# Patient Record
Sex: Male | Born: 1940
Health system: Southern US, Community
[De-identification: ages and names within clinical notes are randomized; demographics above are authoritative.]

## PROBLEM LIST (undated history)

## (undated) DIAGNOSIS — N529 Male erectile dysfunction, unspecified: Secondary | ICD-10-CM

## (undated) DIAGNOSIS — D332 Benign neoplasm of brain, unspecified: Secondary | ICD-10-CM

## (undated) DIAGNOSIS — G610 Guillain-Barre syndrome: Secondary | ICD-10-CM

## (undated) DIAGNOSIS — E785 Hyperlipidemia, unspecified: Secondary | ICD-10-CM

## (undated) DIAGNOSIS — I639 Cerebral infarction, unspecified: Secondary | ICD-10-CM

## (undated) DIAGNOSIS — Z8639 Personal history of other endocrine, nutritional and metabolic disease: Secondary | ICD-10-CM

## (undated) DIAGNOSIS — I4891 Unspecified atrial fibrillation: Secondary | ICD-10-CM

## (undated) DIAGNOSIS — N183 Chronic kidney disease, stage 3 unspecified: Secondary | ICD-10-CM

## (undated) DIAGNOSIS — D649 Anemia, unspecified: Secondary | ICD-10-CM

## (undated) DIAGNOSIS — I499 Cardiac arrhythmia, unspecified: Secondary | ICD-10-CM

## (undated) DIAGNOSIS — R413 Other amnesia: Secondary | ICD-10-CM

## (undated) DIAGNOSIS — K219 Gastro-esophageal reflux disease without esophagitis: Secondary | ICD-10-CM

## (undated) DIAGNOSIS — I10 Essential (primary) hypertension: Secondary | ICD-10-CM

## (undated) DIAGNOSIS — F0390 Unspecified dementia without behavioral disturbance: Secondary | ICD-10-CM

## (undated) DIAGNOSIS — K5790 Diverticulosis of intestine, part unspecified, without perforation or abscess without bleeding: Secondary | ICD-10-CM

## (undated) DIAGNOSIS — I6529 Occlusion and stenosis of unspecified carotid artery: Secondary | ICD-10-CM

## (undated) DIAGNOSIS — I679 Cerebrovascular disease, unspecified: Secondary | ICD-10-CM

## (undated) HISTORY — DX: Essential (primary) hypertension: I10

## (undated) HISTORY — DX: Guillain-Barre syndrome: G61.0

## (undated) HISTORY — PX: INGUINAL HERNIA REPAIR: SUR1180

## (undated) HISTORY — DX: Cerebral infarction, unspecified: I63.9

## (undated) HISTORY — DX: Hyperlipidemia, unspecified: E78.5

## (undated) HISTORY — DX: Benign neoplasm of brain, unspecified: D33.2

## (undated) HISTORY — PX: TONSILLECTOMY: SUR1361

## (undated) HISTORY — DX: Other amnesia: R41.3

## (undated) HISTORY — DX: Diverticulosis of intestine, part unspecified, without perforation or abscess without bleeding: K57.90

## (undated) HISTORY — DX: Occlusion and stenosis of unspecified carotid artery: I65.29

## (undated) HISTORY — DX: Cerebrovascular disease, unspecified: I67.9

## (undated) HISTORY — DX: Male erectile dysfunction, unspecified: N52.9

## (undated) HISTORY — DX: Personal history of other endocrine, nutritional and metabolic disease: Z86.39

## (undated) HISTORY — DX: Chronic kidney disease, stage 3 (moderate): N18.3

## (undated) HISTORY — PX: EYE SURGERY: SHX253

## (undated) HISTORY — DX: Chronic kidney disease, stage 3 unspecified: N18.30

---

## 1969-01-15 HISTORY — PX: INGUINAL HERNIA REPAIR: SUR1180

## 1996-05-17 DIAGNOSIS — D332 Benign neoplasm of brain, unspecified: Secondary | ICD-10-CM

## 1996-05-17 HISTORY — DX: Benign neoplasm of brain, unspecified: D33.2

## 1996-05-17 HISTORY — PX: BRAIN SURGERY: SHX531

## 1997-05-17 DIAGNOSIS — G610 Guillain-Barre syndrome: Secondary | ICD-10-CM

## 1997-05-17 HISTORY — DX: Guillain-Barre syndrome: G61.0

## 1998-01-23 ENCOUNTER — Encounter: Payer: Self-pay | Admitting: Neurology

## 1998-01-23 ENCOUNTER — Inpatient Hospital Stay (HOSPITAL_COMMUNITY): Admission: AD | Admit: 1998-01-23 | Discharge: 1998-01-29 | Payer: Self-pay | Admitting: Neurology

## 2000-03-11 ENCOUNTER — Ambulatory Visit (HOSPITAL_COMMUNITY): Admission: RE | Admit: 2000-03-11 | Discharge: 2000-03-11 | Payer: Self-pay | Admitting: Gastroenterology

## 2000-03-11 ENCOUNTER — Encounter (INDEPENDENT_AMBULATORY_CARE_PROVIDER_SITE_OTHER): Payer: Self-pay | Admitting: *Deleted

## 2000-03-17 HISTORY — PX: CHOLECYSTECTOMY: SHX55

## 2000-03-30 ENCOUNTER — Encounter: Admission: RE | Admit: 2000-03-30 | Discharge: 2000-03-30 | Payer: Self-pay | Admitting: General Surgery

## 2000-03-30 ENCOUNTER — Encounter: Payer: Self-pay | Admitting: General Surgery

## 2000-04-11 ENCOUNTER — Encounter: Payer: Self-pay | Admitting: General Surgery

## 2000-04-14 ENCOUNTER — Encounter (INDEPENDENT_AMBULATORY_CARE_PROVIDER_SITE_OTHER): Payer: Self-pay | Admitting: Specialist

## 2000-04-14 ENCOUNTER — Inpatient Hospital Stay (HOSPITAL_COMMUNITY): Admission: RE | Admit: 2000-04-14 | Discharge: 2000-04-18 | Payer: Self-pay | Admitting: General Surgery

## 2001-05-17 DIAGNOSIS — I639 Cerebral infarction, unspecified: Secondary | ICD-10-CM

## 2001-05-17 HISTORY — DX: Cerebral infarction, unspecified: I63.9

## 2001-06-17 HISTORY — PX: COLON SURGERY: SHX602

## 2001-07-14 ENCOUNTER — Encounter (INDEPENDENT_AMBULATORY_CARE_PROVIDER_SITE_OTHER): Payer: Self-pay | Admitting: Specialist

## 2001-07-14 ENCOUNTER — Ambulatory Visit (HOSPITAL_COMMUNITY): Admission: RE | Admit: 2001-07-14 | Discharge: 2001-07-14 | Payer: Self-pay | Admitting: Gastroenterology

## 2001-09-09 ENCOUNTER — Inpatient Hospital Stay (HOSPITAL_COMMUNITY): Admission: EM | Admit: 2001-09-09 | Discharge: 2001-09-14 | Payer: Self-pay | Admitting: *Deleted

## 2001-09-09 ENCOUNTER — Encounter (INDEPENDENT_AMBULATORY_CARE_PROVIDER_SITE_OTHER): Payer: Self-pay | Admitting: Specialist

## 2001-09-09 ENCOUNTER — Encounter: Payer: Self-pay | Admitting: Emergency Medicine

## 2001-09-09 DIAGNOSIS — I679 Cerebrovascular disease, unspecified: Secondary | ICD-10-CM

## 2001-09-09 HISTORY — DX: Cerebrovascular disease, unspecified: I67.9

## 2001-09-13 HISTORY — PX: CAROTID ENDARTERECTOMY: SUR193

## 2001-10-04 ENCOUNTER — Encounter: Admission: RE | Admit: 2001-10-04 | Discharge: 2002-01-02 | Payer: Self-pay | Admitting: Internal Medicine

## 2007-06-09 ENCOUNTER — Ambulatory Visit: Payer: Self-pay | Admitting: Vascular Surgery

## 2008-06-14 ENCOUNTER — Ambulatory Visit: Payer: Self-pay | Admitting: Vascular Surgery

## 2009-05-23 ENCOUNTER — Ambulatory Visit: Payer: Self-pay | Admitting: Vascular Surgery

## 2009-07-04 ENCOUNTER — Encounter: Admission: RE | Admit: 2009-07-04 | Discharge: 2009-07-04 | Payer: Self-pay | Admitting: Vascular Surgery

## 2009-07-04 ENCOUNTER — Ambulatory Visit: Payer: Self-pay | Admitting: Vascular Surgery

## 2010-07-08 ENCOUNTER — Other Ambulatory Visit (INDEPENDENT_AMBULATORY_CARE_PROVIDER_SITE_OTHER): Payer: Medicare Other

## 2010-07-08 ENCOUNTER — Ambulatory Visit: Payer: Self-pay | Admitting: Vascular Surgery

## 2010-07-08 ENCOUNTER — Other Ambulatory Visit: Payer: Self-pay

## 2010-07-08 ENCOUNTER — Ambulatory Visit (INDEPENDENT_AMBULATORY_CARE_PROVIDER_SITE_OTHER): Payer: Medicare Other | Admitting: Vascular Surgery

## 2010-07-08 DIAGNOSIS — Z48812 Encounter for surgical aftercare following surgery on the circulatory system: Secondary | ICD-10-CM

## 2010-07-08 DIAGNOSIS — I6529 Occlusion and stenosis of unspecified carotid artery: Secondary | ICD-10-CM

## 2010-07-09 NOTE — Assessment & Plan Note (Signed)
OFFICE VISIT  TORINO, GORDEN DOB:  Nov 04, 1940                                       07/08/2010 [id number]  The patient presents today for follow-up of extracranial cerebrovascular occlusive disease.  He is status post left carotid endarterectomy in April 2003.  He had been seen 1 year ago with a duplex suggesting some evidence of significant stenosis in his endarterectomy site in the left carotid.  He does have known right internal carotid artery occlusion. He underwent a CT angiogram last year showing that he had moderate 50% stenosis in his common carotid artery junction with the plaque.  He is here today for follow-up of this.  He has had no evidence of any symptoms, specifically no amaurosis fugax, transient ischemic attack or stroke.  He tends to be stable with no cardiac difficulty.  PHYSICAL EXAMINATION:  Well-developed, well-nourished white male in no acute distress.  I do not appreciate carotid bruits.  He has a well- healed left neck incision.  He has palpable pulses bilaterally.  His radial pulses are 2+ bilaterally.  Blood pressure 130/71, pulse 72, respirations 18.  He did undergo repeat carotid duplex and this shows no change from the study 1 year ago.  This does show elevated velocities.  I explained that he does have elevated velocities but these are the same as last year with presumed no change.  I again reviewed symptoms of carotid disease with him and he will notify me should this occur.  Otherwise we will see him again in 1 year with repeat duplex.    Rosetta Posner, M.D. Electronically Signed  TFE/MEDQ  D:  07/08/2010  T:  07/09/2010  Job:  [id number]  cc:   Delanna Ahmadi, M.D.

## 2010-07-14 NOTE — Procedures (Unsigned)
CAROTID DUPLEX EXAM  INDICATION:  Left carotid endarterectomy followup, right ICA known occlusion.  HISTORY: Diabetes:  No Cardiac:  No Hypertension:  Yes Smoking:  No Previous Surgery:  Left carotid endarterectomy 09/13/2001 CV History:  Currently asymptomatic Amaurosis Fugax No, Paresthesias No, Hemiparesis No                                      RIGHT             LEFT Brachial systolic pressure: Brachial Doppler waveforms: Vertebral direction of flow:                          Antegrade DUPLEX VELOCITIES (cm/sec) CCA peak systolic                                     M = 108, B = 123456 ECA peak systolic                                     [id number] ICA peak systolic                                     80 ICA end diastolic                                     24 PLAQUE MORPHOLOGY:                                    Mixed PLAQUE AMOUNT:                                        Moderate/severe PLAQUE LOCATION:                                      ECA, CCA  IMPRESSION: 1. Known occlusion of the right internal carotid artery. 2. Patent left carotid endarterectomy site with elevated velocity of     371 cm/sec noted at the proximal patch level of the left common     carotid artery. 3. No left internal carotid artery stenosis noted.      ___________________________________________ Rosetta Posner, M.D.  EM/MEDQ  D:  07/09/2010  T:  07/09/2010  Job:  [id number]

## 2010-09-29 NOTE — Procedures (Signed)
CAROTID DUPLEX EXAM   INDICATION:  Follow up carotid artery disease.   HISTORY:  Diabetes:  No.  Cardiac:  No.  Hypertension:  Yes.  Smoking:  Previous.  Previous Surgery:  Left carotid endarterectomy on 09/13/01.  CV History:  Currently asymptomatic.  Amaurosis Fugax No, Paresthesias No, Hemiparesis No.                                       RIGHT             LEFT  Brachial systolic pressure:         116               122  Brachial Doppler waveforms:         Normal            Normal  Vertebral direction of flow:        Antegrade         Antegrade  DUPLEX VELOCITIES (cm/sec)  CCA peak systolic                   108               0000000  ECA peak systolic                   201               0000000  ICA peak systolic                   Occluded          [id number]  ICA end diastolic                                     18  PLAQUE MORPHOLOGY:                  Mixed             Mixed  PLAQUE AMOUNT:                      Occlusive         Moderate  PLAQUE LOCATION:                    ICA/ECA/CCA       ECA/CCA   IMPRESSION:  1. Known occlusion of the right internal carotid artery.  2. Doppler velocities suggest a 40-59% stenosis of the left internal      carotid artery; however, no focal plaque formation or flow      narrowing is noted.  This increase in velocity may be due to      compensatory flow.  3. Significantly increased velocity of the left distal common carotid      artery noted.  4. No significant change noted when compared to the previous      examination on 06/09/07.   ___________________________________________  Rosetta Posner, M.D.   CH/MEDQ  D:  06/14/2008  T:  06/14/2008  Job:  [id number]   cc:   Delanna Ahmadi, M.D.

## 2010-09-29 NOTE — Assessment & Plan Note (Signed)
OFFICE VISIT   [name], [name]  DOB:  18-Mar-1941                                       05/23/2009  CHART#:[id number]   The patient returns today for followup of a left carotid endarterectomy  which was performed in 2003.  He is followed on a yearly basis.  At this  time he is complaining of no symptoms.   PAST MEDICAL HISTORY:  Hypertension, hypercholesterolemia, which are  stable.   SOCIAL HISTORY:  He is married and retired.  He has a remote history of  tobacco use.   REVIEW OF SYSTEMS:  Significant for a stroke 7 years prior.   PHYSICAL FINDINGS:  General:  Revealed a well-nourished man in no acute  distress.  Vital signs:  Heart rate was 66, blood pressure 121/67, O2  sat 98%, temperature 97.4.  HEENT:  PERRLA.  EOMI.  Lungs:  Were clear  to auscultation.  Neck:  I did appreciate a left carotid bruit.  Cardiac:  Revealed a regular rate and rhythm.  Musculoskeletal:  System  demonstrated no major deformities.  Neuro:  He was neurologically  nonfocal.   He did undergo Doppler evaluation of his carotids which demonstrated a  known occlusion of the right internal carotid artery.  The left carotid  artery demonstrated an elevated velocity at 354 cm/s noted at the  proximal patch level of the left common carotid artery.  No internal  carotid artery stenosis was appreciated.  This demonstrated a  significant increase in the velocity of the left common carotid artery  since his previous study on 06/14/2008.   MEDICAL DECISION MAKING:  This was discussed with Dr. Donnetta Hutching.  We felt  that he should undergo CTA of his neck to further evaluate his left  common carotid artery.  He will have this done and follow up with Dr.  Donnetta Hutching in the next 3-4 weeks.   Chad Cordial, PA   Rosetta Posner, M.D.  Electronically Signed   KEL/MEDQ  D:  05/23/2009  T:  05/24/2009  Job:  [id number]

## 2010-09-29 NOTE — Assessment & Plan Note (Signed)
OFFICE VISIT   [name], [name]  DOB:  Mar 30, 1941                                       07/04/2009  CHART#:[id number]   Patient presents today for continued follow-up of his asymptomatic  carotid disease.  He is status post left carotid endarterectomy for  symptomatic carotid disease in 2003.  He had undergone continued duplex  follow-up in our office and had recently shown progression to a higher  degree of narrowing in his left common carotid at the proximal portion  of the patch.  He has a known right internal carotid artery occlusion.  He has remained stable from a cardiac standpoint and is undergoing  treatment for hypertension and elevated cholesterol.  He continues to be  a nonsmoker, having quit in 1998.  Review of systems otherwise  unremarkable.   PHYSICAL EXAMINATION:  Blood pressure is 121/71, pulse 68, respirations  24.  In general, no distress and is well nourished.  HEENT is normal.  Chest is clear bilaterally.  His carotid arteries are without bruits  bilaterally.  Musculoskeletal:  There are no major deformities or  cyanosis.  Neurologic:  He is grossly intact.  Skin is without ulcers or  rashes.   He did undergo a CT angiogram today, and I have reviewed this with him.  This does show moderate stenosis at the proximal portion of his patch on  his common carotid artery and the junction with a proximal patch.  This  is not critical with reduction of narrowing at approximately 50%.   I have discussed this at length with patient and his wife.  I have  recommend that we continue to see him on a yearly basis now that we have  a comparison between the duplex and his current level of stenosis.  He  will notify us should he develop any neurologic deficits.     Rosetta Posner, M.D.  Electronically Signed   TFE/MEDQ  D:  07/04/2009  T:  07/07/2009  Job:  [id number]   cc:   Delanna Ahmadi, M.D.

## 2010-09-29 NOTE — Procedures (Signed)
CAROTID DUPLEX EXAM   INDICATION:  Carotid disease.   HISTORY:  Diabetes:  No.  Cardiac:  No.  Hypertension:  Yes.  Smoking:  Previous.  Previous Surgery:  Left carotid endarterectomy on 09/13/2001.  CV History:  Asymptomatic.  Amaurosis Fugax No, Paresthesias No, Hemiparesis No                                       RIGHT             LEFT  Brachial systolic pressure:         122               118  Brachial Doppler waveforms:         Normal            Normal  Vertebral direction of flow:        Antegrade         Antegrade  DUPLEX VELOCITIES (cm/sec)  CCA peak systolic                   73                [id number]  ECA peak systolic                   220               [id number]  ICA peak systolic                   Occluded          66  ICA end diastolic                                     15  PLAQUE MORPHOLOGY:                  Mixed             Mixed  PLAQUE AMOUNT:                      Occlusive         Moderate / severe  PLAQUE LOCATION:                    ICA / ECA / CCA   ECA / CCA   IMPRESSION:  1. Known occlusion of the right internal carotid artery.  2. Patent left carotid endarterectomy site with elevated velocity of      354 cm/s noted at the proximal patch level of the left common      carotid artery.  No left internal carotid artery stenosis is noted.  3. Significant increase in velocities of the left distal common      carotid artery when compared to previous exam on 06/14/2008 with      the right side remaining stable.   ___________________________________________  Rosetta Posner, M.D.   CH/MEDQ  D:  05/23/2009  T:  05/24/2009  Job:  [id number]

## 2010-09-29 NOTE — Procedures (Signed)
CAROTID DUPLEX EXAM   INDICATION:  Followup, carotid artery disease.   HISTORY:  Diabetes:  No.  Cardiac:  No.  Hypertension:  Yes.  Smoking:  Quit in 1998.  Previous Surgery:  Left carotid endarterectomy with saphenous vein patch  on 09/13/01 by Dr. Donnetta Hutching.  CV History:  Amaurosis Fugax No, Paresthesias No, Hemiparesis No                                       RIGHT             LEFT  Brachial systolic pressure:         110               120  Brachial Doppler waveforms:         Biphasic          Biphasic  Vertebral direction of flow:        Antegrade         Antegrade  DUPLEX VELOCITIES (cm/sec)  CCA peak systolic                   124               123456  ECA peak systolic                   182               [id number]  ICA peak systolic                   Occluded          [id number]  ICA end diastolic                                     14  PLAQUE MORPHOLOGY:                  Calcified         Mixed  PLAQUE AMOUNT:                      Large             Large  PLAQUE LOCATION:                    ICA, ECA          Proximal patch,  ECA   IMPRESSION:  1. Bilateral external carotid artery stenoses.  2. Known right internal carotid artery occlusion.  3. Elevated velocities in the proximal left patch were 323 cm/s peak      systolic and 44 cm/s end diastolic, which is stable from previous      examination.   ___________________________________________  [name], M.D.   DP/MEDQ  D:  06/09/2007  T:  06/09/2007  Job:  [id number]

## 2010-10-02 NOTE — Consult Note (Signed)
[place]. [facility]  Patient:    [name], [name] Visit Number: [id number] MRN: [id number]          Service Type: MED Location: [phone] Attending Physician:  Irven Shelling Dictated by:   Chauncey Cruel Jeannine Kitten, M.D. Admit Date:  09/09/2001 Discharge Date: 09/14/2001                            Consultation Report  BIRTHDATE:  [birth year]  PROCEDURE:  Interpretation of right common carotid arteriogram (intracranial, two views), left subclavian arteriogram (intracranial, two views), and left common carotid arteriogram (intracranial, two views).  Procedure was performed by Dr. Donnetta Hutching and Dr. Allean Found.  CLINICAL HISTORY:  Episode of right-sided weakness.  The patient has had prior surgery involving the left calvarium for what is reported as a benign brain lesion in 1998.  The present examination is limited with respect to interpretation at this level because of artifact.  Further delineation with CT or MR may be considered.  RIGHT COMMON CAROTID ARTERIOGRAM (INTRACRANIAL, TWO VIEWS):  As the right internal artery is occluded, only the right external carotid artery is filled initially.  Via collaterals from the ophthalmic region, there is attempted filling of the right internal carotid artery; however, this is minimal in degree.  LEFT SUBCLAVIAN ARTERIOGRAM (INTRACRANIAL, TWO VIEWS):  During the injection of the left subclavian artery, the basilar artery fills intracranially.  There is filling of the right middle cerebral artery via right posterior communicating artery.  LEFT COMMON CAROTID ARTERY (INTRACRANIAL, TWO VIEWS):  During injection of the left common carotid artery, there is initially filling of the left extracranial artery branches.  Eventually, there is slight filling of the left internal carotid artery, consistent with the high-grade proximal stenosis.  IMPRESSION: 1. Right internal carotid artery is occluded.  Minimal filling via  collaterals from the external carotid artery. With injection of the vertebral system, there is filling of right middle cerebral artery branches from the basilar artery via posterior communicating artery. 2. Left internal carotid artery with significant high-grade stenosis with delayed poor filling of the left internal carotid artery during left common carotid artery injection.  For further delineation of any suspected intracranial lesion in this patient who has had prior craniotomy, CT or MR recommended. Dictated by:   S. Jeannine Kitten, M.D. Attending Physician:  Irven Shelling DD:  09/12/01 TD:  09/13/01 Job: [id number]

## 2010-10-02 NOTE — Consult Note (Signed)
[place]. [facility]  Patient:    [name], [name] Visit Number: [id number] MRN: [id number]          Service Type: MED Location: [id number] Attending Physician:  Irven Shelling Dictated by:   Nelda Severe Kellie Simmering, M.D. Proc. Date: 09/11/01 Admit Date:  09/09/2001                            Consultation Report  CHIEF COMPLAINT:  Left brain transient ischemic attack 48 hours ago.  HISTORY OF PRESENT ILLNESS:  I appreciate seeing this 70 year old, Caucasian, male patient in consultation regarding his left brain TIA which occurred two days ago.  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  He denies any previous episodes of similar symptoms on either side and also denies episodes of amaurosis fugax or previous stroke.  He also had no chest pain, dyspnea, nausea, vomiting, or diaphoresis during these symptoms.  PAST MEDICAL HISTORY:  Significant for: 1. Previous benign brain tumor removed from the left hemisphere in 1998    (question meningioma). 2. Postoperative wound infection following benign brain tumor removal. 3. Guillain-Barre syndrome in 1999. 4. Colonic polyps removed endoscopically in the past. 5. Hypertension. 6. Increased lipids.  He denies a history of emphysema, asthma, coronary artery disease, previous myocardial infarction, or previous CVA.  MEDICATIONS: 1. Zocor 20 mg daily. 2. Norvasc 10 mg daily. 3. Aspirin 81 mg daily. 4. ______/hydrochlorothiazide 10/12.5 mg daily.  ALLERGIES:  Questionable HEPARIN, rash.  SOCIAL HISTORY:  The patient is disabled.  He has not smoked in five years. Prior to that he smoked a pack a day at least for the past 30 years.  Denies alcohol use.  FAMILY HISTORY:  His father died of a CVA at age 83.  His mother died of metastatic colon cancer.  PHYSICAL  EXAMINATION:  Blood pressure 140/68, heart rate 82, respirations 20.  GENERAL APPEARANCE:  He is is a healthy-appearing male patient who is in no apparent distress.  He is alert and oriented x 3.  NECK:  Supple with 3+ carotid pulses.  No palpable adenopathy in the neck.  NEUROLOGIC:  Normal.  EXTREMITIES:  Upper extremity pulses are 3+ bilaterally.  CHEST:  Clear to auscultation.  CARDIOVASCULAR:  Regular rhythm with no murmur.  ABDOMEN:  Soft and obese with no masses or tenderness.  EXTREMITIES:  3+ femoral pulses bilaterally.  The right popliteal and right distal pulses are not palpable.  The left leg has 2+ popliteal and a 1-2+ posterior tibial pulses with no evidence of ischemia.  LABORATORY DATA:  A CT scan at the time of admission revealed no acute changes with no evidence of any hemorrhage.  Carotid duplex exam done today reveals left internal carotid occlusion and severe right internal carotid stenosis versus distal occlusion.  IMPRESSION: 1. Left hemispheric transient ischemic attack secondary to left internal    carotid occlusion and severe right internal carotid stenosis. 2. Rule out heparin allergy. 3. History of left brain tumor, benign. 4. History of Guillain-Barre syndrome.  RECOMMENDATIONS: 1. Plan cerebral angiography tomorrow. 2. Needs a hematology consult today to determine the extent of heparin allergy    and what can be used surgically in place of heparin or if the heparin    allergy is confined only to beef or pork varieties.  I appreciate  seeing this consultation. Dictated by:   Nelda Severe Kellie Simmering, M.D. Attending Physician:  Irven Shelling DD:  09/11/01 TD:  09/11/01 Job: [id number]

## 2010-10-02 NOTE — Consult Note (Signed)
[place]. [facility]  Patient:    [name], [name] Visit Number: [id number] MRN: [id number]          Service Type: MED Location: [id number] Attending Physician:  [name] Dictated by:   [name], N.P. Proc. Date: 09/11/01 Admit Date:  09/09/2001   CC:         [name], M.D.  [name] [name], M.D.   Consultation Report  REFERRING PHYSICIAN:  Dr. Laurann Johnson  DATE OF BIRTH:  Oct 23, 1940  REASON FOR CONSULTATION:  Questionable heparin allergy.  HISTORY OF PRESENT ILLNESS:  The patient is a 70 year old gentleman who was admitted September 09, 2001, with a TIA and was found to have left ICA occlusion and probable Johnson right ICA stenosis in need of right CEA. He is scheduled for cerebral angiogram September 12, 2001. The patient reports a possible heparin allergy. He states that he experienced immediate facial flushing following a heparin flush to a PICC line in 1998. He reports the heparin was discontinued, and further flushes were performed with saline. He denies having any heparin since that possible reaction in 1998.  PAST MEDICAL HISTORY: 1. Benign brain tumor, status post resection March 1998. 2. Guillain Barre syndrome, 1999. 3. History of multiple colon polyps, status post right colectomy, November    2001 (colonoscopy February 2003 with four benign polyps removed). 4. Status post laparoscopic cholecystectomy, November 2001. 5. Hypertension. 6. Hyperlipidemia. 7. Diverticulosis. 8. Status post inguinal hernia repair in 1970s.  CURRENT MEDICATIONS: 1. Plavix 75 mg daily. 2. Zocor 20 mg daily. 3. Lisinopril 10 mg daily. 4. Hydrochlorothiazide 12.5 mg daily. 5. Norvasc 10 mg daily.  ALLERGIES:  Question to HEPARIN.  FAMILY HISTORY:  Mother deceased age 60 with colon cancer, father deceased with a stroke. He has no siblings.  SOCIAL HISTORY:  The patient lives in [place] with his wife. They have  one adopted son. The patient reports he is disabled. 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 He has a positive tobacco history reporting that he quit smoking in 1998 at two packs per day for approximately 35 years. He denies any ETOH use.  REVIEW OF SYSTEMS:  The patient denies any weight loss or anorexia. He denies any fatigue. He has had no fevers. He denies any pain. He has had no unusual headaches or vision changes. He denies any shortness of breath or cough. He has had no chest pain. He denies any peripheral edema. He has had no recent change in his bowel habits. He reports that he occasionally notices a small amount of bright red blood on toilet paper after a bowel movement. He reports that this has been occurring since his bowel surgery in November of 2001. He denies any melena. He has had no hematuria or dysuria.  PHYSICAL EXAMINATION:  VITAL SIGNS:  Temperature 97.1, heart rate 76, respirations 20, blood pressure 138/68. Oxygen saturation 97% on room air.  GENERAL:  Well-nourished Caucasian male in no acute distress.  HEENT:  Normocephalic, atraumatic. Pupils equal and reactive to light. Extraocular muscles are intact, sclerae anicteric. Oropharynx is clear.  NECK:  No bruit noted.  LYMPH:  No palpable cervical, supraclavicular, or axillary lymph nodes.  CHEST:  Lungs are clear bilaterally.  CARDIOVASCULAR:  Regular rate and rhythm, 2/6 systolic murmur, left sternal border.  ABDOMEN:  Soft and nontender.  EXTREMITIES:  No cyanosis, clubbing, or edema. DT pulse 2+ on the  left, not palpable on the right.  NEUROLOGIC:  Alert and oriented x3. Motor strength is 5/5. Coordination and rapid alternating movements normal.  LABORATORY DATA:  Hemoglobin 13.8, white count 10.4, platelets 202,000, sodium 139, potassium 4.1, BUN 19, creatinine 1.3, glucose 97, total bilirubin 0.8, alkaline phosphatase 90, SGOT 22, SGPT 27, total  protein 6.4, albumin 30.6, calcium 8.9. PT 13.7, PTT 33.  RADIOLOGY:  Head CT:  Evidence for previous left temporoparietal craniectomy with large area of encephalomalacia seen within the left frontal temporal region. No acute abnormalities.  IMPRESSION:  This case revolves around whether or not the patient has a true heparin allergy. He relays a history of flushing following a bolus heparin flush approximately five years ago. Vasospastic reactions have been described with heparin, usually arterial catheters and delayed by 7-10 days. Current heparin product is derived from pigs. Bovine product is no longer available. The event five years ago was not recorded on his Discharge Summary. There is no note of thrombocytopenia which is the more common heparin reaction. The episode occurred shortly after a PICC line was placed and may have been vasovagal. We have a low clinical suspicion for a true heparin allergy.  RECOMMENDATIONS: 1. Subcutaneous test dose of heparin 100 units. Monitoring vital signs every    20 minutes for one hour. If this is tolerated, then a small IV dose of    heparin (100 units). If this is tolerated, full dose heparin. 2. Would not use a direct thrombin inhibitor (i.e. lepirudin or argatroban)    to cover a cerebrovascular procedure since there is no way to reverse    drug for bleeding. 3. A possible alternative to heparin is low molecular weight dextran 40    125-250 mL IV over one hour every 12 hours.  Dr. Beryle Johnson discussed the above with the patient and his wife. He does not object to a test dose of heparin.  We will continue to follow with you. Dictated by:   [name], N.P. Attending Physician:  [name] DD:  09/13/01 TD:  09/13/01 Job: [id number]

## 2010-10-02 NOTE — Discharge Summary (Signed)
[place]. [facility]  Patient:    KENDRYCK, GALANTE Visit Number: [id number] MRN: [id number]          Service Type: MED Location: [phone] Attending Physician:  Irven Shelling Dictated by:   Delanna Ahmadi, M.D. Admit Date:  09/09/2001 Discharge Date: 09/14/2001   CC:         Rosetta Posner, M.D.   Discharge Summary  HISTORY OF PRESENT ILLNESS: The patient is a 70 year old white male who was admitted with an episode of right sided numbness and weakness of about 20 minutes in duration, presumably secondary to a left brain TIA.  PHYSICAL EXAM:  VITAL SIGNS: Blood pressure 148/68. Heart rate 82. Respirations 20. Temperature 97.1.  The remainder of physical examination including his neurologic examination was normal.  LABORATORY DATA: CBC revealed WBC 12.3, hemoglobin 14.6. Platelet count 209. PT 13.7, PTT 33. Chemistry revealed sodium of 139, potassium 4.1, chloride 104, bicarb 28, glucose 98, BUN 19, creatinine 1.3, calcium 8.9, total protein 6.4, albumin 3.6. AST 22, ALT 27, alkaphos 90, total bilirubin 0.8  DIAGNOSTIC STUDIES: CT scan of the brain showed a previous left temporal parietal craniectomy with a large area of encephalomalacia but no acute abnormalities.  EKG was normal sinus rhythm.  HOSPITAL COURSE:  #1 - LEFT BRAIN TIA: The patient was admitted with a left brain TIA. He was placed on aspirin and Plavix.  A carotid ultrasound was done on September 11, 2001 and showed possible complete occlusion of the left internal carotid artery and near total occlusion of the right internal carotid artery. An echocardiogram was done on the same day and showed normal LV function, mild MR, and mild increase in left ventricular wall thickness.  Vascular surgery was consulted. There was concern of a possible reaction to Heparin in the past, so Dr. Beryle Beams of Hematology was consulted also. Cerebral angiogram was done and showed complete  occlusion of the right internal carotid artery and a greater than 95% stenosis in the left internal carotid artery. A left carotid endarterectomy was recommended by CVTS. Dr. Beryle Beams recommended a Heparin challenge with small doses of subcuticular and then IV Heparin which were given to the patient without adverse reaction. So the patient was able to be treated with Heparin at the time of surgery. On September 13, 2001, the patient underwent a left carotid endarterectomy with vein patch angioplasty, SVG from left lower leg by Dr. Donnetta Hutching. He had some postop hypotension which was treated with IV fluids and albumin and resolved. He had no evidence of any neurologic deficit postoperatively and has done well. He was discharged in good condition.  DISCHARGE DIAGNOSES: 1. Left brain transischemic attack. 2. Cerebrovascular disease. 3. Hypertension. 4. History of meningioma. 5. History of Guillain-Barre syndrome. 6. Hyperlipidemia.  PROCEDURES: 1. CT scan of the brain. 2. Carotid ultrasound. 3. Transthoracic echocardiogram. 4. Cerebral angiogram. 5. Left carotid endarterectomy with vein patch angioplasty.  DISCHARGE MEDICATIONS: 1. Zocor 20 mg q. pm. 2. Lisinopril/HCTZ 10/12.5 mg q.d. 3. Norvasc 10 mg q.d. 4. Aspirin 81 mg per day. 5. Plavix 75 mg per day.  DISCHARGE DIET: Low sodium.  ACTIVITY: As tolerated.  FOLLOW-UP: In two to three weeks with CVTS and two weeks with Dr. Laurann Montana. Dictated by:   Delanna Ahmadi, M.D. Attending Physician:  Irven Shelling DD:  09/14/01 TD:  09/16/01 Job: [id number]

## 2010-10-02 NOTE — Op Note (Signed)
[place] [facility]  Patient:    RICKARD, [name]                   MRN: [id number] Proc. Date: 04/14/00 Adm. Date:  [id number] Attending:  Barbera Setters CC:         Delanna Ahmadi, M.D.  Alyson Locket. Sammuel Cooper, M.D.   Operative Report  PREOPERATIVE DIAGNOSES: 1. Villous adenoma of the cecum. 2. Gallstones.  POSTOPERATIVE DIAGNOSES: 1. Villous adenoma of the cecum. 2. Gallstones.  OPERATIONS PERFORMED: 1. Laparoscopic-assisted right colectomy with ileocolic anastomosis. 2. Laparoscopic cholecystectomy.  SURGEON:  Edsel Petrin. Dalbert Batman, M.D.  FIRST ASSISTANT:  Odis Hollingshead, M.D.  OPERATIVE INDICATIONS:  This is a 70 year old white man who has multiple medical problems, including hypertension, Guillain-Barre syndrome, and a history of craniotomy for meningioma.  He was found to have colon polyps recently.  He had a full colonoscopy by Alyson Locket. Sammuel Cooper, M.D., which showed numerous polyps in the sigmoid colon, descending colon, transverse colon, and right colon.  All of the polyps in the transverse, descending, and sigmoid colon were snared and removed and were all adenomatous polyps.  The only polyp that could not be removed was a 3 cm sessile polyp in the cecum, which was too large and too sessile for a polypectomy.  A couple of other polyps in the ascending colon were apparently left behind.  The patient was referred for colectomy.  A preoperative CT scan showed multiple gallstones, no mass in the abdomen, and a 5 mm cystic lesion in the right lobe of the liver, which was felt to be benign.  The patient was brought to the operating room for attempt at laparoscopic colectomy and laparoscopic cholecystectomy.  OPERATIVE FINDINGS:  The patient was found to have four polyps in the cecum and proximal ascending colon.  Three of these polyps were spherical and about 5 mm in diameter.  Another polyp was about 2 cm long x about 6 mm wide and  was immediately adjacent to one of the other polyps, making it look like a larger polyp.  No other polyps were identified.  The liver and gallbladder appeared normal, except for some adhesions to the gallbladder.  There were multiple stones within the gallbladder.  The stomach and duodenum, large intestine, and small intestine were normal.  The omentum was quite large.  The mesentery of the terminal ileum was somewhat thickened, but benign in appearance.  The peritoneal surfaces were smooth and normal and there was no ascites.  OPERATIVE TECHNIQUE:  Following the induction of general endotracheal anesthesia, a Foley catheter was inserted and an orogastric tube was inserted. Marcaine 0.5% with epinephrine was used as a local infiltration anesthetic.  A transverse incision was made just above the upper rim of the umbilicus.  The fascia was incised in the midline and the abdominal cavity entered under direct vision.  A 10 mm Hasson trocar was inserted and secured with a pursestring suture of 0 Vicryl.  A pneumoperitoneum was created.  A video camera was inserted with visualization and findings as described above.  A 10 mm trocar was placed in the subxiphoid region just to the left of the falciform ligament.  Two 5 mm trocars were placed in the right mid abdomen and a 5 mm trocar placed in the left suprapubic area.  After surveying the abdomen, we elected to perform the cholecystectomy first.  The patient was positioned.  The gallbladder was placed on traction.  We dissected the peritoneum  off of the gallbladder.  We dissected out the cystic duct and the cystic artery.  We very carefully dissected the entire length of the cystic duct, creating a nice window behind the gallbladder.  We clearly identified the junction of the cystic duct with the gallbladder and clearly identified the region of the common bile duct.  The cystic duct was then secured with metal clips and divided.  The cystic  artery was secured with metal clips and divided.  The gallbladder was dissected from its bed with electrocautery and removed.  The operative field was irrigated.  Hemostasis was excellent in the bed of the gallbladder.  At the completion of this dissection and at the completion of the case, there was no bleeding and no bile whatsoever from the gallbladder bed.  The patient was then repositioned in Trendelenburg position.  We identified the terminal ileum, the appendix, the cecum, and the right colon.  Using the harmonic scalpel, we divided the lateral peritoneal attachments of the right colon, cecum, appendix, and terminal ileum.  With blunt dissection and using the ultrasonic dissector, we mobilized these structures off of the retroperitoneum to the midline.  Dissection was carried up and around the hepatic flexure.  We had good visualization of the dissection plane between the hepatic flexure of the colon and Gerotas fascia.  The right ureter and duodenum were not injured.  We never truly saw the ureter, but the duodenum looked fine.  After mobilizing the terminal ileum, right colon, and hepatic flexure, we felt that we had enough mobilization to bring the colon up through a wound.  The instruments were removed.  The pneumoperitoneum was released. We removed the trocar at the umbilicus.  A transverse incision was made about 6 cm in length starting just above the umbilicus and extending to the right lateral side.  The abdominal wall was traversed with electrocautery, dividing the medial rectus muscles with electrocautery.  The abdomen was entered. Retractors were placed.  We were able to easily pull the terminal ileum and right colon up into the wound quite nicely.  We palpated the cecum and could feel a palpable polyp.  We transected the terminal ileum about 4 inches proximal to the ileocecal valve using the GIA stapling device.  We transected the ascending colon just proximal to the  hepatic flexure.  Mesenteric vessels  were isolated, clamped, divided, and ligated with 2-0 silk ties.  Larger vessels were suture ligated with 2-0 silk suture ligatures.  We kept the mesenteric dissection very close to the colon wall to avoid injury to any retroperitoneal structures and because the disease process was felt to be benign.  We took the specimen to the side table and opened it up.  We found the four polyps in it that were described above.  We felt that this was consistent with the colonoscopic findings.  We felt that we had resected the disease process.  An anastomosis was created between the terminal ileum and the hepatic flexure of the colon using a GIA stapling device.  The defect left in the bowel wall was closed with a TA60 stapling device.  This provided a very secure closure. The bowel was pink and healthy on both sides, but there was no bleeding.  We then changed our instruments and gloves.  We closed the mesentery with a few interrupted sutures of 3-0 silk.  We irrigated the intestine and it looked fine.  We dropped the intestine back into the abdominal cavity.  The transverse lumen  was closed in layers.  The posterior rectus sheath was closed with a running suture of 1-0 PDS and the anterior rectus sheath was closed with a separate running suture of 1-0 PDS.  The wound was irrigated with saline and the skin was closed with skin staples.  Pneumoperitoneum was reestablished.  We positioned the patient in several positions and irrigated the subphrenic space, subhepatic space, right pericolic gutter, and pelvis with about 2 L of saline.  There was no bleeding whatsoever.  The anastomosis looked good and healthy.  We removed all of our trocars under direct vision.  There was no bleeding from the trocar sites.  The pneumoperitoneum was released.  The fascia had already been closed.  The skin incisions were closed with skin staples and Steri-Strips.  Clean bandages  were placed and the patient was taken to the recovery room in stable condition.  The estimated blood loss was about 100 cc or less.  No complications.  The sponge, needle, and instrument counts were correct. DD:  04/14/00 TD:  04/14/00 Job: [id number]

## 2010-10-02 NOTE — Op Note (Signed)
[place]. [facility]  Patient:    [name], [name] Visit Number: [id number] MRN: [id number]          Service Type: MED Location: [place] 09 Attending Physician:  Irven Shelling Dictated by:   Lilia Argue Servando Snare, M.D. Proc. Date: 09/13/01 Admit Date:  09/09/2001                             Operative Report  PREOPERATIVE DIAGNOSIS:  Symptomatic left internal carotid artery stenosis.  POSTOPERATIVE DIAGNOSIS:  Symptomatic left internal carotid artery stenosis.  OPERATION PERFORMED:  Carotid endarterectomy and saphenous vein patch angioplasty.  SURGEON:  Rosetta Posner, M.D.  ASSISTANT: 1. Nelda Severe. Kellie Simmering, M.D. 2. Rosealee Albee. Theda Sers, P.A.  ANESTHESIA:  General endotracheal.  COMPLICATIONS:  None.  DISPOSITION:  To recovery room neurologically intact.  DESCRIPTION OF PROCEDURE:  The patient was taken to the operating room and placed in supine position where the area of the left neck and right distal calf and ankle were prepped and draped in the usual sterile fashion.  Incision was made in the anterior sternocleidomastoid and carried down through the platysma with electrocautery.  The sternocleidomastoid reflected posteriorly and the facial vein was ligated with 2-0 silk ties and divided.  The common carotid artery was encircled with umbilical tape and Rumel tourniquet and dissection extended up onto the carotid bifurcation.  The superior thyroid artery was encircled with a 2-0 Potts silk tie.  The external carotid artery was encircled with a blue vessel loop.  The internal carotid artery was encircled with an umbilical tape and Rumel tourniquet.  A spinal accessory, hypoglossal and vagus nerves were all identified and preserved.  The right saphenous vein was then exposed at the ankle and harvested over approximately 6 cm.  This wound was closed with 3-0 and 4-0 subcuticular Vicryl sutures. The patient was then given 7000 units of intravenous  heparin.  After adequate circulation time, the internal, external and common carotid arteries were occluded.  The common carotid artery was opened with an 11 blade and extended longitudinally with Potts scissors through the plaque onto the internal carotid.  Arteriogram showed subtotal occlusion of the internal carotid with the small internal carotid artery above this.  This was a small caliber but normal-appearing artery and was gently dilated.  An 8 shunt was passed up the internal carotid artery, allowed to back-bleed, then down the common carotid where it was secured with a Rumel tourniquet.  Endarterectomy was begun on the common carotid and the plaque was divided proximally with Potts scissors. The endarterectomy was extended on to the bifurcation, the external carotid artery was endarterectomized with eversion technique and the internal carotid artery was endarterectomized in an open fashion.  The remaining atheromatous debris was removed from the endarterectomy plane.  The saphenous vein was used as a vein patch angioplasty with a running 6-0 Prolene suture.  Prior to completion of the anastomosis, the shunt was removed and the usual ____________ deairing maneuvers were undertaken.  The anastomosis was then completed and the external, followed by the common, followed by the internal carotid artery occlusion clamps were removed.  Excellent flow characteristics were noted with hand-held Doppler in the internal and external carotid arteries.  The patient was given 50 mg of Protamine to reverse the heparin.  The wounds were irrigated and hemostasis electrocautery.  Wounds closed with 3-0 Vicryl in the subcutaneous, subcuticular tissue.  Benzoin and Steri-Strips  were applied. The patient was awakened in the operating room neurologically intact and was transferred to the recovery room in stable condition. Dictated by:   Lilia Argue Servando Snare, M.D. Attending Physician:  Irven Shelling DD:  09/13/01 TD:  09/13/01 Job: [id number]

## 2010-10-02 NOTE — Discharge Summary (Signed)
[facility]  Patient:    [name], [name]                      MRN: [id number] Adm. Date:  04/14/00 Disc. Date: 04/18/00 Attending:  Edsel Petrin. Dalbert Batman, M.D. CC:         Delanna Ahmadi, M.D.  Alyson Locket. Sammuel Cooper, M.D.   Discharge Summary  FINAL DIAGNOSES: 1. Multiple adenomatous polyps of the cecum with benign villous features. 2. Chronic cholecystitis with cholelithiasis. 3. Hypertension. 4. History of Guillain Barre syndrome. 5. History of left craniotomy from an angioma.  OPERATION PERFORMED:  Laparoscopically assisted right colectomy and laparoscopic cholecystectomy on April 14, 2000.  HISTORY OF PRESENT ILLNESS:  This is a 70 year old white male with multiple medical problems as listed above. Recent screening colonoscopy showed numerous polyps in the sigmoid colon, descending colon, transverse colon, and right colon. All of these polyps were removed except for some polyps in the cecum which were large and sessile and could not be excised. The patient was sent to me by Dr. Cletis Athens and Dr. Lavone Orn for segmental colon resection because of the villous polyps in his cecum. He was felt to be a good candidate for laparoscopic colectomy. CT scan was done preoperatively and showed numerous gallstones and otherwise the CT scan looked fine. He underwent bowel prep at home and was brought to the hospital electively for his colectomy.  For details of his past history, family history, social history and review of systems, please see the detailed admission note.  PHYSICAL EXAMINATION:  GENERAL:  Over weight, middle aged gentleman in no distress.  HEENT:  Sclera clear, extraocular movements intact.  NECK:  Supple, nontender, no mass. Possibility of a faint left carotid bruit.  LUNGS:  Clear to auscultation.  HEART:  Regular rate and rhythm, no murmur.  ABDOMEN:  Somewhat obese, soft, nontender, no mass, no hernia.  EXTREMITIES:  No  edema. Femoral pulses easily felt. Pedal pulses not easily felt.  HOSPITAL COURSE:  On the day of admission, the patient was taken to the operating room. He underwent a laparoscopic right colectomy and a laparoscopic cholecystectomy. We found 4 polyps in the cecum, 3 small ones and 1 large one consistent with the colonoscopic findings. The gallbladder was full of gallstones and chronically inflamed.  Final pathology report showed 4 adenomatous polyps with focal villous features but no dysplasia or carcinoma noted. The appendix showed no tumor. Mesenteric lymph nodes were benign. The gallbladder showed chronic cholecystitis and cholelithiasis.  Postoperatively, the patient did extremely well. He progressed in his diet and activities rapidly. He was able to be discharged on the fourth postoperative day.  On April 18, 2000, he was discharged tolerating a regular diet and was having bowel movements, denied pain, wanted to go home. His abdomen was soft and his wound was healing uneventfully.  DISCHARGE MEDICATIONS:  Basically included his usual medications which are Norvasc 10 mg a day, Prinzide 10/12.5 1 tablet a day, and Tylenol for pain.  He was advised to return to see me in the office in 2 weeks. He was advised that he needs follow-up colonoscopy with Dr. Sammuel Cooper in 1 year. DD:  05/12/00 TD:  05/13/00 Job: [id number]

## 2010-10-02 NOTE — Procedures (Signed)
[place]. [facility]  Patient:    [name], [name] Visit Number: [id number] MRN: [id number]          Service Type: MED Location: [id number] Attending Physician:  Irven Shelling Dictated by:   Rosetta Posner, M.D. Proc. Date: 09/12/01 Admit Date:  09/09/2001   CC:         Nelda Severe. Kellie Simmering, M.D.   Procedure Report  PREOPERATIVE DIAGNOSIS:  Severe extracranial cerebrovascular occlusive disease.  POSTOPERATIVE DIAGNOSIS:  Severe extracranial cerebrovascular occlusive disease.  PROCEDURE:  Arch and cerebral arteriogram.  SURGEON:  Rosetta Posner, M.D.  ASSISTANT:  Mechele Collin, M.D.  ANESTHESIA:  MAC.  COMPLICATIONS:  None.  DISPOSITION:  To holding area stable.  DESCRIPTION OF PROCEDURE:  The patient was taken to the peripheral vascular catheterization lab and placed in the supine position, where the area of both the right and left groins was prepped and draped in the usual sterile fashion. Using local anesthesia and a single wall puncture, the right common femoral artery was entered and a guidewire was passed to the level of the aortic bifurcation.  A 5 French sheath was passed over this and using a pigtail catheter positioned over a Wholey wire, and this was placed in the ascending arch.  AP projections were undertaken.  This revealed a widely patent innominate, left carotid, and left subclavian takeoff.  The patient had a large dominant left vertebral and a moderate-sized right vertebral artery. Next using a Headhunter catheter, the innominate and then the right common carotid artery was selectively catheterized.  Carotid and intracranial and cervical injections were undertaken through the right carotid system.  This revealed a completely occluded internal carotid artery on the right.  There were poor extracranial and intracranial collaterals.  Initial interpretation of the intracranial views will be dictated in a separate note by  [facility].  There was a large external carotid with no evidence of stenosis. Next the Headhunter catheter was removed from the right common carotid over a Wholey wire.  The left subclavian artery was then selectively catheterized, and the left vertebral takeoff was viewed.  This was widely patent, and there was a dominant left vertebral artery.  Intracranial views through this left subclavian injection showed good cross-filling of the posterior circulation through the vertebral flow.  Next the left carotid, common carotid artery was selectively catheterized with the Headhunter catheter as well.  There was extensive plaque at the bifurcation and a subtotal occlusion at the bifurcation.  There was a small internal carotid artery that was patent above this level and did continue into the intracranial portion of the circulation. There were good extracranial to intracranial collaterals on the left injection, and these actually filled on the cine run before the antegrade flow through the internal carotid artery on the left.  The patient tolerated the procedure without immediate complication and was transferred to the holding area in stable condition. Dictated by:   Rosetta Posner, M.D. Attending Physician:  Irven Shelling DD:  09/12/01 TD:  09/13/01 Job: [id number]

## 2011-07-09 ENCOUNTER — Other Ambulatory Visit (INDEPENDENT_AMBULATORY_CARE_PROVIDER_SITE_OTHER): Payer: Medicare Other | Admitting: *Deleted

## 2011-07-09 DIAGNOSIS — Z48812 Encounter for surgical aftercare following surgery on the circulatory system: Secondary | ICD-10-CM | POA: Diagnosis not present

## 2011-07-09 DIAGNOSIS — I6529 Occlusion and stenosis of unspecified carotid artery: Secondary | ICD-10-CM

## 2011-07-16 DIAGNOSIS — I10 Essential (primary) hypertension: Secondary | ICD-10-CM | POA: Diagnosis not present

## 2011-07-16 DIAGNOSIS — N529 Male erectile dysfunction, unspecified: Secondary | ICD-10-CM | POA: Diagnosis not present

## 2011-07-30 ENCOUNTER — Other Ambulatory Visit: Payer: Self-pay | Admitting: *Deleted

## 2011-07-30 DIAGNOSIS — I6529 Occlusion and stenosis of unspecified carotid artery: Secondary | ICD-10-CM

## 2011-07-30 NOTE — Procedures (Unsigned)
CAROTID DUPLEX EXAM  INDICATION:  Left carotid endarterectomy follow-up, known right ICA occlusion.  HISTORY: Diabetes:  No. Cardiac:  No. Hypertension:  Yes. Smoking:  No. Previous Surgery:  Left carotid endarterectomy 09/13/2001. CV History:  Asymptomatic at this time. Amaurosis Fugax No, Paresthesias No, Hemiparesis No                                      RIGHT             LEFT Brachial systolic pressure:         130               130 Brachial Doppler waveforms:         Triphasic         Biphasic Vertebral direction of flow:        Antegrade         Antegrade DUPLEX VELOCITIES (cm/sec) CCA peak systolic                   155               0000000 ECA peak systolic                   236               [id number] ICA peak systolic                   Known occlusion   [id number] ICA end diastolic                                     18 PLAQUE MORPHOLOGY:                  Mixed             Mixed PLAQUE AMOUNT:                      Occluded          Moderate/severe            PLAQUE LOCATION:  ICA   CCA distal/proximal patch.  Patch:  Left 416/53   IMPRESSION: 1. Known right internal carotid artery occlusion. 2. Patent left carotid endarterectomy site with narrowing visualized     with elevated velocity of 416/53, increased since prior exam of     07/08/2010. 3. No hemo-significantly left internal carotid artery stenosis,     increased velocities may be transferred from increased velocities     of patch. 4. Vertebral artery flow antegrade bilaterally.  ___________________________________________ Rosetta Posner, M.D.  SS/MEDQ  D:  07/09/2011  T:  07/09/2011  Job:  [id number]

## 2011-08-02 ENCOUNTER — Encounter: Payer: Self-pay | Admitting: Vascular Surgery

## 2012-01-20 DIAGNOSIS — I10 Essential (primary) hypertension: Secondary | ICD-10-CM | POA: Diagnosis not present

## 2012-01-20 DIAGNOSIS — E785 Hyperlipidemia, unspecified: Secondary | ICD-10-CM | POA: Diagnosis not present

## 2012-01-20 DIAGNOSIS — Z0000 Encounter for general adult medical examination without abnormal findings: Secondary | ICD-10-CM | POA: Diagnosis not present

## 2012-01-20 DIAGNOSIS — Z1331 Encounter for screening for depression: Secondary | ICD-10-CM | POA: Diagnosis not present

## 2012-01-28 DIAGNOSIS — Z87891 Personal history of nicotine dependence: Secondary | ICD-10-CM | POA: Diagnosis not present

## 2012-05-15 ENCOUNTER — Encounter: Payer: Self-pay | Admitting: Neurosurgery

## 2012-07-10 ENCOUNTER — Encounter: Payer: Self-pay | Admitting: Neurosurgery

## 2012-07-11 ENCOUNTER — Other Ambulatory Visit: Payer: Self-pay | Admitting: *Deleted

## 2012-07-11 ENCOUNTER — Encounter: Payer: Self-pay | Admitting: Neurosurgery

## 2012-07-11 ENCOUNTER — Other Ambulatory Visit (INDEPENDENT_AMBULATORY_CARE_PROVIDER_SITE_OTHER): Payer: Medicare Other | Admitting: *Deleted

## 2012-07-11 ENCOUNTER — Ambulatory Visit (INDEPENDENT_AMBULATORY_CARE_PROVIDER_SITE_OTHER): Payer: Medicare Other | Admitting: Neurosurgery

## 2012-07-11 VITALS — BP 119/69 | HR 65 | Resp 16 | Ht 71.0 in | Wt 225.0 lb

## 2012-07-11 DIAGNOSIS — I6521 Occlusion and stenosis of right carotid artery: Secondary | ICD-10-CM | POA: Insufficient documentation

## 2012-07-11 DIAGNOSIS — I6529 Occlusion and stenosis of unspecified carotid artery: Secondary | ICD-10-CM

## 2012-07-11 NOTE — Progress Notes (Signed)
[facility]  Carotid Office Note  CC: Carotid surveillance Referring Physician: Early  History of Present Illness: 72 year old male patient of Dr. Donnetta Hutching with a known right ICA occlusion and a history of left CEA in April 2003. The patient denies any signs or symptoms of CVA, TIA, amaurosis fugax. The patient denies any new medical diagnoses recent surgery.  Past Medical History  Diagnosis Date  . Hypertension   . Hyperlipidemia   . Carotid artery occlusion   . Stroke September 09, 2001    TIA, Left brain  . Guillain-Barre syndrome 1999  . Hx of elevated lipids   . Diverticulosis     ROS: [x]  Positive   [ ]  Denies    General: [ ]  Weight loss, [ ]  Fever, [ ]  chills Neurologic: [ ]  Dizziness, [ ]  Blackouts, [ ]  Seizure [ ]  Stroke, [ ]  "Mini stroke", [ ]  Slurred speech, [ ]  Temporary blindness; [ ]  weakness in arms or legs, [ ]  Hoarseness Cardiac: [ ]  Chest pain/pressure, [ ]  Shortness of breath at rest [ ]  Shortness of breath with exertion, [ ]  Atrial fibrillation or irregular heartbeat Vascular: [ ]  Pain in legs with walking, [ ]  Pain in legs at rest, [ ]  Pain in legs at night,  [ ]  Non-healing ulcer, [ ]  Blood clot in vein/DVT,   Pulmonary: [ ]  Home oxygen, [ ]  Productive cough, [ ]  Coughing up blood, [ ]  Asthma,  [ ]  Wheezing Musculoskeletal:  [ ]  Arthritis, [ ]  Low back pain, [ ]  Joint pain Hematologic: [ ]  Easy Bruising, [ ]  Anemia; [ ]  Hepatitis Gastrointestinal: [ ]  Blood in stool, [ ]  Gastroesophageal Reflux/heartburn, [ ]  Trouble swallowing Urinary: [ ]  chronic Kidney disease, [ ]  on HD - [ ]  MWF or [ ]  TTHS, [ ]  Burning with urination, [ ]  Difficulty urinating Skin: [ ]  Rashes, [ ]  Wounds Psychological: [ ]  Anxiety, [ ]  Depression   Social History History  Substance Use Topics  . Smoking status: Former Smoker    Types: Cigarettes    Quit date: 05/17/1996  . Smokeless tobacco: Never Used  . Alcohol Use: No    Family History Family  History  Problem Relation Age of Onset  . Cancer Mother     Metastatic Colon cancer  . Stroke Father     No Active Allergies  Current Outpatient Prescriptions  Medication Sig Dispense Refill  . clopidogrel (PLAVIX) 75 MG tablet daily.      . CRESTOR 40 MG tablet daily.      Marland Kitchen amLODipine (NORVASC) 10 MG tablet Take 10 mg by mouth daily.      Marland Kitchen aspirin 81 MG tablet Take 81 mg by mouth daily.      . benazepril-hydrochlorthiazide (LOTENSIN HCT) 10-12.5 MG per tablet Take 1 tablet by mouth daily.      . simvastatin (ZOCOR) 20 MG tablet Take 20 mg by mouth every evening.       No current facility-administered medications for this visit.    Physical Examination  Filed Vitals:   07/11/12 1405  BP: 119/69  Pulse: 65  Resp:     Body mass index is 31.39 kg/(m^2).  General:  WDWN in NAD Gait: Normal HEENT: WNL Eyes: Pupils equal Pulmonary: normal non-labored breathing , without Rales, rhonchi,  wheezing Cardiac: RRR, without  Murmurs, rubs or gallops; Abdomen: soft, NT, no masses Skin: no rashes, ulcers noted  Vascular Exam Pulses: 3+ radial pulses bilaterally Carotid bruits: Carotid  pulses to auscultation no bruits are Extremities without ischemic changes, no Gangrene , no cellulitis; no open wounds;  Musculoskeletal: no muscle wasting or atrophy   Neurologic: A&O X 3; Appropriate Affect ; SENSATION: normal; MOTOR FUNCTION:  moving all extremities equally. Speech is fluent/normal  Non-Invasive Vascular Imaging CAROTID DUPLEX 07/11/2012  Right ICA 0ccluded stenosis Left ICA 40 - 59 % stenosis   ASSESSMENT/PLAN: Asymptomatic patient with unchanged carotid duplex, and known right occlusion, there is a velocity of 437 cm/s CCA distal insignificance stenosis observed at the proximal patch. The patient will followup in one year with repeat carotid duplex. The patient's questions were encouraged and answered, he is in agreement with this plan.  Beatris Ship ANP   Clinic MD:  Kellie Simmering

## 2012-07-19 DIAGNOSIS — N529 Male erectile dysfunction, unspecified: Secondary | ICD-10-CM | POA: Diagnosis not present

## 2012-07-19 DIAGNOSIS — L989 Disorder of the skin and subcutaneous tissue, unspecified: Secondary | ICD-10-CM | POA: Diagnosis not present

## 2012-07-19 DIAGNOSIS — I10 Essential (primary) hypertension: Secondary | ICD-10-CM | POA: Diagnosis not present

## 2013-01-11 DIAGNOSIS — L909 Atrophic disorder of skin, unspecified: Secondary | ICD-10-CM | POA: Diagnosis not present

## 2013-01-11 DIAGNOSIS — C44519 Basal cell carcinoma of skin of other part of trunk: Secondary | ICD-10-CM | POA: Diagnosis not present

## 2013-01-11 DIAGNOSIS — L821 Other seborrheic keratosis: Secondary | ICD-10-CM | POA: Diagnosis not present

## 2013-01-11 DIAGNOSIS — L723 Sebaceous cyst: Secondary | ICD-10-CM | POA: Diagnosis not present

## 2013-02-06 DIAGNOSIS — Z1331 Encounter for screening for depression: Secondary | ICD-10-CM | POA: Diagnosis not present

## 2013-02-06 DIAGNOSIS — I10 Essential (primary) hypertension: Secondary | ICD-10-CM | POA: Diagnosis not present

## 2013-02-06 DIAGNOSIS — R7989 Other specified abnormal findings of blood chemistry: Secondary | ICD-10-CM | POA: Diagnosis not present

## 2013-02-06 DIAGNOSIS — E785 Hyperlipidemia, unspecified: Secondary | ICD-10-CM | POA: Diagnosis not present

## 2013-06-14 ENCOUNTER — Other Ambulatory Visit: Payer: Self-pay | Admitting: Neurosurgery

## 2013-06-14 DIAGNOSIS — I6529 Occlusion and stenosis of unspecified carotid artery: Secondary | ICD-10-CM

## 2013-06-14 DIAGNOSIS — Z48812 Encounter for surgical aftercare following surgery on the circulatory system: Secondary | ICD-10-CM

## 2013-07-17 ENCOUNTER — Ambulatory Visit (HOSPITAL_COMMUNITY)
Admission: RE | Admit: 2013-07-17 | Discharge: 2013-07-17 | Disposition: A | Discharge status: Home / Self Care | Payer: Medicare Other | Source: Ambulatory Visit | Attending: Vascular Surgery | Admitting: Vascular Surgery

## 2013-07-17 ENCOUNTER — Ambulatory Visit: Payer: Medicare Other | Admitting: Family

## 2013-07-17 DIAGNOSIS — I6529 Occlusion and stenosis of unspecified carotid artery: Secondary | ICD-10-CM | POA: Diagnosis not present

## 2013-07-17 DIAGNOSIS — Z48812 Encounter for surgical aftercare following surgery on the circulatory system: Secondary | ICD-10-CM

## 2013-07-17 DIAGNOSIS — Z9889 Other specified postprocedural states: Secondary | ICD-10-CM | POA: Diagnosis not present

## 2013-08-07 ENCOUNTER — Other Ambulatory Visit: Payer: Self-pay | Admitting: Internal Medicine

## 2013-08-07 DIAGNOSIS — R7301 Impaired fasting glucose: Secondary | ICD-10-CM | POA: Diagnosis not present

## 2013-08-07 DIAGNOSIS — Z23 Encounter for immunization: Secondary | ICD-10-CM | POA: Diagnosis not present

## 2013-08-07 DIAGNOSIS — E538 Deficiency of other specified B group vitamins: Secondary | ICD-10-CM | POA: Diagnosis not present

## 2013-08-07 DIAGNOSIS — I10 Essential (primary) hypertension: Secondary | ICD-10-CM | POA: Diagnosis not present

## 2013-08-07 DIAGNOSIS — N183 Chronic kidney disease, stage 3 unspecified: Secondary | ICD-10-CM | POA: Diagnosis not present

## 2013-08-07 DIAGNOSIS — R413 Other amnesia: Secondary | ICD-10-CM | POA: Diagnosis not present

## 2013-08-09 ENCOUNTER — Other Ambulatory Visit: Payer: Self-pay

## 2013-08-09 DIAGNOSIS — Z48812 Encounter for surgical aftercare following surgery on the circulatory system: Secondary | ICD-10-CM

## 2013-08-09 DIAGNOSIS — I6529 Occlusion and stenosis of unspecified carotid artery: Secondary | ICD-10-CM

## 2013-08-09 NOTE — Patient Instructions (Signed)

## 2013-08-10 ENCOUNTER — Encounter: Payer: Self-pay | Admitting: Vascular Surgery

## 2013-08-11 ENCOUNTER — Ambulatory Visit
Admission: RE | Admit: 2013-08-11 | Discharge: 2013-08-11 | Disposition: A | Discharge status: Home / Self Care | Payer: Medicare Other | Source: Ambulatory Visit | Attending: Internal Medicine | Admitting: Internal Medicine

## 2013-08-11 DIAGNOSIS — R221 Localized swelling, mass and lump, neck: Secondary | ICD-10-CM | POA: Diagnosis not present

## 2013-08-11 DIAGNOSIS — R413 Other amnesia: Secondary | ICD-10-CM

## 2013-08-11 DIAGNOSIS — R220 Localized swelling, mass and lump, head: Secondary | ICD-10-CM | POA: Diagnosis not present

## 2013-08-15 NOTE — Progress Notes (Signed)
Lab only 

## 2013-08-30 ENCOUNTER — Other Ambulatory Visit: Payer: Self-pay | Admitting: Neurosurgery

## 2013-08-30 DIAGNOSIS — D429 Neoplasm of uncertain behavior of meninges, unspecified: Secondary | ICD-10-CM

## 2013-08-30 DIAGNOSIS — Z6833 Body mass index (BMI) 33.0-33.9, adult: Secondary | ICD-10-CM | POA: Diagnosis not present

## 2013-09-06 DIAGNOSIS — H2510 Age-related nuclear cataract, unspecified eye: Secondary | ICD-10-CM | POA: Diagnosis not present

## 2013-09-17 DIAGNOSIS — E538 Deficiency of other specified B group vitamins: Secondary | ICD-10-CM | POA: Diagnosis not present

## 2013-11-19 ENCOUNTER — Ambulatory Visit
Admission: RE | Admit: 2013-11-19 | Discharge: 2013-11-19 | Disposition: A | Discharge status: Home / Self Care | Payer: Medicare Other | Source: Ambulatory Visit | Attending: Neurosurgery | Admitting: Neurosurgery

## 2013-11-19 DIAGNOSIS — D320 Benign neoplasm of cerebral meninges: Secondary | ICD-10-CM | POA: Diagnosis not present

## 2013-11-19 DIAGNOSIS — D429 Neoplasm of uncertain behavior of meninges, unspecified: Secondary | ICD-10-CM

## 2013-11-19 MED ORDER — GADOBENATE DIMEGLUMINE 529 MG/ML IV SOLN
20.0000 mL | Freq: Once | INTRAVENOUS | Status: AC | PRN
  Administered 2013-11-19: 20 mL via INTRAVENOUS

## 2013-11-27 ENCOUNTER — Encounter: Payer: Self-pay | Admitting: *Deleted

## 2013-11-29 DIAGNOSIS — D429 Neoplasm of uncertain behavior of meninges, unspecified: Secondary | ICD-10-CM | POA: Diagnosis not present

## 2013-11-29 DIAGNOSIS — Z6834 Body mass index (BMI) 34.0-34.9, adult: Secondary | ICD-10-CM | POA: Diagnosis not present

## 2014-01-22 DIAGNOSIS — D1801 Hemangioma of skin and subcutaneous tissue: Secondary | ICD-10-CM | POA: Diagnosis not present

## 2014-01-22 DIAGNOSIS — L918 Other hypertrophic disorders of the skin: Secondary | ICD-10-CM | POA: Diagnosis not present

## 2014-01-22 DIAGNOSIS — L570 Actinic keratosis: Secondary | ICD-10-CM | POA: Diagnosis not present

## 2014-01-22 DIAGNOSIS — Z85828 Personal history of other malignant neoplasm of skin: Secondary | ICD-10-CM | POA: Diagnosis not present

## 2014-01-22 DIAGNOSIS — L821 Other seborrheic keratosis: Secondary | ICD-10-CM | POA: Diagnosis not present

## 2014-01-22 DIAGNOSIS — L723 Sebaceous cyst: Secondary | ICD-10-CM | POA: Diagnosis not present

## 2014-01-22 DIAGNOSIS — L908 Other atrophic disorders of skin: Secondary | ICD-10-CM | POA: Diagnosis not present

## 2014-01-22 DIAGNOSIS — L819 Disorder of pigmentation, unspecified: Secondary | ICD-10-CM | POA: Diagnosis not present

## 2014-02-13 DIAGNOSIS — E669 Obesity, unspecified: Secondary | ICD-10-CM | POA: Diagnosis not present

## 2014-02-13 DIAGNOSIS — I10 Essential (primary) hypertension: Secondary | ICD-10-CM | POA: Diagnosis not present

## 2014-02-13 DIAGNOSIS — E782 Mixed hyperlipidemia: Secondary | ICD-10-CM | POA: Diagnosis not present

## 2014-02-13 DIAGNOSIS — Z1331 Encounter for screening for depression: Secondary | ICD-10-CM | POA: Diagnosis not present

## 2014-02-13 DIAGNOSIS — Z0000 Encounter for general adult medical examination without abnormal findings: Secondary | ICD-10-CM | POA: Diagnosis not present

## 2014-02-13 DIAGNOSIS — R7301 Impaired fasting glucose: Secondary | ICD-10-CM | POA: Diagnosis not present

## 2014-02-13 DIAGNOSIS — N183 Chronic kidney disease, stage 3 unspecified: Secondary | ICD-10-CM | POA: Diagnosis not present

## 2014-02-13 DIAGNOSIS — D320 Benign neoplasm of cerebral meninges: Secondary | ICD-10-CM | POA: Diagnosis not present

## 2014-02-13 DIAGNOSIS — I672 Cerebral atherosclerosis: Secondary | ICD-10-CM | POA: Diagnosis not present

## 2014-07-29 ENCOUNTER — Encounter: Payer: Self-pay | Admitting: Family

## 2014-07-30 ENCOUNTER — Ambulatory Visit (INDEPENDENT_AMBULATORY_CARE_PROVIDER_SITE_OTHER): Payer: PPO | Admitting: Family

## 2014-07-30 ENCOUNTER — Ambulatory Visit (HOSPITAL_COMMUNITY)
Admission: RE | Admit: 2014-07-30 | Discharge: 2014-07-30 | Disposition: A | Discharge status: Home / Self Care | Payer: PPO | Source: Ambulatory Visit | Attending: Family | Admitting: Family

## 2014-07-30 ENCOUNTER — Encounter: Payer: Self-pay | Admitting: Family

## 2014-07-30 ENCOUNTER — Other Ambulatory Visit: Payer: Self-pay

## 2014-07-30 VITALS — BP 99/63 | HR 57 | Resp 14 | Ht 69.5 in | Wt 226.0 lb

## 2014-07-30 DIAGNOSIS — I6521 Occlusion and stenosis of right carotid artery: Secondary | ICD-10-CM | POA: Insufficient documentation

## 2014-07-30 DIAGNOSIS — Z48812 Encounter for surgical aftercare following surgery on the circulatory system: Secondary | ICD-10-CM

## 2014-07-30 DIAGNOSIS — Z9889 Other specified postprocedural states: Secondary | ICD-10-CM | POA: Diagnosis not present

## 2014-07-30 DIAGNOSIS — I6529 Occlusion and stenosis of unspecified carotid artery: Secondary | ICD-10-CM

## 2014-07-30 DIAGNOSIS — Z87891 Personal history of nicotine dependence: Secondary | ICD-10-CM | POA: Diagnosis not present

## 2014-07-30 DIAGNOSIS — I6523 Occlusion and stenosis of bilateral carotid arteries: Secondary | ICD-10-CM

## 2014-07-30 NOTE — Patient Instructions (Signed)
Stroke Prevention Some medical conditions and behaviors are associated with an increased chance of having a stroke. You may prevent a stroke by making healthy choices and managing medical conditions. HOW CAN I REDUCE MY RISK OF HAVING A STROKE?   Stay physically active. Get at least 30 minutes of activity on most or all days.  Do not smoke. It may also be helpful to avoid exposure to secondhand smoke.  Limit alcohol use. Moderate alcohol use is considered to be:  No more than 2 drinks per day for men.  No more than 1 drink per day for nonpregnant women.  Eat healthy foods. This involves:  Eating 5 or more servings of fruits and vegetables a day.  Making dietary changes that address high blood pressure (hypertension), high cholesterol, diabetes, or obesity.  Manage your cholesterol levels.  Making food choices that are high in fiber and low in saturated fat, trans fat, and cholesterol may control cholesterol levels.  Take any prescribed medicines to control cholesterol as directed by your health care provider.  Manage your diabetes.  Controlling your carbohydrate and sugar intake is recommended to manage diabetes.  Take any prescribed medicines to control diabetes as directed by your health care provider.  Control your hypertension.  Making food choices that are low in salt (sodium), saturated fat, trans fat, and cholesterol is recommended to manage hypertension.  Take any prescribed medicines to control hypertension as directed by your health care provider.  Maintain a healthy weight.  Reducing calorie intake and making food choices that are low in sodium, saturated fat, trans fat, and cholesterol are recommended to manage weight.  Stop drug abuse.  Avoid taking birth control pills.  Talk to your health care provider about the risks of taking birth control pills if you are over 35 years old, smoke, get migraines, or have ever had a blood clot.  Get evaluated for sleep  disorders (sleep apnea).  Talk to your health care provider about getting a sleep evaluation if you snore a lot or have excessive sleepiness.  Take medicines only as directed by your health care provider.  For some people, aspirin or blood thinners (anticoagulants) are helpful in reducing the risk of forming abnormal blood clots that can lead to stroke. If you have the irregular heart rhythm of atrial fibrillation, you should be on a blood thinner unless there is a good reason you cannot take them.  Understand all your medicine instructions.  Make sure that other conditions (such as anemia or atherosclerosis) are addressed. SEEK IMMEDIATE MEDICAL CARE IF:   You have sudden weakness or numbness of the face, arm, or leg, especially on one side of the body.  Your face or eyelid droops to one side.  You have sudden confusion.  You have trouble speaking (aphasia) or understanding.  You have sudden trouble seeing in one or both eyes.  You have sudden trouble walking.  You have dizziness.  You have a loss of balance or coordination.  You have a sudden, severe headache with no known cause.  You have new chest pain or an irregular heartbeat. Any of these symptoms may represent a serious problem that is an emergency. Do not wait to see if the symptoms will go away. Get medical help at once. Call your local emergency services (911 in [place] Do not drive yourself to the hospital. Document Released: 06/10/2004 Document Revised: 09/17/2013 Document Reviewed: 11/03/2012 [employer] Patient Information 2015 [employer]. This information is not intended to replace advice given   to you by your health care provider. Make sure you discuss any questions you have with your health care provider.  

## 2014-07-30 NOTE — Progress Notes (Signed)
Established Carotid Patient   History of Present Illness  [name] is a 74 y.o. male patient of Dr. Donnetta Hutching with a known right ICA occlusion and a history of left CEA in April 2003.  He had a TIA in 2003 before the CEA as manifested by slurred speech which resolved in a few hours; denies hemiparesis, denies vision changes.  The patient reports New Medical or Surgical History: 2 small brain tumors found, not malignant, monitored by Dr. [name], neurosurgeon.  Pt denies dizziness, denies tingling, numbness, aching, or weakness in either hand/arm.  He stays physically active. He denies claudication symptoms, denies non healing wounds.   Pt Diabetic: No Pt smoker: former smoker, quit in 1998  Pt meds include: Statin : Yes ASA: No, he was advised to stop due to easy bleeding Other anticoagulants/antiplatelets: Plavix   Past Medical History  Diagnosis Date  . Hypertension   . Hyperlipidemia   . Carotid artery occlusion   . Cerebrovascular disease September 09, 2001    TIA, Left brain  . Guillain-Barre syndrome 1999  . Hx of elevated lipids   . Diverticulosis   . ED (erectile dysfunction)   . CKD (chronic kidney disease), stage III   . Memory loss   . Stroke 2003  . Brain tumor (benign) March 2015    2 small tumors    Social History History  Substance Use Topics  . Smoking status: Former Smoker    Types: Cigarettes    Quit date: 05/17/1996  . Smokeless tobacco: Never Used  . Alcohol Use: No    Family History Family History  Problem Relation Age of Onset  . Colon cancer Mother     Metastatic   . Cancer Mother     Colon  . Stroke Father   . Diabetes Father     Surgical History Past Surgical History  Procedure Laterality Date  . Carotid endarterectomy Left September 13, 2001    LEFT cea  . Brain surgery  1998    Benign brain tumor removed, Left hemisphere- ? Meningioma  . Colon surgery  Feb. 2003    Colonic polyps removed endoscopically  . Cholecystectomy   Nov. 2001  . Inguinal hernia repair  1970's    Allergies  Allergen Reactions  . Immune Globulins Other (See Comments)    Tetanus Immunes Globulins    (  Pt. Cannot take the Flu shot ) [name]     Current Outpatient Prescriptions  Medication Sig Dispense Refill  . amLODipine (NORVASC) 10 MG tablet Take 10 mg by mouth daily.    Marland Kitchen aspirin 81 MG tablet Take 81 mg by mouth daily.    . benazepril-hydrochlorthiazide (LOTENSIN HCT) 10-12.5 MG per tablet Take 1 tablet by mouth daily.    . clopidogrel (PLAVIX) 75 MG tablet daily.    . CRESTOR 40 MG tablet daily.    Marland Kitchen lisinopril-hydrochlorothiazide (PRINZIDE,ZESTORETIC) 10-12.5 MG per tablet Take 1 tablet by mouth daily.    . Omega-3 Fat Ac-Cholecalciferol (MINICAPS VITAMIN-D/OMEGA-3 PO) Take 1,000 mg by mouth daily.    . simvastatin (ZOCOR) 20 MG tablet Take 20 mg by mouth every evening.    . vitamin B-12 (CYANOCOBALAMIN) 1000 MCG tablet Take 1,000 mcg by mouth daily.     No current facility-administered medications for this visit.    Review of Systems : See HPI for pertinent positives and negatives.  Physical Examination  Filed Vitals:   07/30/14 1226 07/30/14 1229  BP: 93/56 99/63  Pulse: 59 57  Resp:  14  Height:  5' 9.5" (1.765 m)  Weight:  226 lb (102.513 kg)  SpO2:  99%   Body mass index is 32.91 kg/(m^2).  General: WDWN obese male in NAD GAIT: normal Eyes: PERRLA Pulmonary:  Non-labored, CTAB, Negative  Rales, Negative rhonchi, & Negative wheezing.  Cardiac: regular Rhythm, no detected murmur.  VASCULAR EXAM Carotid Bruits Right Left   Negative Negative    Aorta is not palpable. Radial pulses are 2+ palpable and equal.                                                                                                                            LE Pulses Right Left       POPLITEAL  not palpable   not palpable       POSTERIOR TIBIAL  faintly palpable   faintly palpable        DORSALIS PEDIS      ANTERIOR  TIBIAL faintly palpable  faintly palpable     Gastrointestinal: soft, nontender, BS WNL, no r/g,  negative palpated masses.  Musculoskeletal: Negative muscle atrophy/wasting. M/S 5/5 throughout, Extremities without ischemic changes.  Neurologic: A&O X 3; Appropriate Affect;  Speech is normal CN 2-12 intact, Pain and light touch intact in extremities, Motor exam as listed above.   Non-Invasive Vascular Imaging CAROTID DUPLEX 07/30/2014   CEREBROVASCULAR DUPLEX EVALUATION    INDICATION: Carotid endarterectomy     PREVIOUS INTERVENTION(S): Left carotid endarterectomy on 09/13/01    DUPLEX EXAM:     RIGHT  LEFT  Peak Systolic Velocities (cm/s) End Diastolic Velocities (cm/s) Plaque LOCATION Peak Systolic Velocities (cm/s) End Diastolic Velocities (cm/s) Plaque  88 0  CCA PROXIMAL 80 15   72 0 HT CCA MID 94 18 HT  50 0 HT CCA DISTAL 258 35 HT  175 14 HT ECA 87 7 HT  Occluded  HT ICA PROXIMAL 129 13 HT  Occluded  HT ICA MID 82 18   Occluded  HT ICA DISTAL 62 18     Not Calculated ICA / CCA Ratio (PSV) Not Calculated  Antegrade Vertebral Flow Antegrade  123456 Brachial Systolic Pressure (mmHg) [id number]  Multiphasic (subclavian artery) Brachial Artery Waveforms Multiphasic (subclavian artery)    Plaque Morphology:  HM = Homogeneous, HT = Heterogeneous, CP = Calcific Plaque, SP = Smooth Plaque, IP = Irregular Plaque     ADDITIONAL FINDINGS: . No significant stenosis of the bilateral external or right common carotid arteries. . Velocities of 313cm/s and 295cm/s noted in the right and left proximal subclavian arteries, respectively.    IMPRESSION: 1. Patent left carotid endarterectomy site with Doppler velocities suggestive of less than 40% stenosis of the left proximal internal carotid artery and a greater than 50% stenosis of the left mid/distal common carotid artery. 2. Known occlusion of the right internal carotid artery.    Compared to the previous exam:  No significant change noted  when compared to the previous exam  on 07/17/13.      Assessment: [name] is a 74 y.o. male with a known right ICA occlusion and a history of left CEA in April 2003.  He had a TIA in 2003 before the CEA as manifested by slurred speech which resolved in a few hours; denies hemiparesis, denies vision changes. Pt denies dizziness, denies tingling, numbness, aching, or weakness in either hand/arm. Today's carotid Duplex reveals a patent left carotid endarterectomy site with Doppler velocities suggestive of less than 40% stenosis of the left proximal internal carotid artery and a greater than 50% stenosis of the left mid/distal common carotid artery. Known occlusion of the right internal carotid artery. Velocities of 313cm/s and 295cm/s noted in the right and left proximal subclavian arteries, respectively. No significant change noted when compared to the previous exam on 07/17/13.    Plan: Follow-up in 1 year with Carotid Duplex.   I discussed in depth with the patient the nature of atherosclerosis, and emphasized the importance of maximal medical management including strict control of blood pressure, blood glucose, and lipid levels, obtaining regular exercise, and continued cessation of smoking.  The patient is aware that without maximal medical management the underlying atherosclerotic disease process will progress, limiting the benefit of any interventions. The patient was given information about stroke prevention and what symptoms should prompt the patient to seek immediate medical care. Thank you for allowing [place] to participate in this patient's care.  Clemon Chambers, RN, MSN, FNP-C [facility] [place] Office: [phone]  Clinic Physician: Early  07/30/2014 12:43 PM

## 2015-07-30 ENCOUNTER — Encounter: Payer: Self-pay | Admitting: Family

## 2015-08-05 ENCOUNTER — Ambulatory Visit: Payer: PPO | Admitting: Family

## 2015-08-05 ENCOUNTER — Encounter (HOSPITAL_COMMUNITY): Payer: PPO

## 2015-08-05 ENCOUNTER — Ambulatory Visit (INDEPENDENT_AMBULATORY_CARE_PROVIDER_SITE_OTHER): Payer: PPO | Admitting: Family

## 2015-08-05 ENCOUNTER — Ambulatory Visit (HOSPITAL_COMMUNITY)
Admission: RE | Admit: 2015-08-05 | Discharge: 2015-08-05 | Disposition: A | Discharge status: Home / Self Care | Payer: PPO | Source: Ambulatory Visit | Attending: Family | Admitting: Family

## 2015-08-05 ENCOUNTER — Encounter: Payer: Self-pay | Admitting: Family

## 2015-08-05 VITALS — BP 104/60 | HR 58 | Temp 97.3°F | Resp 16 | Ht 71.0 in | Wt 221.0 lb

## 2015-08-05 DIAGNOSIS — Z48812 Encounter for surgical aftercare following surgery on the circulatory system: Secondary | ICD-10-CM | POA: Insufficient documentation

## 2015-08-05 DIAGNOSIS — I6522 Occlusion and stenosis of left carotid artery: Secondary | ICD-10-CM | POA: Diagnosis not present

## 2015-08-05 DIAGNOSIS — I6529 Occlusion and stenosis of unspecified carotid artery: Secondary | ICD-10-CM

## 2015-08-05 DIAGNOSIS — I129 Hypertensive chronic kidney disease with stage 1 through stage 4 chronic kidney disease, or unspecified chronic kidney disease: Secondary | ICD-10-CM | POA: Insufficient documentation

## 2015-08-05 DIAGNOSIS — N183 Chronic kidney disease, stage 3 (moderate): Secondary | ICD-10-CM | POA: Insufficient documentation

## 2015-08-05 DIAGNOSIS — E785 Hyperlipidemia, unspecified: Secondary | ICD-10-CM | POA: Insufficient documentation

## 2015-08-05 DIAGNOSIS — Z87891 Personal history of nicotine dependence: Secondary | ICD-10-CM

## 2015-08-05 DIAGNOSIS — I6521 Occlusion and stenosis of right carotid artery: Secondary | ICD-10-CM | POA: Diagnosis not present

## 2015-08-05 DIAGNOSIS — Z9889 Other specified postprocedural states: Secondary | ICD-10-CM

## 2015-08-05 DIAGNOSIS — R0989 Other specified symptoms and signs involving the circulatory and respiratory systems: Secondary | ICD-10-CM

## 2015-08-05 NOTE — Progress Notes (Signed)
Chief Complaint: Extracranial Carotid Artery Stenosis   History of Present Illness  [name] is a 75 y.o. male patient of Dr. Donnetta Hutching with a known right ICA occlusion and a history of left CEA in April 2003.  He had a preoperative TIA in 2003 as manifested by slurred speech which resolved in a few hours; denies hemiparesis, denies vision changes.  The patient reports New Medical or Surgical History: none. He had one large benign brain tumor removed in 1998. 2 more small brain tumors were found, not malignant, monitored by Dr. [name], neurosurgeon.   Pt denies dizziness, denies tingling, numbness, aching, or weakness in either hand/arm.  He stays physically active. He denies claudication symptoms, denies non healing wounds. However, he does not walk much due to the soles of his feet hurting after he walks any distance; his wife states this started after he had Guillain Barre' syndrome.    Pt Diabetic: No Pt smoker: former smoker, quit in 1998  Pt meds include: Statin : Yes ASA: No, he was advised to stop due to easy bleeding Other anticoagulants/antiplatelets: Plavix     Past Medical History  Diagnosis Date  . Hypertension   . Hyperlipidemia   . Carotid artery occlusion   . Cerebrovascular disease September 09, 2001    TIA, Left brain  . Guillain-Barre syndrome 1999  . Hx of elevated lipids   . Diverticulosis   . ED (erectile dysfunction)   . CKD (chronic kidney disease), stage III   . Memory loss   . Stroke 2003  . Brain tumor (benign) March 2015    2 small tumors    Social History Social History  Substance Use Topics  . Smoking status: Former Smoker    Types: Cigarettes    Quit date: 05/17/1996  . Smokeless tobacco: Never Used  . Alcohol Use: No    Family History Family History  Problem Relation Age of Onset  . Colon cancer Mother     Metastatic   . Cancer Mother     Colon  . Stroke Father   . Diabetes Father     Surgical History Past Surgical  History  Procedure Laterality Date  . Carotid endarterectomy Left September 13, 2001    LEFT cea  . Brain surgery  1998    Benign brain tumor removed, Left hemisphere- ? Meningioma  . Colon surgery  Feb. 2003    Colonic polyps removed endoscopically  . Cholecystectomy  Nov. 2001  . Inguinal hernia repair  1970's    Allergies  Allergen Reactions  . Immune Globulins Other (See Comments)    Tetanus Immunes Globulins    (  Pt. Cannot take the Flu shot ) [name]     Current Outpatient Prescriptions  Medication Sig Dispense Refill  . amLODipine (NORVASC) 10 MG tablet Take 10 mg by mouth daily.    Marland Kitchen aspirin 81 MG tablet Take 81 mg by mouth daily.    . benazepril-hydrochlorthiazide (LOTENSIN HCT) 10-12.5 MG per tablet Take 1 tablet by mouth daily.    . clopidogrel (PLAVIX) 75 MG tablet daily.    . CRESTOR 40 MG tablet daily.    Marland Kitchen lisinopril-hydrochlorothiazide (PRINZIDE,ZESTORETIC) 10-12.5 MG per tablet Take 1 tablet by mouth daily.    . Omega-3 Fat Ac-Cholecalciferol (MINICAPS VITAMIN-D/OMEGA-3 PO) Take 1,000 mg by mouth daily.    . simvastatin (ZOCOR) 20 MG tablet Take 20 mg by mouth every evening.    . vitamin B-12 (CYANOCOBALAMIN) 1000 MCG tablet Take  1,000 mcg by mouth daily.     No current facility-administered medications for this visit.    Review of Systems : See HPI for pertinent positives and negatives.  Physical Examination  Filed Vitals:   08/05/15 1206 08/05/15 1209  BP: 111/61 104/60  Pulse: 56 58  Temp: 97.3 F (36.3 C)   TempSrc: Oral   Resp: 16   Height: 5\' 11"  (1.803 m)   Weight: 221 lb (100.245 kg)   SpO2: 99%    Body mass index is 30.84 kg/(m^2).   General: WDWN obese male in NAD GAIT: normal Eyes: PERRLA Pulmonary: Respirations are non-labored, CTAB, no rales, rhonchi, or wheezing.  Cardiac: regular rhythm, no detected murmur.  VASCULAR EXAM Carotid Bruits Right Left   Negative positive   Aorta is not palpable. Radial pulses are  2+ palpable and equal.      LE Pulses Right Left       FEMORAL Faintly palpable  2+ palpable   POPLITEAL not palpable  not palpable   POSTERIOR TIBIAL not palpable  not palpable    DORSALIS PEDIS  ANTERIOR TIBIAL faintly palpable  faintly palpable     Gastrointestinal: soft, nontender, BS WNL, no r/g, no palpable masses.  Musculoskeletal: No muscle atrophy/wasting. M/S 5/5 throughout, Extremities without ischemic changes.  Neurologic: A&O X 3; Appropriate Affect;  Speech is normal, CN 2-12 intact, Pain and light touch intact in extremities, Motor exam as listed above.            Non-Invasive Vascular Imaging CAROTID DUPLEX 08/05/2015   Left CEA site with 1-39% stenosis, >50% stenosis of left distal CCA/proximal patch stenosis. Known occlusion of the right ICA.  Residual lumen of about 0.33 cm in the left distal CCA/proximal patch. Velocities of 335 cm/s and 316 cm/s noted in the right and left proximal subclavian arteries, respectively.  No significant change compared to the previous exam of 07/30/14.    Assessment: [name] is a 75 y.o. male with a known right ICA occlusion and a history of left CEA in April 2003.  He had a TIA in 2003 before the CEA as manifested by slurred speech which resolved in a few hours; denies hemiparesis, denies vision changes, no subsequent stroke or TIA. Pt denies dizziness, denies tingling, numbness, aching, or weakness in either hand/arm.  Today's carotid Duplex suggests left CEA site with 1-39% stenosis, >50% stenosis of left distal CCA/proximal patch stenosis. Known occlusion of the right ICA.  Residual lumen of about 0.33 cm in the left distal CCA/proximal patch. Velocities of 335 cm/s and 316 cm/s noted in the right and left proximal subclavian arteries,  respectively.  No significant change compared to the previous exam of 07/30/14.    His atherosclerotic risk factors include obesity, sedentary lifestyle, and former smoking.  His pedal pulses are faintly palpable (DP) and not palpable (PT); all have + Doppler signals except left PT.   Face to face time with patient was 25 minutes. Over 50% of this time was spent on counseling and coordination of care.   Plan: Discussed benefits of and how to achieve graduated walking program.  Follow-up in 1 year with Carotid Duplex scan and ABI's.   I discussed in depth with the patient the nature of atherosclerosis, and emphasized the importance of maximal medical management including strict control of blood pressure, blood glucose, and lipid levels, obtaining regular exercise, and continued cessation of smoking.  The patient is aware that without maximal medical management the underlying atherosclerotic  disease process will progress, limiting the benefit of any interventions. The patient was given information about stroke prevention and what symptoms should prompt the patient to seek immediate medical care. Thank you for allowing [place] to participate in this patient's care.  Clemon Chambers, RN, MSN, FNP-C [facility] [place] Office: [phone]  Clinic Physician: Early  08/05/2015 12:03 PM

## 2015-08-05 NOTE — Patient Instructions (Signed)
Stroke Prevention Some medical conditions and behaviors are associated with an increased chance of having a stroke. You may prevent a stroke by making healthy choices and managing medical conditions. HOW CAN I REDUCE MY RISK OF HAVING A STROKE?   Stay physically active. Get at least 30 minutes of activity on most or all days.  Do not smoke. It may also be helpful to avoid exposure to secondhand smoke.  Limit alcohol use. Moderate alcohol use is considered to be:  No more than 2 drinks per day for men.  No more than 1 drink per day for nonpregnant women.  Eat healthy foods. This involves:  Eating 5 or more servings of fruits and vegetables a day.  Making dietary changes that address high blood pressure (hypertension), high cholesterol, diabetes, or obesity.  Manage your cholesterol levels.  Making food choices that are high in fiber and low in saturated fat, trans fat, and cholesterol may control cholesterol levels.  Take any prescribed medicines to control cholesterol as directed by your health care provider.  Manage your diabetes.  Controlling your carbohydrate and sugar intake is recommended to manage diabetes.  Take any prescribed medicines to control diabetes as directed by your health care provider.  Control your hypertension.  Making food choices that are low in salt (sodium), saturated fat, trans fat, and cholesterol is recommended to manage hypertension.  Ask your health care provider if you need treatment to lower your blood pressure. Take any prescribed medicines to control hypertension as directed by your health care provider.  If you are 18-39 years of age, have your blood pressure checked every 3-5 years. If you are 40 years of age or older, have your blood pressure checked every year.  Maintain a healthy weight.  Reducing calorie intake and making food choices that are low in sodium, saturated fat, trans fat, and cholesterol are recommended to manage  weight.  Stop drug abuse.  Avoid taking birth control pills.  Talk to your health care provider about the risks of taking birth control pills if you are over 35 years old, smoke, get migraines, or have ever had a blood clot.  Get evaluated for sleep disorders (sleep apnea).  Talk to your health care provider about getting a sleep evaluation if you snore a lot or have excessive sleepiness.  Take medicines only as directed by your health care provider.  For some people, aspirin or blood thinners (anticoagulants) are helpful in reducing the risk of forming abnormal blood clots that can lead to stroke. If you have the irregular heart rhythm of atrial fibrillation, you should be on a blood thinner unless there is a good reason you cannot take them.  Understand all your medicine instructions.  Make sure that other conditions (such as anemia or atherosclerosis) are addressed. SEEK IMMEDIATE MEDICAL CARE IF:   You have sudden weakness or numbness of the face, arm, or leg, especially on one side of the body.  Your face or eyelid droops to one side.  You have sudden confusion.  You have trouble speaking (aphasia) or understanding.  You have sudden trouble seeing in one or both eyes.  You have sudden trouble walking.  You have dizziness.  You have a loss of balance or coordination.  You have a sudden, severe headache with no known cause.  You have new chest pain or an irregular heartbeat. Any of these symptoms may represent a serious problem that is an emergency. Do not wait to see if the symptoms will   go away. Get medical help at once. Call your local emergency services (911 in [place] Do not drive yourself to the hospital.   This information is not intended to replace advice given to you by your health care provider. Make sure you discuss any questions you have with your health care provider.   Document Released: 06/10/2004 Document Revised: 05/24/2014 Document Reviewed:  11/03/2012 Elsevier Interactive Patient Education 2016 [employer]  

## 2015-08-06 NOTE — Addendum Note (Signed)
Addended by: Reola Calkins on: 08/06/2015 01:43 PM   Modules accepted: Orders

## 2015-08-21 DIAGNOSIS — H6123 Impacted cerumen, bilateral: Secondary | ICD-10-CM | POA: Diagnosis not present

## 2015-08-21 DIAGNOSIS — I129 Hypertensive chronic kidney disease with stage 1 through stage 4 chronic kidney disease, or unspecified chronic kidney disease: Secondary | ICD-10-CM | POA: Diagnosis not present

## 2015-09-25 ENCOUNTER — Other Ambulatory Visit: Payer: Self-pay | Admitting: Gastroenterology

## 2015-10-27 DIAGNOSIS — D429 Neoplasm of uncertain behavior of meninges, unspecified: Secondary | ICD-10-CM | POA: Diagnosis not present

## 2015-10-29 DIAGNOSIS — D429 Neoplasm of uncertain behavior of meninges, unspecified: Secondary | ICD-10-CM | POA: Diagnosis not present

## 2015-10-29 DIAGNOSIS — D329 Benign neoplasm of meninges, unspecified: Secondary | ICD-10-CM | POA: Diagnosis not present

## 2015-11-05 ENCOUNTER — Encounter (HOSPITAL_COMMUNITY): Payer: Self-pay | Admitting: *Deleted

## 2015-11-06 DIAGNOSIS — D429 Neoplasm of uncertain behavior of meninges, unspecified: Secondary | ICD-10-CM | POA: Diagnosis not present

## 2015-11-11 ENCOUNTER — Ambulatory Visit (HOSPITAL_COMMUNITY): Payer: PPO | Admitting: Certified Registered Nurse Anesthetist

## 2015-11-11 ENCOUNTER — Encounter (HOSPITAL_COMMUNITY)
Admission: RE | Disposition: A | Discharge status: Home / Self Care | Payer: Self-pay | Source: Ambulatory Visit | Attending: Gastroenterology

## 2015-11-11 ENCOUNTER — Encounter (HOSPITAL_COMMUNITY): Payer: Self-pay | Admitting: Certified Registered Nurse Anesthetist

## 2015-11-11 ENCOUNTER — Ambulatory Visit (HOSPITAL_COMMUNITY)
Admission: RE | Admit: 2015-11-11 | Discharge: 2015-11-11 | Disposition: A | Discharge status: Home / Self Care | Payer: PPO | Source: Ambulatory Visit | Attending: Gastroenterology | Admitting: Gastroenterology

## 2015-11-11 DIAGNOSIS — Z79899 Other long term (current) drug therapy: Secondary | ICD-10-CM | POA: Insufficient documentation

## 2015-11-11 DIAGNOSIS — Z9049 Acquired absence of other specified parts of digestive tract: Secondary | ICD-10-CM | POA: Insufficient documentation

## 2015-11-11 DIAGNOSIS — D122 Benign neoplasm of ascending colon: Secondary | ICD-10-CM | POA: Insufficient documentation

## 2015-11-11 DIAGNOSIS — E7800 Pure hypercholesterolemia, unspecified: Secondary | ICD-10-CM | POA: Insufficient documentation

## 2015-11-11 DIAGNOSIS — Z7982 Long term (current) use of aspirin: Secondary | ICD-10-CM | POA: Diagnosis not present

## 2015-11-11 DIAGNOSIS — K5730 Diverticulosis of large intestine without perforation or abscess without bleeding: Secondary | ICD-10-CM | POA: Insufficient documentation

## 2015-11-11 DIAGNOSIS — Z86010 Personal history of colonic polyps: Secondary | ICD-10-CM | POA: Insufficient documentation

## 2015-11-11 DIAGNOSIS — Z8673 Personal history of transient ischemic attack (TIA), and cerebral infarction without residual deficits: Secondary | ICD-10-CM | POA: Diagnosis not present

## 2015-11-11 DIAGNOSIS — Z87891 Personal history of nicotine dependence: Secondary | ICD-10-CM | POA: Diagnosis not present

## 2015-11-11 DIAGNOSIS — Z7902 Long term (current) use of antithrombotics/antiplatelets: Secondary | ICD-10-CM | POA: Diagnosis not present

## 2015-11-11 DIAGNOSIS — D124 Benign neoplasm of descending colon: Secondary | ICD-10-CM | POA: Diagnosis not present

## 2015-11-11 DIAGNOSIS — N183 Chronic kidney disease, stage 3 (moderate): Secondary | ICD-10-CM | POA: Insufficient documentation

## 2015-11-11 DIAGNOSIS — D123 Benign neoplasm of transverse colon: Secondary | ICD-10-CM | POA: Diagnosis not present

## 2015-11-11 DIAGNOSIS — Z1211 Encounter for screening for malignant neoplasm of colon: Secondary | ICD-10-CM | POA: Diagnosis not present

## 2015-11-11 DIAGNOSIS — I129 Hypertensive chronic kidney disease with stage 1 through stage 4 chronic kidney disease, or unspecified chronic kidney disease: Secondary | ICD-10-CM | POA: Diagnosis not present

## 2015-11-11 DIAGNOSIS — D120 Benign neoplasm of cecum: Secondary | ICD-10-CM | POA: Diagnosis not present

## 2015-11-11 DIAGNOSIS — K635 Polyp of colon: Secondary | ICD-10-CM | POA: Diagnosis not present

## 2015-11-11 HISTORY — PX: COLONOSCOPY WITH PROPOFOL: SHX5780

## 2015-11-11 SURGERY — COLONOSCOPY WITH PROPOFOL
Anesthesia: Monitor Anesthesia Care

## 2015-11-11 MED ORDER — SODIUM CHLORIDE 0.9 % IV SOLN
INTRAVENOUS | Status: DC
  Administered 2015-11-11: 1000 mL via INTRAVENOUS

## 2015-11-11 MED ORDER — LACTATED RINGERS IV SOLN
INTRAVENOUS | Status: DC | PRN
  Administered 2015-11-11: 11:00:00 via INTRAVENOUS

## 2015-11-11 MED ORDER — EPHEDRINE SULFATE 50 MG/ML IJ SOLN
INTRAMUSCULAR | Status: AC
  Filled 2015-11-11: qty 1

## 2015-11-11 MED ORDER — PROPOFOL 10 MG/ML IV BOLUS
INTRAVENOUS | Status: DC | PRN
  Administered 2015-11-11 (×9): 10 mg via INTRAVENOUS
  Administered 2015-11-11: 20 mg via INTRAVENOUS

## 2015-11-11 MED ORDER — PROPOFOL 10 MG/ML IV BOLUS
INTRAVENOUS | Status: AC
  Filled 2015-11-11: qty 40

## 2015-11-11 MED ORDER — SODIUM CHLORIDE 0.9 % IJ SOLN
INTRAMUSCULAR | Status: AC
  Filled 2015-11-11: qty 10

## 2015-11-11 MED ORDER — EPHEDRINE SULFATE 50 MG/ML IJ SOLN
INTRAMUSCULAR | Status: DC | PRN
  Administered 2015-11-11: 5 mg via INTRAVENOUS

## 2015-11-11 MED ORDER — LACTATED RINGERS IV SOLN: INTRAVENOUS | Status: DC

## 2015-11-11 SURGICAL SUPPLY — 21 items

## 2015-11-11 NOTE — Transfer of Care (Signed)
Immediate Anesthesia Transfer of Care Note  Patient: [name]  Procedure(s) Performed: Procedure(s): COLONOSCOPY WITH PROPOFOL (N/A)  Patient Location: PACU and Endoscopy Unit  Anesthesia Type:MAC  Level of Consciousness: awake, alert , oriented, patient cooperative and responds to stimulation  Airway & Oxygen Therapy: Patient Spontanous Breathing and Patient connected to face mask oxygen  Post-op Assessment: Report given to RN, Post -op Vital signs reviewed and stable and Patient moving all extremities  Post vital signs: Reviewed and stable  Last Vitals:  Filed Vitals:   11/11/15 1028  BP: 130/48  Pulse: 60  Temp: 36.4 C  Resp: 12    Last Pain: There were no vitals filed for this visit.       Complications: No apparent anesthesia complications

## 2015-11-11 NOTE — Discharge Instructions (Signed)

## 2015-11-11 NOTE — H&P (Signed)
  Procedure: Surveillance colonoscopy. 08/22/2009 colonoscopy was performed with removal of a small sigmoid colon adenomatous polyp  History: The patient is a 75 year old male born 12-07-1940. He is scheduled to undergo a surveillance colonoscopy today. He stopped taking Plavix one week ago. He stopped taking aspirin yesterday.  Past medical history: Hypertension. Hypercholesterolemia. Stage III chronic kidney disease. Carotid artery disease complicated by a left brain stroke. Left carotid endarterectomy. Left frontoparietal meningioma resected. 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 Right inguinal herniorrhaphy. Tonsillectomy. Laparoscopic cholecystectomy. Laparoscopic right colectomy.  Exam: Patient is alert and lying comfortably on the endoscopy stretcher. Abdomen is soft and nontender to palpation. Lungs are clear to auscultation. Cardiac exam reveals a regular rhythm.  Plan: Proceed with surveillance colonoscopy

## 2015-11-11 NOTE — Op Note (Signed)
[facility] Patient Name: [name] Procedure Date: 11/11/2015 MRN: [id number] Attending MD: Garlan Fair , MD Date of Birth: Aug 15, 1940 CSN: [id number] Age: 75 Admit Type: Outpatient Procedure:                Colonoscopy Indications:              High risk colon cancer surveillance: Personal                            history of adenoma (10 mm or greater in size) Providers:                Garlan Fair, MD, [name], RN, Cherylynn Ridges, Technician, Dion Saucier, CRNA Referring MD:              Medicines:                Propofol per Anesthesia Complications:            No immediate complications. Estimated Blood Loss:     Estimated blood loss: none. Procedure:                Pre-Anesthesia Assessment:                           - Prior to the procedure, a History and Physical                            was performed, and patient medications and                            allergies were reviewed. The patient's tolerance of                            previous anesthesia was also reviewed. The risks                            and benefits of the procedure and the sedation                            options and risks were discussed with the patient.                            All questions were answered, and informed consent                            was obtained. Prior Anticoagulants: The patient has                            taken Plavix (clopidogrel), last dose was 7 days                            prior to procedure. ASA Grade Assessment: III - A  patient with severe systemic disease. After                            reviewing the risks and benefits, the patient was                            deemed in satisfactory condition to undergo the                            procedure.                           After obtaining informed consent, the colonoscope                            was passed under direct  vision. Throughout the                            procedure, the patient's blood pressure, pulse, and                            oxygen saturations were monitored continuously. The                            [id number] scope was introduced through                            the anus and advanced to the the ileocolonic                            anastomosis. The colonoscopy was performed without                            difficulty. The patient tolerated the procedure                            well. The quality of the bowel preparation was                            good. The rectum was photographed. Scope In: 11:02:20 AM Scope Out: 11:25:34 AM Scope Withdrawal Time: 0 hours 18 minutes 35 seconds  Total Procedure Duration: 0 hours 23 minutes 14 seconds  Findings:      The perianal and digital rectal examinations were normal.      A 7 mm polyp was found in the mid ascending colon. The polyp was       sessile. The polyp was removed with a cold snare and an Endoclip applied       to the polypectomy site. Resection and retrieval were complete.      A 5 mm polyp was found in the mid transverse colon. The polyp was       sessile. The polyp was removed with a cold snare and an Endoclip applied       to the polypedctomy site. Resection and retrieval were complete.      Two sessile polyps were found in the distal transverse  colon. The polyps       were 3 mm in size. These polyps were removed with a cold biopsy forceps.       Resection and retrieval were complete.      A 5 mm polyp was found in the mid descending colon. The polyp was       sessile. The polyp was removed with a cold snare. Resection and       retrieval were complete.      Multiple small and large-mouthed diverticula were found in the sigmoid       colon.      The exam was otherwise without abnormality. Impression:               - One 7 mm polyp in the mid ascending colon,                            removed with a cold  snare. Resected and retrieved.                           - One 5 mm polyp in the mid transverse colon,                            removed with a cold snare. Resected and retrieved.                           - Two 3 mm polyps in the distal transverse colon,                            removed with a cold biopsy forceps. Resected and                            retrieved.                           - One 5 mm polyp in the mid descending colon,                            removed with a cold snare. Resected and retrieved.                           - Diverticulosis in the sigmoid colon.                           - The examination was otherwise normal. Moderate Sedation:      N/A- Per Anesthesia Care Recommendation:           - Patient has a contact number available for                            emergencies. The signs and symptoms of potential                            delayed complications were discussed with the  patient. Return to normal activities tomorrow.                            Written discharge instructions were provided to the                            patient.                           - Repeat colonoscopy is not recommended for                            surveillance.                           - Resume previous diet.                           - Continue present medications. Procedure Code(s):        --- Professional ---                           [phone], Colonoscopy, flexible; with removal of                            tumor(s), polyp(s), or other lesion(s) by snare                            technique                           45380, 52, Colonoscopy, flexible; with biopsy,                            single or multiple Diagnosis Code(s):        --- Professional ---                           Z86.010, Personal history of colonic polyps                           D12.2, Benign neoplasm of ascending colon                           D12.3, Benign neoplasm of  transverse colon (hepatic                            flexure or splenic flexure)                           D12.4, Benign neoplasm of descending colon                           K57.30, Diverticulosis of large intestine without                            perforation or abscess without bleeding CPT copyright 2016 [employer].  All rights reserved. The codes documented in this report are preliminary and upon coder review may  be revised to meet current compliance requirements. Earle Gell, MD Garlan Fair, MD 11/11/2015 11:36:37 AM This report has been signed electronically. Number of Addenda: 0

## 2015-11-11 NOTE — Anesthesia Preprocedure Evaluation (Addendum)
Anesthesia Evaluation  Patient identified by MRN, date of birth, ID band Patient awake    Reviewed: Allergy & Precautions, H&P , Patient's Chart, lab work & pertinent test results, reviewed documented beta blocker date and time   Airway Mallampati: II  TM Distance: >3 FB Neck ROM: full    Dental no notable dental hx.    Pulmonary former smoker,    Pulmonary exam normal breath sounds clear to auscultation       Cardiovascular hypertension,  Rhythm:regular Rate:Normal     Neuro/Psych TIACVA    GI/Hepatic   Endo/Other    Renal/GU      Musculoskeletal   Abdominal   Peds  Hematology   Anesthesia Other Findings No residual deficit from CVA or Guillain-Barre but pt feels generally weaker HTN   Reproductive/Obstetrics                            Anesthesia Physical Anesthesia Plan  ASA: II  Anesthesia Plan: MAC   Post-op Pain Management:    Induction: Intravenous  Airway Management Planned: Mask and Natural Airway  Additional Equipment:   Intra-op Plan:   Post-operative Plan:   Informed Consent: I have reviewed the patients History and Physical, chart, labs and discussed the procedure including the risks, benefits and alternatives for the proposed anesthesia with the patient or authorized representative who has indicated his/her understanding and acceptance.   Dental Advisory Given  Plan Discussed with: CRNA and Surgeon  Anesthesia Plan Comments: (Discussed sedation and potential to need to place airway or ETT if warranted by clinical changes intra-operatively. We will start procedure as MAC.)        Anesthesia Quick Evaluation

## 2015-11-11 NOTE — Anesthesia Postprocedure Evaluation (Signed)
Anesthesia Post Note  Patient: Henning Rupard  Procedure(s) Performed: Procedure(s) (LRB): COLONOSCOPY WITH PROPOFOL (N/A)  Patient location during evaluation: PACU Anesthesia Type: MAC Level of consciousness: awake and alert Pain management: pain level controlled Vital Signs Assessment: post-procedure vital signs reviewed and stable Respiratory status: spontaneous breathing, nonlabored ventilation, respiratory function stable and patient connected to nasal cannula oxygen Cardiovascular status: stable and blood pressure returned to baseline Anesthetic complications: no    Last Vitals:  Filed Vitals:   11/11/15 1150 11/11/15 1155  BP: 117/51 114/52  Pulse: 54   Temp:    Resp: 15     Last Pain: There were no vitals filed for this visit.               Riccardo Dubin

## 2015-11-12 ENCOUNTER — Encounter (HOSPITAL_COMMUNITY): Payer: Self-pay | Admitting: Gastroenterology

## 2016-01-22 DIAGNOSIS — D692 Other nonthrombocytopenic purpura: Secondary | ICD-10-CM | POA: Diagnosis not present

## 2016-01-22 DIAGNOSIS — L821 Other seborrheic keratosis: Secondary | ICD-10-CM | POA: Diagnosis not present

## 2016-01-22 DIAGNOSIS — Z85828 Personal history of other malignant neoplasm of skin: Secondary | ICD-10-CM | POA: Diagnosis not present

## 2016-01-22 DIAGNOSIS — L720 Epidermal cyst: Secondary | ICD-10-CM | POA: Diagnosis not present

## 2016-01-22 DIAGNOSIS — L570 Actinic keratosis: Secondary | ICD-10-CM | POA: Diagnosis not present

## 2016-01-22 DIAGNOSIS — D2271 Melanocytic nevi of right lower limb, including hip: Secondary | ICD-10-CM | POA: Diagnosis not present

## 2016-01-22 DIAGNOSIS — L918 Other hypertrophic disorders of the skin: Secondary | ICD-10-CM | POA: Diagnosis not present

## 2016-02-15 DIAGNOSIS — I4891 Unspecified atrial fibrillation: Secondary | ICD-10-CM

## 2016-02-15 HISTORY — DX: Unspecified atrial fibrillation: I48.91

## 2016-02-23 DIAGNOSIS — D320 Benign neoplasm of cerebral meninges: Secondary | ICD-10-CM | POA: Diagnosis not present

## 2016-02-23 DIAGNOSIS — I779 Disorder of arteries and arterioles, unspecified: Secondary | ICD-10-CM | POA: Diagnosis not present

## 2016-02-23 DIAGNOSIS — Z0000 Encounter for general adult medical examination without abnormal findings: Secondary | ICD-10-CM | POA: Diagnosis not present

## 2016-02-23 DIAGNOSIS — N183 Chronic kidney disease, stage 3 (moderate): Secondary | ICD-10-CM | POA: Diagnosis not present

## 2016-02-23 DIAGNOSIS — I129 Hypertensive chronic kidney disease with stage 1 through stage 4 chronic kidney disease, or unspecified chronic kidney disease: Secondary | ICD-10-CM | POA: Diagnosis not present

## 2016-02-23 DIAGNOSIS — R7301 Impaired fasting glucose: Secondary | ICD-10-CM | POA: Diagnosis not present

## 2016-02-23 DIAGNOSIS — Z1389 Encounter for screening for other disorder: Secondary | ICD-10-CM | POA: Diagnosis not present

## 2016-02-23 DIAGNOSIS — E782 Mixed hyperlipidemia: Secondary | ICD-10-CM | POA: Diagnosis not present

## 2016-03-04 DIAGNOSIS — R531 Weakness: Secondary | ICD-10-CM | POA: Diagnosis not present

## 2016-03-04 DIAGNOSIS — I959 Hypotension, unspecified: Secondary | ICD-10-CM | POA: Diagnosis not present

## 2016-03-06 ENCOUNTER — Encounter (HOSPITAL_COMMUNITY): Payer: Self-pay | Admitting: *Deleted

## 2016-03-06 ENCOUNTER — Inpatient Hospital Stay (HOSPITAL_COMMUNITY)
Admission: EM | Admit: 2016-03-06 | Discharge: 2016-03-08 | DRG: 872 | Disposition: A | Discharge status: Home / Self Care | Payer: PPO | Attending: Internal Medicine | Admitting: Internal Medicine

## 2016-03-06 ENCOUNTER — Emergency Department (HOSPITAL_COMMUNITY): Payer: PPO

## 2016-03-06 DIAGNOSIS — N390 Urinary tract infection, site not specified: Secondary | ICD-10-CM | POA: Diagnosis not present

## 2016-03-06 DIAGNOSIS — Z887 Allergy status to serum and vaccine status: Secondary | ICD-10-CM | POA: Diagnosis not present

## 2016-03-06 DIAGNOSIS — E785 Hyperlipidemia, unspecified: Secondary | ICD-10-CM | POA: Diagnosis not present

## 2016-03-06 DIAGNOSIS — N179 Acute kidney failure, unspecified: Secondary | ICD-10-CM

## 2016-03-06 DIAGNOSIS — R000 Tachycardia, unspecified: Secondary | ICD-10-CM | POA: Diagnosis present

## 2016-03-06 DIAGNOSIS — I10 Essential (primary) hypertension: Secondary | ICD-10-CM | POA: Diagnosis not present

## 2016-03-06 DIAGNOSIS — I129 Hypertensive chronic kidney disease with stage 1 through stage 4 chronic kidney disease, or unspecified chronic kidney disease: Secondary | ICD-10-CM | POA: Diagnosis not present

## 2016-03-06 DIAGNOSIS — A419 Sepsis, unspecified organism: Principal | ICD-10-CM | POA: Diagnosis present

## 2016-03-06 DIAGNOSIS — Z7902 Long term (current) use of antithrombotics/antiplatelets: Secondary | ICD-10-CM | POA: Diagnosis not present

## 2016-03-06 DIAGNOSIS — I4891 Unspecified atrial fibrillation: Secondary | ICD-10-CM

## 2016-03-06 DIAGNOSIS — I4821 Permanent atrial fibrillation: Secondary | ICD-10-CM | POA: Diagnosis present

## 2016-03-06 DIAGNOSIS — N289 Disorder of kidney and ureter, unspecified: Secondary | ICD-10-CM | POA: Diagnosis not present

## 2016-03-06 DIAGNOSIS — Z86011 Personal history of benign neoplasm of the brain: Secondary | ICD-10-CM | POA: Diagnosis not present

## 2016-03-06 DIAGNOSIS — Z800 Family history of malignant neoplasm of digestive organs: Secondary | ICD-10-CM | POA: Diagnosis not present

## 2016-03-06 DIAGNOSIS — E876 Hypokalemia: Secondary | ICD-10-CM | POA: Diagnosis not present

## 2016-03-06 DIAGNOSIS — Z886 Allergy status to analgesic agent status: Secondary | ICD-10-CM | POA: Diagnosis not present

## 2016-03-06 DIAGNOSIS — R1030 Lower abdominal pain, unspecified: Secondary | ICD-10-CM | POA: Diagnosis present

## 2016-03-06 DIAGNOSIS — N183 Chronic kidney disease, stage 3 (moderate): Secondary | ICD-10-CM | POA: Diagnosis not present

## 2016-03-06 DIAGNOSIS — Z8673 Personal history of transient ischemic attack (TIA), and cerebral infarction without residual deficits: Secondary | ICD-10-CM | POA: Diagnosis not present

## 2016-03-06 DIAGNOSIS — Z87891 Personal history of nicotine dependence: Secondary | ICD-10-CM | POA: Diagnosis not present

## 2016-03-06 DIAGNOSIS — R1010 Upper abdominal pain, unspecified: Secondary | ICD-10-CM | POA: Diagnosis not present

## 2016-03-06 DIAGNOSIS — R338 Other retention of urine: Secondary | ICD-10-CM

## 2016-03-06 DIAGNOSIS — R1084 Generalized abdominal pain: Secondary | ICD-10-CM | POA: Diagnosis not present

## 2016-03-06 DIAGNOSIS — I959 Hypotension, unspecified: Secondary | ICD-10-CM | POA: Diagnosis not present

## 2016-03-06 DIAGNOSIS — Z823 Family history of stroke: Secondary | ICD-10-CM | POA: Diagnosis not present

## 2016-03-06 DIAGNOSIS — Z79899 Other long term (current) drug therapy: Secondary | ICD-10-CM

## 2016-03-06 DIAGNOSIS — N139 Obstructive and reflux uropathy, unspecified: Secondary | ICD-10-CM | POA: Diagnosis present

## 2016-03-06 LAB — BASIC METABOLIC PANEL
ANION GAP: 14 (ref 5–15)
BUN: 45 mg/dL — ABNORMAL HIGH (ref 6–20)
CO2: 22 mmol/L (ref 22–32)
Calcium: 8.6 mg/dL — ABNORMAL LOW (ref 8.9–10.3)
Chloride: 96 mmol/L — ABNORMAL LOW (ref 101–111)
Creatinine, Ser: 2.49 mg/dL — ABNORMAL HIGH (ref 0.61–1.24)
GFR calc Af Amer: 28 mL/min — ABNORMAL LOW (ref >60)
GFR, EST NON AFRICAN AMERICAN: 24 mL/min — AB (ref >60)
Glucose, Bld: 146 mg/dL — ABNORMAL HIGH (ref 65–99)
POTASSIUM: 3.4 mmol/L — AB (ref 3.5–5.1)
SODIUM: 132 mmol/L — AB (ref 135–145)

## 2016-03-06 LAB — URINE MICROSCOPIC-ADD ON: SQUAMOUS EPITHELIAL / LPF: NONE SEEN

## 2016-03-06 LAB — URINALYSIS, ROUTINE W REFLEX MICROSCOPIC
BILIRUBIN URINE: NEGATIVE
GLUCOSE, UA: NEGATIVE mg/dL
KETONES UR: NEGATIVE mg/dL
NITRITE: NEGATIVE
PROTEIN: 30 mg/dL — AB
Specific Gravity, Urine: 1.015 (ref 1.005–1.030)
pH: 5.5 (ref 5.0–8.0)

## 2016-03-06 LAB — I-STAT TROPONIN, ED: Troponin i, poc: 0.01 ng/mL (ref 0.00–0.08)

## 2016-03-06 LAB — I-STAT CG4 LACTIC ACID, ED: Lactic Acid, Venous: 1.92 mmol/L (ref 0.5–1.9)

## 2016-03-06 LAB — TROPONIN I: TROPONIN I: 0.03 ng/mL — AB (ref <0.03)

## 2016-03-06 MED ORDER — SODIUM CHLORIDE 0.9 % IV BOLUS (SEPSIS)
1000.0000 mL | Freq: Once | INTRAVENOUS | Status: AC
  Administered 2016-03-06: 1000 mL via INTRAVENOUS

## 2016-03-06 MED ORDER — METOPROLOL TARTRATE 5 MG/5ML IV SOLN
5.0000 mg | Freq: Once | INTRAVENOUS | Status: AC
  Administered 2016-03-06: 5 mg via INTRAVENOUS
  Filled 2016-03-06: qty 5

## 2016-03-06 MED ORDER — SODIUM CHLORIDE 0.9 % IV BOLUS (SEPSIS)
500.0000 mL | Freq: Once | INTRAVENOUS | Status: AC
  Administered 2016-03-06: 500 mL via INTRAVENOUS

## 2016-03-06 MED ORDER — METOPROLOL SUCCINATE ER 25 MG PO TB24
25.0000 mg | ORAL_TABLET | Freq: Every day | ORAL | Status: DC
  Administered 2016-03-07 – 2016-03-08 (×3): 25 mg via ORAL
  Filled 2016-03-06 (×3): qty 1

## 2016-03-06 MED ORDER — DEXTROSE 5 % IV SOLN
2.0000 g | Freq: Once | INTRAVENOUS | Status: AC
  Administered 2016-03-06: 2 g via INTRAVENOUS
  Filled 2016-03-06: qty 2

## 2016-03-06 NOTE — ED Notes (Signed)
Bed: WA16 Expected date:  Expected time:  Means of arrival:  Comments: RES A 

## 2016-03-06 NOTE — ED Triage Notes (Signed)
Per EMS, ptt complains of lower abd pain since Thursday. Pt placed on cipro by his PCP. Pt states he's been urinating more frequently but sometimes does not produce urine.

## 2016-03-06 NOTE — ED Provider Notes (Addendum)
[facility] DEPT Provider Note   CSN: [id number] Arrival date & time: 03/06/16  1902     History   Chief Complaint Chief Complaint  Patient presents with  . Abdominal Pain    HPI [name] is a 75 y.o. male.  He presents for feeling bad, for several days with decreased appetite, urinary frequency, low blood pressure, and rapid heartbeat. His temperature has been normal. He saw his PCP, 3 days ago and was started on Cipro twice a day. He is taking this medication. There's been no vomiting, cough, chest pain. There are no other known modifying factors.    HPI  Past Medical History:  Diagnosis Date  . Brain tumor (benign) [facility]) March 2015   2 small tumors  . Carotid artery occlusion   . Cerebrovascular disease September 09, 2001   TIA, Left brain  . CKD (chronic kidney disease), stage III   . Diverticulosis   . ED (erectile dysfunction)   . Guillain-Barre syndrome ([place]) 1999  . Hx of elevated lipids   . Hyperlipidemia   . Hypertension   . Memory loss   . Stroke [facility]) 2003    Patient Active Problem List   Diagnosis Date Noted  . Occlusion and stenosis of carotid artery without mention of cerebral infarction 07/11/2012  . Carotid stenosis 07/11/2012    Past Surgical History:  Procedure Laterality Date  . BRAIN SURGERY  1998   Benign brain tumor removed, Left hemisphere- ? Meningioma  . CAROTID ENDARTERECTOMY Left September 13, 2001   LEFT cea  . CHOLECYSTECTOMY  Nov. 2001  . COLON SURGERY  Feb. 2003   Colonic polyps removed endoscopically  . COLONOSCOPY WITH PROPOFOL N/A 11/11/2015   Procedure: COLONOSCOPY WITH PROPOFOL;  Surgeon: Garlan Fair, MD;  Location: [facility] ENDOSCOPY;  Service: Endoscopy;  Laterality: N/A;  . INGUINAL HERNIA REPAIR  1970's       Home Medications    Prior to Admission medications   Medication Sig Start Date End Date Taking? Authorizing Provider  cholecalciferol (VITAMIN D) 1000 units tablet Take 1,000 Units by mouth daily.    Yes Historical Provider, MD  ciprofloxacin (CIPRO) 250 MG tablet Take 250 mg by mouth 2 (two) times daily.  03/04/16  Yes Historical Provider, MD  clopidogrel (PLAVIX) 75 MG tablet Take 75 mg by mouth daily.  02/09/16  Yes Historical Provider, MD  CRESTOR 40 MG tablet Take 40 mg by mouth daily.  06/19/12  Yes Historical Provider, MD  lisinopril-hydrochlorothiazide (PRINZIDE,ZESTORETIC) 10-12.5 MG tablet Take 1 tablet by mouth daily.  01/20/16  Yes Historical Provider, MD  vitamin B-12 (CYANOCOBALAMIN) 1000 MCG tablet Take 1,000 mcg by mouth 2 (two) times a week.    Yes Historical Provider, MD  amLODipine (NORVASC) 10 MG tablet Take 10 mg by mouth daily.    Historical Provider, MD    Family History Family History  Problem Relation Age of Onset  . Colon cancer Mother     Metastatic   . Cancer Mother     Colon  . Stroke Father   . Diabetes Father     Social History Social History  Substance Use Topics  . Smoking status: Former Smoker    Types: Cigarettes    Quit date: 05/17/1996  . Smokeless tobacco: Never Used  . Alcohol use No     Allergies   Asa [aspirin] and Immune globulins   Review of Systems Review of Systems  All other systems reviewed and are negative.  Physical Exam Updated Vital Signs BP 145/78   Pulse 104   Temp 97.6 F (36.4 C) (Oral)   Resp 26   Wt 221 lb (100.2 kg)   SpO2 93%   BMI 30.82 kg/m   Physical Exam  Constitutional: He is oriented to person, place, and time. He appears well-developed.  Elderly, frail  HENT:  Head: Normocephalic and atraumatic.  Right Ear: External ear normal.  Left Ear: External ear normal.  Eyes: Conjunctivae and EOM are normal. Pupils are equal, round, and reactive to light.  Neck: Normal range of motion and phonation normal. Neck supple.  Cardiovascular: Normal rate, regular rhythm and normal heart sounds.   Pulmonary/Chest: Effort normal and breath sounds normal. He exhibits no bony tenderness.  Abdominal: Soft.  There is tenderness (Suprapubic, mild).  Musculoskeletal: Normal range of motion. He exhibits no edema, tenderness or deformity.  Neurological: He is alert and oriented to person, place, and time. No cranial nerve deficit or sensory deficit. He exhibits normal muscle tone. Coordination normal.  Skin: Skin is warm, dry and intact.  Psychiatric: He has a normal mood and affect. His behavior is normal. Judgment and thought content normal.  Nursing note and vitals reviewed.    ED Treatments / Results  Labs (all labs ordered are listed, but only abnormal results are displayed) Labs Reviewed  BASIC METABOLIC PANEL - Abnormal; Notable for the following:       Result Value   Sodium 132 (*)    Potassium 3.4 (*)    Chloride 96 (*)    Glucose, Bld 146 (*)    BUN 45 (*)    Creatinine, Ser 2.49 (*)    Calcium 8.6 (*)    GFR calc non Af Amer 24 (*)    GFR calc Af Amer 28 (*)    All other components within normal limits  TROPONIN I - Abnormal; Notable for the following:    Troponin I 0.03 (*)    All other components within normal limits  URINALYSIS, ROUTINE W REFLEX MICROSCOPIC (NOT AT [facility]) - Abnormal; Notable for the following:    APPearance CLOUDY (*)    Hgb urine dipstick MODERATE (*)    Protein, ur 30 (*)    Leukocytes, UA TRACE (*)    All other components within normal limits  URINE MICROSCOPIC-ADD ON - Abnormal; Notable for the following:    Bacteria, UA RARE (*)    Casts GRANULAR CAST (*)    All other components within normal limits  URINE CULTURE  CULTURE, BLOOD (ROUTINE X 2)  CULTURE, BLOOD (ROUTINE X 2)  CBC WITH DIFFERENTIAL/PLATELET  I-STAT CG4 LACTIC ACID, ED  I-STAT CG4 LACTIC ACID, ED    BUN  Date Value Ref Range Status  03/06/2016 45 (H) 6 - 20 mg/dL Final   Creatinine, Ser  Date Value Ref Range Status  03/06/2016 2.49 (H) 0.61 - 1.24 mg/dL Final     EKG  EKG Interpretation  Date/Time:  Saturday March 06 2016 19:26:23 EDT Ventricular Rate:  150 PR  Interval:    QRS Duration: 84 QT Interval:  298 QTC Calculation: 471 R Axis:   -25 Text Interpretation:  Atrial fibrillation with rapid V-rate Borderline left axis deviation Anterior infarct, old Since last tracing rate faster and atrial fibrillation is new Confirmed by Eulis Foster  MD, Matsuko Kretz [phone]) on 03/06/2016 8:10:40 PM       Radiology Dg Chest Port 1 View  Result Date: 03/06/2016 CLINICAL DATA:  Lower abdominal pain starting Thursday  EXAM: PORTABLE CHEST 1 VIEW COMPARISON:  None. FINDINGS: Borderline cardiomegaly. Mild elevation of the right hemidiaphragm. No acute infiltrate or pleural effusion. No pulmonary edema. Degenerative changes bilateral shoulders. IMPRESSION: No active disease. Electronically Signed   By: Lahoma Crocker M.D.   On: 03/06/2016 20:22    Procedures Procedures (including critical care time)  Medications Ordered in ED Medications  metoprolol succinate (TOPROL-XL) 24 hr tablet 25 mg (not administered)  sodium chloride 0.9 % bolus 500 mL (0 mLs Intravenous Stopped 03/06/16 2026)  sodium chloride 0.9 % bolus 1,000 mL (0 mLs Intravenous Stopped 03/06/16 2149)    And  sodium chloride 0.9 % bolus 1,000 mL (0 mLs Intravenous Stopped 03/06/16 2058)    And  sodium chloride 0.9 % bolus 1,000 mL (0 mLs Intravenous Stopped 03/06/16 2300)    And  sodium chloride 0.9 % bolus 500 mL (500 mLs Intravenous New Bag/Given 03/06/16 2300)  cefTRIAXone (ROCEPHIN) 2 g in dextrose 5 % 50 mL IVPB (0 g Intravenous Stopped 03/06/16 2058)  metoprolol (LOPRESSOR) injection 5 mg (5 mg Intravenous Given 03/06/16 2300)     Initial Impression / Assessment and Plan / ED Course  I have reviewed the triage vital signs and the nursing notes.  Pertinent labs & imaging results that were available during my care of the patient were reviewed by me and considered in my medical decision making (see chart for details).  Clinical Course    Medications  metoprolol succinate (TOPROL-XL) 24 hr tablet  25 mg (not administered)  sodium chloride 0.9 % bolus 500 mL (0 mLs Intravenous Stopped 03/06/16 2026)  sodium chloride 0.9 % bolus 1,000 mL (0 mLs Intravenous Stopped 03/06/16 2149)    And  sodium chloride 0.9 % bolus 1,000 mL (0 mLs Intravenous Stopped 03/06/16 2058)    And  sodium chloride 0.9 % bolus 1,000 mL (0 mLs Intravenous Stopped 03/06/16 2300)    And  sodium chloride 0.9 % bolus 500 mL (500 mLs Intravenous New Bag/Given 03/06/16 2300)  cefTRIAXone (ROCEPHIN) 2 g in dextrose 5 % 50 mL IVPB (0 g Intravenous Stopped 03/06/16 2058)  metoprolol (LOPRESSOR) injection 5 mg (5 mg Intravenous Given 03/06/16 2300)    Patient Vitals for the past 24 hrs:  BP Temp Temp src Pulse Resp SpO2 Weight  03/06/16 2306 145/78 - - 104 26 93 % -  03/06/16 2305 - - - 102 26 93 % -  03/06/16 2300 145/78 - - (!) 131 23 94 % -  03/06/16 2240 - - - (!) 157 23 97 % -  03/06/16 2234 158/84 - - (!) 131 22 94 % -  03/06/16 2230 158/84 - - (!) 129 19 93 % -  03/06/16 2204 (!) 162/110 - - (!) 143 (!) 27 93 % -  03/06/16 2130 (!) 154/101 - - (!) 134 26 94 % -  03/06/16 2123 165/71 - - 120 25 94 % -  03/06/16 2115 165/71 - - - 25 97 % -  03/06/16 2100 145/74 - - - 18 - -  03/06/16 2030 148/76 - - - 21 - -  03/06/16 2000 154/89 - - (!) 131 22 94 % -  03/06/16 1944 145/82 - - - - - 221 lb (100.2 kg)  03/06/16 1941 - 97.6 F (36.4 C) Oral (!) 130 24 93 % -  03/06/16 1920 - (P) 97.9 F (36.6 C) (P) Oral (P) 80 - (P) 94 % -       This patients CHA2DS2-VASc Score  and unadjusted Ischemic Stroke Rate (% per year) is equal to 2.2 % stroke rate/year from a score of 2  Above score calculated as 1 point each if present [CHF, HTN, DM, Vascular=MI/PAD/Aortic Plaque, Age if 65-74, or Male] Above score calculated as 2 points each if present [Age > 75, or Stroke/TIA/TE]     11:31 PM Reevaluation with update and discussion. After initial assessment and treatment, an updated evaluation reveals He is comfortable,  now has heart rate around 104, still irregular and appears to be in atrial fibrillation. Patient and family updated on findings and plan. He agrees to admission. Kristol Almanzar L   00:50 AM-Consult complete with Hospitalist. Patient case explained and discussed. She agrees to admit patient for further evaluation and treatment. Call ended at [place] Performed by: Richarda Blade Total critical care time: 40 minutes Critical care time was exclusive of separately billable procedures and treating other patients. Critical care was necessary to treat or prevent imminent or life-threatening deterioration. Critical care was time spent personally by me on the following activities: development of treatment plan with patient and/or surrogate as well as nursing, discussions with consultants, evaluation of patient's response to treatment, examination of patient, obtaining history from patient or surrogate, ordering and performing treatments and interventions, ordering and review of laboratory studies, ordering and review of radiographic studies, pulse oximetry and re-evaluation of patient's condition.   Final Clinical Impressions(s) / ED Diagnoses   Final diagnoses:  Urinary tract infection without hematuria, site unspecified  Renal insufficiency  Atrial fibrillation, new onset (HCC)   Atrial fibrillation, new onset, likely secondary to UTI. Patient improved with IV fluids and a single dose of IV Lopressor.    New Prescriptions New Prescriptions   No medications on file     Daleen Bo, MD 03/07/16 0022    Daleen Bo, MD 03/07/16 [phone]

## 2016-03-06 NOTE — ED Notes (Signed)
MD at bedside. 

## 2016-03-07 ENCOUNTER — Observation Stay (HOSPITAL_COMMUNITY): Payer: PPO

## 2016-03-07 ENCOUNTER — Observation Stay (HOSPITAL_BASED_OUTPATIENT_CLINIC_OR_DEPARTMENT_OTHER): Payer: PPO

## 2016-03-07 DIAGNOSIS — Z79899 Other long term (current) drug therapy: Secondary | ICD-10-CM | POA: Diagnosis not present

## 2016-03-07 DIAGNOSIS — N390 Urinary tract infection, site not specified: Secondary | ICD-10-CM | POA: Diagnosis not present

## 2016-03-07 DIAGNOSIS — I4891 Unspecified atrial fibrillation: Secondary | ICD-10-CM

## 2016-03-07 DIAGNOSIS — I4821 Permanent atrial fibrillation: Secondary | ICD-10-CM | POA: Diagnosis present

## 2016-03-07 DIAGNOSIS — N289 Disorder of kidney and ureter, unspecified: Secondary | ICD-10-CM | POA: Diagnosis not present

## 2016-03-07 DIAGNOSIS — Z886 Allergy status to analgesic agent status: Secondary | ICD-10-CM | POA: Diagnosis not present

## 2016-03-07 DIAGNOSIS — N139 Obstructive and reflux uropathy, unspecified: Secondary | ICD-10-CM | POA: Diagnosis not present

## 2016-03-07 DIAGNOSIS — Z887 Allergy status to serum and vaccine status: Secondary | ICD-10-CM | POA: Diagnosis not present

## 2016-03-07 DIAGNOSIS — E876 Hypokalemia: Secondary | ICD-10-CM | POA: Diagnosis not present

## 2016-03-07 DIAGNOSIS — Z87891 Personal history of nicotine dependence: Secondary | ICD-10-CM | POA: Diagnosis not present

## 2016-03-07 DIAGNOSIS — Z8673 Personal history of transient ischemic attack (TIA), and cerebral infarction without residual deficits: Secondary | ICD-10-CM | POA: Diagnosis not present

## 2016-03-07 DIAGNOSIS — Z800 Family history of malignant neoplasm of digestive organs: Secondary | ICD-10-CM | POA: Diagnosis not present

## 2016-03-07 DIAGNOSIS — E785 Hyperlipidemia, unspecified: Secondary | ICD-10-CM | POA: Diagnosis not present

## 2016-03-07 DIAGNOSIS — A419 Sepsis, unspecified organism: Secondary | ICD-10-CM | POA: Diagnosis not present

## 2016-03-07 DIAGNOSIS — I10 Essential (primary) hypertension: Secondary | ICD-10-CM

## 2016-03-07 DIAGNOSIS — N179 Acute kidney failure, unspecified: Secondary | ICD-10-CM | POA: Diagnosis not present

## 2016-03-07 DIAGNOSIS — R338 Other retention of urine: Secondary | ICD-10-CM | POA: Diagnosis not present

## 2016-03-07 DIAGNOSIS — R1030 Lower abdominal pain, unspecified: Secondary | ICD-10-CM | POA: Diagnosis not present

## 2016-03-07 DIAGNOSIS — I959 Hypotension, unspecified: Secondary | ICD-10-CM | POA: Diagnosis not present

## 2016-03-07 DIAGNOSIS — Z7902 Long term (current) use of antithrombotics/antiplatelets: Secondary | ICD-10-CM | POA: Diagnosis not present

## 2016-03-07 DIAGNOSIS — R1084 Generalized abdominal pain: Secondary | ICD-10-CM | POA: Diagnosis not present

## 2016-03-07 DIAGNOSIS — R1010 Upper abdominal pain, unspecified: Secondary | ICD-10-CM | POA: Diagnosis not present

## 2016-03-07 DIAGNOSIS — Z823 Family history of stroke: Secondary | ICD-10-CM | POA: Diagnosis not present

## 2016-03-07 DIAGNOSIS — N183 Chronic kidney disease, stage 3 (moderate): Secondary | ICD-10-CM | POA: Diagnosis not present

## 2016-03-07 DIAGNOSIS — I129 Hypertensive chronic kidney disease with stage 1 through stage 4 chronic kidney disease, or unspecified chronic kidney disease: Secondary | ICD-10-CM | POA: Diagnosis not present

## 2016-03-07 DIAGNOSIS — R000 Tachycardia, unspecified: Secondary | ICD-10-CM | POA: Diagnosis not present

## 2016-03-07 DIAGNOSIS — Z86011 Personal history of benign neoplasm of the brain: Secondary | ICD-10-CM | POA: Diagnosis not present

## 2016-03-07 LAB — CBC WITH DIFFERENTIAL/PLATELET
BASOS ABS: 0 10*3/uL (ref 0.0–0.1)
Basophils Relative: 0 %
EOS PCT: 0 %
Eosinophils Absolute: 0 10*3/uL (ref 0.0–0.7)
HEMATOCRIT: 44.8 % (ref 39.0–52.0)
HEMOGLOBIN: 15.6 g/dL (ref 13.0–17.0)
LYMPHS PCT: 4 %
Lymphs Abs: 0.9 10*3/uL (ref 0.7–4.0)
MCH: 29.9 pg (ref 26.0–34.0)
MCHC: 34.8 g/dL (ref 30.0–36.0)
MCV: 85.8 fL (ref 78.0–100.0)
MONO ABS: 1.7 10*3/uL — AB (ref 0.1–1.0)
MONOS PCT: 8 %
Neutro Abs: 18.9 10*3/uL — ABNORMAL HIGH (ref 1.7–7.7)
Neutrophils Relative %: 88 %
Platelets: 135 10*3/uL — ABNORMAL LOW (ref 150–400)
RBC: 5.22 MIL/uL (ref 4.22–5.81)
RDW: 14 % (ref 11.5–15.5)
WBC: 21.5 10*3/uL — ABNORMAL HIGH (ref 4.0–10.5)

## 2016-03-07 LAB — T4, FREE: Free T4: 1.38 ng/dL — ABNORMAL HIGH (ref 0.61–1.12)

## 2016-03-07 LAB — APTT: APTT: 34 s (ref 24–36)

## 2016-03-07 LAB — CBC
HCT: 39.6 % (ref 39.0–52.0)
HEMOGLOBIN: 13.4 g/dL (ref 13.0–17.0)
MCH: 29.7 pg (ref 26.0–34.0)
MCHC: 33.8 g/dL (ref 30.0–36.0)
MCV: 87.8 fL (ref 78.0–100.0)
Platelets: 135 10*3/uL — ABNORMAL LOW (ref 150–400)
RBC: 4.51 MIL/uL (ref 4.22–5.81)
RDW: 14.2 % (ref 11.5–15.5)
WBC: 15.5 10*3/uL — ABNORMAL HIGH (ref 4.0–10.5)

## 2016-03-07 LAB — TROPONIN I
Troponin I: 0.04 ng/mL (ref <0.03)
Troponin I: 0.03 ng/mL (ref <0.03)
Troponin I: <0.03 ng/mL (ref <0.03)

## 2016-03-07 LAB — LACTIC ACID, PLASMA
LACTIC ACID, VENOUS: 1.2 mmol/L (ref 0.5–1.9)
LACTIC ACID, VENOUS: 1 mmol/L (ref 0.5–1.9)

## 2016-03-07 LAB — BRAIN NATRIURETIC PEPTIDE: B Natriuretic Peptide: 337.6 pg/mL — ABNORMAL HIGH (ref 0.0–100.0)

## 2016-03-07 LAB — PROTIME-INR
INR: 1.15
Prothrombin Time: 14.8 seconds (ref 11.4–15.2)

## 2016-03-07 LAB — PROCALCITONIN: PROCALCITONIN: 7.2 ng/mL

## 2016-03-07 LAB — ECHOCARDIOGRAM COMPLETE
HEIGHTINCHES: 70 in
WEIGHTICAEL: 3707.26 [oz_av]

## 2016-03-07 LAB — TSH: TSH: 1.671 u[IU]/mL (ref 0.350–4.500)

## 2016-03-07 LAB — HEPARIN LEVEL (UNFRACTIONATED): Heparin Unfractionated: 0.1 IU/mL — ABNORMAL LOW (ref 0.30–0.70)

## 2016-03-07 MED ORDER — TAMSULOSIN HCL 0.4 MG PO CAPS
0.4000 mg | ORAL_CAPSULE | Freq: Every day | ORAL | Status: DC
  Administered 2016-03-07 – 2016-03-08 (×2): 0.4 mg via ORAL
  Filled 2016-03-07 (×2): qty 1

## 2016-03-07 MED ORDER — DILTIAZEM HCL 30 MG PO TABS
30.0000 mg | ORAL_TABLET | Freq: Four times a day (QID) | ORAL | Status: DC
  Administered 2016-03-07 – 2016-03-08 (×7): 30 mg via ORAL
  Filled 2016-03-07 (×7): qty 1

## 2016-03-07 MED ORDER — DILTIAZEM HCL 25 MG/5ML IV SOLN
20.0000 mg | Freq: Once | INTRAVENOUS | Status: AC
  Administered 2016-03-07: 20 mg via INTRAVENOUS
  Filled 2016-03-07: qty 5

## 2016-03-07 MED ORDER — ACETAMINOPHEN 325 MG PO TABS
650.0000 mg | ORAL_TABLET | ORAL | Status: DC | PRN
  Administered 2016-03-08: 650 mg via ORAL
  Filled 2016-03-07: qty 2

## 2016-03-07 MED ORDER — DEXTROSE 5 % IV SOLN: 2.0000 g | Freq: Once | INTRAVENOUS | Status: DC

## 2016-03-07 MED ORDER — SODIUM CHLORIDE 0.9 % IV SOLN: INTRAVENOUS | Status: DC

## 2016-03-07 MED ORDER — ROSUVASTATIN CALCIUM 20 MG PO TABS
40.0000 mg | ORAL_TABLET | Freq: Every day | ORAL | Status: DC
  Administered 2016-03-07 – 2016-03-08 (×2): 40 mg via ORAL
  Filled 2016-03-07 (×2): qty 2

## 2016-03-07 MED ORDER — CLOPIDOGREL BISULFATE 75 MG PO TABS
75.0000 mg | ORAL_TABLET | Freq: Every day | ORAL | Status: DC
  Administered 2016-03-07 – 2016-03-08 (×2): 75 mg via ORAL
  Filled 2016-03-07 (×2): qty 1

## 2016-03-07 MED ORDER — HEPARIN (PORCINE) IN NACL 100-0.45 UNIT/ML-% IJ SOLN
1350.0000 [IU]/h | INTRAMUSCULAR | Status: DC
  Administered 2016-03-07: 1350 [IU]/h via INTRAVENOUS
  Filled 2016-03-07: qty 250

## 2016-03-07 MED ORDER — POTASSIUM CHLORIDE CRYS ER 20 MEQ PO TBCR
40.0000 meq | EXTENDED_RELEASE_TABLET | Freq: Once | ORAL | Status: AC
  Administered 2016-03-07: 40 meq via ORAL
  Filled 2016-03-07: qty 2

## 2016-03-07 MED ORDER — ONDANSETRON HCL 4 MG/2ML IJ SOLN: 4.0000 mg | Freq: Four times a day (QID) | INTRAMUSCULAR | Status: DC | PRN

## 2016-03-07 MED ORDER — HEPARIN (PORCINE) IN NACL 100-0.45 UNIT/ML-% IJ SOLN
1750.0000 [IU]/h | INTRAMUSCULAR | Status: DC
  Administered 2016-03-07 (×3): 1750 [IU]/h via INTRAVENOUS
  Filled 2016-03-07: qty 250

## 2016-03-07 MED ORDER — CEFTRIAXONE SODIUM 2 G IJ SOLR
2.0000 g | INTRAMUSCULAR | Status: DC
  Administered 2016-03-07: 2 g via INTRAVENOUS
  Filled 2016-03-07 (×2): qty 2

## 2016-03-07 NOTE — Progress Notes (Signed)
ANTICOAGULATION CONSULT NOTE - Initial Consult  Pharmacy Consult for Heparin Indication: atrial fibrillation  Allergies  Allergen Reactions  . Asa [Aspirin]     Severe skin bruising   . Immune Globulins Other (See Comments)    Tetanus Immunes Globulins    (  Pt. Cannot take the Flu shot ) [name]     Patient Measurements: Height: 5\' 10"  (177.8 cm) Weight: 231 lb 11.3 oz (105.1 kg) IBW/kg (Calculated) : 73 Heparin Dosing Weight: 95 kg  Vital Signs: Temp: 98.6 F (37 C) (10/22 0436) Temp Source: Oral (10/22 0436) BP: 130/80 (10/22 0436) Pulse Rate: 103 (10/22 0436)  Labs:  Recent Labs  03/06/16 1943 03/07/16 0522 03/07/16 0905 03/07/16 1321  HGB 15.6 13.4  --   --   HCT 44.8 39.6  --   --   PLT 135* 135*  --   --   APTT  --  34  --   --   LABPROT  --  14.8  --   --   INR  --  1.15  --   --   HEPARINUNFRC  --   --   --  0.10*  CREATININE 2.49*  --   --   --   TROPONINI 0.03* 0.04* 0.03*  --     Estimated Creatinine Clearance: 31.1 mL/min (by C-G formula based on SCr of 2.49 mg/dL (H)).   Medical History: Past Medical History:  Diagnosis Date  . Brain tumor (benign) [facility]) March 2015   2 small tumors  . Carotid artery occlusion   . Cerebrovascular disease September 09, 2001   TIA, Left brain  . CKD (chronic kidney disease), stage III   . Diverticulosis   . ED (erectile dysfunction)   . Guillain-Barre syndrome ([place]) 1999  . Hx of elevated lipids   . Hyperlipidemia   . Hypertension   . Memory loss   . Stroke [facility]) 2003    Medications:  Scheduled:  . cefTRIAXone (ROCEPHIN)  IV  2 g Intravenous Q24H  . clopidogrel  75 mg Oral Q breakfast  . diltiazem  30 mg Oral Q6H  . metoprolol succinate  25 mg Oral Daily  . rosuvastatin  40 mg Oral Daily   Infusions:  . heparin 1,350 Units/hr (03/07/16 0418)    Assessment: 75 yr male with new onset AFib with RVR.  PMH significant for CVA, left CEA, craniotomy for meningioma, HTN and CKD.  Pharmacy  consulted to dose IV heparin.  Spoke with Dr Eulas Post, who requested no heparin bolus throughout duration of heparin therapy due to potential concern for bleed.  Patient on no oral anticoagulation PTA.  Today, 03/07/16  Initial HL is 0.10, subtherapeutic  Hgb with AM labs, plt low but stable  Baseline PT, INR WNL  No reported bleeding issues  Goal of Therapy:  Heparin level 0.3-0.7 units/ml Monitor platelets by anticoagulation protocol: Yes   Plan:   No heparin bolus per MD orders  Increase  heparin to 1750 units/hr  Check heparin level 8 hr after rate change   Follow heparin level & CBC daily   [name], PharmD, BCPS Pager [phone] 03/07/2016 2:21 PM

## 2016-03-07 NOTE — Progress Notes (Signed)
*  PRELIMINARY RESULTS* Echocardiogram 2D Echocardiogram has been performed.  Beryle Beams 03/07/2016, 3:32 PM

## 2016-03-07 NOTE — Progress Notes (Signed)
Case discussed with Dr. Christella Noa, neurosurgery attending on call for Dr. [name].  Remote craniotomy and history of meningioma are not contraindications to anticoagulation.  IV heparin, without bolus, initiated per protocol upon admission for new onset atrial fibrillation, high risk patient.

## 2016-03-07 NOTE — Progress Notes (Signed)
Bladder scanned pt at 0300 due to little urine output in ED. Scan showed 613 cc. Night MD on floor, made her aware. Order placed for foley cathether.

## 2016-03-07 NOTE — Progress Notes (Signed)
Pharmacy Antibiotic Note  [name] is a 75 y.o. male admitted on 03/06/2016 with suspected sepsis due to UTI.   Patient received Ceftriaxone 2gm IV x 1 dose in the ED.  Pharmacy has been consulted for Ceftriaxone dosing.  Plan: Ceftriaxone 2gm IV q24h (BMI > 30) F/U cultures  Weight: 221 lb (100.2 kg)  Temp (24hrs), Avg:98 F (36.7 C), Min:97.6 F (36.4 C), Max:98.5 F (36.9 C)   Recent Labs Lab 03/06/16 1943 03/06/16 2335  WBC 21.5*  --   CREATININE 2.49*  --   LATICACIDVEN  --  1.92*    Estimated Creatinine Clearance: 30.9 mL/min (by C-G formula based on SCr of 2.49 mg/dL (H)).    Allergies  Allergen Reactions  . Asa [Aspirin]     Severe skin bruising   . Immune Globulins Other (See Comments)    Tetanus Immunes Globulins    (  Pt. Cannot take the Flu shot ) Guillian Barre     Antimicrobials this admission: 10/21 Ceftriaxone >>    Dose adjustments this admission:    Microbiology results: 10/21 BCx: sent 10/21 UCx: sent   Thank you for allowing pharmacy to be a part of this patient's care.  Everette Rank, PharmD 03/07/2016 3:05 AM

## 2016-03-07 NOTE — H&P (Addendum)
History and Physical    [name] QZR:[id number] DOB: 12-19-1940 DOA: 03/06/2016  PCP: Irven Shelling, MD   Patient coming from: Home  Chief Complaint: Tachycardia  HPI: [name] is a 75 y.o. gentleman with a history of prior CVA, left CEA, craniotomy for meningioma, HTN, HLD, and CKD 3 who has not felt well since Wednesday.  He had low grade fever, nausea, generalized weakness, and low blood pressures (wife reports systolic BP in the 35'K).  His wife actually called 911 on Wednesday, and the patient was evaluated in his home but he ultimately declined to come to the hospital.  He saw a provider in his PCP's office the next day, and was diagnosed with a UTI.  He was started on oral cipro, and his wife was advised to monitor his temperature and blood pressure at home.  The patient also started the antibiotic on Thursday night and has been taking it as prescribed.  This afternoon, she was checking his vital signs and noted that his heart rate was elevated to the 130's.  She called her PCP's office, and was advised to report to the ED for evaluation.  The patient has been found to be in atrial fibrillation with RVR.  The patient has not had chest pain, shortness of breath, or palpitations.  No light-headedness or LOC.  No syncope, no falls, no head trauma.   He as a history of multiple meningiomas, have had one removed in the late 1990's.  This surgery was complicated by infection, but it appears that he has been recovered and stable for years.  Last MRI in our system ordered by Dr. [name] was in 2015.  ED Course: Code Sepsis was activated.  The patient received IV Rocephin for history of UTI.  He received volume resuscitation with NS 30cc/kg per protocol.  Repeat blood and urine cultures are pending.  Lactic acid level 1.92.  Troponin 0.03.  Chest xray negative for acute process.  The patient saw transient improvement in his heart rates with IV lopressor 5mg  x one then oral  Toprol XL 25mg  daily (initiated by the ED).  Upon my assessment on the floor, the patient was starting to sustain heart rates greater than 105 again.  Improved to 90's with IV cardizem 20mg  one time.  Review of Systems: As per HPI otherwise 10 point review of systems negative.    Past Medical History:  Diagnosis Date  . Brain tumor (benign) [facility]) March 2015   2 small tumors  . Carotid artery occlusion   . Cerebrovascular disease September 09, 2001   TIA, Left brain  . CKD (chronic kidney disease), stage III   . Diverticulosis   . ED (erectile dysfunction)   . Guillain-Barre syndrome ([place]) 1999  . Hx of elevated lipids   . Hyperlipidemia   . Hypertension   . Memory loss   . Stroke [facility]) 2003    Past Surgical History:  Procedure Laterality Date  . BRAIN SURGERY  1998   Benign brain tumor removed, Left hemisphere- ? Meningioma  . CAROTID ENDARTERECTOMY Left September 13, 2001   LEFT cea  . CHOLECYSTECTOMY  Nov. 2001  . COLON SURGERY  Feb. 2003   Colonic polyps removed endoscopically  . COLONOSCOPY WITH PROPOFOL N/A 11/11/2015   Procedure: COLONOSCOPY WITH PROPOFOL;  Surgeon: Garlan Fair, MD;  Location: [facility];  Service: Endoscopy;  Laterality: N/A;  . INGUINAL HERNIA REPAIR  1970's     reports that he quit smoking about  19 years ago. His smoking use included Cigarettes. He has never used smokeless tobacco. He reports that he does not drink alcohol or use drugs.  He is married.  Allergies  Allergen Reactions  . Asa [Aspirin]     Severe skin bruising   . Immune Globulins Other (See Comments)    Tetanus Immunes Globulins    (  Pt. Cannot take the Flu shot ) Guillian Barre     Family History  Problem Relation Age of Onset  . Colon cancer Mother     Metastatic   . Cancer Mother     Colon  . Stroke Father   . Diabetes Father      Prior to Admission medications   Medication Sig Start Date End Date Taking? Authorizing Provider  cholecalciferol (VITAMIN D) 1000  units tablet Take 1,000 Units by mouth daily.   Yes Historical Provider, MD  ciprofloxacin (CIPRO) 250 MG tablet Take 250 mg by mouth 2 (two) times daily.  03/04/16  Yes Historical Provider, MD  clopidogrel (PLAVIX) 75 MG tablet Take 75 mg by mouth daily.  02/09/16  Yes Historical Provider, MD  CRESTOR 40 MG tablet Take 40 mg by mouth daily.  06/19/12  Yes Historical Provider, MD  vitamin B-12 (CYANOCOBALAMIN) 1000 MCG tablet Take 1,000 mcg by mouth 2 (two) times a week.    Yes Historical Provider, MD    Physical Exam: Vitals:   03/07/16 0010 03/07/16 0030 03/07/16 0103 03/07/16 0202  BP: 133/76 125/71 127/87 140/89  Pulse: 115 111 115 (!) 113  Resp:  (!) 27 24 18   Temp:    98.5 F (36.9 C)  TempSrc:    Oral  SpO2:  92% 92% 96%  Weight:    105.1 kg (231 lb 11.3 oz)  Height:    5\' 10"  (1.778 m)      Constitutional: NAD, calm, comfortable Vitals:   03/07/16 0010 03/07/16 0030 03/07/16 0103 03/07/16 0202  BP: 133/76 125/71 127/87 140/89  Pulse: 115 111 115 (!) 113  Resp:  (!) 27 24 18   Temp:    98.5 F (36.9 C)  TempSrc:    Oral  SpO2:  92% 92% 96%  Weight:    105.1 kg (231 lb 11.3 oz)  Height:    5\' 10"  (1.778 m)   Eyes: PERRL, lids and conjunctivae normal ENMT: Mucous membranes are moist. Posterior pharynx clear of any exudate or lesions. Normal dentition.  Neck: normal appearance, supple Respiratory: clear to auscultation bilaterally, no wheezing, no crackles. Normal respiratory effort. No accessory muscle use.  Cardiovascular: Tachycardic and irregular.  No extremity edema.  Cannot palpate posterior tibial pulses (this has been documented before per his wife).    GI: abdomen is protuberant but soft and compressible.  No tenderness.  Bowel sounds are present. Musculoskeletal:  No joint deformity in upper and lower extremities. Good ROM, no contractures. Normal muscle tone.  Skin: no rashes, pale, cool, dry Neurologic: No focal deficits. Psychiatric: Normal judgment and  insight. Alert and oriented x 3. Normal mood.     Labs on Admission: I have personally reviewed following labs and imaging studies  CBC:  Recent Labs Lab 03/06/16 1943  WBC 21.5*  NEUTROABS 18.9*  HGB 15.6  HCT 44.8  MCV 85.8  PLT 161*   Basic Metabolic Panel:  Recent Labs Lab 03/06/16 1943  NA 132*  K 3.4*  CL 96*  CO2 22  GLUCOSE 146*  BUN 45*  CREATININE 2.49*  CALCIUM 8.6*  GFR: Estimated Creatinine Clearance: 31.1 mL/min (by C-G formula based on SCr of 2.49 mg/dL (H)). Cardiac Enzymes:  Recent Labs Lab 03/06/16 1943  TROPONINI 0.03*   Urine analysis:    Component Value Date/Time   COLORURINE YELLOW 03/06/2016 1950   APPEARANCEUR CLOUDY (A) 03/06/2016 1950   LABSPEC 1.015 03/06/2016 1950   PHURINE 5.5 03/06/2016 1950   GLUCOSEU NEGATIVE 03/06/2016 1950   HGBUR MODERATE (A) 03/06/2016 [place] NEGATIVE 03/06/2016 [place] NEGATIVE 03/06/2016 1950   PROTEINUR 30 (A) 03/06/2016 1950   NITRITE NEGATIVE 03/06/2016 1950   LEUKOCYTESUR TRACE (A) 03/06/2016 1950   Sepsis Labs:  First lactic acid level in the ED 1.92, repeat pending  Radiological Exams on Admission: Dg Chest Port 1 View  Result Date: 03/06/2016 CLINICAL DATA:  Lower abdominal pain starting Thursday EXAM: PORTABLE CHEST 1 VIEW COMPARISON:  None. FINDINGS: Borderline cardiomegaly. Mild elevation of the right hemidiaphragm. No acute infiltrate or pleural effusion. No pulmonary edema. Degenerative changes bilateral shoulders. IMPRESSION: No active disease. Electronically Signed   By: Lahoma Crocker M.D.   On: 03/06/2016 20:22    EKG: Independently reviewed. Atrial fibrillation with RVR.  Assessment/Plan Principal Problem:   Atrial fibrillation, new onset (HCC) Active Problems:   UTI (urinary tract infection)   Renal insufficiency   Hypokalemia   History of CVA (cerebrovascular accident)   HTN (hypertension)   Atrial fibrillation ([place])      New onset atrial  fibrillation in the setting of sepsis --Rate control with PO cardizem and metoprolol --IV cardizem bolus prn.  Low threshold for transfer to the stepdown unit for drip if HR remains refractory or he becomes hemodynamically unstable --CHADS-Vasc score of at least six (age, prior CVA, HTN, probable PVD).  Wife nervous about anticoagulation due to his history of meningiomas and remote craniotomy 20 years ago.  I believe CVA risk outweighs bleeding risk at this time and I have explained this to her.  She has given verbal agreement to start IV heparin per protocol (we will NOT use boluses for now and I will discuss with neurosurgery on call before committing him to a more permanent form of anticoagulation). --Check TFTs, serial troponin --Echo in the AM --Cardiology consult in the AM  Sepsis secondary to UTI, WBC count still markedly elevated --Continue IV Rocephin (I discussed with his wife). --Blood and urine cultures pending --He has received adequate volume resuscitation and blood pressures are stable.  Hold maintenance fluids for now.  History of CVA --Continue plavix, statin for now  AKI (history of CKD, baseline creatinine unclear) --Follow BUN/Creatinine after volume resuscitation --Treating UTI --Catheter placed for acute urinary retention --Strict I/O --Will check renal ultrasound   DVT prophylaxis: Full anticoagulation with heparin per protocol (no boluses) Code Status: FULL Family Communication: Wife and son at bedside at time of admission on the floor. Disposition Plan: To be determined. Consults called: He will need cardiology consultation in the AM. Admission status: Observation, telemetry (He will need to convert to inpatient; I do not expect discharge by tomorrow, based on his diagnosis of sepsis).   TIME SPENT: 70 minutes  Eber Jones MD [facility] Pager [phone]  If 7PM-7AM, please contact night-coverage [email or web address] Password  [facility]  03/07/2016, 3:38 AM

## 2016-03-07 NOTE — Progress Notes (Signed)
PROGRESS NOTE  Ahlijah Raia  SHF:[id number] DOB: 1940-07-12 DOA: 03/06/2016 PCP: Irven Shelling, MD  Brief Narrative:   Mael Delap is a 75 y.o. gentleman with a history of prior CVA, left CEA, craniotomy for meningioma, HTN, HLD, and CKD 3 with unknown baseline creatinine who presented with a 4-day history of low grade fever, nausea, generalized weakness, and low blood pressures (wife reports systolic BP in the 50'Y).  He was given cipro for UTI as an outpatient but remained febrile, tachycardic, and hypotensive.  He developed worsening lower abdominal pain and came to the ER where he was found to have acute urinary retention, sepsis due to UTI, and a-fib with RVR.    Assessment & Plan:   Principal Problem:   Atrial fibrillation, new onset ([place]) Active Problems:   UTI (urinary tract infection)   Renal insufficiency   Hypokalemia   History of CVA (cerebrovascular accident)   HTN (hypertension)   Atrial fibrillation ([place])  New onset atrial fibrillation in the setting of sepsis, blood pressure borderline low, unclear when a-fib started --continue current doses of PO cardizem and metoprolol --CHADS-Vasc 6 - Continue heparin for now, but will likely need warfarin due to degree of CKD unless creatinine trends down with relief of his urinary obstruction - troponin negative -  TSH wnl, fT4 minimally elevated likely due to sepsis --Echo pending --Cardiology follow up in 4 weeks as outpatient for new onset a-fib, possible cardioversion  Sepsis secondary to UTI, WBC count trending down --Continue IV Rocephin --Blood and urine cultures pending  History of CVA --Continue plavix, statin for now  Elevated creatinine with history of CKD stage III per records but baseline creatinine unclear --Continue Catheter placed for acute urinary retention -  Start flomax -  RUS without obstruction -  Repeat creatinine in AM   DVT prophylaxis: Full anticoagulation with  heparin Code Status: FULL Family Communication:  patient alone, questions answered regarding a-fib, stroke risk, urinary obstruction and UTI Disposition Plan:  OOB with RN.  Anticipate discharge to home, possibly with home health services  Consultants:   none  Procedures:  none  Antimicrobials:   Ceftriaxone  10/21 at 2028   Subjective: Feeling better ever since catheter was placed.  Pain in the lower abdomen has resolved.  Had an episode of stool incontinence and diarrhea overnight.    Objective: Vitals:   03/07/16 0030 03/07/16 0103 03/07/16 0202 03/07/16 0436  BP: 125/71 127/87 140/89 130/80  Pulse: 111 115 (!) 113 (!) 103  Resp: (!) 27 24 18 18   Temp:   98.5 F (36.9 C) 98.6 F (37 C)  TempSrc:   Oral Oral  SpO2: 92% 92% 96% 96%  Weight:   105.1 kg (231 lb 11.3 oz)   Height:   5\' 10"  (1.778 m)     Intake/Output Summary (Last 24 hours) at 03/07/16 1449 Last data filed at 03/07/16 0900  Gross per 24 hour  Intake           192.95 ml  Output             3400 ml  Net         -3207.05 ml   Filed Weights   03/06/16 1944 03/07/16 0202  Weight: 100.2 kg (221 lb) 105.1 kg (231 lb 11.3 oz)    Examination:  General exam:  Adult male.  No acute distress.  HEENT:  NCAT, MMM Respiratory system: Clear to auscultation bilaterally Cardiovascular system: IRRR, normal S1/S2. No murmurs,  rubs, gallops or clicks.  Warm extremities Gastrointestinal system: Normal active bowel sounds, soft, nondistended, nontender. MSK:  Normal tone and bulk, no lower extremity edema Neuro:  Grossly intact    Data Reviewed: I have personally reviewed following labs and imaging studies  CBC:  Recent Labs Lab 03/06/16 1943 03/07/16 0522  WBC 21.5* 15.5*  NEUTROABS 18.9*  --   HGB 15.6 13.4  HCT 44.8 39.6  MCV 85.8 87.8  PLT 135* 563*   Basic Metabolic Panel:  Recent Labs Lab 03/06/16 1943  NA 132*  K 3.4*  CL 96*  CO2 22  GLUCOSE 146*  BUN 45*  CREATININE 2.49*  CALCIUM  8.6*   GFR: Estimated Creatinine Clearance: 31.1 mL/min (by C-G formula based on SCr of 2.49 mg/dL (H)). Liver Function Tests: No results for input(s): AST, ALT, ALKPHOS, BILITOT, PROT, ALBUMIN in the last 168 hours. No results for input(s): LIPASE, AMYLASE in the last 168 hours. No results for input(s): AMMONIA in the last 168 hours. Coagulation Profile:  Recent Labs Lab 03/07/16 0522  INR 1.15   Cardiac Enzymes:  Recent Labs Lab 03/06/16 1943 03/07/16 0522 03/07/16 0905 03/07/16 1321  TROPONINI 0.03* 0.04* 0.03* <0.03   BNP (last 3 results) No results for input(s): PROBNP in the last 8760 hours. HbA1C: No results for input(s): HGBA1C in the last 72 hours. CBG: No results for input(s): GLUCAP in the last 168 hours. Lipid Profile: No results for input(s): CHOL, HDL, LDLCALC, TRIG, CHOLHDL, LDLDIRECT in the last 72 hours. Thyroid Function Tests:  Recent Labs  03/07/16 0522  TSH 1.671  FREET4 1.38*   Anemia Panel: No results for input(s): VITAMINB12, FOLATE, FERRITIN, TIBC, IRON, RETICCTPCT in the last 72 hours. Urine analysis:    Component Value Date/Time   COLORURINE YELLOW 03/06/2016 1950   APPEARANCEUR CLOUDY (A) 03/06/2016 1950   LABSPEC 1.015 03/06/2016 1950   PHURINE 5.5 03/06/2016 1950   GLUCOSEU NEGATIVE 03/06/2016 1950   HGBUR MODERATE (A) 03/06/2016 [place] NEGATIVE 03/06/2016 [place] NEGATIVE 03/06/2016 1950   PROTEINUR 30 (A) 03/06/2016 1950   NITRITE NEGATIVE 03/06/2016 1950   LEUKOCYTESUR TRACE (A) 03/06/2016 1950   Sepsis Labs: @LABRCNTIP (procalcitonin:4,lacticidven:4)  )No results found for this or any previous visit (from the past 240 hour(s)).    Radiology Studies: US Renal  Result Date: 03/07/2016 CLINICAL DATA:  Acute kidney injury, hypertension, hyperlipidemia EXAM: RENAL / URINARY TRACT ULTRASOUND COMPLETE COMPARISON:  None available FINDINGS: Right Kidney: Length: 11.4 cm. Mild increased echogenicity. No  hydronephrosis. Right upper pole exophytic hypoechoic cyst measures 2 x 2 x 2 cm. Additional medial upper pole exophytic cyst measures 3.8 x 3.0 x 3.2 cm. Left Kidney: Length: 13.3 cm. Slight increased echogenicity. No obstruction or hydronephrosis. Upper pole hypoechoic exophytic cyst measures 2.5 x 2.9 x 3.4 cm. Bladder: Decompressed by Foley catheter. IMPRESSION: No acute finding or hydronephrosis. Bilateral renal cysts as above. Bladder is decompressed by Foley. Electronically Signed   By: Jerilynn Mages.  Shick M.D.   On: 03/07/2016 12:39   Dg Chest Port 1 View  Result Date: 03/06/2016 CLINICAL DATA:  Lower abdominal pain starting Thursday EXAM: PORTABLE CHEST 1 VIEW COMPARISON:  None. FINDINGS: Borderline cardiomegaly. Mild elevation of the right hemidiaphragm. No acute infiltrate or pleural effusion. No pulmonary edema. Degenerative changes bilateral shoulders. IMPRESSION: No active disease. Electronically Signed   By: Lahoma Crocker M.D.   On: 03/06/2016 20:22     Scheduled Meds: . cefTRIAXone (ROCEPHIN)  IV  2  g Intravenous Q24H  . clopidogrel  75 mg Oral Q breakfast  . diltiazem  30 mg Oral Q6H  . metoprolol succinate  25 mg Oral Daily  . rosuvastatin  40 mg Oral Daily   Continuous Infusions: . heparin       LOS: 0 days    Time spent: 30 min    Janece Canterbury, MD [facility] Pager [phone]  If 7PM-7AM, please contact night-coverage [email or web address] Password [facility] 03/07/2016, 2:49 PM

## 2016-03-07 NOTE — Progress Notes (Signed)
ANTICOAGULATION CONSULT NOTE - Initial Consult  Pharmacy Consult for Heparin Indication: atrial fibrillation  Allergies  Allergen Reactions  . Asa [Aspirin]     Severe skin bruising   . Immune Globulins Other (See Comments)    Tetanus Immunes Globulins    (  Pt. Cannot take the Flu shot ) [name]     Patient Measurements: Height: 5\' 10"  (177.8 cm) Weight: 231 lb 11.3 oz (105.1 kg) IBW/kg (Calculated) : 73 Heparin Dosing Weight: 95 kg  Vital Signs: Temp: 98.5 F (36.9 C) (10/22 0202) Temp Source: Oral (10/22 0202) BP: 140/89 (10/22 0202) Pulse Rate: 113 (10/22 0202)  Labs:  Recent Labs  03/06/16 1943  HGB 15.6  HCT 44.8  PLT 135*  CREATININE 2.49*  TROPONINI 0.03*    Estimated Creatinine Clearance: 31.1 mL/min (by C-G formula based on SCr of 2.49 mg/dL (H)).   Medical History: Past Medical History:  Diagnosis Date  . Brain tumor (benign) [facility]) March 2015   2 small tumors  . Carotid artery occlusion   . Cerebrovascular disease September 09, 2001   TIA, Left brain  . CKD (chronic kidney disease), stage III   . Diverticulosis   . ED (erectile dysfunction)   . Guillain-Barre syndrome ([place]) 1999  . Hx of elevated lipids   . Hyperlipidemia   . Hypertension   . Memory loss   . Stroke [facility]) 2003    Medications:  Scheduled:  . cefTRIAXone (ROCEPHIN)  IV  2 g Intravenous Q24H  . clopidogrel  75 mg Oral Q breakfast  . diltiazem  30 mg Oral Q6H  . metoprolol succinate  25 mg Oral Daily  . rosuvastatin  40 mg Oral Daily   Infusions:  . heparin      Assessment: 75 yr male with new onset AFib with RVR.  PMH significant for CVA, left CEA, craniotomy for meningioma, HTN and CKD.  Pharmacy consulted to dose IV heparin.  Spoke with Dr Eulas Post, who requested no heparin bolus throughout duration of heparin therapy due to potential concern for bleed.  Patient on no oral anticoagulation PTA.  Goal of Therapy:  Heparin level 0.3-0.7 units/ml Monitor platelets by  anticoagulation protocol: Yes   Plan:   Baseline PT/INR, CBC, aPTT  No heparin bolus  Begin IV heparin @ 1350 units/hr  Check heparin level 8 hr after heparin started  Follow heparin level & CBC daily  Courtlyn Aki, Toribio Harbour, PharmD 03/07/2016,3:55 AM

## 2016-03-08 LAB — CBC
HCT: 38.9 % — ABNORMAL LOW (ref 39.0–52.0)
HEMOGLOBIN: 13 g/dL (ref 13.0–17.0)
MCH: 29.5 pg (ref 26.0–34.0)
MCHC: 33.4 g/dL (ref 30.0–36.0)
MCV: 88.4 fL (ref 78.0–100.0)
PLATELETS: 160 10*3/uL (ref 150–400)
RBC: 4.4 MIL/uL (ref 4.22–5.81)
RDW: 14.6 % (ref 11.5–15.5)
WBC: 13.3 10*3/uL — AB (ref 4.0–10.5)

## 2016-03-08 LAB — HEMOGLOBIN A1C
Hgb A1c MFr Bld: 6.2 % — ABNORMAL HIGH (ref 4.8–5.6)
MEAN PLASMA GLUCOSE: 131 mg/dL

## 2016-03-08 LAB — HEPARIN LEVEL (UNFRACTIONATED)
HEPARIN UNFRACTIONATED: 0.3 [IU]/mL (ref 0.30–0.70)
Heparin Unfractionated: 0.43 IU/mL (ref 0.30–0.70)

## 2016-03-08 LAB — BASIC METABOLIC PANEL
ANION GAP: 8 (ref 5–15)
BUN: 38 mg/dL — AB (ref 6–20)
CHLORIDE: 111 mmol/L (ref 101–111)
CO2: 22 mmol/L (ref 22–32)
Calcium: 7.8 mg/dL — ABNORMAL LOW (ref 8.9–10.3)
Creatinine, Ser: 2.06 mg/dL — ABNORMAL HIGH (ref 0.61–1.24)
GFR calc Af Amer: 35 mL/min — ABNORMAL LOW (ref >60)
GFR, EST NON AFRICAN AMERICAN: 30 mL/min — AB (ref >60)
Glucose, Bld: 123 mg/dL — ABNORMAL HIGH (ref 65–99)
POTASSIUM: 3.3 mmol/L — AB (ref 3.5–5.1)
SODIUM: 141 mmol/L (ref 135–145)

## 2016-03-08 LAB — URINE CULTURE: CULTURE: NO GROWTH

## 2016-03-08 MED ORDER — WARFARIN SODIUM 5 MG PO TABS: 5.0000 mg | ORAL_TABLET | Freq: Every day | ORAL | 0 refills | Status: DC

## 2016-03-08 MED ORDER — TAMSULOSIN HCL 0.4 MG PO CAPS: 0.4000 mg | ORAL_CAPSULE | Freq: Every day | ORAL | 0 refills | Status: DC

## 2016-03-08 MED ORDER — MUSCLE RUB 10-15 % EX CREA
TOPICAL_CREAM | CUTANEOUS | Status: DC | PRN
  Filled 2016-03-08: qty 85

## 2016-03-08 MED ORDER — WARFARIN - PHARMACIST DOSING INPATIENT
Freq: Every day | Status: DC
Start: 2016-03-08 — End: 2016-03-08

## 2016-03-08 MED ORDER — WARFARIN SODIUM 5 MG PO TABS
5.0000 mg | ORAL_TABLET | Freq: Once | ORAL | Status: AC
  Administered 2016-03-08: 5 mg via ORAL
  Filled 2016-03-08: qty 1

## 2016-03-08 MED ORDER — ENOXAPARIN SODIUM 150 MG/ML ~~LOC~~ SOLN: 150.0000 mg | SUBCUTANEOUS | 0 refills | Status: DC

## 2016-03-08 MED ORDER — METOPROLOL TARTRATE 50 MG PO TABS: 50.0000 mg | ORAL_TABLET | Freq: Two times a day (BID) | ORAL | 0 refills | Status: DC

## 2016-03-08 MED ORDER — COUMADIN BOOK
Freq: Once | Status: AC
  Administered 2016-03-08: 1
  Filled 2016-03-08: qty 1

## 2016-03-08 MED ORDER — WARFARIN VIDEO: Freq: Once | Status: DC

## 2016-03-08 MED ORDER — ENOXAPARIN (LOVENOX) PATIENT EDUCATION KIT
PACK | Freq: Once | Status: AC
  Administered 2016-03-08: 14:00:00
  Filled 2016-03-08: qty 1

## 2016-03-08 MED ORDER — POTASSIUM CHLORIDE CRYS ER 20 MEQ PO TBCR
40.0000 meq | EXTENDED_RELEASE_TABLET | Freq: Once | ORAL | Status: AC
  Administered 2016-03-08: 40 meq via ORAL
  Filled 2016-03-08: qty 2

## 2016-03-08 MED ORDER — DILTIAZEM HCL ER COATED BEADS 120 MG PO CP24: 120.0000 mg | ORAL_CAPSULE | Freq: Every day | ORAL | 0 refills | Status: DC

## 2016-03-08 MED ORDER — ENOXAPARIN SODIUM 100 MG/ML ~~LOC~~ SOLN
100.0000 mg | Freq: Two times a day (BID) | SUBCUTANEOUS | Status: DC
  Administered 2016-03-08: 100 mg via SUBCUTANEOUS
  Filled 2016-03-08: qty 1

## 2016-03-08 MED ORDER — CIPROFLOXACIN HCL 500 MG PO TABS: 500.0000 mg | ORAL_TABLET | Freq: Two times a day (BID) | ORAL | 0 refills | Status: DC

## 2016-03-08 NOTE — Evaluation (Signed)
Physical Therapy Evaluation Patient Details Name: [name] MRN: [id number] DOB: 12-05-1940 Today's Date: 03/08/2016   History of Present Illness  75 y.o. male admitted with fever, weakness, nausea, hypotension. Dx of UTI, sepsis, renal insufficiency. PMH of a fib, CVA, HTN, L CEA, craniotomy 2* meningioma  Clinical Impression  Pt admitted with above diagnosis. Pt currently with functional limitations due to the deficits listed below (see PT Problem List). Pt reports 8/10 L knee pain, onset last night. He stated he's had flares of L knee pain intermittently for past 9 months, denies injury to knee.  He ambulated 150' with RW.  At baseline he ambulates without an assistive device and can perform heavy yard work activities.  Pt will benefit from skilled PT to increase their independence and safety with mobility to allow discharge to the venue listed below.       Follow Up Recommendations No PT follow up    Equipment Recommendations  None recommended by PT    Recommendations for Other Services       Precautions / Restrictions Precautions Precautions: None Precaution Comments: pt denies h/o falls in past 1 year Restrictions Weight Bearing Restrictions: No      Mobility  Bed Mobility Overal bed mobility: Needs Assistance Bed Mobility: Supine to Sit     Supine to sit: Min assist     General bed mobility comments: min A to raise trunk  Transfers Overall transfer level: Needs assistance Equipment used: Rolling walker (2 wheeled) Transfers: Sit to/from Stand Sit to Stand: Min assist         General transfer comment: min A to rise  Ambulation/Gait Ambulation/Gait assistance: Min guard Ambulation Distance (Feet): 150 Feet Assistive device: Rolling walker (2 wheeled) Gait Pattern/deviations: Step-through pattern;Decreased step length - right;Decreased step length - left;Antalgic;Trunk flexed   Gait velocity interpretation: Below normal speed for  age/gender General Gait Details: 8/10 L knee pain with walking, VCs for positioning in RW and for posture  Stairs            Wheelchair Mobility    Modified Rankin (Stroke Patients Only)       Balance Overall balance assessment: Modified Independent                                           Pertinent Vitals/Pain Pain Assessment: 0-10 Pain Score: 8  Pain Location: L knee with walking Pain Descriptors / Indicators: Sore Pain Intervention(s): Limited activity within patient's tolerance;Monitored during session;Heat applied (pt declined pain medication)    Home Living Family/patient expects to be discharged to:: Private residence Living Arrangements: Spouse/significant other Available Help at Discharge: Family Type of Home: House Home Access: Stairs to enter   [employer] of Steps: 2 Home Layout: Two level;Able to live on main level with bedroom/bathroom Home Equipment: Bedside commode Additional Comments: can borrow walker    Prior Function Level of Independence: Independent         Comments: did yardwork, very active PTA     Hand Dominance        Extremity/Trunk Assessment   Upper Extremity Assessment: Overall WFL for tasks assessed           Lower Extremity Assessment: LLE deficits/detail   LLE Deficits / Details: pt reports onset of L knee pain last night, knee extension 3/5, can flex to at least 90*, pain with palpation to superior patella,  pt states he's had knee pain like this for the past 8-9 months, it comes and goes, he uses [employer] at home  Cervical / Trunk Assessment: Normal  Communication   Communication: No difficulties  Cognition Arousal/Alertness: Awake/alert Behavior During Therapy: WFL for tasks assessed/performed Overall Cognitive Status: Within Functional Limits for tasks assessed                      General Comments      Exercises     Assessment/Plan    PT Assessment Patient needs  continued PT services  PT Problem List Decreased strength;Decreased activity tolerance;Decreased knowledge of use of DME;Decreased mobility;Pain          PT Treatment Interventions DME instruction;Gait training;Stair training;Functional mobility training;Therapeutic exercise;Therapeutic activities;Patient/family education    PT Goals (Current goals can be found in the Care Plan section)  Acute Rehab PT Goals Patient Stated Goal: return to doing yardwork PT Goal Formulation: With patient/family Time For Goal Achievement: 03/22/16 Potential to Achieve Goals: Good    Frequency Min 3X/week   Barriers to discharge        Co-evaluation               End of Session Equipment Utilized During Treatment: Gait belt Activity Tolerance: Patient limited by pain Patient left: in chair;with call bell/phone within reach;with family/visitor present Nurse Communication: Mobility status         Time: 7948-0165 PT Time Calculation (min) (ACUTE ONLY): 26 min   Charges:   PT Evaluation $PT Eval Low Complexity: 1 Procedure PT Treatments $Gait Training: 8-22 mins   PT G Codes:        Philomena Doheny 03/08/2016, 1:56 PM [phone]

## 2016-03-08 NOTE — Progress Notes (Signed)
Patient and patient's wife were both educated on Lovenox administration and foley care.  All discharge instructions and handouts given with no further questions.

## 2016-03-08 NOTE — Discharge Summary (Signed)
Physician Discharge Summary  [name] PJA:[id number] DOB: 07-30-1940 DOA: 03/06/2016  PCP: Irven Shelling, MD  Admit date: 03/06/2016 Discharge date: 03/08/2016  Admitted From: home  Disposition:  home  Recommendations for Outpatient Follow-up:  1. Follow up with PCP in 1-2 weeks 2. Please obtain BMP/CBC in one week.  Repeat TFTs in 3-4 weeks 3. Please follow up on the following pending results:  Home Health:   none Equipment/Devices:  none  Discharge Condition:  Stable, improved CODE STATUS:  Full  Diet recommendation:  Healthy heart   Brief/Interim Summary:  [name] a 75 y.o.gentleman with a history of prior CVA, left CEA, craniotomy for meningioma, HTN, HLD, and CKD 3 with unknown baseline creatinine who presented with a 4-day history of low grade fever, nausea, generalized weakness, and low blood pressures (wife reports systolic BP in the 34'L). He was given cipro for UTI as an outpatient but remained febrile, tachycardic, and hypotensive.  He developed worsening lower abdominal pain and came to the ER where he was found to have acute urinary retention, sepsis due to UTI, and a-fib with RVR.  He had a foley catheter placed and he was started on flomax.  He will need to follow up with Urology for catheter removal.  He was treated with ceftriaxone, but his urine culture was ultimately negative.  I suspect he was partially treated by his ciprofloxacin he was taking prior to admission.  Recommend that he resume his ciprofloxacin at high dose at discharge to complete a 7-day course.  His fevers and WBC improved.  His heart rate improved with IV fluids, antibiotics, and initiation of metoprolol and diltiazem.  He was also started on coumadin, which he has taken before under the supervision of Dr. Laurann Montana.  Because cardiology may consider cardioversion, I recommended he bridge with lovenox to make sure he is fully anticoagulated continuously from hospitalization  until follow up with them so that any cardioversion procedures need not be delayed.     Discharge Diagnoses:  Principal Problem:   Atrial fibrillation, new onset ([place]) Active Problems:   Urinary tract infection without hematuria   Renal insufficiency   Hypokalemia   History of CVA (cerebrovascular accident)   HTN (hypertension)   Atrial fibrillation ([place])   Acute urinary retention   New onset atrial fibrillation in the setting of sepsis, blood pressure low, unclear when a-fib started but reportedly he has been fatigued for several weeks prior to admission.   --started cardizem and metoprolol -  HR per telemetry has been in the mid-90s --CHADS-Vasc 6  -  Started on warfarin due to borderline CKD stage 4 -  Bridge with lovenox in case cardiology would like to pursue cardioversion attempt - troponin negative -  TSH wnl --Echo demonstrated no valvular abnormalities, preserved EF --Cardiology follow up in 4 weeks as outpatient  Sepsis secondary to UTI, WBC count trended down --IV Rocephin converted to ciprofloxacin at discharge to complete a 7-day course --Blood cultures NGTD -  Urine culture negative (taken after several days of antibiotics)  History of CVA, plavix discontinued due to starting full anticoagulation.  Continue statin.  Acute on chronic kidney disease stage III per records but baseline creatinine unclear.  Creatinine trended down after relief of obstruction  Acute urinary obstruction -- Continue catheter placed for acute urinary retention -  Started flomax -  RUS without hydronephrosis  Discharge Instructions  Discharge Instructions    Call MD for:  difficulty breathing, headache or visual disturbances  Complete by:  As directed    Call MD for:  extreme fatigue    Complete by:  As directed    Call MD for:  hives    Complete by:  As directed    Call MD for:  persistant dizziness or light-headedness    Complete by:  As directed    Call MD for:   persistant nausea and vomiting    Complete by:  As directed    Call MD for:  severe uncontrolled pain    Complete by:  As directed    Call MD for:  temperature >100.4    Complete by:  As directed    Diet - low sodium heart healthy    Complete by:  As directed    Increase activity slowly    Complete by:  As directed        Medication List    STOP taking these medications   clopidogrel 75 MG tablet Commonly known as:  PLAVIX     TAKE these medications   cholecalciferol 1000 units tablet Commonly known as:  VITAMIN D Take 1,000 Units by mouth daily.   ciprofloxacin 500 MG tablet Commonly known as:  CIPRO Take 1 tablet (500 mg total) by mouth 2 (two) times daily. What changed:  medication strength  how much to take   CRESTOR 40 MG tablet Generic drug:  rosuvastatin Take 40 mg by mouth daily.   diltiazem 120 MG 24 hr capsule Commonly known as:  CARDIZEM CD Take 1 capsule (120 mg total) by mouth daily.   enoxaparin 150 MG/ML injection Commonly known as:  LOVENOX Inject 1 mL (150 mg total) into the skin daily.   metoprolol 50 MG tablet Commonly known as:  LOPRESSOR Take 1 tablet (50 mg total) by mouth 2 (two) times daily.   tamsulosin 0.4 MG Caps capsule Commonly known as:  FLOMAX Take 1 capsule (0.4 mg total) by mouth daily after supper.   vitamin B-12 1000 MCG tablet Commonly known as:  CYANOCOBALAMIN Take 1,000 mcg by mouth 2 (two) times a week.   warfarin 5 MG tablet Commonly known as:  COUMADIN Take 1 tablet (5 mg total) by mouth daily at 6 PM.      Follow-up Information    Irven Shelling, MD Follow up on 03/12/2016.   Specialty:  Internal Medicine Why:  INR check 8:30-11:30 on 03/12/2016 AND appt with Dr. Laurann Montana on Oct 31st at 1:45PM Contact information: [address] [place] [address] [phone]        [facility]. Schedule an appointment as soon as possible for a  visit in 3 weeks.   Why:  new-onset atrial fibrillation Contact information: [place] [address] [phone]       [place]. Schedule an appointment as soon as possible for a visit in 2 weeks.   Why:  acute urinary retention.  discharged with foley catheter Contact information: [place] [phone]       Go to to follow up.   Why:  your appointment is on 03/22/16 at 8am for at 8:30am appointment at [facility].         Allergies  Allergen Reactions  . Asa [Aspirin]     Severe skin bruising   . Immune Globulins Other (See Comments)    Tetanus Immunes Globulins    (  Pt. Cannot take the Flu  shot ) [name]     Consultations: none   Procedures/Studies: US Renal  Result Date: 03/07/2016 CLINICAL DATA:  Acute kidney injury, hypertension, hyperlipidemia EXAM: RENAL / URINARY TRACT ULTRASOUND COMPLETE COMPARISON:  None available FINDINGS: Right Kidney: Length: 11.4 cm. Mild increased echogenicity. No hydronephrosis. Right upper pole exophytic hypoechoic cyst measures 2 x 2 x 2 cm. Additional medial upper pole exophytic cyst measures 3.8 x 3.0 x 3.2 cm. Left Kidney: Length: 13.3 cm. Slight increased echogenicity. No obstruction or hydronephrosis. Upper pole hypoechoic exophytic cyst measures 2.5 x 2.9 x 3.4 cm. Bladder: Decompressed by Foley catheter. IMPRESSION: No acute finding or hydronephrosis. Bilateral renal cysts as above. Bladder is decompressed by Foley. Electronically Signed   By: Jerilynn Mages.  Shick M.D.   On: 03/07/2016 12:39   Dg Chest Port 1 View  Result Date: 03/06/2016 CLINICAL DATA:  Lower abdominal pain starting Thursday EXAM: PORTABLE CHEST 1 VIEW COMPARISON:  None. FINDINGS: Borderline cardiomegaly. Mild elevation of the right hemidiaphragm. No acute infiltrate or pleural effusion. No pulmonary edema. Degenerative changes bilateral shoulders.  IMPRESSION: No active disease. Electronically Signed   By: Lahoma Crocker M.D.   On: 03/06/2016 20:22    Subjective: Feeling much better.  Abdominal pain has resolved.  Denies chest pains, shortness of breath, nausea, vomiting, diarrhea. Worried catheter may be removed prior to discharge.    Discharge Exam: Vitals:   03/08/16 1349 03/08/16 1454  BP:  110/65  Pulse: (!) 132 (!) 109  Resp:  (!) 22  Temp:  98 F (36.7 C)   Vitals:   03/07/16 2149 03/08/16 0528 03/08/16 1349 03/08/16 1454  BP: 116/66 107/61  110/65  Pulse: 97 92 (!) 132 (!) 109  Resp: 18 18  (!) 22  Temp: 98.3 F (36.8 C) 97.9 F (36.6 C)  98 F (36.7 C)  TempSrc: Oral Oral  Oral  SpO2: 96% 92%  96%  Weight:  105 kg (231 lb 7.7 oz)    Height:         General exam:  Adult male.  No acute distress.  HEENT:  NCAT, MMM Respiratory system: Clear to auscultation bilaterally Cardiovascular system: IRRR, normal S1/S2. No murmurs, rubs, gallops or clicks.  Warm extremities Gastrointestinal system: Normal active bowel sounds, soft, nondistended, nontender. MSK:  Normal tone and bulk, no lower extremity edema Neuro:  Grossly intact GU:  Foley catheter with dark yellow urine    The results of significant diagnostics from this hospitalization (including imaging, microbiology, ancillary and laboratory) are listed below for reference.     Microbiology: Recent Results (from the past 240 hour(s))  Urine culture     Status: None   Collection Time: 03/06/16  7:50 PM  Result Value Ref Range Status   Specimen Description URINE, CLEAN CATCH  Final   Special Requests NONE  Final   Culture NO GROWTH Performed at [facility]   Final   Report Status 03/08/2016 FINAL  Final  Blood Culture (routine x 2)     Status: None (Preliminary result)   Collection Time: 03/06/16  8:23 PM  Result Value Ref Range Status   Specimen Description BLOOD R WRIST  Final   Special Requests BOTTLES DRAWN AEROBIC AND ANAEROBIC 5ML EA   Final   Culture   Final    NO GROWTH 1 DAY Performed at [facility]    Report Status PENDING  Incomplete  Blood Culture (routine x 2)     Status: None (Preliminary result)  Collection Time: 03/06/16  9:04 PM  Result Value Ref Range Status   Specimen Description BLOOD LEFT ANTECUBITAL  Final   Special Requests BOTTLES DRAWN AEROBIC AND ANAEROBIC 6CC EA  Final   Culture   Final    NO GROWTH 1 DAY Performed at [facility]    Report Status PENDING  Incomplete     Labs: BNP (last 3 results)  Recent Labs  03/07/16 0522  BNP 546.5*   Basic Metabolic Panel:  Recent Labs Lab 03/06/16 1943 03/08/16 0140  NA 132* 141  K 3.4* 3.3*  CL 96* 111  CO2 22 22  GLUCOSE 146* 123*  BUN 45* 38*  CREATININE 2.49* 2.06*  CALCIUM 8.6* 7.8*   Liver Function Tests: No results for input(s): AST, ALT, ALKPHOS, BILITOT, PROT, ALBUMIN in the last 168 hours. No results for input(s): LIPASE, AMYLASE in the last 168 hours. No results for input(s): AMMONIA in the last 168 hours. CBC:  Recent Labs Lab 03/06/16 1943 03/07/16 0522 03/08/16 0140  WBC 21.5* 15.5* 13.3*  NEUTROABS 18.9*  --   --   HGB 15.6 13.4 13.0  HCT 44.8 39.6 38.9*  MCV 85.8 87.8 88.4  PLT 135* 135* 160   Cardiac Enzymes:  Recent Labs Lab 03/06/16 1943 03/07/16 0522 03/07/16 0905 03/07/16 1321  TROPONINI 0.03* 0.04* 0.03* <0.03   BNP: Invalid input(s): POCBNP CBG: No results for input(s): GLUCAP in the last 168 hours. D-Dimer No results for input(s): DDIMER in the last 72 hours. Hgb A1c  Recent Labs  03/07/16 0522  HGBA1C 6.2*   Lipid Profile No results for input(s): CHOL, HDL, LDLCALC, TRIG, CHOLHDL, LDLDIRECT in the last 72 hours. Thyroid function studies  Recent Labs  03/07/16 0522  TSH 1.671   Anemia work up No results for input(s): VITAMINB12, FOLATE, FERRITIN, TIBC, IRON, RETICCTPCT in the last 72 hours. Urinalysis    Component Value Date/Time   COLORURINE YELLOW  03/06/2016 1950   APPEARANCEUR CLOUDY (A) 03/06/2016 1950   LABSPEC 1.015 03/06/2016 1950   PHURINE 5.5 03/06/2016 1950   GLUCOSEU NEGATIVE 03/06/2016 1950   HGBUR MODERATE (A) 03/06/2016 [place] NEGATIVE 03/06/2016 [place] NEGATIVE 03/06/2016 1950   PROTEINUR 30 (A) 03/06/2016 1950   NITRITE NEGATIVE 03/06/2016 1950   LEUKOCYTESUR TRACE (A) 03/06/2016 1950   Sepsis Labs Invalid input(s): PROCALCITONIN,  WBC,  LACTICIDVEN   Time coordinating discharge: Over 30 minutes  SIGNED:   Janece Canterbury, MD  [facility] 03/08/2016, 4:44 PM Pager   If 7PM-7AM, please contact night-coverage [email or web address] Password [email or web address]

## 2016-03-08 NOTE — Progress Notes (Signed)
Pharmacy Antibiotic Note  [name] is a 75 y.o. male admitted on 03/06/2016 with suspected sepsis due to UTI.   Patient received Ceftriaxone 2gm IV x 1 dose in the ED.  Pharmacy has been consulted for Ceftriaxone dosing.  Plan:  Continue Rocephin 2g IV q24 as ordered while admitted (Wt > 100 kg)  Discharging on Cipro x 2 more days; recommended full-dose  Pharmacy will sign off as no further dose adjustments required  Height: 5\' 10"  (177.8 cm) Weight: 231 lb 7.7 oz (105 kg) IBW/kg (Calculated) : 73  Temp (24hrs), Avg:98.3 F (36.8 C), Min:97.9 F (36.6 C), Max:98.6 F (37 C)   Recent Labs Lab 03/06/16 1943 03/06/16 2335 03/07/16 0522 03/07/16 0905 03/08/16 0140  WBC 21.5*  --  15.5*  --  13.3*  CREATININE 2.49*  --   --   --  2.06*  LATICACIDVEN  --  1.92* 1.2 1.0  --     Estimated Creatinine Clearance: 37.6 mL/min (by C-G formula based on SCr of 2.06 mg/dL (H)).    Allergies  Allergen Reactions  . Asa [Aspirin]     Severe skin bruising   . Immune Globulins Other (See Comments)    Tetanus Immunes Globulins    (  Pt. Cannot take the Flu shot ) Guillian Barre     Antimicrobials this admission: 10/21 Ceftriaxone >>    Dose adjustments this admission:    Microbiology results: 10/21 BCx: sent 10/21 UCx: NGF  Thank you for allowing pharmacy to be a part of this patient's care.  Reuel Boom, PharmD, BCPS Pager: [phone] 03/08/2016, 11:55 AM

## 2016-03-08 NOTE — Discharge Instructions (Signed)
Information on my medicine - Coumadin   (Warfarin)  This medication education was reviewed with me or my healthcare representative as part of my discharge preparation.  The pharmacist that spoke with me during my hospital stay was:  Jony Ladnier A, RPH  Why was Coumadin prescribed for you? Coumadin was prescribed for you because you have a blood clot or a medical condition that can cause an increased risk of forming blood clots. Blood clots can cause serious health problems by blocking the flow of blood to the heart, lung, or brain. Coumadin can prevent harmful blood clots from forming. As a reminder your indication for Coumadin is:   Stroke Prevention Because Of Atrial Fibrillation  What test will check on my response to Coumadin? While on Coumadin (warfarin) you will need to have an INR test regularly to ensure that your dose is keeping you in the desired range. The INR (international normalized ratio) number is calculated from the result of the laboratory test called prothrombin time (PT).  If an INR APPOINTMENT HAS NOT ALREADY BEEN MADE FOR YOU please schedule an appointment to have this lab work done by your health care provider within 7 days. Your INR goal is usually a number between:  2 to 3 or your provider may give you a more narrow range like 2-2.5.  Ask your health care provider during an office visit what your goal INR is.  What  do you need to  know  About  COUMADIN? Take Coumadin (warfarin) exactly as prescribed by your healthcare provider about the same time each day.  DO NOT stop taking without talking to the doctor who prescribed the medication.  Stopping without other blood clot prevention medication to take the place of Coumadin may increase your risk of developing a new clot or stroke.  Get refills before you run out.  What do you do if you miss a dose? If you miss a dose, take it as soon as you remember on the same day then continue your regularly scheduled regimen the next  day.  Do not take two doses of Coumadin at the same time.  Important Safety Information A possible side effect of Coumadin (Warfarin) is an increased risk of bleeding. You should call your healthcare provider right away if you experience any of the following: ? Bleeding from an injury or your nose that does not stop. ? Unusual colored urine (red or dark brown) or unusual colored stools (red or black). ? Unusual bruising for unknown reasons. ? A serious fall or if you hit your head (even if there is no bleeding).  Some foods or medicines interact with Coumadin (warfarin) and might alter your response to warfarin. To help avoid this: ? Eat a balanced diet, maintaining a consistent amount of Vitamin K. ? Notify your provider about major diet changes you plan to make. ? Avoid alcohol or limit your intake to 1 drink for women and 2 drinks for men per day. (1 drink is 5 oz. wine, 12 oz. beer, or 1.5 oz. liquor.)  Make sure that ANY health care provider who prescribes medication for you knows that you are taking Coumadin (warfarin).  Also make sure the healthcare provider who is monitoring your Coumadin knows when you have started a new medication including herbals and non-prescription products.  Coumadin (Warfarin)  Major Drug Interactions  Increased Warfarin Effect Decreased Warfarin Effect  Alcohol (large quantities) Antibiotics (esp. Septra/Bactrim, Flagyl, Cipro) Amiodarone (Cordarone) Aspirin (ASA) Cimetidine (Tagamet) Megestrol (Megace) NSAIDs (ibuprofen,  naproxen, etc.) °Piroxicam (Feldene) °Propafenone (Rythmol SR) °Propranolol (Inderal) °Isoniazid (INH) °Posaconazole (Noxafil) Barbiturates (Phenobarbital) °Carbamazepine (Tegretol) °Chlordiazepoxide (Librium) °Cholestyramine (Questran) °Griseofulvin °Oral Contraceptives °Rifampin °Sucralfate (Carafate) °Vitamin K  ° °Coumadin® (Warfarin) Major Herbal Interactions  °Increased Warfarin Effect Decreased Warfarin Effect   °Garlic °Ginseng °Ginkgo biloba Coenzyme Q10 °Green tea °St. John’s wort   ° °Coumadin® (Warfarin) FOOD Interactions  °Eat a consistent number of servings per week of foods HIGH in Vitamin K °(1 serving = ½ cup)  °Collards (cooked, or boiled & drained) °Kale (cooked, or boiled & drained) °Mustard greens (cooked, or boiled & drained) °Parsley *serving size only = ¼ cup °Spinach (cooked, or boiled & drained) °Swiss chard (cooked, or boiled & drained) °Turnip greens (cooked, or boiled & drained)  °Eat a consistent number of servings per week of foods MEDIUM-HIGH in Vitamin K °(1 serving = 1 cup)  °Asparagus (cooked, or boiled & drained) °Broccoli (cooked, boiled & drained, or raw & chopped) °Brussel sprouts (cooked, or boiled & drained) *serving size only = ½ cup °Lettuce, raw (green leaf, endive, romaine) °Spinach, raw °Turnip greens, raw & chopped  ° °These websites have more information on Coumadin (warfarin):  www.coumadin.com; °www.ahrq.gov/consumer/coumadin.htm; ° ° ° °

## 2016-03-08 NOTE — Progress Notes (Signed)
[place] for Heparin >> Lovenox/Warfarin Indication: atrial fibrillation  Allergies  Allergen Reactions  . Asa [Aspirin]     Severe skin bruising   . Immune Globulins Other (See Comments)    Tetanus Immunes Globulins    (  Pt. Cannot take the Flu shot ) [name]     Patient Measurements: Height: 5\' 10"  (177.8 cm) Weight: 231 lb 7.7 oz (105 kg) IBW/kg (Calculated) : 73  Vital Signs: Temp: 97.9 F (36.6 C) (10/23 0528) Temp Source: Oral (10/23 0528) BP: 107/61 (10/23 0528) Pulse Rate: 92 (10/23 0528)  Labs:  Recent Labs  03/06/16 1943 03/07/16 0522 03/07/16 0905 03/07/16 1321 03/08/16 0140 03/08/16 0942  HGB 15.6 13.4  --   --  13.0  --   HCT 44.8 39.6  --   --  38.9*  --   PLT 135* 135*  --   --  160  --   APTT  --  34  --   --   --   --   LABPROT  --  14.8  --   --   --   --   INR  --  1.15  --   --   --   --   HEPARINUNFRC  --   --   --  0.10* 0.43 0.30  CREATININE 2.49*  --   --   --  2.06*  --   TROPONINI 0.03* 0.04* 0.03* <0.03  --   --     Estimated Creatinine Clearance: 37.6 mL/min (by C-G formula based on SCr of 2.06 mg/dL (H)).  Medications:  Scheduled:  . cefTRIAXone (ROCEPHIN)  IV  2 g Intravenous Q24H  . coumadin book   Does not apply Once  . diltiazem  30 mg Oral Q6H  . enoxaparin (LOVENOX) injection  100 mg Subcutaneous BID  . enoxaparin   Does not apply Once  . metoprolol succinate  25 mg Oral Daily  . rosuvastatin  40 mg Oral Daily  . tamsulosin  0.4 mg Oral QPC supper  . warfarin  5 mg Oral Once  . warfarin   Does not apply Once  . Warfarin - Pharmacist Dosing Inpatient   Does not apply q1800   Infusions:      Assessment: 75 yr male with new onset AFib with RVR.  PMH significant for CVA, left CEA, craniotomy for meningioma, HTN and CKD.  Pharmacy consulted to dose IV heparin. Spoke with Dr Eulas Post, who requested no heparin bolus throughout duration of heparin therapy due to potential concern  for bleed. Patient on no oral anticoagulation PTA, but does recall being on warfarin in the past managed by Dr Laurann Montana of [facility].  Significant events:  10/23: chg to Warfarin bridged with Lovenox in preparation for discharge.  CHADS2VASc = 6 and currently still in AFib.  MD stopping Plavix.  Today, 03/08/16  Current HL is borderline low but therapeutic overnight  Hgb stable, Plt improved to wnl  No reported bleeding issues  Drug interactions: Cipro for discharge  Eating 75-100% of meals  Goal of Therapy:  INR 2-3 Anti-Xa level 0.6-1 units/ml 4hrs after LMWH dose given   Plan:   Stop heparin drip 1 hr before giving Lovenox  Lovenox 100 mg SQ bid; RN to provide teaching  Warfarin 5 mg PO x 1 (will go home on Cipro but only x 2 days and wt > 100 kg)  Daily INR; CBC q72 hr while on warfarin/lovenox  SCr weekly while on Lovenox  Recommend bridging at least 5 days and until INR therapeutic x 24 hr  Pharmacy to provide warfarin teaching prior to discharge  Monitor for signs of bleeding or thrombosis  Reuel Boom, PharmD, BCPS Pager: [phone] 03/08/2016, 11:40 AM

## 2016-03-12 ENCOUNTER — Inpatient Hospital Stay (HOSPITAL_COMMUNITY)
Admission: AD | Admit: 2016-03-12 | Discharge: 2016-03-16 | DRG: 603 | Disposition: A | Discharge status: Home Health | Payer: PPO | Source: Ambulatory Visit | Attending: Internal Medicine | Admitting: Internal Medicine

## 2016-03-12 ENCOUNTER — Encounter (HOSPITAL_COMMUNITY): Payer: Self-pay | Admitting: General Practice

## 2016-03-12 DIAGNOSIS — I129 Hypertensive chronic kidney disease with stage 1 through stage 4 chronic kidney disease, or unspecified chronic kidney disease: Secondary | ICD-10-CM | POA: Diagnosis present

## 2016-03-12 DIAGNOSIS — M25562 Pain in left knee: Secondary | ICD-10-CM | POA: Diagnosis not present

## 2016-03-12 DIAGNOSIS — R339 Retention of urine, unspecified: Secondary | ICD-10-CM | POA: Diagnosis not present

## 2016-03-12 DIAGNOSIS — N289 Disorder of kidney and ureter, unspecified: Secondary | ICD-10-CM | POA: Diagnosis not present

## 2016-03-12 DIAGNOSIS — Z86010 Personal history of colonic polyps: Secondary | ICD-10-CM | POA: Diagnosis not present

## 2016-03-12 DIAGNOSIS — I4821 Permanent atrial fibrillation: Secondary | ICD-10-CM | POA: Diagnosis present

## 2016-03-12 DIAGNOSIS — Z8673 Personal history of transient ischemic attack (TIA), and cerebral infarction without residual deficits: Secondary | ICD-10-CM | POA: Diagnosis not present

## 2016-03-12 DIAGNOSIS — E785 Hyperlipidemia, unspecified: Secondary | ICD-10-CM | POA: Diagnosis present

## 2016-03-12 DIAGNOSIS — Z9049 Acquired absence of other specified parts of digestive tract: Secondary | ICD-10-CM | POA: Diagnosis not present

## 2016-03-12 DIAGNOSIS — I4891 Unspecified atrial fibrillation: Secondary | ICD-10-CM | POA: Diagnosis present

## 2016-03-12 DIAGNOSIS — N183 Chronic kidney disease, stage 3 (moderate): Secondary | ICD-10-CM | POA: Diagnosis present

## 2016-03-12 DIAGNOSIS — Z86011 Personal history of benign neoplasm of the brain: Secondary | ICD-10-CM

## 2016-03-12 DIAGNOSIS — N400 Enlarged prostate without lower urinary tract symptoms: Secondary | ICD-10-CM | POA: Diagnosis not present

## 2016-03-12 DIAGNOSIS — E876 Hypokalemia: Secondary | ICD-10-CM | POA: Diagnosis present

## 2016-03-12 DIAGNOSIS — R413 Other amnesia: Secondary | ICD-10-CM | POA: Diagnosis not present

## 2016-03-12 DIAGNOSIS — G610 Guillain-Barre syndrome: Secondary | ICD-10-CM | POA: Diagnosis present

## 2016-03-12 DIAGNOSIS — R21 Rash and other nonspecific skin eruption: Secondary | ICD-10-CM | POA: Diagnosis not present

## 2016-03-12 DIAGNOSIS — Z79899 Other long term (current) drug therapy: Secondary | ICD-10-CM

## 2016-03-12 DIAGNOSIS — L03116 Cellulitis of left lower limb: Principal | ICD-10-CM | POA: Diagnosis present

## 2016-03-12 DIAGNOSIS — I482 Chronic atrial fibrillation: Secondary | ICD-10-CM

## 2016-03-12 DIAGNOSIS — R338 Other retention of urine: Secondary | ICD-10-CM | POA: Diagnosis present

## 2016-03-12 DIAGNOSIS — N139 Obstructive and reflux uropathy, unspecified: Secondary | ICD-10-CM | POA: Diagnosis present

## 2016-03-12 DIAGNOSIS — Z886 Allergy status to analgesic agent status: Secondary | ICD-10-CM | POA: Diagnosis not present

## 2016-03-12 DIAGNOSIS — I480 Paroxysmal atrial fibrillation: Secondary | ICD-10-CM | POA: Diagnosis not present

## 2016-03-12 DIAGNOSIS — N390 Urinary tract infection, site not specified: Secondary | ICD-10-CM | POA: Diagnosis not present

## 2016-03-12 DIAGNOSIS — R262 Difficulty in walking, not elsewhere classified: Secondary | ICD-10-CM

## 2016-03-12 DIAGNOSIS — A419 Sepsis, unspecified organism: Secondary | ICD-10-CM | POA: Diagnosis not present

## 2016-03-12 DIAGNOSIS — Z7901 Long term (current) use of anticoagulants: Secondary | ICD-10-CM

## 2016-03-12 HISTORY — DX: Unspecified atrial fibrillation: I48.91

## 2016-03-12 LAB — PROTIME-INR
INR: 1.45
Prothrombin Time: 17.7 seconds — ABNORMAL HIGH (ref 11.4–15.2)

## 2016-03-12 LAB — CULTURE, BLOOD (ROUTINE X 2)
Culture: NO GROWTH
Culture: NO GROWTH

## 2016-03-12 MED ORDER — ROSUVASTATIN CALCIUM 20 MG PO TABS
40.0000 mg | ORAL_TABLET | Freq: Every day | ORAL | Status: DC
  Administered 2016-03-13 – 2016-03-16 (×4): 40 mg via ORAL
  Filled 2016-03-12 (×4): qty 2

## 2016-03-12 MED ORDER — VANCOMYCIN HCL 10 G IV SOLR
1250.0000 mg | INTRAVENOUS | Status: DC
  Administered 2016-03-13 – 2016-03-16 (×4): 1250 mg via INTRAVENOUS
  Filled 2016-03-12 (×4): qty 1250

## 2016-03-12 MED ORDER — MORPHINE SULFATE (PF) 2 MG/ML IV SOLN: 1.0000 mg | INTRAVENOUS | Status: DC | PRN

## 2016-03-12 MED ORDER — PIPERACILLIN-TAZOBACTAM 3.375 G IVPB 30 MIN
3.3750 g | Freq: Once | INTRAVENOUS | Status: DC
  Filled 2016-03-12: qty 50

## 2016-03-12 MED ORDER — VITAMIN D 1000 UNITS PO TABS
1000.0000 [IU] | ORAL_TABLET | Freq: Every day | ORAL | Status: DC
  Administered 2016-03-13 – 2016-03-16 (×4): 1000 [IU] via ORAL
  Filled 2016-03-12 (×4): qty 1

## 2016-03-12 MED ORDER — VANCOMYCIN HCL 10 G IV SOLR
1500.0000 mg | Freq: Once | INTRAVENOUS | Status: AC
  Administered 2016-03-12: 1500 mg via INTRAVENOUS
  Filled 2016-03-12: qty 1500

## 2016-03-12 MED ORDER — DILTIAZEM HCL ER COATED BEADS 120 MG PO CP24
120.0000 mg | ORAL_CAPSULE | Freq: Every day | ORAL | Status: DC
  Administered 2016-03-13 – 2016-03-16 (×4): 120 mg via ORAL
  Filled 2016-03-12 (×4): qty 1

## 2016-03-12 MED ORDER — ONDANSETRON HCL 4 MG/2ML IJ SOLN: 4.0000 mg | Freq: Four times a day (QID) | INTRAMUSCULAR | Status: DC | PRN

## 2016-03-12 MED ORDER — SODIUM CHLORIDE 0.9 % IV SOLN: 250.0000 mL | INTRAVENOUS | Status: DC | PRN

## 2016-03-12 MED ORDER — SODIUM CHLORIDE 0.9 % IV SOLN
INTRAVENOUS | Status: DC
  Administered 2016-03-12 – 2016-03-15 (×7): via INTRAVENOUS

## 2016-03-12 MED ORDER — VITAMIN B-12 1000 MCG PO TABS
1000.0000 ug | ORAL_TABLET | ORAL | Status: DC
  Administered 2016-03-15: 1000 ug via ORAL
  Filled 2016-03-12: qty 1

## 2016-03-12 MED ORDER — SODIUM CHLORIDE 0.9% FLUSH
3.0000 mL | INTRAVENOUS | Status: DC | PRN
  Administered 2016-03-15: 3 mL via INTRAVENOUS
  Filled 2016-03-12: qty 3

## 2016-03-12 MED ORDER — METOPROLOL TARTRATE 50 MG PO TABS
50.0000 mg | ORAL_TABLET | Freq: Two times a day (BID) | ORAL | Status: DC
  Administered 2016-03-13 – 2016-03-16 (×6): 50 mg via ORAL
  Filled 2016-03-12 (×8): qty 1

## 2016-03-12 MED ORDER — SODIUM CHLORIDE 0.9% FLUSH
3.0000 mL | Freq: Two times a day (BID) | INTRAVENOUS | Status: DC
  Administered 2016-03-12 – 2016-03-15 (×2): 3 mL via INTRAVENOUS

## 2016-03-12 MED ORDER — WARFARIN - PHYSICIAN DOSING INPATIENT: Freq: Every day | Status: DC

## 2016-03-12 MED ORDER — TAMSULOSIN HCL 0.4 MG PO CAPS
0.4000 mg | ORAL_CAPSULE | Freq: Every day | ORAL | Status: DC
  Administered 2016-03-12 – 2016-03-16 (×5): 0.4 mg via ORAL
  Filled 2016-03-12 (×5): qty 1

## 2016-03-12 MED ORDER — WARFARIN SODIUM 5 MG PO TABS
5.0000 mg | ORAL_TABLET | Freq: Once | ORAL | Status: AC
  Administered 2016-03-12: 5 mg via ORAL
  Filled 2016-03-12: qty 1

## 2016-03-12 MED ORDER — ENOXAPARIN SODIUM 150 MG/ML ~~LOC~~ SOLN
150.0000 mg | SUBCUTANEOUS | Status: DC
Start: 2016-03-12 — End: 2016-03-14
  Administered 2016-03-12 – 2016-03-13 (×2): 150 mg via SUBCUTANEOUS
  Filled 2016-03-12 (×3): qty 1

## 2016-03-12 MED ORDER — PIPERACILLIN-TAZOBACTAM 3.375 G IVPB
3.3750 g | Freq: Three times a day (TID) | INTRAVENOUS | Status: DC
  Administered 2016-03-12 – 2016-03-16 (×11): 3.375 g via INTRAVENOUS
  Filled 2016-03-12 (×13): qty 50

## 2016-03-12 MED ORDER — SODIUM CHLORIDE 0.9% FLUSH
3.0000 mL | Freq: Two times a day (BID) | INTRAVENOUS | Status: DC
  Administered 2016-03-14 – 2016-03-15 (×2): 3 mL via INTRAVENOUS

## 2016-03-12 MED ORDER — VANCOMYCIN HCL IN DEXTROSE 1-5 GM/200ML-% IV SOLN
1000.0000 mg | Freq: Once | INTRAVENOUS | Status: DC
  Filled 2016-03-12: qty 200

## 2016-03-12 MED ORDER — ONDANSETRON HCL 4 MG PO TABS: 4.0000 mg | ORAL_TABLET | Freq: Four times a day (QID) | ORAL | Status: DC | PRN

## 2016-03-12 NOTE — Progress Notes (Addendum)
Pharmacy Antibiotic Note  [name] is a 75 y.o. male admitted on 03/12/2016 with left knee cellulitis.  Pharmacy has been consulted for vancomycin and Zosyn dosing. Of note, he had recent admission for new onset Afib and UTI. He also was seen by Ortho today and had arthrocentesis with fluid reported to be clear. CKD 3 noted, last SCr 2.06, norm CrCl ~31 ml/min.  Plan:  Zosyn 3.375 g IV q8h to be infused over 4 hours Vancomycin 1500 mg IV once then 1250 mg IV q24h Monitor renal function and clinical progress Vancomycin trough as clinically indicated Follow-up labs in the morning     Temp (24hrs), Avg:98.6 F (37 C), Min:98.6 F (37 C), Max:98.6 F (37 C)   Recent Labs Lab 03/06/16 1943 03/06/16 2335 03/07/16 0522 03/07/16 0905 03/08/16 0140  WBC 21.5*  --  15.5*  --  13.3*  CREATININE 2.49*  --   --   --  2.06*  LATICACIDVEN  --  1.92* 1.2 1.0  --     Estimated Creatinine Clearance: 37.6 mL/min (by C-G formula based on SCr of 2.06 mg/dL (H)).    Allergies  Allergen Reactions  . Asa [Aspirin]     Severe skin bruising   . Immune Globulins Other (See Comments)    Tetanus Immunes Globulins    (  Pt. Cannot take the Flu shot ) Guillian Barre     Antimicrobials this admission: Vancomycin 10/27 >>  Zosyn 10/27 >>   Dose adjustments this admission:   Microbiology results: 10/21 BCx: NEG 10/21 UCx: NEG    Thank you for allowing pharmacy to be a part of this patient's care.  Renold Genta, PharmD, BCPS Clinical Pharmacist Phone for tonight - [place] - [phone] 03/12/2016 5:32 PM

## 2016-03-12 NOTE — Progress Notes (Signed)
[facility] Admissions paged to make aware and let attending know patient arrived to unit as direct admission. Betha Loa Elizabeth Haff, RN

## 2016-03-12 NOTE — Consult Note (Signed)
[facility], [place], [place], [place] [address] Telephone: [phone]  Fax: [phone]  Kathalene Frames. Mayer Camel, M.D. Monico Blitz Rhona Raider, M.D. Alta Corning, M.D. Malena Catholic, M.D. Phylliss Bob, M.D. Rayvon Char Grandville Silos, M.D. Gentry Fitz, M.D. Normajean Glasgow, M.D. Vernell Leep, M.D. (Retired)   Patient Name: [name], [name]    Date of Birth: Oct 21, 1940    Shared ID #:  [id number] Date: 03/12/2016    AGE: 75y    Sex: M    Employer:     DOI:      HPI: Patient presents with a chief complaint of pain and swelling just above the left knee with erythema and tenderness that began a few days ago about the time he was being discharged from the hospital for what his wife described as a kidney infection or UTI.  He was admitted to [facility] from 21, October to 23, October discharged with a urinary catheter on ciprofloxacin.  Reportedly the cultures were negative.  His fever and white blood cell count had improved from the time of admission.  At this time.  He is also getting Lovenox injections, and is being bridged to warfarin for afib.  The pain in his distal left thigh is bad enough for he has trouble bearing weight and he is seen today in a wheelchair.  All: Tetanus immunoglobulin  ROS: 14 point review of systems form filled out by the patient was reviewed and was negative as it relates to the history of present illness except for:, Hypertension, hyperlipidemia, stage III kidney disease (TIA in 2003 with an occluded right carotid artery left endarterectomy in 2003.  And brace syndrome in 1999.  Impaired fasting glucose, diverticulosis.  PMH: Prior surgeries inguinal hernia.  In the 1980s, meningioma resection in 1998, colectomy and cholecystectomy in 2001.  Left carotid endarterectomy in 2003, and colonoscopy with polyp resection in the past.  Medications include vitamin D, Crestor, Cipro 500 mg twice a  day, metoprolol, Lovenox, Flomax, warfarin, diltiazem.  FHx: Cardiovascular disease and colon cancer  SocHx:.  The patient does not use alcohol.  Smoking in 1998.  Is retired and lives with his wife.  PE: Well-nourished well-developed patient seated on the exam table in no apparent distress, no shortness of breath.  The patient has erythema and redness starting just above the patella and going proximally for about 8-10 cm.  There is no palpable effusion in the knee.  There is pain as you taken through a range of motion.  He is afebrile.  Collateral ligaments are stable.  He can fire his quad and hamstrings.  His left calf is 2+ swollen but nontender to palpation.  He can move his foot up and down without difficulty.  RADIOGRAPHS:  X-rays were ordered, performed, and interpreted by me today included; AP, lateral and sunrise x-rays of the left knee show mild arthritic changes. Normal cartilage layer. No fractures.  No osteolysis.  After discussing options and obtaining informed consent, I sterilely prepped the medial parapatellar region of the left knee with an alcohol pad, anesthetized with ethyl chloride,  and aspirated 6 cc of normal-appearing joint fluid.  Fluid was sent off for cell count, Gram stain and culture. You could easily read the numbers through backside of the the syringe.  No evidence of infection.  Assess: Probable cellulitis of the left thigh.  No evidence of pyarthrosis.  Plan: I contacted Dr Carvel Getting with [facility] at [facility] for consideration of admission for IV antibiotics.  I would also recommend he obtain an MRI scan to make sure there is no abscess in his thigh, although that is unlikely.  For now he will continue the ciprofloxacin.  Electronically [place] Mayer Camel, M.D.

## 2016-03-12 NOTE — H&P (Signed)
History and Physical    [name] LKG:[id number] DOB: Oct 02, 1940 DOA: 03/12/2016  PCP: Irven Shelling, MD   Patient coming from: Home  Chief Complaint: Left knee pain  HPI: [name] is a 75 y.o. male with medical history significant of recent hospitalization October 21 to 23rd, for new-onset atrial fibrillation in the setting of sepsis, due to urinary tract infection. The day he was discharged he noticed left knee pain, the pain has been sharp in nature, worsening in intensity up to a 10 out of 10, no radiation, associated with local erythema, tenderness, decreased ambulation. Denies any associated fevers or chills. Patient has not been on diuretics, no history of gout. Patient was seen today by his regular physician, noted left knee erythema, sent to orthopedics for suspected infected joint, left knee was intervened with arthrocentesis, the fluid was reported to be clear, he was sent to the hospital for further evaluation treatment for cellulitis.   ED Course: Direct admission from orthopedics.  Review of Systems:  1. General. No fever, no chills, no weight gain or weight loss 2. Cardiovascular no chest pain, no angina, no PND. 3. Pulmonary no shortness of breath cough or hemoptysis 4. Gastrointestinal no nausea vomiting or diarrhea 5. ENT no rhinitis or sore throat 6. Skin positive for rash as mentioned in history present illness 7. Endocrine a tremors heat or cold intolerance 8. Urology no dysuria or increased urinary frequency, positive urinary retention, Foley catheter in place.  9. Hematology denies a bruisability or frequent infections 10. Psych not depression or anxiety  Past Medical History:  Diagnosis Date  . Brain tumor (benign) [facility]) March 2015   2 small tumors  . Carotid artery occlusion   . Cerebrovascular disease September 09, 2001   TIA, Left brain  . CKD (chronic kidney disease), stage III   . Diverticulosis   . ED (erectile dysfunction)   .  Guillain-Barre syndrome ([place]) 1999  . Hx of elevated lipids   . Hyperlipidemia   . Hypertension   . Memory loss   . Stroke [facility]) 2003    Past Surgical History:  Procedure Laterality Date  . BRAIN SURGERY  1998   Benign brain tumor removed, Left hemisphere- ? Meningioma  . CAROTID ENDARTERECTOMY Left September 13, 2001   LEFT cea  . CHOLECYSTECTOMY  Nov. 2001  . COLON SURGERY  Feb. 2003   Colonic polyps removed endoscopically  . COLONOSCOPY WITH PROPOFOL N/A 11/11/2015   Procedure: COLONOSCOPY WITH PROPOFOL;  Surgeon: Garlan Fair, MD;  Location: [facility] END[facility]COPY;  Service: Endoscopy;  Laterality: N/A;  . INGUINAL HERNIA REPAIR  1970's     reports that he quit smoking about 19 years ago. His smoking use included Cigarettes. He has never used smokeless tobacco. He reports that he does not drink alcohol or use drugs.  Allergies  Allergen Reactions  . Asa [Aspirin]     Severe skin bruising   . Immune Globulins Other (See Comments)    Tetanus Immunes Globulins    (  Pt. Cannot take the Flu shot ) Guillian Barre     Family History  Problem Relation Age of Onset  . Colon cancer Mother     Metastatic   . Cancer Mother     Colon  . Stroke Father   . Diabetes Father      Prior to Admission medications   Medication Sig Start Date End Date Taking? Authorizing Provider  cholecalciferol (VITAMIN D) 1000 units tablet Take 1,000 Units  by mouth daily.    Historical Provider, MD  ciprofloxacin (CIPRO) 500 MG tablet Take 1 tablet (500 mg total) by mouth 2 (two) times daily. 03/08/16   Janece Canterbury, MD  CRESTOR 40 MG tablet Take 40 mg by mouth daily.  06/19/12   Historical Provider, MD  diltiazem (CARDIZEM CD) 120 MG 24 hr capsule Take 1 capsule (120 mg total) by mouth daily. 03/08/16   Janece Canterbury, MD  enoxaparin (LOVENOX) 150 MG/ML injection Inject 1 mL (150 mg total) into the skin daily. 03/08/16 03/15/16  Janece Canterbury, MD  metoprolol (LOPRESSOR) 50 MG tablet Take 1 tablet (50 mg  total) by mouth 2 (two) times daily. 03/08/16   Janece Canterbury, MD  tamsulosin (FLOMAX) 0.4 MG CAPS capsule Take 1 capsule (0.4 mg total) by mouth daily after supper. 03/08/16   Janece Canterbury, MD  vitamin B-12 (CYANOCOBALAMIN) 1000 MCG tablet Take 1,000 mcg by mouth 2 (two) times a week.     Historical Provider, MD  warfarin (COUMADIN) 5 MG tablet Take 1 tablet (5 mg total) by mouth daily at 6 PM. 03/08/16 04/07/16  Janece Canterbury, MD    Physical Exam: Vitals:   03/12/16 1647  BP: (!) 140/55  Pulse: 62  Resp: 18  Temp: 98.6 F (37 C)  TempSrc: Oral  SpO2: 100%      Constitutional: deconditioned and ill looking appearing.  Vitals:   03/12/16 1647  BP: (!) 140/55  Pulse: 62  Resp: 18  Temp: 98.6 F (37 C)  TempSrc: Oral  SpO2: 100%   Eyes: PERRL, lids and conjunctivae normal ENMT: Mucous membranes are dry. Posterior pharynx clear of any exudate or lesions.Normal dentition.  Neck: normal, supple, no masses, no thyromegaly Respiratory: clear to auscultation bilaterally, no wheezing, no crackles. Normal respiratory effort. No accessory muscle use.  Cardiovascular: Irregular , no murmurs / rubs / gallops. Lower extremity trace edema. 2+ pedal pulses. No carotid bruits.  Abdomen: no tenderness, no masses palpated. No hepatosplenomegaly. Bowel sounds positive.  Musculoskeletal: no clubbing / cyanosis. No joint deformity upper and lower extremities. Good ROM, no contractures. Normal muscle tone. Decrease of range of motion left knee due to pain and edema Skin: left knee with round erythematous lesion, clear defined margins, tender to palpation, increase local temperature, local moderate edema.  Neurologic: CN 2-12 grossly intact. Sensation intact, DTR normal. Strength 5/5 in all 4.  Psychiatric: Normal judgment and insight. Alert and oriented x 3. Normal mood.     Labs on Admission: I have personally reviewed following labs and imaging studies  CBC:  Recent Labs Lab  03/06/16 1943 03/07/16 0522 03/08/16 0140  WBC 21.5* 15.5* 13.3*  NEUTROABS 18.9*  --   --   HGB 15.6 13.4 13.0  HCT 44.8 39.6 38.9*  MCV 85.8 87.8 88.4  PLT 135* 135* 676   Basic Metabolic Panel:  Recent Labs Lab 03/06/16 1943 03/08/16 0140  NA 132* 141  K 3.4* 3.3*  CL 96* 111  CO2 22 22  GLUCOSE 146* 123*  BUN 45* 38*  CREATININE 2.49* 2.06*  CALCIUM 8.6* 7.8*   GFR: Estimated Creatinine Clearance: 37.6 mL/min (by C-G formula based on SCr of 2.06 mg/dL (H)). Liver Function Tests: No results for input(s): AST, ALT, ALKPHOS, BILITOT, PROT, ALBUMIN in the last 168 hours. No results for input(s): LIPASE, AMYLASE in the last 168 hours. No results for input(s): AMMONIA in the last 168 hours. Coagulation Profile:  Recent Labs Lab 03/07/16 0522  INR 1.15  Cardiac Enzymes:  Recent Labs Lab 03/06/16 1943 03/07/16 0522 03/07/16 0905 03/07/16 1321  TROPONINI 0.03* 0.04* 0.03* <0.03   BNP (last 3 results) No results for input(s): PROBNP in the last 8760 hours. HbA1C: No results for input(s): HGBA1C in the last 72 hours. CBG: No results for input(s): GLUCAP in the last 168 hours. Lipid Profile: No results for input(s): CHOL, HDL, LDLCALC, TRIG, CHOLHDL, LDLDIRECT in the last 72 hours. Thyroid Function Tests: No results for input(s): TSH, T4TOTAL, FREET4, T3FREE, THYROIDAB in the last 72 hours. Anemia Panel: No results for input(s): VITAMINB12, FOLATE, FERRITIN, TIBC, IRON, RETICCTPCT in the last 72 hours. Urine analysis:    Component Value Date/Time   COLORURINE YELLOW 03/06/2016 1950   APPEARANCEUR CLOUDY (A) 03/06/2016 1950   LABSPEC 1.015 03/06/2016 1950   PHURINE 5.5 03/06/2016 1950   GLUCOSEU NEGATIVE 03/06/2016 1950   HGBUR MODERATE (A) 03/06/2016 [place] NEGATIVE 03/06/2016 1950   [place] NEGATIVE 03/06/2016 1950   PROTEINUR 30 (A) 03/06/2016 1950   NITRITE NEGATIVE 03/06/2016 1950   LEUKOCYTESUR TRACE (A) 03/06/2016 1950   Sepsis  Labs: !!!!!!!!!!!!!!!!!!!!!!!!!!!!!!!!!!!!!!!!!!!! @LABRCNTIP (procalcitonin:4,lacticidven:4) ) Recent Results (from the past 240 hour(s))  Urine culture     Status: None   Collection Time: 03/06/16  7:50 PM  Result Value Ref Range Status   Specimen Description URINE, CLEAN CATCH  Final   Special Requests NONE  Final   Culture NO GROWTH Performed at [facility]   Final   Report Status 03/08/2016 FINAL  Final  Blood Culture (routine x 2)     Status: None   Collection Time: 03/06/16  8:23 PM  Result Value Ref Range Status   Specimen Description BLOOD R WRIST  Final   Special Requests BOTTLES DRAWN AEROBIC AND ANAEROBIC 5ML EA  Final   Culture   Final    NO GROWTH 5 DAYS Performed at [facility]    Report Status 03/12/2016 FINAL  Final  Blood Culture (routine x 2)     Status: None   Collection Time: 03/06/16  9:04 PM  Result Value Ref Range Status   Specimen Description BLOOD LEFT ANTECUBITAL  Final   Special Requests BOTTLES DRAWN AEROBIC AND ANAEROBIC [place] EA  Final   Culture   Final    NO GROWTH 5 DAYS Performed at [facility]    Report Status 03/12/2016 FINAL  Final     Radiological Exams on Admission: No results found.   Assessment/Plan Active Problems:   * No active hospital problems. *  This is a 75 year old male who presents with the signs of cellulitis on his left knee, the symptoms started the day he was discharged from the hospital. The rash has been worsening, it has been associated with pain and decreased ambulation. He was evaluated as an outpatient with arthrocentesis, fluid was reportedly clear, he was referred for inpatient management and IV antibiotics. On the physical examination his blood pressure is 140/50, heart rate 62, temp 98.6, respiratory rate 18, oxygen saturation 100%.  He is euvolemic, his lungs are clear to auscultation, he does have trace edema bilaterally, his left knee has significant erythema, tender to palpation,  increased local temperature. Mild decreased range of motion due to pain.  Working diagnosis. Left knee cellulitis with risk of worsening into septic joint.  1. Left knee cellulitis. Patient will be placed on broad-spectrum antibiotics including vancomycin and Zosyn. Will follow up on cultures, cell count and temperature curve. Will request records from the  orthopedic clinic, question about synovial fluid Gram stain and culture. Gentle hydration with normal saline 75 mL per hour  2. Atrial fibrillation. Continue rate control with diltiazem and metoprolol. Anticoagulation with Lovenox and warfarin. Will check stat INR, the patient was fluroquinolones, for urine infection. Old records personally reviewed showing normal LV systolic function.   3. BPH/ urinary obstruction. Will continue Flomax 0.4, patient has been diagnosed with urinary retention, continue Foley catheter.  4. Dyslipidemia. Continue Crestor.  5. Chronic kidney disease stage III. Will check stat chemistry, will be cautious with fluids, follow vancomycin level per pharmacy protocol.   DVT prophylaxis: warfarin/ lovenox   Code Status:  Full   Family Communication: I spoke with patient's family at beside and all questions were addressed.  Disposition Plan: home   Consults called: na  Admission status:  Inpatient    Mauricio Gerome Apley MD [facility] Pager [phone]  If 7PM-7AM, please contact night-coverage [email or web address] Password [email or web address]  03/12/2016, 5:06 PM

## 2016-03-12 NOTE — Progress Notes (Signed)
Patient arrived to unit, no distress observed, vitals stable, call bell within reach and oriented to unit.  Ross Loa Markese Bloxham, RN

## 2016-03-13 ENCOUNTER — Inpatient Hospital Stay (HOSPITAL_COMMUNITY): Payer: PPO

## 2016-03-13 DIAGNOSIS — N289 Disorder of kidney and ureter, unspecified: Secondary | ICD-10-CM

## 2016-03-13 DIAGNOSIS — R338 Other retention of urine: Secondary | ICD-10-CM | POA: Diagnosis not present

## 2016-03-13 DIAGNOSIS — N183 Chronic kidney disease, stage 3 (moderate): Secondary | ICD-10-CM

## 2016-03-13 DIAGNOSIS — I10 Essential (primary) hypertension: Secondary | ICD-10-CM

## 2016-03-13 DIAGNOSIS — L03116 Cellulitis of left lower limb: Secondary | ICD-10-CM | POA: Diagnosis not present

## 2016-03-13 DIAGNOSIS — I4891 Unspecified atrial fibrillation: Secondary | ICD-10-CM

## 2016-03-13 DIAGNOSIS — E785 Hyperlipidemia, unspecified: Secondary | ICD-10-CM

## 2016-03-13 LAB — COMPREHENSIVE METABOLIC PANEL
ALBUMIN: 2 g/dL — AB (ref 3.5–5.0)
ALK PHOS: 89 U/L (ref 38–126)
ALT: 46 U/L (ref 17–63)
ANION GAP: 6 (ref 5–15)
AST: 44 U/L — ABNORMAL HIGH (ref 15–41)
BUN: 19 mg/dL (ref 6–20)
CALCIUM: 7.7 mg/dL — AB (ref 8.9–10.3)
CHLORIDE: 114 mmol/L — AB (ref 101–111)
CO2: 23 mmol/L (ref 22–32)
Creatinine, Ser: 1.6 mg/dL — ABNORMAL HIGH (ref 0.61–1.24)
GFR calc non Af Amer: 41 mL/min — ABNORMAL LOW (ref >60)
GFR, EST AFRICAN AMERICAN: 47 mL/min — AB (ref >60)
GLUCOSE: 128 mg/dL — AB (ref 65–99)
POTASSIUM: 3.4 mmol/L — AB (ref 3.5–5.1)
SODIUM: 143 mmol/L (ref 135–145)
Total Bilirubin: 0.7 mg/dL (ref 0.3–1.2)
Total Protein: 4.8 g/dL — ABNORMAL LOW (ref 6.5–8.1)

## 2016-03-13 LAB — CBC
HCT: 36.4 % — ABNORMAL LOW (ref 39.0–52.0)
Hemoglobin: 11.7 g/dL — ABNORMAL LOW (ref 13.0–17.0)
MCH: 29 pg (ref 26.0–34.0)
MCHC: 32.1 g/dL (ref 30.0–36.0)
MCV: 90.3 fL (ref 78.0–100.0)
PLATELETS: 242 10*3/uL (ref 150–400)
RBC: 4.03 MIL/uL — AB (ref 4.22–5.81)
RDW: 14.5 % (ref 11.5–15.5)
WBC: 14.4 10*3/uL — AB (ref 4.0–10.5)

## 2016-03-13 LAB — PROTIME-INR
INR: 1.67
Prothrombin Time: 19.9 seconds — ABNORMAL HIGH (ref 11.4–15.2)

## 2016-03-13 MED ORDER — WARFARIN SODIUM 7.5 MG PO TABS
7.5000 mg | ORAL_TABLET | Freq: Once | ORAL | Status: AC
  Administered 2016-03-13: 7.5 mg via ORAL
  Filled 2016-03-13: qty 1

## 2016-03-13 MED ORDER — POTASSIUM CHLORIDE CRYS ER 20 MEQ PO TBCR
40.0000 meq | EXTENDED_RELEASE_TABLET | Freq: Once | ORAL | Status: AC
  Administered 2016-03-13: 40 meq via ORAL
  Filled 2016-03-13: qty 2

## 2016-03-13 MED ORDER — WARFARIN - PHARMACIST DOSING INPATIENT
Freq: Every day | Status: DC
  Administered 2016-03-15: 19:00:00

## 2016-03-13 NOTE — Discharge Instructions (Signed)

## 2016-03-13 NOTE — Progress Notes (Signed)
PROGRESS NOTE    [name]  MEQ:[id number] DOB: June 18, 1940 DOA: 03/12/2016 PCP: Irven Shelling, MD   Brief Narrative:  [name] is a 75 y.o. male with medical history significant Hx of Prior CVA, Left CEA, Craniotomy for Meningioma, HTN, HLD, CKD Stage 3 who was recently hospitalizatied October 21 to 23rd, for new-onset atrial fibrillation in the setting of sepsis, due to urinary tract infection. The day he was discharged he noticed left knee pain, the pain has been sharp in nature, worsening in intensity up to a 10 out of 10, no radiation, associated with local erythema, tenderness, decreased ambulation. Denies any associated fevers or chills. Patient has not been on diuretics, no history of gout. Patient was seen yesterday by his regular physician, noted left knee erythema, sent to orthopedics for suspected infected joint, left knee was intervened with arthrocentesis, the fluid was reported to be clear, he was sent to the hospital for further evaluation treatment for cellulitis of the Left Knee.   Assessment & Plan:   Active Problems:   Atrial fibrillation, new onset ([place])   Renal insufficiency   Atrial fibrillation (H[facility])   Acute urinary retention   Cellulitis of left knee  1. Left Knee Cellulitis.  -WBC went from 13.3 -> 14.4 -Procalcitonin was 7.20 likely from Infection -C/w Broad-spectrum antibiotics including Vancomycin and Zosyn dosing per Pharmacy -Will follow up on cultures, cell count and temperature curve. -Will request records from the orthopedic clinic.  -Will need synovial fluid Gram stain and culture from yesterday's Arthrocentesis  -Gentle hydration with normal saline 75 mL per hour -Morphone 1 mg IV q4hprn for Pain Control and Zofran 4 mg po and IV for N/V -Orthopedics Following and Ordered MRI of Knee and above Knee to R/o Abscess -Repeat CBC in AM  2. Atrial Fibrillation.  -Continue Rate control with Diltiazem 120 mg po daily and Metoprolol  50 mg po BID. -Anticoagulation with Lovenox and Warfarin as INR was not therapeutic. -PT/INR was 19.9/1.67  3. BPH/ Urinary obstruction.  -Will continue Flomax 0.4 -Patient has been diagnosed with urinary retention,  -continue Foley catheter until seen by Urologist at next scheduled Appointment  4. Hypokalemia -Patient's Potassium was 3.4 -Repleted with Potassium Chloride 40 mEQ -Repeat BMP in AM  5. Hypertension -C/w Metoprolol and Diltiazem  6. Dyslipidemia.  -Continue Rosuvastatin 40 mg po Daily.  7. Chronic kidney disease stage III.  -EGFR was 47 -Patient's BUN/Cr went from 38/2.06 -> 19/1.60 -C/w IVF at 75 mL/hr -Avoid Nephrotoxic Medications  8. Hx of Meningioma -No active Issues  9. Hx of CVA and Left CEA -Continue Rosuvastatin 40 mg -Patient has an Aspirin Allergy  DVT prophylaxis: Lovenox and Coumadin Code Status: Full Family Communication: No Family at Bedside Disposition Plan: Home with Home Health vs. SNF  Consultants:   Orthopedics  Procedures: MRI of the Knee  Antimicrobials:  IV Vancomycin 03/12/16 --->  IV Zosyn 03/12/16 --->  Subjective: Seen and examined at bedside and stated his knee looked a little better. Stated it was very tender to touch. No CP/SOB or N/V/Abdominal pain. Stated he has to keep Foley in until he sees Urology Nov 5. No other complaints or concerns.   Objective: Vitals:   03/13/16 0301 03/13/16 0456 03/13/16 0552 03/13/16 1058  BP:  (!) 108/46 (!) 119/49 (!) 125/41  Pulse:  60 61 66  Resp:  19    Temp:  98.5 F (36.9 C)    TempSrc:      SpO2:  94%  97%  Weight: 106 kg (233 lb 11 oz)       Intake/Output Summary (Last 24 hours) at 03/13/16 1306 Last data filed at 03/13/16 0300  Gross per 24 hour  Intake           398.75 ml  Output                0 ml  Net           398.75 ml   Filed Weights   03/13/16 0301  Weight: 106 kg (233 lb 11 oz)    Examination: Physical Exam:  Constitutional: WN/WD, NAD and  appears calm and comfortable Eyes: Lids and conjunctivae normal, sclerae anicteric  ENMT: External Ears, Nose appear normal. Grossly normal hearing.  Neck: Appears normal, supple, no cervical masses, normal ROM, no appreciable thyromegaly, no Appreciable JVD Respiratory: Clear to auscultation bilaterally, no wheezing, rales, rhonchi or crackles. Normal respiratory effort and patient is not tachypenic. No accessory muscle use.  Cardiovascular: Irregularly Irregular, no murmurs / rubs / gallops. S1 and S2 auscultated. No extremity edema. Abdomen: Soft, non-tender, non-distended. No masses palpated. No appreciable hepatosplenomegaly. Bowel sounds positive.  GU: Deferred. Indwelling Foley making dark urine.  Musculoskeletal: No clubbing / cyanosis of digits/nails. Very warm and swollen Left Knee with erythema. Bandage over area where arthrocentesis was preformed.  Skin: No rashes, lesions, ulcers.  Neurologic: CN 2-12 grossly intact with no focal deficits. Sensation intact in all 4 Extremities. Romberg sign cerebellar reflexes not assessed.  Psychiatric: Normal judgment and insight. Alert and oriented x 3. Normal mood and appropriate affect.   Data Reviewed: I have personally reviewed following labs and imaging studies  CBC:  Recent Labs Lab 03/06/16 1943 03/07/16 0522 03/08/16 0140 03/13/16 0423  WBC 21.5* 15.5* 13.3* 14.4*  NEUTROABS 18.9*  --   --   --   HGB 15.6 13.4 13.0 11.7*  HCT 44.8 39.6 38.9* 36.4*  MCV 85.8 87.8 88.4 90.3  PLT 135* 135* 160 449   Basic Metabolic Panel:  Recent Labs Lab 03/06/16 1943 03/08/16 0140 03/13/16 0423  NA 132* 141 143  K 3.4* 3.3* 3.4*  CL 96* 111 114*  CO2 22 22 23   GLUCOSE 146* 123* 128*  BUN 45* 38* 19  CREATININE 2.49* 2.06* 1.60*  CALCIUM 8.6* 7.8* 7.7*   GFR: Estimated Creatinine Clearance: 48.6 mL/min (by C-G formula based on SCr of 1.6 mg/dL (H)). Liver Function Tests:  Recent Labs Lab 03/13/16 0423  AST 44*  ALT 46    ALKPHOS 89  BILITOT 0.7  PROT 4.8*  ALBUMIN 2.0*   No results for input(s): LIPASE, AMYLASE in the last 168 hours. No results for input(s): AMMONIA in the last 168 hours. Coagulation Profile:  Recent Labs Lab 03/07/16 0522 03/12/16 1819 03/13/16 0423  INR 1.15 1.45 1.67   Cardiac Enzymes:  Recent Labs Lab 03/06/16 1943 03/07/16 0522 03/07/16 0905 03/07/16 1321  TROPONINI 0.03* 0.04* 0.03* <0.03   BNP (last 3 results) No results for input(s): PROBNP in the last 8760 hours. HbA1C: No results for input(s): HGBA1C in the last 72 hours. CBG: No results for input(s): GLUCAP in the last 168 hours. Lipid Profile: No results for input(s): CHOL, HDL, LDLCALC, TRIG, CHOLHDL, LDLDIRECT in the last 72 hours. Thyroid Function Tests: No results for input(s): TSH, T4TOTAL, FREET4, T3FREE, THYROIDAB in the last 72 hours. Anemia Panel: No results for input(s): VITAMINB12, FOLATE, FERRITIN, TIBC, IRON, RETICCTPCT in the last 72 hours. Sepsis Labs:  Recent Labs Lab 03/06/16 2335 03/07/16 0522 03/07/16 0905  PROCALCITON  --  7.20  --   LATICACIDVEN 1.92* 1.2 1.0    Recent Results (from the past 240 hour(s))  Urine culture     Status: None   Collection Time: 03/06/16  7:50 PM  Result Value Ref Range Status   Specimen Description URINE, CLEAN CATCH  Final   Special Requests NONE  Final   Culture NO GROWTH Performed at [facility]   Final   Report Status 03/08/2016 FINAL  Final  Blood Culture (routine x 2)     Status: None   Collection Time: 03/06/16  8:23 PM  Result Value Ref Range Status   Specimen Description BLOOD R WRIST  Final   Special Requests BOTTLES DRAWN AEROBIC AND ANAEROBIC 5ML EA  Final   Culture   Final    NO GROWTH 5 DAYS Performed at [facility]    Report Status 03/12/2016 FINAL  Final  Blood Culture (routine x 2)     Status: None   Collection Time: 03/06/16  9:04 PM  Result Value Ref Range Status   Specimen Description BLOOD LEFT  ANTECUBITAL  Final   Special Requests BOTTLES DRAWN AEROBIC AND ANAEROBIC [place] EA  Final   Culture   Final    NO GROWTH 5 DAYS Performed at [facility]    Report Status 03/12/2016 FINAL  Final    Radiology Studies: No results found.  Scheduled Meds: . cholecalciferol  1,000 Units Oral Daily  . diltiazem  120 mg Oral Daily  . enoxaparin  150 mg Subcutaneous Q24H  . metoprolol  50 mg Oral BID  . piperacillin-tazobactam (ZOSYN)  IV  3.375 g Intravenous Q8H  . rosuvastatin  40 mg Oral Daily  . sodium chloride flush  3 mL Intravenous Q12H  . sodium chloride flush  3 mL Intravenous Q12H  . tamsulosin  0.4 mg Oral QPC supper  . vancomycin  1,250 mg Intravenous Q24H  . [START ON 03/15/2016] vitamin B-12  1,000 mcg Oral Once per day on Mon Thu  . Warfarin - Physician Dosing Inpatient   Does not apply q1800   Continuous Infusions: . sodium chloride 75 mL/hr at 03/13/16 0143    LOS: 1 day   Kerney Elbe, DO [facility] Pager [phone]  If 7PM-7AM, please contact night-coverage [email or web address] Password [email or web address] 03/13/2016, 1:06 PM

## 2016-03-13 NOTE — Progress Notes (Signed)
ANTICOAGULATION CONSULT NOTE - Initial Consult  Pharmacy Consult for warfarin Indication: atrial fibrillation  Allergies  Allergen Reactions  . Asa [Aspirin]     Severe skin bruising   . Immune Globulins Other (See Comments)    Tetanus Immunes Globulins    (  Pt. Cannot take the Flu shot ) [name]     Patient Measurements: Weight: 233 lb 11 oz (106 kg)  Vital Signs: Temp: 97.9 F (36.6 C) (10/28 1353) Temp Source: Oral (10/28 1353) BP: 126/54 (10/28 1353) Pulse Rate: 76 (10/28 1353)  Labs:  Recent Labs  03/12/16 1819 03/13/16 0423  HGB  --  11.7*  HCT  --  36.4*  PLT  --  242  LABPROT 17.7* 19.9*  INR 1.45 1.67  CREATININE  --  1.60*    Estimated Creatinine Clearance: 48.6 mL/min (by C-G formula based on SCr of 1.6 mg/dL (H)).  Assessment: 89 yom presented to the hospital with knee pain. He is on chronic coumadin for history of afib. INR is low at 1.67. H/H slightly low and platelets are WNL. No bleeding noted.   Goal of Therapy:  INR 2-3 Monitor platelets by anticoagulation protocol: Yes   Plan:  - Warfarin 7.5mg  PO x 1 tonight - Daily INR  [name], Rande Lawman 03/13/2016,5:21 PM

## 2016-03-13 NOTE — Progress Notes (Addendum)
Subjective: The patient reports mild to moderate improvement in his left knee discomfort. He denies fever, chills, or sweats.  Objective: Vital signs in last 24 hours: Temp:  [98.3 F (36.8 C)-98.6 F (37 C)] 98.5 F (36.9 C) (10/28 0456) Pulse Rate:  [60-63] 61 (10/28 0552) Resp:  [18-19] 19 (10/28 0456) BP: (108-140)/(42-55) 119/49 (10/28 0552) SpO2:  [94 %-100 %] 94 % (10/28 0456) Weight:  [106 kg (233 lb 11 oz)] 106 kg (233 lb 11 oz) (10/28 0301)  Intake/Output from previous day: 10/27 0701 - 10/28 0700 In: 398.8 [I.V.:348.8; IV Piggyback:50] Out: -  Intake/Output this shift: No intake/output data recorded.   Recent Labs  03/13/16 0423  HGB 11.7*    Recent Labs  03/13/16 0423  WBC 14.4*  RBC 4.03*  HCT 36.4*  PLT 242    Recent Labs  03/13/16 0423  NA 143  K 3.4*  CL 114*  CO2 23  BUN 19  CREATININE 1.60*  GLUCOSE 128*  CALCIUM 7.7*    Recent Labs  03/12/16 1819 03/13/16 0423  INR 1.45 1.67  Left knee exam: Still has moderate pain with range of motion of the knee but improved. Area of redness just proximal to the prepatellar bursa is smaller than yesterday. Aspirate obtained from left knee and office yesterday was clear. I will check the results of that today. Able to do straight leg raise. Calf is soft.  Assessment/Plan: Cellulitis left knee moderately improved after starting IV antibiotics. Plan: Will need MRI of the left knee and the area just above the left knee to rule out abscess. Doubt has abscess. Continue IV antibiotics per hospitalists. We will follow the patient.   [name] G 03/13/2016, 9:50 AM

## 2016-03-13 NOTE — Progress Notes (Signed)
Prothrombin time 19.9, potassium 3.4 and BP 119/49. Doctor Donnal Debar notified at 0600.

## 2016-03-14 DIAGNOSIS — R600 Localized edema: Secondary | ICD-10-CM | POA: Diagnosis not present

## 2016-03-14 DIAGNOSIS — I4891 Unspecified atrial fibrillation: Secondary | ICD-10-CM | POA: Diagnosis not present

## 2016-03-14 DIAGNOSIS — L03116 Cellulitis of left lower limb: Secondary | ICD-10-CM | POA: Diagnosis not present

## 2016-03-14 DIAGNOSIS — N289 Disorder of kidney and ureter, unspecified: Secondary | ICD-10-CM | POA: Diagnosis not present

## 2016-03-14 DIAGNOSIS — R338 Other retention of urine: Secondary | ICD-10-CM | POA: Diagnosis not present

## 2016-03-14 LAB — COMPREHENSIVE METABOLIC PANEL
ALK PHOS: 95 U/L (ref 38–126)
ALT: 101 U/L — AB (ref 17–63)
ANION GAP: 5 (ref 5–15)
AST: 123 U/L — ABNORMAL HIGH (ref 15–41)
Albumin: 1.9 g/dL — ABNORMAL LOW (ref 3.5–5.0)
BILIRUBIN TOTAL: 0.4 mg/dL (ref 0.3–1.2)
BUN: 15 mg/dL (ref 6–20)
CALCIUM: 7.9 mg/dL — AB (ref 8.9–10.3)
CO2: 24 mmol/L (ref 22–32)
CREATININE: 1.57 mg/dL — AB (ref 0.61–1.24)
Chloride: 115 mmol/L — ABNORMAL HIGH (ref 101–111)
GFR, EST AFRICAN AMERICAN: 48 mL/min — AB (ref >60)
GFR, EST NON AFRICAN AMERICAN: 41 mL/min — AB (ref >60)
Glucose, Bld: 111 mg/dL — ABNORMAL HIGH (ref 65–99)
Potassium: 3.8 mmol/L (ref 3.5–5.1)
SODIUM: 144 mmol/L (ref 135–145)
TOTAL PROTEIN: 4.9 g/dL — AB (ref 6.5–8.1)

## 2016-03-14 LAB — CBC WITH DIFFERENTIAL/PLATELET
Basophils Absolute: 0 10*3/uL (ref 0.0–0.1)
Basophils Relative: 0 %
EOS ABS: 0.2 10*3/uL (ref 0.0–0.7)
Eosinophils Relative: 2 %
HEMATOCRIT: 36.7 % — AB (ref 39.0–52.0)
HEMOGLOBIN: 11.5 g/dL — AB (ref 13.0–17.0)
LYMPHS ABS: 1.8 10*3/uL (ref 0.7–4.0)
LYMPHS PCT: 13 %
MCH: 29 pg (ref 26.0–34.0)
MCHC: 31.3 g/dL (ref 30.0–36.0)
MCV: 92.4 fL (ref 78.0–100.0)
MONOS PCT: 8 %
Monocytes Absolute: 1.1 10*3/uL — ABNORMAL HIGH (ref 0.1–1.0)
NEUTROS PCT: 77 %
Neutro Abs: 10.8 10*3/uL — ABNORMAL HIGH (ref 1.7–7.7)
Platelets: 261 10*3/uL (ref 150–400)
RBC: 3.97 MIL/uL — ABNORMAL LOW (ref 4.22–5.81)
RDW: 14.5 % (ref 11.5–15.5)
WBC: 13.9 10*3/uL — ABNORMAL HIGH (ref 4.0–10.5)

## 2016-03-14 LAB — PHOSPHORUS: PHOSPHORUS: 3.5 mg/dL (ref 2.5–4.6)

## 2016-03-14 LAB — MAGNESIUM: MAGNESIUM: 2 mg/dL (ref 1.7–2.4)

## 2016-03-14 LAB — PROTIME-INR
INR: 2.06
PROTHROMBIN TIME: 23.5 s — AB (ref 11.4–15.2)

## 2016-03-14 MED ORDER — ENOXAPARIN SODIUM 150 MG/ML ~~LOC~~ SOLN
150.0000 mg | SUBCUTANEOUS | Status: AC
  Administered 2016-03-14: 150 mg via SUBCUTANEOUS
  Filled 2016-03-14: qty 1

## 2016-03-14 MED ORDER — WARFARIN SODIUM 5 MG PO TABS
5.0000 mg | ORAL_TABLET | Freq: Once | ORAL | Status: AC
  Administered 2016-03-14: 5 mg via ORAL
  Filled 2016-03-14: qty 1

## 2016-03-14 NOTE — Progress Notes (Addendum)
Subjective: The patient reports his left knee is feeling better. He has not been out of bed. Foley catheter is in place which was in place prior to admission. He had an MRI of the left knee and the results are pending.   Objective: Vital signs in last 24 hours: Temp:  [97.9 F (36.6 C)-98.5 F (36.9 C)] 98.4 F (36.9 C) (10/29 0540) Pulse Rate:  [55-76] 55 (10/29 0540) Resp:  [16-19] 18 (10/29 0540) BP: (117-134)/(41-57) 121/50 (10/29 0540) SpO2:  [94 %-97 %] 94 % (10/29 0540) Weight:  [104 kg (229 lb 4.5 oz)] 104 kg (229 lb 4.5 oz) (10/29 0246)  Intake/Output from previous day: 10/28 0701 - 10/29 0700 In: 1841.3 [P.O.:360; I.V.:1031.3; IV Piggyback:450] Out: 1000 [Urine:1000] Intake/Output this shift: No intake/output data recorded.   Recent Labs  03/13/16 0423 03/14/16 0545  HGB 11.7* 11.5*    Recent Labs  03/13/16 0423 03/14/16 0545  WBC 14.4* 13.9*  RBC 4.03* 3.97*  HCT 36.4* 36.7*  PLT 242 261    Recent Labs  03/13/16 0423 03/14/16 0545  NA 143 144  K 3.4* 3.8  CL 114* 115*  CO2 23 24  BUN 19 15  CREATININE 1.60* 1.57*  GLUCOSE 128* 111*  CALCIUM 7.7* 7.9*   03/13/2016 Synovial fluid: 380 cells, monos, gram stain neg (attached)  Recent Labs  03/13/16 0423 03/14/16 0545  INR 1.67 2.06   Left knee exam: Still has moderate swelling. Erythema is substantially improved. Able to do a straight leg raise. Passive range of motion 0 to 70. No redness or streaking up the leg.   Assessment/Plan: Cellulitis left knee/leg, improved with IV antibiotics. Plan: Awaiting results of MRI. At this point I think this is a nonsurgical problem. Continue IV antibiotics. I encouraged the patient to get out of bed to the chair weightbearing as tolerated on the left. Dr. Mayer Camel will reevaluate the patient tomorrow.   Hensley G 03/14/2016, 9:46 AM   03/13/2016 Synovial Fluid: luid:

## 2016-03-14 NOTE — Progress Notes (Signed)
ANTICOAGULATION CONSULT NOTE -[place] for warfarin Indication: atrial fibrillation, history of stroke  Allergies  Allergen Reactions  . Asa [Aspirin]     Severe skin bruising   . Immune Globulins Other (See Comments)    Tetanus Immunes Globulins    (  Pt. Cannot take the Flu shot ) [name]     Patient Measurements: Weight: 229 lb 4.5 oz (104 kg)  Vital Signs: Temp: 98.4 F (36.9 C) (10/29 0540) BP: 121/50 (10/29 0540) Pulse Rate: 55 (10/29 0540)  Labs:  Recent Labs  03/12/16 1819 03/13/16 0423 03/14/16 0545  HGB  --  11.7* 11.5*  HCT  --  36.4* 36.7*  PLT  --  242 261  LABPROT 17.7* 19.9* 23.5*  INR 1.45 1.67 2.06  CREATININE  --  1.60* 1.57*    Estimated Creatinine Clearance: 49.1 mL/min (by C-G formula based on SCr of 1.57 mg/dL (H)).  Assessment: 64 yom presented to the hospital with knee pain. He is on chronic coumadin for history of afib. INR is within range this AM at 2.06. H/H slightly low and platelets are WNL. No bleeding noted. Patient is also on Enoxaparin 150 mg every 24 hours.   PTA dose: 5 mg daily    Goal of Therapy:  INR 2-3 Monitor platelets by anticoagulation protocol: Yes   Plan:  - Warfarin 5 mg PO x 1 tonight - Daily INR - Monitor for s/sx of bleeding - Can consider stopping Lovenox once patient's INR is therapeutic > 24 hours    Ihor Austin, PharmD PGY1 Pharmacy Resident  [phone]  03/14/2016,9:41 AM

## 2016-03-14 NOTE — Progress Notes (Signed)
PROGRESS NOTE    [name]  DXI:[id number] DOB: 1940-08-09 DOA: 03/12/2016 PCP: Irven Shelling, MD   Brief Narrative:  [name] is a 75 y.o. male with medical history significant Hx of Prior CVA, Left CEA, Craniotomy for Meningioma, HTN, HLD, CKD Stage 3 who was recently hospitalizatied October 21 to 23rd, for new-onset atrial fibrillation in the setting of sepsis, due to urinary tract infection. The day he was discharged he noticed left knee pain, the pain has been sharp in nature, worsening in intensity up to a 10 out of 10, no radiation, associated with local erythema, tenderness, decreased ambulation. Denies any associated fevers or chills. Patient has not been on diuretics, no history of gout. Patient was seen yesterday by his regular physician, noted left knee erythema, sent to orthopedics for suspected infected joint, left knee was intervened with arthrocentesis, the fluid was reported to be clear, he was sent to the hospital for further evaluation treatment for cellulitis of the Left Knee.   Assessment & Plan:   Active Problems:   Atrial fibrillation, new onset ([place])   Renal insufficiency   Atrial fibrillation ([facility])   Acute urinary retention   Cellulitis of left knee  1. Left Knee Cellulitis., improving clinically -WBC went from 13.3 -> 14.4 -> 13.9 -Procalcitonin was 7.20 on admission likely from Infection -C/w Broad-Spectrum antibiotics including Vancomycin and Zosyn dosing per Pharmacy -Outpatient Arthrocentesis showed 380 Total Nucleated Cells and 78%; Gram Stain showed no WBC -Will Request Records from the Orthopedic Clinic.  -C/w Gentle hydration with normal saline 75 mL per hour -Morphone 1 mg IV q4hprn for Pain Control and Zofran 4 mg po and IV for N/V -Orthopedics Following and Ordered MRI of Knee and above Knee to R/o Abscess -MRI still pending read; Was done at [facility] last night.  -Repeat CBC in AM  2. Atrial Fibrillation.  -Continue Rate  control with Diltiazem 120 mg po daily and Metoprolol 50 mg po BID. -Anticoagulation with Warfarin as INR is now Therapeutic. -PT/INR was 19.9/1.67 -> 23.5/2.06 -Will keep Lovenox Full Dose today and D/C in AM as INR is therapeutic >24 hours  3. BPH/ Urinary Obstruction.  -Will continue Flomax 0.4 -Patient has been diagnosed with urinary retention,  -Continue Foley catheter until seen by Urologist at next scheduled Appointment  4. Hypokalemia, improved -Patient's Potassium was 3.4 -> 3.8 -Repleted with Potassium Chloride 40 mEQ -Repeat BMP in AM  5. Hypertension -BP ranged from 117-134 SBP and 42-57 DBP -C/w Metoprolol and Diltiazem  6. Dyslipidemia.  -Continue Rosuvastatin 40 mg po Daily.  7. Chronic kidney disease stage III.  -EGFR was 47 -Patient's BUN/Cr went from 38/2.06 -> 19/1.60 -> 15/1.57 -C/w IVF at 75 mL/hr -Avoid Nephrotoxic Medications  8. Hx of Meningioma -No active Issues  9. Hx of CVA and Left CEA -Continue Rosuvastatin 40 mg -Patient has an Aspirin Allergy  DVT prophylaxis: Lovenox and Coumadin Code Status: Full Family Communication: No Family at Bedside Disposition Plan: Home with Home Health vs. SNF  Consultants:   Orthopedics  Procedures: MRI of the Knee  Antimicrobials:  IV Vancomycin 03/12/16 --->  IV Zosyn 03/12/16 --->  Subjective: Seen and examined at bedside and stated his knee looked improved and better. States he had a rough night last night because the MRI occurred around midnight and he didn't sleep very well. No CP/SOB or N/V/Abdominal pain. No other complaints or concerns.   Objective: Vitals:   03/14/16 0246 03/14/16 0540 03/14/16 0950 03/14/16 1408  BP:  (!) 121/50 (!) 129/41 (!) 121/42  Pulse:  (!) 55 62 (!) 54  Resp:  18  20  Temp:  98.4 F (36.9 C)  97.7 F (36.5 C)  TempSrc:    Oral  SpO2:  94% (!) 79% 98%  Weight: 104 kg (229 lb 4.5 oz)       Intake/Output Summary (Last 24 hours) at 03/14/16 1431 Last data  filed at 03/14/16 1300  Gross per 24 hour  Intake          2441.25 ml  Output             1050 ml  Net          1391.25 ml   Filed Weights   03/13/16 0301 03/14/16 0246  Weight: 106 kg (233 lb 11 oz) 104 kg (229 lb 4.5 oz)    Examination: Physical Exam:  Constitutional: WN/WD, NAD and appears calm and comfortable Eyes: Lids and conjunctivae normal, sclerae anicteric  ENMT: External Ears, Nose appear normal. Grossly normal hearing.  Neck: Appears normal, supple, no cervical masses, normal ROM, no appreciable thyromegaly, no Appreciable JVD Respiratory: Clear to auscultation bilaterally, no wheezing, rales, rhonchi or crackles. Normal respiratory effort and patient is not tachypenic. No accessory muscle use.  Cardiovascular: Irregularly Irregular, no murmurs / rubs / gallops. S1 and S2 auscultated. No extremity edema. Abdomen: Soft, non-tender, non-distended. No masses palpated. No appreciable hepatosplenomegaly. Bowel sounds positive.  GU: Deferred. Indwelling Foley making dark urine.  Musculoskeletal: No clubbing / cyanosis of digits/nails. Less warm and less swollen Left Knee with mild erythema. Bandage over area where arthrocentesis was preformed.  Skin: No rashes, lesions, ulcers.  Neurologic: CN 2-12 grossly intact with no focal deficits. Sensation intact in all 4 Extremities. Romberg sign cerebellar reflexes not assessed.  Psychiatric: Normal judgment and insight. Alert and oriented x 3. Normal mood and appropriate affect.   Data Reviewed: I have personally reviewed following labs and imaging studies  CBC:  Recent Labs Lab 03/08/16 0140 03/13/16 0423 03/14/16 0545  WBC 13.3* 14.4* 13.9*  NEUTROABS  --   --  10.8*  HGB 13.0 11.7* 11.5*  HCT 38.9* 36.4* 36.7*  MCV 88.4 90.3 92.4  PLT 160 242 076   Basic Metabolic Panel:  Recent Labs Lab 03/08/16 0140 03/13/16 0423 03/14/16 0545  NA 141 143 144  K 3.3* 3.4* 3.8  CL 111 114* 115*  CO2 _0 GLUCOSE 123* 128*  111*  BUN 38* 19 15  CREATININE 2.06* 1.60* 1.57*  CALCIUM 7.8* 7.7* 7.9*  MG  --   --  2.0  PHOS  --   --  3.5   GFR: Estimated Creatinine Clearance: 49.1 mL/min (by C-G formula based on SCr of 1.57 mg/dL (H)). Liver Function Tests:  Recent Labs Lab 03/13/16 0423 03/14/16 0545  AST 44* 123*  ALT 46 101*  ALKPHOS 89 95  BILITOT 0.7 0.4  PROT 4.8* 4.9*  ALBUMIN 2.0* 1.9*   No results for input(s): LIPASE, AMYLASE in the last 168 hours. No results for input(s): AMMONIA in the last 168 hours. Coagulation Profile:  Recent Labs Lab 03/12/16 1819 03/13/16 0423 03/14/16 0545  INR 1.45 1.67 2.06   Cardiac Enzymes: No results for input(s): CKTOTAL, CKMB, CKMBINDEX, TROPONINI in the last 168 hours. BNP (last 3 results) No results for input(s): PROBNP in the last 8760 hours. HbA1C: No results for input(s): HGBA1C in the last 72 hours. CBG: No results for input(s): GLUCAP in  the last 168 hours. Lipid Profile: No results for input(s): CHOL, HDL, LDLCALC, TRIG, CHOLHDL, LDLDIRECT in the last 72 hours. Thyroid Function Tests: No results for input(s): TSH, T4TOTAL, FREET4, T3FREE, THYROIDAB in the last 72 hours. Anemia Panel: No results for input(s): VITAMINB12, FOLATE, FERRITIN, TIBC, IRON, RETICCTPCT in the last 72 hours. Sepsis Labs: No results for input(s): PROCALCITON, LATICACIDVEN in the last 168 hours.  Recent Results (from the past 240 hour(s))  Urine culture     Status: None   Collection Time: 03/06/16  7:50 PM  Result Value Ref Range Status   Specimen Description URINE, CLEAN CATCH  Final   Special Requests NONE  Final   Culture NO GROWTH Performed at [facility]   Final   Report Status 03/08/2016 FINAL  Final  Blood Culture (routine x 2)     Status: None   Collection Time: 03/06/16  8:23 PM  Result Value Ref Range Status   Specimen Description BLOOD R WRIST  Final   Special Requests BOTTLES DRAWN AEROBIC AND ANAEROBIC 5ML EA  Final   Culture   Final     NO GROWTH 5 DAYS Performed at [facility]    Report Status 03/12/2016 FINAL  Final  Blood Culture (routine x 2)     Status: None   Collection Time: 03/06/16  9:04 PM  Result Value Ref Range Status   Specimen Description BLOOD LEFT ANTECUBITAL  Final   Special Requests BOTTLES DRAWN AEROBIC AND ANAEROBIC [place] EA  Final   Culture   Final    NO GROWTH 5 DAYS Performed at [facility]    Report Status 03/12/2016 FINAL  Final    Radiology Studies: No results found.  Scheduled Meds: . cholecalciferol  1,000 Units Oral Daily  . diltiazem  120 mg Oral Daily  . enoxaparin  150 mg Subcutaneous Q24H  . metoprolol  50 mg Oral BID  . piperacillin-tazobactam (ZOSYN)  IV  3.375 g Intravenous Q8H  . rosuvastatin  40 mg Oral Daily  . sodium chloride flush  3 mL Intravenous Q12H  . sodium chloride flush  3 mL Intravenous Q12H  . tamsulosin  0.4 mg Oral QPC supper  . vancomycin  1,250 mg Intravenous Q24H  . [START ON 03/15/2016] vitamin B-12  1,000 mcg Oral Once per day on Mon Thu  . warfarin  5 mg Oral ONCE-1800  . Warfarin - Pharmacist Dosing Inpatient   Does not apply q1800   Continuous Infusions: . sodium chloride 75 mL/hr at 03/13/16 1853    LOS: 2 days   Kerney Elbe, DO [facility] Pager [phone]  If 7PM-7AM, please contact night-coverage [email or web address] Password [email or web address] 03/14/2016, 2:31 PM

## 2016-03-15 DIAGNOSIS — L03116 Cellulitis of left lower limb: Secondary | ICD-10-CM | POA: Diagnosis not present

## 2016-03-15 DIAGNOSIS — R338 Other retention of urine: Secondary | ICD-10-CM | POA: Diagnosis not present

## 2016-03-15 DIAGNOSIS — N289 Disorder of kidney and ureter, unspecified: Secondary | ICD-10-CM | POA: Diagnosis not present

## 2016-03-15 DIAGNOSIS — I4891 Unspecified atrial fibrillation: Secondary | ICD-10-CM | POA: Diagnosis not present

## 2016-03-15 LAB — CBC WITH DIFFERENTIAL/PLATELET
Basophils Absolute: 0 10*3/uL (ref 0.0–0.1)
Basophils Relative: 0 %
EOS ABS: 0.2 10*3/uL (ref 0.0–0.7)
EOS PCT: 2 %
HCT: 37.8 % — ABNORMAL LOW (ref 39.0–52.0)
Hemoglobin: 12 g/dL — ABNORMAL LOW (ref 13.0–17.0)
LYMPHS ABS: 2 10*3/uL (ref 0.7–4.0)
LYMPHS PCT: 14 %
MCH: 29.1 pg (ref 26.0–34.0)
MCHC: 31.7 g/dL (ref 30.0–36.0)
MCV: 91.7 fL (ref 78.0–100.0)
MONO ABS: 0.8 10*3/uL (ref 0.1–1.0)
Monocytes Relative: 6 %
Neutro Abs: 11.1 10*3/uL — ABNORMAL HIGH (ref 1.7–7.7)
Neutrophils Relative %: 78 %
PLATELETS: 284 10*3/uL (ref 150–400)
RBC: 4.12 MIL/uL — ABNORMAL LOW (ref 4.22–5.81)
RDW: 14.3 % (ref 11.5–15.5)
WBC: 14.1 10*3/uL — ABNORMAL HIGH (ref 4.0–10.5)

## 2016-03-15 LAB — COMPREHENSIVE METABOLIC PANEL
ALT: 90 U/L — ABNORMAL HIGH (ref 17–63)
ANION GAP: 3 — AB (ref 5–15)
AST: 70 U/L — ABNORMAL HIGH (ref 15–41)
Albumin: 2.1 g/dL — ABNORMAL LOW (ref 3.5–5.0)
Alkaline Phosphatase: 87 U/L (ref 38–126)
BUN: 12 mg/dL (ref 6–20)
CHLORIDE: 117 mmol/L — AB (ref 101–111)
CO2: 25 mmol/L (ref 22–32)
CREATININE: 1.39 mg/dL — AB (ref 0.61–1.24)
Calcium: 8.2 mg/dL — ABNORMAL LOW (ref 8.9–10.3)
GFR, EST AFRICAN AMERICAN: 56 mL/min — AB (ref >60)
GFR, EST NON AFRICAN AMERICAN: 48 mL/min — AB (ref >60)
Glucose, Bld: 99 mg/dL (ref 65–99)
POTASSIUM: 3.8 mmol/L (ref 3.5–5.1)
SODIUM: 145 mmol/L (ref 135–145)
Total Bilirubin: 0.6 mg/dL (ref 0.3–1.2)
Total Protein: 5.2 g/dL — ABNORMAL LOW (ref 6.5–8.1)

## 2016-03-15 LAB — PHOSPHORUS: PHOSPHORUS: 3.3 mg/dL (ref 2.5–4.6)

## 2016-03-15 LAB — MAGNESIUM: MAGNESIUM: 1.9 mg/dL (ref 1.7–2.4)

## 2016-03-15 LAB — PROTIME-INR
INR: 2.22
Prothrombin Time: 25 seconds — ABNORMAL HIGH (ref 11.4–15.2)

## 2016-03-15 MED ORDER — WARFARIN SODIUM 5 MG PO TABS
5.0000 mg | ORAL_TABLET | Freq: Every day | ORAL | Status: DC
  Administered 2016-03-15 – 2016-03-16 (×2): 5 mg via ORAL
  Filled 2016-03-15 (×2): qty 1

## 2016-03-15 NOTE — Progress Notes (Signed)
Pharmacy Antibiotic Note  [name] is a 75 y.o. male admitted on 03/12/2016 with left knee cellulitis.  Pharmacy has been consulted for vancomycin and Zosyn dosing. Of note, he had recent admission for new onset Afib and UTI. He also was seen by Ortho 10/27 and had arthrocentesis with fluid reported to be clear. CKD 3 noted, SCr down to 1.39, norm CrCl ~46 ml/min. Cellulitis improved but WBC count remains ~14s.   Vancomycin dose from last night and Zosyn dose from this morning did not infuse per RN. Antibiotic times rescheduled.  Plan:  Continue Zosyn 3.375 g IV q8h to be infused over 4 hours Continue vancomycin 1250 mg IV q24h Monitor renal function and clinical progress Vancomycin trough as clinically indicated - will hold off as expect to narrow soon  Weight: 232 lb 5.8 oz (105.4 kg) (in bed, knee is painful to place weight on)  Temp (24hrs), Avg:98 F (36.7 C), Min:97.7 F (36.5 C), Max:98.2 F (36.8 C)   Recent Labs Lab 03/13/16 0423 03/14/16 0545 03/15/16 0855  WBC 14.4* 13.9* 14.1*  CREATININE 1.60* 1.57* 1.39*    Estimated Creatinine Clearance: 55.9 mL/min (by C-G formula based on SCr of 1.39 mg/dL (H)).    Allergies  Allergen Reactions  . Asa [Aspirin]     Severe skin bruising   . Immune Globulins Other (See Comments)    Tetanus Immunes Globulins    (  Pt. Cannot take the Flu shot ) Guillian Barre     Antimicrobials this admission: Vancomycin 10/27 >>  Zosyn 10/27 >>    Dose adjustments this admission:     Microbiology results: 10/21 BCx: NEG 10/21 UCx: NEG  10/28 BCx: NGTD 10/27 synovial aspiration (outpt): NGTD   Thank you for allowing pharmacy to be a part of this patient's care.  Renold Genta, PharmD, BCPS Clinical Pharmacist Phone for tonight - [place] - [phone] 03/15/2016 1:00 PM

## 2016-03-15 NOTE — Consult Note (Signed)
   [facility] CM Inpatient Consult   03/15/2016  Gevorg Wayne Betley 05/13/1941 [id number]    Patient evaluated for community based chronic disease management services with [facility] Care Management Program as a benefit of patient's [employer]  Medicare Insurance for HX of re-admission.   Chart H & P reveals Taten Wayne Feickis a 75 y.o.malewith medical history significant Hx of Prior CVA, Left CEA, Craniotomy for Meningioma, HTN, HLD, CKD Stage 3 who was recently hospitalizatied October 21 to 23rd, for new-onset atrial fibrillation in the setting of sepsis, due to urinary tract infection. The day he was discharged he noticed left knee pain, the pain has been sharp in nature, worsening in intensity up to a 10 out of 10, no radiation, associated with local erythema, tenderness, decreased ambulation. Denies any associated fevers or chills. Patient has not been on diuretics, no history of gout. Patient was seen by his regular physician, noted left knee erythema, sent to orthopedics for suspected infected joint, left knee was intervened with arthrocentesis, the fluid was reported to be clear,he was sent to the hospital for further evaluation treatment for cellulitis of the Left Knee.   Met with patient and his wife Kathryn at bedside to explain [facility] Care Management services. Consent form signed.   Patient will receive post hospital discharge call and will be evaluated for monthly home visits for assessments and disease process education.  Left contact information and [facility] literature with wife. Made Inpatient Case Manager aware that [facility] Care Management following. Of note, [facility] Care Management services does not replace or interfere with any services that are arranged by inpatient case management or social work.  For additional questions or referrals please contact:     , RN BSN CCM [facility] Hospital Liaison  [phone] business mobile phone Toll free office [phone]      

## 2016-03-15 NOTE — Progress Notes (Signed)
ANTICOAGULATION CONSULT NOTE -[name] for warfarin Indication: atrial fibrillation, history of stroke  Allergies  Allergen Reactions  . Asa [Aspirin]     Severe skin bruising   . Immune Globulins Other (See Comments)    Tetanus Immunes Globulins    (  Pt. Cannot take the Flu shot ) [name]     Patient Measurements: Weight: 232 lb 5.8 oz (105.4 kg) (in bed, knee is painful to place weight on)  Vital Signs: Temp: 98.2 F (36.8 C) (10/30 0537) Temp Source: Oral (10/30 0537) BP: 131/52 (10/30 1016) Pulse Rate: 57 (10/30 1022)  Labs:  Recent Labs  03/13/16 0423 03/14/16 0545 03/15/16 0526 03/15/16 0855  HGB 11.7* 11.5*  --  12.0*  HCT 36.4* 36.7*  --  37.8*  PLT 242 261  --  284  LABPROT 19.9* 23.5* 25.0*  --   INR 1.67 2.06 2.22  --   CREATININE 1.60* 1.57*  --  1.39*    Estimated Creatinine Clearance: 55.9 mL/min (by C-G formula based on SCr of 1.39 mg/dL (H)).  Assessment: 4 yom presented to the hospital with knee pain. He is on chronic Coumadin for history of Afib and stroke. INR is therapeutic this morning at 2.22. No bleeding noted, Hgb and platelets are stable.  PTA dose: 5 mg daily   Goal of Therapy:  INR 2-3 Monitor platelets by anticoagulation protocol: Yes   Plan:  - Warfarin 5 mg PO qpm - Daily INR - Monitor for s/sx of bleeding   Renold Genta, PharmD, BCPS Clinical Pharmacist Phone for today - [place] - [phone] 03/15/2016 1:00 PM

## 2016-03-15 NOTE — Progress Notes (Signed)
RN noticed that indwelling catheter was not draining a lot of urine. Pt was bladder scanned- residual 698cc. NP called to have foley flushed first. Awaiting any further orders

## 2016-03-15 NOTE — Progress Notes (Signed)
Patient ID: [name], male   DOB: May 23, 1940, 75 y.o.   MRN: [id number] Subjective: Patient has been out of bed to go to the bathroom is able ambulate without too much difficulty and can do a straight leg raise. He reports that the pain in his left thigh has diminished. He denies any posterior pain in the knee. There is still some redness and erythema over the knee. The synovial aspiration cell count Gram stain and culture that was done in my office on 03/12/16 came back with 380 cells almost all her monos. Gram stain was negative.  Objective: The patient still has an area of erythema that is now pretty much settled over the patella and the subcutaneous tissue just above the patella about 6 cm in diameter. There is no palpable fluid collection in the subcutaneous tissue. MRI scan has been accomplished showing no abscess there was a effusion in the knee and a posterior horn medial meniscal tear that by history sounds like it is asymptomatic. He is asymptomatic along the posterior medial joint line, range of motion today is from 10-95.  Assessment: Subcutaneous cellulitis in the prepatellar region of the left knee and distal thigh.  Plan: Continue IV antibiotics per internal medicine. Check cultures that were done in our office on the 27th. Will follow. He is weightbearing as tolerated with the use of a walker.

## 2016-03-15 NOTE — Progress Notes (Signed)
Per telemetry patient had a few beats with a wide QRS complex. Dr. Chana Bode paged and night shift RN notified. [name]

## 2016-03-15 NOTE — Evaluation (Signed)
Physical Therapy Evaluation Patient Details Name: [name] MRN: [id number] DOB: 04-Dec-1940 Today's Date: 03/15/2016   History of Present Illness  [name] is a 75 y.o. male with medical history significant Hx of Prior CVA, Left CEA, Craniotomy for Meningioma, HTN, HLD, CKD Stage 3 who was recently hospitalizatied October 21 to 23rd, for new-onset atrial fibrillation in the setting of sepsis, due to urinary tract infection. The day he was discharged he noticed left knee pain, the pain has been sharp in nature, worsening in intensity up to a 10 out of 10,  Clinical Impression  Pt admitted with above diagnosis. Pt currently with functional limitations due to the deficits listed below (see PT Problem List).  Pt will benefit from skilled PT to increase their independence and safety with mobility to allow discharge to the venue listed below.  Pt's pain level is better than it was in PT session during last hospital stay, but not able to walk as far due to overall fatigue.  At this time recommend HHPT, but will continue to assess.     Follow Up Recommendations Home health PT    Equipment Recommendations  None recommended by PT    Recommendations for Other Services       Precautions / Restrictions Precautions Precautions: None Restrictions Weight Bearing Restrictions: No      Mobility  Bed Mobility Overal bed mobility: Needs Assistance Bed Mobility: Supine to Sit     Supine to sit: Min guard;HOB elevated     General bed mobility comments: MIN/guard, but with cues to use rail not PT.  Heavy use of rail.  Transfers Overall transfer level: Needs assistance Equipment used: Rolling walker (2 wheeled) Transfers: Sit to/from Stand Sit to Stand: Min assist;From elevated surface         General transfer comment: MIN A to power up from elevated surface  Ambulation/Gait Ambulation/Gait assistance: Min assist Ambulation Distance (Feet): 35 Feet Assistive device:  Rolling walker (2 wheeled) Gait Pattern/deviations: Step-through pattern;Decreased step length - right;Decreased step length - left;Antalgic     General Gait Details: 4/10 L knee pain with decreased knee flexion.  He also needed cueing > 75% of the time on proper sequencing  Stairs            Wheelchair Mobility    Modified Rankin (Stroke Patients Only)       Balance Overall balance assessment: Needs assistance   Sitting balance-Leahy Scale: Good       Standing balance-Leahy Scale: Poor Standing balance comment: needs UE support for pain management                             Pertinent Vitals/Pain Pain Assessment: 0-10 Pain Score: 4  Pain Location: L knee with walking Pain Descriptors / Indicators: Sore Pain Intervention(s): Limited activity within patient's tolerance;Monitored during session    Home Living Family/patient expects to be discharged to:: Private residence Living Arrangements: Spouse/significant other Available Help at Discharge: Family Type of Home: House Home Access: Stairs to enter   Technical brewer of Steps: 2 Home Layout: Two level;Able to live on main level with bedroom/bathroom Home Equipment: Bedside commode Additional Comments: can borrow walker    Prior Function Level of Independence: Independent         Comments: Son lives across the street and has been helping him with stairs since knee has been bothering him.     Hand Dominance  Extremity/Trunk Assessment   Upper Extremity Assessment: Overall WFL for tasks assessed               LLE Deficits / Details: no redness noted and pt able to perform quad set and use functionally although tends to keep extended   Cervical / Trunk Assessment: Normal  Communication   Communication: No difficulties  Cognition Arousal/Alertness: Awake/alert Behavior During Therapy: WFL for tasks assessed/performed Overall Cognitive Status: Within Functional Limits  for tasks assessed                      General Comments      Exercises General Exercises - Lower Extremity Quad Sets: Strengthening;Left;5 reps;Supine   Assessment/Plan    PT Assessment Patient needs continued PT services  PT Problem List Decreased strength;Decreased activity tolerance;Decreased balance;Decreased knowledge of use of DME;Decreased mobility;Pain          PT Treatment Interventions DME instruction;Gait training;Stair training;Functional mobility training;Therapeutic activities;Therapeutic exercise;Patient/family education    PT Goals (Current goals can be found in the Care Plan section)  Acute Rehab PT Goals Patient Stated Goal: home PT Goal Formulation: With patient Time For Goal Achievement: 03/29/16 Potential to Achieve Goals: Good    Frequency Min 3X/week   Barriers to discharge        Co-evaluation               End of Session Equipment Utilized During Treatment: Gait belt Activity Tolerance: Patient tolerated treatment well Patient left: in chair;with call bell/phone within reach Nurse Communication: Mobility status         Time: 7371-0626 PT Time Calculation (min) (ACUTE ONLY): 31 min   Charges:   PT Evaluation $PT Eval Moderate Complexity: 1 Procedure PT Treatments $Gait Training: 8-22 mins   PT G Codes:        Dillyn Joaquin LUBECK 03/15/2016, 12:27 PM

## 2016-03-15 NOTE — Progress Notes (Signed)
PROGRESS NOTE    [name]  KXF:[id number] DOB: 06-28-1940 DOA: 03/12/2016 PCP: Irven Shelling, MD   Brief Narrative:  [name] is a 75 y.o. male with medical history significant Hx of Prior CVA, Left CEA, Craniotomy for Meningioma, HTN, HLD, CKD Stage 3 who was recently hospitalizatied October 21 to 23rd, for new-onset atrial fibrillation in the setting of sepsis, due to urinary tract infection. The day he was discharged he noticed left knee pain, the pain has been sharp in nature, worsening in intensity up to a 10 out of 10, no radiation, associated with local erythema, tenderness, decreased ambulation. Denies any associated fevers or chills. Patient has not been on diuretics, no history of gout. Patient was seen yesterday by his regular physician, noted left knee erythema, sent to orthopedics for suspected infected joint, left knee was intervened with arthrocentesis, the fluid was reported to be clear, he was sent to the hospital for further evaluation treatment for cellulitis of the Left Knee.   Assessment & Plan:   Active Problems:   Atrial fibrillation, new onset ([place])   Renal insufficiency   Atrial fibrillation ([facility])   Acute urinary retention   Cellulitis of left knee  1. Left Knee Cellulitis., improving clinically -WBC went from 13.3 -> 14.4 -> 13.9 -> 14.1 -Procalcitonin was 7.20 on admission likely from Infection -C/w Broad-Spectrum antibiotics including Vancomycin and Zosyn dosing per Pharmacy; Will likely narrow therapy tomorrow  -Outpatient Arthrocentesis showed 380 Total Nucleated Cells and 78%; Gram Stain showed no WBC -Will Request Records from the Orthopedic Clinic.  -C/w Gentle hydration with normal saline 75 mL per hour -Morphone 1 mg IV q4hprn for Pain Control and Zofran 4 mg po and IV for N/V -Orthopedics Following and Ordered MRI of Knee and above Knee to R/o Abscess -MRI showed Nonspecific subcutaneous edema around the knee, most  prominent medially. Slight edema in the distal quadriceps muscle, nonspecific. No discrete abscesses. Moderate joint effusion. Moderate osteoarthritic changes of the medial compartment with a complex tear of the midbody and posterior horn of the medial Meniscus. -Blood Cx showed No Growth at 1 day x 2 -Knee less warm and erythematous -Repeat CBC in AM  2. Atrial Fibrillation.  -Continue Rate control with Diltiazem 120 mg po daily and Metoprolol 50 mg po BID. -Anticoagulation with Warfarin as INR is now Therapeutic. -PT/INR was 19.9/1.67 -> 23.5/2.06 -> 25.0/2.22 -D/C'd Lovenox Full Dose today as INR is therapeutic >24 hours -C/w Warfarin Dosing per Pharmacy  3. BPH/ Urinary Obstruction.  -Will continue Flomax 0.4 -Patient has been diagnosed with Urinary Retention,  -Continue Foley catheter until seen by Urologist at next scheduled Appointment  4. Hypokalemia, improved -Patient's Potassium was 3.4 -> 3.8 -> 3.8 -Repeat BMP in AM  5. Hypertension -BP ranged from 117-134 SBP and 42-57 DBP -C/w Metoprolol and Diltiazem  6. Dyslipidemia.  -Patient had Elevated LFTs and trending down now.  -Continue Rosuvastatin 40 mg po Daily.  7. Chronic kidney disease stage III.  -EGFR was 56 today -Patient's BUN/Cr went from 38/2.06 -> 19/1.60 -> 15/1.57 -> 12/1.39 -C/w IVF at 75 mL/hr -Avoid Nephrotoxic Medications  8. Hx of Meningioma -No active Issues  9. Hx of CVA and Left CEA -Continue Rosuvastatin 40 mg -Patient has an Aspirin Allergy  DVT prophylaxis: Coumadin Code Status: Full Family Communication: No Family at Bedside Disposition Plan: Home with Home Health   Consultants:   Orthopedics  Procedures: MRI of the Knee  Antimicrobials:  IV Vancomycin 03/12/16 --->  IV Zosyn 03/12/16 --->  Subjective: Seen and examined at bedside and stated he could move his knee more. It was still slightly warm but the erythema was resolving. No CP/SOB or N/V/Abdominal pain. No other  complaints or concerns.   Objective: Vitals:   03/15/16 0537 03/15/16 1016 03/15/16 1022 03/15/16 1258  BP: (!) 138/54 (!) 131/52  (!) 142/53  Pulse: 60  (!) 57 (!) 55  Resp:    19  Temp: 98.2 F (36.8 C)   97.8 F (36.6 C)  TempSrc: Oral   Oral  SpO2: 93%   97%  Weight:        Intake/Output Summary (Last 24 hours) at 03/15/16 1424 Last data filed at 03/15/16 1300  Gross per 24 hour  Intake            787.5 ml  Output             1350 ml  Net           -562.5 ml   Filed Weights   03/13/16 0301 03/14/16 0246 03/15/16 0500  Weight: 106 kg (233 lb 11 oz) 104 kg (229 lb 4.5 oz) 105.4 kg (232 lb 5.8 oz)    Examination: Physical Exam:  Constitutional: WN/WD, obese gentleman in NAD and appears calm and comfortable Eyes: Lids and conjunctivae normal, sclerae anicteric  ENMT: External Ears, Nose appear normal. Grossly normal hearing.  Neck: Appears normal, supple, no cervical masses, normal ROM, no appreciable thyromegaly, no Appreciable JVD Respiratory: Clear to auscultation bilaterally, no wheezing, rales, rhonchi or crackles. Normal respiratory effort and patient is not tachypenic. No accessory muscle use.  Cardiovascular: Irregularly Irregular, no murmurs / rubs / gallops. S1 and S2 auscultated. No extremity edema. Abdomen: Soft, non-tender, non-distended. No masses palpated. No appreciable hepatosplenomegaly. Bowel sounds positive.  GU: Deferred. Indwelling Foley making dark urine.  Musculoskeletal: No clubbing / cyanosis of digits/nails. Swollen left Knee with mild erythema and warmth Skin: No rashes, lesions, ulcers.  Neurologic: CN 2-12 grossly intact with no focal deficits. Sensation intact in all 4 Extremities. Romberg sign cerebellar reflexes not assessed.  Psychiatric: Normal judgment and insight. Alert and oriented x 3. Normal mood and appropriate affect.   Data Reviewed: I have personally reviewed following labs and imaging studies  CBC:  Recent Labs Lab  03/13/16 0423 03/14/16 0545 03/15/16 0855  WBC 14.4* 13.9* 14.1*  NEUTROABS  --  10.8* 11.1*  HGB 11.7* 11.5* 12.0*  HCT 36.4* 36.7* 37.8*  MCV 90.3 92.4 91.7  PLT 242 261 518   Basic Metabolic Panel:  Recent Labs Lab 03/13/16 0423 03/14/16 0545 03/15/16 0855  NA 143 144 145  K 3.4* 3.8 3.8  CL 114* 115* 117*  CO2 23 24 25   GLUCOSE 128* 111* 99  BUN 19 15 12   CREATININE 1.60* 1.57* 1.39*  CALCIUM 7.7* 7.9* 8.2*  MG  --  2.0 1.9  PHOS  --  3.5 3.3   GFR: Estimated Creatinine Clearance: 55.9 mL/min (by C-G formula based on SCr of 1.39 mg/dL (H)). Liver Function Tests:  Recent Labs Lab 03/13/16 0423 03/14/16 0545 03/15/16 0855  AST 44* 123* 70*  ALT 46 101* 90*  ALKPHOS 89 95 87  BILITOT 0.7 0.4 0.6  PROT 4.8* 4.9* 5.2*  ALBUMIN 2.0* 1.9* 2.1*   No results for input(s): LIPASE, AMYLASE in the last 168 hours. No results for input(s): AMMONIA in the last 168 hours. Coagulation Profile:  Recent Labs Lab 03/12/16 1819 03/13/16 0423 03/14/16 8416  03/15/16 0526  INR 1.45 1.67 2.06 2.22   Cardiac Enzymes: No results for input(s): CKTOTAL, CKMB, CKMBINDEX, TROPONINI in the last 168 hours. BNP (last 3 results) No results for input(s): PROBNP in the last 8760 hours. HbA1C: No results for input(s): HGBA1C in the last 72 hours. CBG: No results for input(s): GLUCAP in the last 168 hours. Lipid Profile: No results for input(s): CHOL, HDL, LDLCALC, TRIG, CHOLHDL, LDLDIRECT in the last 72 hours. Thyroid Function Tests: No results for input(s): TSH, T4TOTAL, FREET4, T3FREE, THYROIDAB in the last 72 hours. Anemia Panel: No results for input(s): VITAMINB12, FOLATE, FERRITIN, TIBC, IRON, RETICCTPCT in the last 72 hours. Sepsis Labs: No results for input(s): PROCALCITON, LATICACIDVEN in the last 168 hours.  Recent Results (from the past 240 hour(s))  Urine culture     Status: None   Collection Time: 03/06/16  7:50 PM  Result Value Ref Range Status   Specimen  Description URINE, CLEAN CATCH  Final   Special Requests NONE  Final   Culture NO GROWTH Performed at [facility]   Final   Report Status 03/08/2016 FINAL  Final  Blood Culture (routine x 2)     Status: None   Collection Time: 03/06/16  8:23 PM  Result Value Ref Range Status   Specimen Description BLOOD R WRIST  Final   Special Requests BOTTLES DRAWN AEROBIC AND ANAEROBIC 5ML EA  Final   Culture   Final    NO GROWTH 5 DAYS Performed at [facility]    Report Status 03/12/2016 FINAL  Final  Blood Culture (routine x 2)     Status: None   Collection Time: 03/06/16  9:04 PM  Result Value Ref Range Status   Specimen Description BLOOD LEFT ANTECUBITAL  Final   Special Requests BOTTLES DRAWN AEROBIC AND ANAEROBIC [place] EA  Final   Culture   Final    NO GROWTH 5 DAYS Performed at [facility]    Report Status 03/12/2016 FINAL  Final  Culture, blood (Routine X 2) w Reflex to ID Panel     Status: None (Preliminary result)   Collection Time: 03/13/16  4:25 AM  Result Value Ref Range Status   Specimen Description BLOOD RIGHT ARM  Final   Special Requests BOTTLES DRAWN AEROBIC AND ANAEROBIC 5ML  Final   Culture NO GROWTH 1 DAY  Final   Report Status PENDING  Incomplete  Culture, blood (Routine X 2) w Reflex to ID Panel     Status: None (Preliminary result)   Collection Time: 03/13/16  4:47 AM  Result Value Ref Range Status   Specimen Description BLOOD RIGHT HAND  Final   Special Requests BOTTLES DRAWN AEROBIC AND ANAEROBIC 5ML  Final   Culture NO GROWTH 1 DAY  Final   Report Status PENDING  Incomplete    Radiology Studies: Mr Knee Left Wo Contrast  Result Date: 03/14/2016 CLINICAL DATA:  Acute onset of soft tissue swelling and pain 5 days ago. Cellulitis of the left knee. EXAM: MRI OF THE LEFT KNEE WITHOUT CONTRAST TECHNIQUE: Multiplanar, multisequence MR imaging of the knee was performed. No intravenous contrast was administered. COMPARISON:  None. FINDINGS: Soft  tissues: There is prominent subcutaneous edema around the knee particularly laterally superficial to the lateral patellofemoral ligament. No definable abscess. Slight edema in the distal quadriceps muscles. No definable abscess. Small longitudinal fluid collection in the superficial layer of the distal quadriceps tendon, nonspecific. MENISCI Medial meniscus: There is a complex tear involving the  midbody and posterior horn. There is very little remnant of the midbody visible on image 15 of series 9. Lateral meniscus:  Intact. LIGAMENTS Cruciates:  Normal. Collaterals:  Normal. CARTILAGE Patellofemoral:  Minimal chondromalacia patella. Medial: Full-thickness cartilage loss of the central portions of the femoral condyle and tibial plateau. Marked joint space narrowing. Lateral:  Normal. Joint:  Moderate joint effusion. Popliteal Fossa:  Very tiny Baker's cyst. Extensor Mechanism: Degeneration of the distal quadriceps tendon with what is probably a intra tendinous ganglion cyst in the distal quadriceps tendon anteriorly. Bones:  Small tricompartmental marginal osteophytes. IMPRESSION: 1. Nonspecific subcutaneous edema around the knee, most prominent medially. Slight edema in the distal quadriceps muscle, nonspecific. No discrete abscesses. 2. Moderate joint effusion. 3. Moderate osteoarthritic changes of the medial compartment with a complex tear of the midbody and posterior horn of the medial meniscus. Electronically Signed   By: Lorriane Shire M.D.   On: 03/14/2016 16:31   Scheduled Meds: . cholecalciferol  1,000 Units Oral Daily  . diltiazem  120 mg Oral Daily  . metoprolol  50 mg Oral BID  . piperacillin-tazobactam (ZOSYN)  IV  3.375 g Intravenous Q8H  . rosuvastatin  40 mg Oral Daily  . sodium chloride flush  3 mL Intravenous Q12H  . sodium chloride flush  3 mL Intravenous Q12H  . tamsulosin  0.4 mg Oral QPC supper  . vancomycin  1,250 mg Intravenous Q24H  . vitamin B-12  1,000 mcg Oral Once per day on  Mon Thu  . warfarin  5 mg Oral q1800  . Warfarin - Pharmacist Dosing Inpatient   Does not apply q1800   Continuous Infusions: . sodium chloride 75 mL/hr at 03/15/16 0524    LOS: 3 days   Kerney Elbe, DO [facility] Pager [phone]  If 7PM-7AM, please contact night-coverage [email or web address] Password [email or web address] 03/15/2016, 2:24 PM

## 2016-03-16 DIAGNOSIS — N289 Disorder of kidney and ureter, unspecified: Secondary | ICD-10-CM | POA: Diagnosis not present

## 2016-03-16 DIAGNOSIS — I4891 Unspecified atrial fibrillation: Secondary | ICD-10-CM | POA: Diagnosis not present

## 2016-03-16 DIAGNOSIS — L03116 Cellulitis of left lower limb: Secondary | ICD-10-CM | POA: Diagnosis not present

## 2016-03-16 DIAGNOSIS — R338 Other retention of urine: Secondary | ICD-10-CM | POA: Diagnosis not present

## 2016-03-16 LAB — CBC WITH DIFFERENTIAL/PLATELET
Basophils Absolute: 0.1 10*3/uL (ref 0.0–0.1)
Basophils Relative: 0 %
EOS PCT: 2 %
Eosinophils Absolute: 0.2 10*3/uL (ref 0.0–0.7)
HEMATOCRIT: 38 % — AB (ref 39.0–52.0)
Hemoglobin: 11.8 g/dL — ABNORMAL LOW (ref 13.0–17.0)
LYMPHS PCT: 13 %
Lymphs Abs: 1.5 10*3/uL (ref 0.7–4.0)
MCH: 29 pg (ref 26.0–34.0)
MCHC: 31.1 g/dL (ref 30.0–36.0)
MCV: 93.4 fL (ref 78.0–100.0)
MONO ABS: 0.9 10*3/uL (ref 0.1–1.0)
MONOS PCT: 7 %
NEUTROS ABS: 9.1 10*3/uL — AB (ref 1.7–7.7)
Neutrophils Relative %: 78 %
PLATELETS: 265 10*3/uL (ref 150–400)
RBC: 4.07 MIL/uL — ABNORMAL LOW (ref 4.22–5.81)
RDW: 14.8 % (ref 11.5–15.5)
WBC: 11.8 10*3/uL — ABNORMAL HIGH (ref 4.0–10.5)

## 2016-03-16 LAB — COMPREHENSIVE METABOLIC PANEL
ALT: 77 U/L — ABNORMAL HIGH (ref 17–63)
ANION GAP: 5 (ref 5–15)
AST: 52 U/L — ABNORMAL HIGH (ref 15–41)
Albumin: 2 g/dL — ABNORMAL LOW (ref 3.5–5.0)
Alkaline Phosphatase: 79 U/L (ref 38–126)
BILIRUBIN TOTAL: 0.4 mg/dL (ref 0.3–1.2)
BUN: 12 mg/dL (ref 6–20)
CO2: 24 mmol/L (ref 22–32)
Calcium: 8.1 mg/dL — ABNORMAL LOW (ref 8.9–10.3)
Chloride: 113 mmol/L — ABNORMAL HIGH (ref 101–111)
Creatinine, Ser: 1.46 mg/dL — ABNORMAL HIGH (ref 0.61–1.24)
GFR, EST AFRICAN AMERICAN: 52 mL/min — AB (ref >60)
GFR, EST NON AFRICAN AMERICAN: 45 mL/min — AB (ref >60)
Glucose, Bld: 112 mg/dL — ABNORMAL HIGH (ref 65–99)
POTASSIUM: 4.1 mmol/L (ref 3.5–5.1)
Sodium: 142 mmol/L (ref 135–145)
TOTAL PROTEIN: 5.1 g/dL — AB (ref 6.5–8.1)

## 2016-03-16 LAB — PROTIME-INR
INR: 2.23
PROTHROMBIN TIME: 25.1 s — AB (ref 11.4–15.2)

## 2016-03-16 LAB — PHOSPHORUS: PHOSPHORUS: 3.9 mg/dL (ref 2.5–4.6)

## 2016-03-16 LAB — MAGNESIUM: MAGNESIUM: 1.7 mg/dL (ref 1.7–2.4)

## 2016-03-16 MED ORDER — DOXYCYCLINE HYCLATE 100 MG PO TABS
100.0000 mg | ORAL_TABLET | Freq: Two times a day (BID) | ORAL | Status: DC
  Administered 2016-03-16: 100 mg via ORAL
  Filled 2016-03-16: qty 1

## 2016-03-16 MED ORDER — DOXYCYCLINE HYCLATE 100 MG PO TABS: 100.0000 mg | ORAL_TABLET | Freq: Two times a day (BID) | ORAL | 0 refills | Status: DC

## 2016-03-16 MED ORDER — WARFARIN - PHYSICIAN DOSING INPATIENT
Freq: Every day | Status: DC
  Administered 2016-03-16: 17:00:00

## 2016-03-16 NOTE — Care Management Note (Addendum)
Case Management Note  Patient Details  Name: [name] MRN: [id number] Date of Birth: 22-Feb-1941  Subjective/Objective:            Admitted with new onset a.fib. Hx of Prior CVA, Left CEA, Craniotomy for Meningioma, HTN, HLD, CKD Stage 3.     Action/Plan: Plan is to d/c to home today with home health services.  Expected Discharge Date:    03/16/2016           Expected Discharge Plan:     In-House Referral:     Discharge planning Services     Post Acute Care Choice:    Choice offered to:   PATIENT  DME Arranged:    DME Agency:     [place] Arranged:   RN,PT/ referral made with Butch Penny @ [phone] by CM. [place] Agency:   [facility]  Status of Service:     If discussed at [place] of Stay Meetings, dates discussed:    Additional Comments:  Sharin Mons, RN 03/16/2016, 3:06 PM

## 2016-03-16 NOTE — Discharge Summary (Signed)
Physician Discharge Summary  [name] IDP:[id number] DOB: 03/12/1941 DOA: 03/12/2016  PCP: Irven Shelling, MD  Admit date: 03/12/2016 Discharge date: 03/16/2016  Admitted From: Home Disposition:  Home with Home Health  Recommendations for Outpatient Follow-up:  1. Follow up with PCP in 1-2 weeks 2. Follow up with Orthopedics in 1 week 3. Please obtain BMP/CBC in one week  Home Health: YES Equipment/Devices: None recommended by PT  Discharge Condition: Stable CODE STATUS: FULL Diet recommendation: Heart Healthy   Brief/Interim Summary: [name] a 75 y.o.malewith medical history significant Hx of Prior CVA, Left CEA, Craniotomy for Meningioma, HTN, HLD, CKD Stage 3 who was recently hospitalizatied October 21 to 23rd, for new-onset atrial fibrillation in the setting of sepsis, due to urinary tract infection. The day he was discharged he noticed left knee pain, the pain has been sharp in nature, worsening in intensity up to a 10 out of 10, no radiation, associated with local erythema, tenderness, decreased ambulation. Denied any associated fevers or chills. Patient has not been on diuretics, no history of gout. Patient was seen yesterday by his regular physician, noted left knee erythema, sent to orthopedics for suspected infected joint, left knee was intervened with arthrocentesis, the fluid was reported to be clear,he was sent to the hospital for further evaluation treatment for cellulitis of the Left Knee. He was placed on IV Abx and improved and eventually transitioned to po Abx. Physical Therapy was consulted and they recommedned Home PT at Discharge. Patient medically stable at this point to be D/C'd home.   Discharge Diagnoses:  Active Problems:   Atrial fibrillation, new onset ([place])   Renal insufficiency   Atrial fibrillation (HCC)   Acute urinary retention   Cellulitis of left knee  1. Left Knee Cellulitis., improving clinically -WBC went from  13.3 -> 14.4 -> 13.9 -> 14.1 -> 11.8 -Procalcitonin was 7.20 on admission likely from Infection -Was onBroad-Spectrum antibiotics including Vancomycin and Zosyn dosing per Pharmacy; Now on Doxycycline 100 mg po BID for 12 more doses -Outpatient Arthrocentesis showed 380 Total Nucleated Cells and 78%; Gram Stain showed no WBC -Will Request Records from the Orthopedic Clinic.  -Was on Gentle hydration with normal saline 75 mL per hour -Morphone 1 mg IV q4hprn for Pain Control and Zofran 4 mg po and IV for N/V -Orthopedics Following and Ordered MRI of Knee and above Knee to R/o Abscess -MRI showed Nonspecific subcutaneous edema around the knee, most prominent medially. Slight edema in the distal quadriceps muscle, nonspecific. No discrete abscesses. Moderate joint effusion. Moderate osteoarthritic changes of the medial compartment with a complex tear of the midbody and posterior horn of the medial Meniscus. -Blood Cx showed No Growth at 3 day x 2 -Knee less warm and erythematous -Follow up with Orthopedics as an outpatient and Repeat CBC at PCP  2. Atrial Fibrillation.  -Continue Rate control with Diltiazem 120 mg po daily andMetoprolol 50 mg po BID. -Anticoagulation with Warfarin as INR is now Therapeutic. -PT/INR was 19.9/1.67 -> 23.5/2.06 -> 25.0/2.22 -> 25.1/2.23 -Continue Home Warfarin  3. BPH/ Urinary Obstruction.  -Will continue Flomax 0.4 -Patient has been diagnosed with Urinary Retention,  -Continue Foley catheter until seen by Urologist at next scheduled Appointment  4. Hypokalemia, improved -Patient's Potassium was 3.4 -> 3.8 -> 4.1 -Repeat BMP at PCP's office  5. Hypertension -C/w Metoprolol and Diltiazem  6. Dyslipidemia.  -Patient had Elevated LFTs and trending down now.  -Continue Rosuvastatin 40 mg po Daily.  7. Chronic kidney  disease stage III.  -EGFR was 56 today -Patient's BUN/Cr went from 38/2.06 -> 19/1.60 -> 15/1.57 -> 12/1.39 -> 12/1.46 -Avoid  Nephrotoxic Medications  8. Hx of Meningioma -No active Issues  9. Hx of CVA and Left CEA -Continue Rosuvastatin 40 mg -Patient has an Aspirin Allergy  Discharge Instructions  Discharge Instructions    AMB Referral to [facility]    Complete by:  As directed    Reason for consult:  Follow up on monitoring for new dx and resource needs - HTA patient and re-admission   Expected date of contact:  1-3 days (reserved for hospital discharges)   Please assign to community nurse for transition of care calls and assess for home visits. Questions please call:   Natividad Brood, RN BSN [facility] Liaison  [phone] business mobile phone Toll free office [phone]   Call MD for:  difficulty breathing, headache or visual disturbances    Complete by:  As directed    Call MD for:  persistant nausea and vomiting    Complete by:  As directed    Call MD for:  severe uncontrolled pain    Complete by:  As directed    Diet - low sodium heart healthy    Complete by:  As directed    Increase activity slowly    Complete by:  As directed        Medication List    STOP taking these medications   ciprofloxacin 500 MG tablet Commonly known as:  CIPRO   enoxaparin 150 MG/ML injection Commonly known as:  LOVENOX     TAKE these medications   cholecalciferol 1000 units tablet Commonly known as:  VITAMIN D Take 1,000 Units by mouth daily.   CRESTOR 40 MG tablet Generic drug:  rosuvastatin Take 40 mg by mouth daily.   diltiazem 120 MG 24 hr capsule Commonly known as:  CARDIZEM CD Take 1 capsule (120 mg total) by mouth daily.   doxycycline 100 MG tablet Commonly known as:  VIBRA-TABS Take 1 tablet (100 mg total) by mouth every 12 (twelve) hours.   metoprolol 50 MG tablet Commonly known as:  LOPRESSOR Take 1 tablet (50 mg total) by mouth 2 (two) times daily.   tamsulosin 0.4 MG Caps capsule Commonly known as:  FLOMAX Take 1 capsule (0.4 mg total) by  mouth daily after supper.   vitamin B-12 1000 MCG tablet Commonly known as:  CYANOCOBALAMIN Take 1,000 mcg by mouth 2 (two) times a week.   warfarin 5 MG tablet Commonly known as:  COUMADIN Take 1 tablet (5 mg total) by mouth daily at 6 PM.      Follow-up Information    [facility] .   Why:  Home health RN, PT arranged Contact information: [place] [address] [phone]        Kerin Salen, MD. Call today.   Specialty:  Orthopedic Surgery Why:  Call to scheudule and appointment Contact information: [place] [address] [phone]        Irven Shelling, MD Follow up in 1 week(s).   Specialty:  Internal Medicine Contact information: [address] [place] [address] [phone]          Allergies  Allergen Reactions  . Asa [Aspirin]     Severe skin bruising   . Immune Globulins Other (See Comments)    Tetanus Immunes Globulins    (  Pt. Cannot take  the Flu shot ) [name]     Consultations: Orthopedics   Procedures/Studies: US Renal  Result Date: 03/07/2016 CLINICAL DATA:  Acute kidney injury, hypertension, hyperlipidemia EXAM: RENAL / URINARY TRACT ULTRASOUND COMPLETE COMPARISON:  None available FINDINGS: Right Kidney: Length: 11.4 cm. Mild increased echogenicity. No hydronephrosis. Right upper pole exophytic hypoechoic cyst measures 2 x 2 x 2 cm. Additional medial upper pole exophytic cyst measures 3.8 x 3.0 x 3.2 cm. Left Kidney: Length: 13.3 cm. Slight increased echogenicity. No obstruction or hydronephrosis. Upper pole hypoechoic exophytic cyst measures 2.5 x 2.9 x 3.4 cm. Bladder: Decompressed by Foley catheter. IMPRESSION: No acute finding or hydronephrosis. Bilateral renal cysts as above. Bladder is decompressed by Foley. Electronically Signed   By: Jerilynn Mages.  Shick M.D.   On: 03/07/2016 12:39   Mr Knee Left Wo Contrast  Result Date: 03/14/2016 CLINICAL DATA:   Acute onset of soft tissue swelling and pain 5 days ago. Cellulitis of the left knee. EXAM: MRI OF THE LEFT KNEE WITHOUT CONTRAST TECHNIQUE: Multiplanar, multisequence MR imaging of the knee was performed. No intravenous contrast was administered. COMPARISON:  None. FINDINGS: Soft tissues: There is prominent subcutaneous edema around the knee particularly laterally superficial to the lateral patellofemoral ligament. No definable abscess. Slight edema in the distal quadriceps muscles. No definable abscess. Small longitudinal fluid collection in the superficial layer of the distal quadriceps tendon, nonspecific. MENISCI Medial meniscus: There is a complex tear involving the midbody and posterior horn. There is very little remnant of the midbody visible on image 15 of series 9. Lateral meniscus:  Intact. LIGAMENTS Cruciates:  Normal. Collaterals:  Normal. CARTILAGE Patellofemoral:  Minimal chondromalacia patella. Medial: Full-thickness cartilage loss of the central portions of the femoral condyle and tibial plateau. Marked joint space narrowing. Lateral:  Normal. Joint:  Moderate joint effusion. Popliteal Fossa:  Very tiny Baker's cyst. Extensor Mechanism: Degeneration of the distal quadriceps tendon with what is probably a intra tendinous ganglion cyst in the distal quadriceps tendon anteriorly. Bones:  Small tricompartmental marginal osteophytes. IMPRESSION: 1. Nonspecific subcutaneous edema around the knee, most prominent medially. Slight edema in the distal quadriceps muscle, nonspecific. No discrete abscesses. 2. Moderate joint effusion. 3. Moderate osteoarthritic changes of the medial compartment with a complex tear of the midbody and posterior horn of the medial meniscus. Electronically Signed   By: Lorriane Shire M.D.   On: 03/14/2016 16:31   Dg Chest Port 1 View  Result Date: 03/06/2016 CLINICAL DATA:  Lower abdominal pain starting Thursday EXAM: PORTABLE CHEST 1 VIEW COMPARISON:  None. FINDINGS:  Borderline cardiomegaly. Mild elevation of the right hemidiaphragm. No acute infiltrate or pleural effusion. No pulmonary edema. Degenerative changes bilateral shoulders. IMPRESSION: No active disease. Electronically Signed   By: Lahoma Crocker M.D.   On: 03/06/2016 20:22    Subjective: Seen and examined at bedside and was doing well. Had no active complaints and felt better. Stated he was using his knee more and it was less swollen, less erythematous, and looked improved. No other concerns or complaints at this time and is ready to go home.   Discharge Exam: Vitals:   03/16/16 0944 03/16/16 1445  BP: (!) 131/45 (!) 133/48  Pulse: (!) 59 (!) 57  Resp:  17  Temp:     Vitals:   03/15/16 2208 03/16/16 0441 03/16/16 0944 03/16/16 1445  BP: (!) 144/56 (!) 128/52 (!) 131/45 (!) 133/48  Pulse: 63 (!) 53 (!) 59 (!) 57  Resp: _0 Temp:  97.7 F (36.5 C) 98.1 F (36.7 C)    TempSrc: Oral Oral    SpO2: 99% 93%  97%  Weight:  105.7 kg (233 lb 0.4 oz)     General: Pt is alert, awake, not in acute distress Cardiovascular: RRR, S1/S2 +, no rubs, no gallops Respiratory: CTA bilaterally, no wheezing, no rhonchi Abdominal: Soft, NT, ND, bowel sounds + Extremities: no edema, no cyanosis; Mild warmth and erythema on Left knee  The results of significant diagnostics from this hospitalization (including imaging, microbiology, ancillary and laboratory) are listed below for reference.    Microbiology: Recent Results (from the past 240 hour(s))  Urine culture     Status: None   Collection Time: 03/06/16  7:50 PM  Result Value Ref Range Status   Specimen Description URINE, CLEAN CATCH  Final   Special Requests NONE  Final   Culture NO GROWTH Performed at [facility]   Final   Report Status 03/08/2016 FINAL  Final  Blood Culture (routine x 2)     Status: None   Collection Time: 03/06/16  8:23 PM  Result Value Ref Range Status   Specimen Description BLOOD R WRIST  Final   Special  Requests BOTTLES DRAWN AEROBIC AND ANAEROBIC 5ML EA  Final   Culture   Final    NO GROWTH 5 DAYS Performed at [facility]    Report Status 03/12/2016 FINAL  Final  Blood Culture (routine x 2)     Status: None   Collection Time: 03/06/16  9:04 PM  Result Value Ref Range Status   Specimen Description BLOOD LEFT ANTECUBITAL  Final   Special Requests BOTTLES DRAWN AEROBIC AND ANAEROBIC [place] EA  Final   Culture   Final    NO GROWTH 5 DAYS Performed at [facility]    Report Status 03/12/2016 FINAL  Final  Culture, blood (Routine X 2) w Reflex to ID Panel     Status: None (Preliminary result)   Collection Time: 03/13/16  4:25 AM  Result Value Ref Range Status   Specimen Description BLOOD RIGHT ARM  Final   Special Requests BOTTLES DRAWN AEROBIC AND ANAEROBIC 5ML  Final   Culture NO GROWTH 3 DAYS  Final   Report Status PENDING  Incomplete  Culture, blood (Routine X 2) w Reflex to ID Panel     Status: None (Preliminary result)   Collection Time: 03/13/16  4:47 AM  Result Value Ref Range Status   Specimen Description BLOOD RIGHT HAND  Final   Special Requests BOTTLES DRAWN AEROBIC AND ANAEROBIC 5ML  Final   Culture NO GROWTH 3 DAYS  Final   Report Status PENDING  Incomplete    Labs: BNP (last 3 results)  Recent Labs  03/07/16 0522  BNP 491.7*   Basic Metabolic Panel:  Recent Labs Lab 03/13/16 0423 03/14/16 0545 03/15/16 0855 03/16/16 0423  NA 143 144 145 142  K 3.4* 3.8 3.8 4.1  CL 114* 115* 117* 113*  CO2 _0 GLUCOSE 128* 111* 99 112*  BUN _1 CREATININE 1.60* 1.57* 1.39* 1.46*  CALCIUM 7.7* 7.9* 8.2* 8.1*  MG  --  2.0 1.9 1.7  PHOS  --  3.5 3.3 3.9   Liver Function Tests:  Recent Labs Lab 03/13/16 0423 03/14/16 0545 03/15/16 0855 03/16/16 0423  AST 44* 123* 70* 52*  ALT 46 101* 90* 77*  ALKPHOS 89 95 87 79  BILITOT 0.7 0.4 0.6 0.4  PROT 4.8* 4.9* 5.2* 5.1*  ALBUMIN 2.0* 1.9* 2.1* 2.0*   No results for input(s): LIPASE,  AMYLASE in the last 168 hours. No results for input(s): AMMONIA in the last 168 hours. CBC:  Recent Labs Lab 03/13/16 0423 03/14/16 0545 03/15/16 0855 03/16/16 0423  WBC 14.4* 13.9* 14.1* 11.8*  NEUTROABS  --  10.8* 11.1* 9.1*  HGB 11.7* 11.5* 12.0* 11.8*  HCT 36.4* 36.7* 37.8* 38.0*  MCV 90.3 92.4 91.7 93.4  PLT 242 261 284 265   Cardiac Enzymes: No results for input(s): CKTOTAL, CKMB, CKMBINDEX, TROPONINI in the last 168 hours. BNP: Invalid input(s): POCBNP CBG: No results for input(s): GLUCAP in the last 168 hours. D-Dimer No results for input(s): DDIMER in the last 72 hours. Hgb A1c No results for input(s): HGBA1C in the last 72 hours. Lipid Profile No results for input(s): CHOL, HDL, LDLCALC, TRIG, CHOLHDL, LDLDIRECT in the last 72 hours. Thyroid function studies No results for input(s): TSH, T4TOTAL, T3FREE, THYROIDAB in the last 72 hours.  Invalid input(s): FREET3 Anemia work up No results for input(s): VITAMINB12, FOLATE, FERRITIN, TIBC, IRON, RETICCTPCT in the last 72 hours. Urinalysis    Component Value Date/Time   COLORURINE YELLOW 03/06/2016 1950   APPEARANCEUR CLOUDY (A) 03/06/2016 1950   LABSPEC 1.015 03/06/2016 1950   PHURINE 5.5 03/06/2016 1950   GLUCOSEU NEGATIVE 03/06/2016 1950   HGBUR MODERATE (A) 03/06/2016 [employer] NEGATIVE 03/06/2016 [place] NEGATIVE 03/06/2016 1950   PROTEINUR 30 (A) 03/06/2016 1950   NITRITE NEGATIVE 03/06/2016 1950   LEUKOCYTESUR TRACE (A) 03/06/2016 1950   Sepsis Labs Invalid input(s): PROCALCITONIN,  WBC,  LACTICIDVEN Microbiology Recent Results (from the past 240 hour(s))  Urine culture     Status: None   Collection Time: 03/06/16  7:50 PM  Result Value Ref Range Status   Specimen Description URINE, CLEAN CATCH  Final   Special Requests NONE  Final   Culture NO GROWTH Performed at [facility]   Final   Report Status 03/08/2016 FINAL  Final  Blood Culture (routine x 2)     Status:  None   Collection Time: 03/06/16  8:23 PM  Result Value Ref Range Status   Specimen Description BLOOD R WRIST  Final   Special Requests BOTTLES DRAWN AEROBIC AND ANAEROBIC 5ML EA  Final   Culture   Final    NO GROWTH 5 DAYS Performed at [facility]    Report Status 03/12/2016 FINAL  Final  Blood Culture (routine x 2)     Status: None   Collection Time: 03/06/16  9:04 PM  Result Value Ref Range Status   Specimen Description BLOOD LEFT ANTECUBITAL  Final   Special Requests BOTTLES DRAWN AEROBIC AND ANAEROBIC [place] EA  Final   Culture   Final    NO GROWTH 5 DAYS Performed at [facility]    Report Status 03/12/2016 FINAL  Final  Culture, blood (Routine X 2) w Reflex to ID Panel     Status: None (Preliminary result)   Collection Time: 03/13/16  4:25 AM  Result Value Ref Range Status   Specimen Description BLOOD RIGHT ARM  Final   Special Requests BOTTLES DRAWN AEROBIC AND ANAEROBIC 5ML  Final   Culture NO GROWTH 3 DAYS  Final   Report Status PENDING  Incomplete  Culture, blood (Routine X 2) w Reflex to ID Panel     Status: None (Preliminary result)   Collection Time: 03/13/16  4:47 AM  Result Value Ref Range Status   Specimen Description BLOOD RIGHT HAND  Final   Special Requests BOTTLES DRAWN AEROBIC AND ANAEROBIC 5ML  Final   Culture NO GROWTH 3 DAYS  Final   Report Status PENDING  Incomplete   Time coordinating discharge: Over 30 minutes  SIGNED:  Kerney Elbe, DO [facility] 03/16/2016, 4:46 PM Pager [phone]  If 7PM-7AM, please contact night-coverage [email or web address] Password [email or web address]

## 2016-03-16 NOTE — Progress Notes (Signed)
Nsg Discharge Note  Admit Date:  03/12/2016 Discharge date: 03/16/2016   [name] to be D/C'd home per MD order.  AVS completed.  Copy for chart, and copy for patient signed, and dated. Patient/caregiver able to verbalize understanding.  Discharge Medication:   Medication List    STOP taking these medications   ciprofloxacin 500 MG tablet Commonly known as:  CIPRO   enoxaparin 150 MG/ML injection Commonly known as:  LOVENOX     TAKE these medications   cholecalciferol 1000 units tablet Commonly known as:  VITAMIN D Take 1,000 Units by mouth daily.   CRESTOR 40 MG tablet Generic drug:  rosuvastatin Take 40 mg by mouth daily.   diltiazem 120 MG 24 hr capsule Commonly known as:  CARDIZEM CD Take 1 capsule (120 mg total) by mouth daily.   doxycycline 100 MG tablet Commonly known as:  VIBRA-TABS Take 1 tablet (100 mg total) by mouth every 12 (twelve) hours.   metoprolol 50 MG tablet Commonly known as:  LOPRESSOR Take 1 tablet (50 mg total) by mouth 2 (two) times daily.   tamsulosin 0.4 MG Caps capsule Commonly known as:  FLOMAX Take 1 capsule (0.4 mg total) by mouth daily after supper.   vitamin B-12 1000 MCG tablet Commonly known as:  CYANOCOBALAMIN Take 1,000 mcg by mouth 2 (two) times a week.   warfarin 5 MG tablet Commonly known as:  COUMADIN Take 1 tablet (5 mg total) by mouth daily at 6 PM.       Discharge Assessment: Vitals:   03/16/16 0944 03/16/16 1445  BP: (!) 131/45 (!) 133/48  Pulse: (!) 59 (!) 57  Resp:  17  Temp:     Skin clean, dry and intact without evidence of skin break down, no evidence of skin tears noted. IV catheter discontinued intact. Site without signs and symptoms of complications - no redness or edema noted at insertion site, patient denies c/o pain - only slight tenderness at site.  Dressing with slight pressure applied.  D/c Instructions-Education: Discharge instructions given to patient/family with verbalized  understanding. D/c education completed with patient/family including follow up instructions, medication list, d/c activities limitations if indicated, with other d/c instructions as indicated by MD - patient able to verbalize understanding, all questions fully answered. Patient instructed to return to ED, call 911, or call MD for any changes in condition.  Patient escorted via [place], and D/C home via private auto.  Wyonia Hough, RN 03/16/2016 4:57 PM

## 2016-03-16 NOTE — Progress Notes (Signed)
Patient ID: [name], male   DOB: 1940-11-10, 75 y.o.   MRN: [id number] Subjective: Patient is able to get up and walk to the bathroom his pain is diminished compared to yesterday he remains afebrile.  Objective: Synovial fluid that was aspirated at my office last Friday is culture negative. He still has an area of warmth and erythema over the anterior aspect of the knee but there is no palpable fluid collection in the prepatellar bursa. Range of motion of the knee is 10/100. Foot tap is negative collateral ligaments are stable there is no pain with varus or valgus stress. No pain with firing the quadrant or hamstrings.  Assessment: Cellulitis over the distal aspect of the left femur and prepatellar region of the left knee. No fluid collection in the bursa. Synovial cultures done 4 days ago remain negative.  Plan: Continue IV antibiotics per internal medicine. Will follow.

## 2016-03-16 NOTE — Progress Notes (Signed)
Physical Therapy Treatment Patient Details Name: [name] MRN: [id number] DOB: Nov 27, 1940 Today's Date: 03/16/2016    History of Present Illness [name] is a 75 y.o. male with medical history significant Hx of Prior CVA, Left CEA, Craniotomy for Meningioma, HTN, HLD, CKD Stage 3 who was recently hospitalizatied October 21 to 23rd, for new-onset atrial fibrillation in the setting of sepsis, due to urinary tract infection. The day he was discharged he noticed left knee pain, the pain has been sharp in nature, worsening in intensity up to a 10 out of 10,    PT Comments    The pt is making progress toward all goals, although he is limited by pain and decreased endurance.  HR was in range of 61-67 BPM during session.  Pt was able to tolerate stair training today, but he needed to rest before and after.  Continue with POC.  Follow Up Recommendations  Home health PT     Equipment Recommendations  None recommended by PT    Recommendations for Other Services       Precautions / Restrictions Precautions Precautions: None Precaution Comments: pt denies h/o falls in past 1 year Restrictions Weight Bearing Restrictions: No    Mobility  Bed Mobility Overal bed mobility: Needs Assistance Bed Mobility: Supine to Sit     Supine to sit: Min guard     General bed mobility comments: Practiced going supine to sit with bed flat.  Demonstrated log roll.  Cues for technique and hand placement.  Transfers Overall transfer level: Needs assistance Equipment used: Rolling walker (2 wheeled) Transfers: Sit to/from Stand Sit to Stand: Min assist         General transfer comment: Sit to stand from different surfaces: bed, chair, bedside commode.  Required VCs for proper hand placement during transfers.  Ambulation/Gait Ambulation/Gait assistance: Min assist Ambulation Distance (Feet): 75 Feet (second trial of 14 ft (stair training performed, third trial of 75  ft.)) Assistive device: Rolling walker (2 wheeled) Gait Pattern/deviations: Step-through pattern;Decreased step length - right;Decreased step length - left;Antalgic   Gait velocity interpretation: Below normal speed for age/gender General Gait Details: verbal cues to look forward and to keep inside the walker.   Stairs Stairs: Yes Stairs assistance: +2 physical assistance (min assist) Stair Management: No rails;Step to pattern;Backwards;With walker Number of Stairs: 4 General stair comments: Instructed on technique.  Verbal cues for proper sequencing.  Also instructed wife of technique.  Wheelchair Mobility    Modified Rankin (Stroke Patients Only)       Balance Overall balance assessment: Modified Independent Sitting-balance support: No upper extremity supported Sitting balance-Leahy Scale: Good     Standing balance support: Bilateral upper extremity supported Standing balance-Leahy Scale: Poor Standing balance comment: Needs UE support                    Cognition Arousal/Alertness: Awake/alert Behavior During Therapy: WFL for tasks assessed/performed Overall Cognitive Status: Within Functional Limits for tasks assessed                      Exercises      General Comments        Pertinent Vitals/Pain Pain Assessment: 0-10 Pain Score: 2  Pain Location: L knee Pain Descriptors / Indicators: Sore Pain Intervention(s): Monitored during session;Limited activity within patient's tolerance    Home Living  Prior Function            PT Goals (current goals can now be found in the care plan section) Acute Rehab PT Goals Patient Stated Goal: home PT Goal Formulation: With patient Time For Goal Achievement: 03/29/16 Potential to Achieve Goals: Good Progress towards PT goals: Progressing toward goals    Frequency    Min 3X/week      PT Plan      Co-evaluation             End of Session Equipment  Utilized During Treatment: Gait belt Activity Tolerance: Patient tolerated treatment well Patient left: in bed;with call bell/phone within reach;with family/visitor present     Time: 6579-0383 PT Time Calculation (min) (ACUTE ONLY): 40 min  Charges:  $Gait Training: 23-37 mins $Therapeutic Activity: 8-22 mins                    G Codes:      Bary Castilla 03-21-16, 3:19 PM Rito Ehrlich. [place], [place]

## 2016-03-16 NOTE — Care Management Important Message (Signed)
Important Message  Patient Details  Name: [name] MRN: [id number] Date of Birth: [birth year]   Medicare Important Message Given:  Yes    Nathen May 03/16/2016, 12:37 PM

## 2016-03-17 ENCOUNTER — Other Ambulatory Visit: Payer: Self-pay | Admitting: *Deleted

## 2016-03-17 DIAGNOSIS — R339 Retention of urine, unspecified: Secondary | ICD-10-CM | POA: Diagnosis not present

## 2016-03-17 DIAGNOSIS — Z7901 Long term (current) use of anticoagulants: Secondary | ICD-10-CM | POA: Diagnosis not present

## 2016-03-17 DIAGNOSIS — N183 Chronic kidney disease, stage 3 (moderate): Secondary | ICD-10-CM | POA: Diagnosis not present

## 2016-03-17 DIAGNOSIS — I4891 Unspecified atrial fibrillation: Secondary | ICD-10-CM | POA: Diagnosis not present

## 2016-03-17 DIAGNOSIS — N289 Disorder of kidney and ureter, unspecified: Secondary | ICD-10-CM | POA: Diagnosis not present

## 2016-03-17 DIAGNOSIS — E785 Hyperlipidemia, unspecified: Secondary | ICD-10-CM | POA: Diagnosis not present

## 2016-03-17 DIAGNOSIS — I129 Hypertensive chronic kidney disease with stage 1 through stage 4 chronic kidney disease, or unspecified chronic kidney disease: Secondary | ICD-10-CM | POA: Diagnosis not present

## 2016-03-17 DIAGNOSIS — Z466 Encounter for fitting and adjustment of urinary device: Secondary | ICD-10-CM | POA: Diagnosis not present

## 2016-03-17 DIAGNOSIS — L03116 Cellulitis of left lower limb: Secondary | ICD-10-CM | POA: Diagnosis not present

## 2016-03-17 NOTE — Patient Outreach (Signed)
Initial transition of care call. I spoke with Mr. Kulinski only briefly as the home health nurse was making her visit. He reports he feels much better and his knee is less painful but still tender and a little swollen.   I told him I will call him tomorrow to complete my assessment. He is agreeable to this.  Deloria Lair [facility] [place] [phone]

## 2016-03-18 LAB — CULTURE, BLOOD (ROUTINE X 2)
CULTURE: NO GROWTH
Culture: NO GROWTH

## 2016-03-22 DIAGNOSIS — R338 Other retention of urine: Secondary | ICD-10-CM | POA: Diagnosis not present

## 2016-03-22 DIAGNOSIS — R609 Edema, unspecified: Secondary | ICD-10-CM | POA: Diagnosis not present

## 2016-03-22 DIAGNOSIS — R339 Retention of urine, unspecified: Secondary | ICD-10-CM | POA: Diagnosis not present

## 2016-03-22 DIAGNOSIS — L03116 Cellulitis of left lower limb: Secondary | ICD-10-CM | POA: Diagnosis not present

## 2016-03-23 ENCOUNTER — Other Ambulatory Visit: Payer: Self-pay | Admitting: *Deleted

## 2016-03-23 DIAGNOSIS — M25562 Pain in left knee: Secondary | ICD-10-CM | POA: Diagnosis not present

## 2016-03-23 NOTE — Patient Outreach (Signed)
Transition of care call. Pt did not answer his phone. I left a message and requested that he call me back.  Deloria Lair [facility] [place] [phone]

## 2016-03-24 ENCOUNTER — Other Ambulatory Visit: Payer: Self-pay | Admitting: *Deleted

## 2016-03-24 ENCOUNTER — Encounter: Payer: Self-pay | Admitting: *Deleted

## 2016-03-24 DIAGNOSIS — L03116 Cellulitis of left lower limb: Secondary | ICD-10-CM | POA: Diagnosis not present

## 2016-03-24 DIAGNOSIS — I4891 Unspecified atrial fibrillation: Secondary | ICD-10-CM | POA: Diagnosis not present

## 2016-03-24 DIAGNOSIS — R339 Retention of urine, unspecified: Secondary | ICD-10-CM | POA: Diagnosis not present

## 2016-03-24 DIAGNOSIS — Z7901 Long term (current) use of anticoagulants: Secondary | ICD-10-CM | POA: Diagnosis not present

## 2016-03-24 DIAGNOSIS — E785 Hyperlipidemia, unspecified: Secondary | ICD-10-CM | POA: Diagnosis not present

## 2016-03-24 DIAGNOSIS — N183 Chronic kidney disease, stage 3 (moderate): Secondary | ICD-10-CM | POA: Diagnosis not present

## 2016-03-24 DIAGNOSIS — N289 Disorder of kidney and ureter, unspecified: Secondary | ICD-10-CM | POA: Diagnosis not present

## 2016-03-24 DIAGNOSIS — I129 Hypertensive chronic kidney disease with stage 1 through stage 4 chronic kidney disease, or unspecified chronic kidney disease: Secondary | ICD-10-CM | POA: Diagnosis not present

## 2016-03-24 DIAGNOSIS — Z466 Encounter for fitting and adjustment of urinary device: Secondary | ICD-10-CM | POA: Diagnosis not present

## 2016-03-24 NOTE — Patient Outreach (Addendum)
Transition of care call #2. Pt states he is doing much better. He had had follow up visits with his primary care provider, orthopedist and will see the cardiologist next week for evaluation of new onset AFIB. He reports his knee edema is down, it is still a bit touchy. He is still taking his antibiotics. I reminded him he can call me if he has any questions or problems. I will call him again in a week.  Patient was recently discharged from hospital and all medications have been reviewed.   Medication List       Accurate as of 03/24/16  6:25 PM. Always use your most recent med list.          cholecalciferol 1000 units tablet Commonly known as:  VITAMIN D Take 1,000 Units by mouth daily.   CRESTOR 40 MG tablet Generic drug:  rosuvastatin Take 40 mg by mouth daily.   diltiazem 120 MG 24 hr capsule Commonly known as:  CARDIZEM CD Take 1 capsule (120 mg total) by mouth daily.   doxycycline 100 MG tablet Commonly known as:  VIBRA-TABS Take 1 tablet (100 mg total) by mouth every 12 (twelve) hours.   metoprolol 50 MG tablet Commonly known as:  LOPRESSOR Take 1 tablet (50 mg total) by mouth 2 (two) times daily.   tamsulosin 0.4 MG Caps capsule Commonly known as:  FLOMAX Take 1 capsule (0.4 mg total) by mouth daily after supper.   vitamin B-12 1000 MCG tablet Commonly known as:  CYANOCOBALAMIN Take 1,000 mcg by mouth 2 (two) times a week.   warfarin 5 MG tablet Commonly known as:  COUMADIN Take 1 tablet (5 mg total) by mouth daily at 6 PM.      [facility] CM Care Plan Problem One   Flowsheet Row Most Recent Value  Care Plan Problem One  Sepsis (cellulitis of knee)  Role Documenting the Problem One  Care Management Coordinator  Care Plan for Problem One  Active  [facility] CM Short Term Goal #1 (0-30 days)  Pt to follow medical regimen for infection over the next 30 days.  THN CM Short Term Goal #1 Start Date  03/24/16  Interventions for Short Term Goal #1  Encouraged pt to finish his  antibiotic and report any signs of return of infection, swelling, redness, drainage, fever.    [facility] CM Care Plan Problem Two   Flowsheet Row Most Recent Value  Care Plan Problem Two  New onset AFIB  Role Documenting the Problem Two  Care Management Coordinator  Care Plan for Problem Two  Active  Interventions for Problem Two Long Term Goal   Reminded pt of upcoming cardiology appt. Verified pt has his coumadin and is taking it as prescribed.  THN Long Term Goal (31-90) days  Pt will learn about new diagnosis and how to manage and discuss action plan by the end of 90 days.  THN Long Term Goal Start Date  03/24/16  THN CM Short Term Goal #1 (0-30 days)  Pt will report the importance of coumadin and knowledge of therapeutic range at the end of 30 days.  [facility]N CM Short Term Goal #1 Start Date  03/24/16  Interventions for Short Term Goal #2   Verified pt had lab work and is taking coumadin.     Deloria Lair [facility] [place] [phone]  Deloria Lair [facility] [phone]

## 2016-03-26 DIAGNOSIS — R609 Edema, unspecified: Secondary | ICD-10-CM | POA: Diagnosis not present

## 2016-03-26 DIAGNOSIS — Z7901 Long term (current) use of anticoagulants: Secondary | ICD-10-CM | POA: Diagnosis not present

## 2016-03-26 DIAGNOSIS — L03116 Cellulitis of left lower limb: Secondary | ICD-10-CM | POA: Diagnosis not present

## 2016-03-26 DIAGNOSIS — Z5181 Encounter for therapeutic drug level monitoring: Secondary | ICD-10-CM | POA: Diagnosis not present

## 2016-03-30 ENCOUNTER — Ambulatory Visit (INDEPENDENT_AMBULATORY_CARE_PROVIDER_SITE_OTHER): Payer: PPO | Admitting: Cardiology

## 2016-03-30 ENCOUNTER — Other Ambulatory Visit: Payer: Self-pay | Admitting: *Deleted

## 2016-03-30 ENCOUNTER — Encounter: Payer: Self-pay | Admitting: Cardiology

## 2016-03-30 VITALS — BP 110/58 | HR 49 | Ht 70.0 in | Wt 223.0 lb

## 2016-03-30 DIAGNOSIS — I4891 Unspecified atrial fibrillation: Secondary | ICD-10-CM

## 2016-03-30 DIAGNOSIS — L03116 Cellulitis of left lower limb: Secondary | ICD-10-CM | POA: Diagnosis not present

## 2016-03-30 DIAGNOSIS — N289 Disorder of kidney and ureter, unspecified: Secondary | ICD-10-CM | POA: Diagnosis not present

## 2016-03-30 DIAGNOSIS — R339 Retention of urine, unspecified: Secondary | ICD-10-CM | POA: Diagnosis not present

## 2016-03-30 DIAGNOSIS — N183 Chronic kidney disease, stage 3 (moderate): Secondary | ICD-10-CM | POA: Diagnosis not present

## 2016-03-30 DIAGNOSIS — Z466 Encounter for fitting and adjustment of urinary device: Secondary | ICD-10-CM | POA: Diagnosis not present

## 2016-03-30 DIAGNOSIS — E785 Hyperlipidemia, unspecified: Secondary | ICD-10-CM | POA: Diagnosis not present

## 2016-03-30 DIAGNOSIS — Z7901 Long term (current) use of anticoagulants: Secondary | ICD-10-CM | POA: Diagnosis not present

## 2016-03-30 DIAGNOSIS — I129 Hypertensive chronic kidney disease with stage 1 through stage 4 chronic kidney disease, or unspecified chronic kidney disease: Secondary | ICD-10-CM | POA: Diagnosis not present

## 2016-03-30 NOTE — Patient Instructions (Addendum)
Medication Instructions: - Your physician recommends that you continue on your current medications as directed. Please refer to the Current Medication list given to you today.  Labwork: - none today  Procedures/Testing: - none today  Follow-Up: - Your physician recommends that you schedule a follow-up appointment in: 2 weeks with our Coumadin Clinic (new to establish here)  - Your physician recommends that you schedule a follow-up appointment in: 3 months with Dr. Curt Bears.   Any Additional Special Instructions Will Be Listed Below (If Applicable).     If you need a refill on your cardiac medications before your next appointment, please call your pharmacy.

## 2016-03-30 NOTE — Patient Outreach (Signed)
Transition of care call. [name] is doing very well. He is walking well with his walker. He denies any pain or edema of his knee. He has a cardiology appt this afternoon. He says he is not experiencing any palpitations that he is aware of. He denies any problems what so ever.  I have reminded him that I am available if he needs anything and that I will check in on him again next week.  [name] [facility] [place] [phone]

## 2016-03-30 NOTE — Progress Notes (Signed)
Electrophysiology Office Note   Date:  03/30/2016   ID:  Harland German, DOB 03/21/1941, MRN [id number]  PCP:  Irven Shelling, MD  Primary Electrophysiologist:  Constance Haw, MD    Chief Complaint  Patient presents with  . New Patient (Initial Visit)    AFib     History of Present Illness: Treshon Stannard is a 75 y.o. male who presents today for electrophysiology evaluation.   He was admitted to the hospital at the end of October with new onset atrial fibrillation, renal failure, left knee cellulitis. He was rate controlled with 120 mg of diltiazem as well as metoprolol and anticoagulated with warfarin. He presents to clinic today as a follow-up post hospitalization. He says that he has been feeling well and gaining strength. He was seen by orthopedics who say that his knee is improving with no further evidence of cellulitis. He does not have any symptoms of palpitations, shortness of breath, or fatigue that would make me think he was having more atrial fibrillation. He was put on Lasix last week for lower extremity edema and loss up to 14 pounds.  Today, he denies symptoms of palpitations, chest pain, shortness of breath, orthopnea, PND, lower extremity edema, claudication, dizziness, presyncope, syncope, bleeding, or neurologic sequela. The patient is tolerating medications without difficulties and is otherwise without complaint today.    Past Medical History:  Diagnosis Date  . Atrial fibrillation ([place]) 02/2016   NEW ONSET  . Brain tumor (benign) [facility]) March 2015   2 small tumors  . Carotid artery occlusion   . Cerebrovascular disease September 09, 2001   TIA, Left brain  . CKD (chronic kidney disease), stage III   . Diverticulosis   . ED (erectile dysfunction)   . Guillain-Barre syndrome ([place]) 1999  . Hx of elevated lipids   . Hyperlipidemia   . Hypertension   . Memory loss   . Stroke [facility]) 2003   Past Surgical History:  Procedure Laterality Date  .  BRAIN SURGERY  1998   Benign brain tumor removed, Left hemisphere- ? Meningioma  . CAROTID ENDARTERECTOMY Left September 13, 2001   LEFT cea  . CHOLECYSTECTOMY  Nov. 2001  . COLON SURGERY  Feb. 2003   Colonic polyps removed endoscopically  . COLONOSCOPY WITH PROPOFOL N/A 11/11/2015   Procedure: COLONOSCOPY WITH PROPOFOL;  Surgeon: Garlan Fair, MD;  Location: [facility] ENDOSCOPY;  Service: Endoscopy;  Laterality: N/A;  . INGUINAL HERNIA REPAIR  1970's     Current Outpatient Prescriptions  Medication Sig Dispense Refill  . cholecalciferol (VITAMIN D) 1000 units tablet Take 1,000 Units by mouth daily.    . CRESTOR 40 MG tablet Take 40 mg by mouth daily.     Marland Kitchen diltiazem (CARDIZEM CD) 120 MG 24 hr capsule Take 1 capsule (120 mg total) by mouth daily. 30 capsule 0  . metoprolol (LOPRESSOR) 50 MG tablet Take 1 tablet (50 mg total) by mouth 2 (two) times daily. 60 tablet 0  . tamsulosin (FLOMAX) 0.4 MG CAPS capsule Take 1 capsule (0.4 mg total) by mouth daily after supper. 30 capsule 0  . vitamin B-12 (CYANOCOBALAMIN) 1000 MCG tablet Take 1,000 mcg by mouth 2 (two) times a week.     . warfarin (COUMADIN) 5 MG tablet Take 1 tablet (5 mg total) by mouth daily at 6 PM. 30 tablet 0   No current facility-administered medications for this visit.     Allergies:   Asa [aspirin] and Immune globulins  Social History:  The patient  reports that he quit smoking about 19 years ago. His smoking use included Cigarettes. He has never used smokeless tobacco. He reports that he does not drink alcohol or use drugs.   Family History:  The patient's family history includes Cancer in his mother; Colon cancer in his mother; Diabetes in his father; Stroke in his father.    ROS:  Please see the history of present illness.   Otherwise, review of systems is positive for weight change, fatigue, knee pain, palpitations, cough, balance problems, easy bruising.   All other systems are reviewed and negative.    PHYSICAL  EXAM: VS:  BP (!) 110/58   Pulse (!) 49   Ht 5\' 10"  (1.778 m)   Wt 223 lb (101.2 kg)   BMI 32.00 kg/m  , BMI Body mass index is 32 kg/m. GEN: Well nourished, well developed, in no acute distress  HEENT: normal  Neck: no JVD, carotid bruits, or masses Cardiac: RRR; no murmurs, rubs, or gallops,no edema  Respiratory:  clear to auscultation bilaterally, normal work of breathing GI: soft, nontender, nondistended, + BS MS: no deformity or atrophy  Skin: warm and dry Neuro:  Strength and sensation are intact Psych: euthymic mood, full affect  EKG:  EKG is ordered today. Personal review of the ekg ordered shows sinus rhythm, rate 49, septal infarct  Recent Labs: 03/07/2016: B Natriuretic Peptide 337.6; TSH 1.671 03/16/2016: ALT 77; BUN 12; Creatinine, Ser 1.46; Hemoglobin 11.8; Magnesium 1.7; Platelets 265; Potassium 4.1; Sodium 142    Lipid Panel  No results found for: CHOL, TRIG, HDL, CHOLHDL, VLDL, LDLCALC, LDLDIRECT   Wt Readings from Last 3 Encounters:  03/30/16 223 lb (101.2 kg)  03/16/16 233 lb 0.4 oz (105.7 kg)  03/08/16 231 lb 7.7 oz (105 kg)      Other studies Reviewed: Additional studies/ records that were reviewed today include: TTE 03/07/16  Review of the above records today demonstrates:  - Left ventricle: The cavity size was normal. Wall thickness was   normal. Systolic function was normal. The estimated ejection   fraction was in the range of 60% to 65%. Wall motion was normal;   there were no regional wall motion abnormalities. - Aortic valve: Valve area (VTI): 1.68 cm^2. Valve area (Vmax):   1.71 cm^2. Valve area (Vmean): 1.68 cm^2. - Left atrium: The atrium was mildly dilated. - Right atrium: The atrium was mildly dilated.   ASSESSMENT AND PLAN:  1.  Atrial fibrillation: on coumadin, metoprolol, diltiazem. Currently he is in sinus rhythm. It does not appear that he has symptoms from his atrial fibrillation. We'll therefore plan for a rate control  strategy. He is in sinus rhythm today and Amaru Burroughs not start him on antiarrhythmics. We Ryin Schillo set him up in Coumadin clinic. Of note he had a therapeutic INR last Friday.  This patients CHA2DS2-VASc Score and unadjusted Ischemic Stroke Rate (% per year) is equal to 7.2 % stroke rate/year from a score of 5  Above score calculated as 1 point each if present [CHF, HTN, DM, Vascular=MI/PAD/Aortic Plaque, Age if 65-74, or Male] Above score calculated as 2 points each if present [Age > 75, or Stroke/TIA/TE]  2. Hypertension: Currently well controlled   Current medicines are reviewed at length with the patient today.   The patient does not have concerns regarding his medicines.  The following changes were made today:  none  Labs/ tests ordered today include:  No orders of the defined types  were placed in this encounter.    Disposition:   FU with Willistine Ferrall 3 months  Signed, Kimorah Ridolfi Meredith Leeds, MD  03/30/2016 2:13 PM     [place] [address] [phone] (office) [phone] (fax)

## 2016-04-02 ENCOUNTER — Telehealth: Payer: Self-pay | Admitting: Cardiology

## 2016-04-02 DIAGNOSIS — R339 Retention of urine, unspecified: Secondary | ICD-10-CM | POA: Diagnosis not present

## 2016-04-02 DIAGNOSIS — Z7901 Long term (current) use of anticoagulants: Secondary | ICD-10-CM | POA: Diagnosis not present

## 2016-04-02 DIAGNOSIS — L03116 Cellulitis of left lower limb: Secondary | ICD-10-CM | POA: Diagnosis not present

## 2016-04-02 DIAGNOSIS — N289 Disorder of kidney and ureter, unspecified: Secondary | ICD-10-CM | POA: Diagnosis not present

## 2016-04-02 DIAGNOSIS — I129 Hypertensive chronic kidney disease with stage 1 through stage 4 chronic kidney disease, or unspecified chronic kidney disease: Secondary | ICD-10-CM | POA: Diagnosis not present

## 2016-04-02 DIAGNOSIS — N183 Chronic kidney disease, stage 3 (moderate): Secondary | ICD-10-CM | POA: Diagnosis not present

## 2016-04-02 DIAGNOSIS — E785 Hyperlipidemia, unspecified: Secondary | ICD-10-CM | POA: Diagnosis not present

## 2016-04-02 DIAGNOSIS — Z466 Encounter for fitting and adjustment of urinary device: Secondary | ICD-10-CM | POA: Diagnosis not present

## 2016-04-02 DIAGNOSIS — I4891 Unspecified atrial fibrillation: Secondary | ICD-10-CM | POA: Diagnosis not present

## 2016-04-02 NOTE — Telephone Encounter (Signed)
Spoke with patient's wife, [name] ([place]), who states patient was advised at office visit on 11/14 with Dr. Curt Johnson to give patient lasix 80 mg once daily Tuesday through Friday. She states she gave the patient the first pill on Wednesday instead of Tuesday because of the timing of their return home. The lasix was prescribed by Dr. Laurann Montana, PCP for weight gain and swelling 1 week prior. Wife reports weight: 223.5 lb on Wed. 11/15 215.5 lb today She reports patient's base weight is 220 lb.  She has not given him any lasix today.  I advised she give him 1/2 tab (40 mg) today and then d/c.  I advised I will forward message to Dr. Curt Johnson for his awareness and will call her back if he has different or additional instructions.  She verbalized understanding and agreement with plan.

## 2016-04-02 NOTE — Telephone Encounter (Signed)
Called and advised patient's wife that per Dr. Curt Bears, patient should take lasix 80 mg if weight increases 2 lb in a day or 5 lb in a week.  She verbalized understanding and agreement with plan and thanked me for the call.

## 2016-04-02 NOTE — Telephone Encounter (Signed)
New message       Pt c/o medication issue:  1. Name of Medication: furosemide 2. How are you currently taking this medication (dosage and times per day)? 80mg  daily 3. Are you having a reaction (difficulty breathing--STAT)? no  4. What is your medication issue? Wife is calling to ask if pt need to continue taking fluid pill.  Weight on wed was 223.5, thurs 218.5 and this am 215.5.  Please advise

## 2016-04-02 NOTE — Telephone Encounter (Signed)
Continue daily weights.  Can give extra dose if weight going up by 2 lbs per day.

## 2016-04-04 ENCOUNTER — Inpatient Hospital Stay (HOSPITAL_COMMUNITY)
Admission: EM | Admit: 2016-04-04 | Discharge: 2016-04-14 | DRG: 096 | Disposition: A | Discharge status: Rehabilitation Facility | Payer: PPO | Attending: Internal Medicine | Admitting: Internal Medicine

## 2016-04-04 ENCOUNTER — Encounter (HOSPITAL_COMMUNITY): Payer: Self-pay

## 2016-04-04 DIAGNOSIS — M792 Neuralgia and neuritis, unspecified: Secondary | ICD-10-CM

## 2016-04-04 DIAGNOSIS — Z7901 Long term (current) use of anticoagulants: Secondary | ICD-10-CM | POA: Diagnosis not present

## 2016-04-04 DIAGNOSIS — Z8673 Personal history of transient ischemic attack (TIA), and cerebral infarction without residual deficits: Secondary | ICD-10-CM

## 2016-04-04 DIAGNOSIS — I10 Essential (primary) hypertension: Secondary | ICD-10-CM | POA: Diagnosis not present

## 2016-04-04 DIAGNOSIS — I480 Paroxysmal atrial fibrillation: Secondary | ICD-10-CM | POA: Diagnosis not present

## 2016-04-04 DIAGNOSIS — D72829 Elevated white blood cell count, unspecified: Secondary | ICD-10-CM

## 2016-04-04 DIAGNOSIS — R29898 Other symptoms and signs involving the musculoskeletal system: Secondary | ICD-10-CM | POA: Diagnosis not present

## 2016-04-04 DIAGNOSIS — Z886 Allergy status to analgesic agent status: Secondary | ICD-10-CM

## 2016-04-04 DIAGNOSIS — M6281 Muscle weakness (generalized): Secondary | ICD-10-CM | POA: Diagnosis not present

## 2016-04-04 DIAGNOSIS — Z86018 Personal history of other benign neoplasm: Secondary | ICD-10-CM

## 2016-04-04 DIAGNOSIS — G252 Other specified forms of tremor: Secondary | ICD-10-CM | POA: Diagnosis present

## 2016-04-04 DIAGNOSIS — D329 Benign neoplasm of meninges, unspecified: Secondary | ICD-10-CM | POA: Diagnosis present

## 2016-04-04 DIAGNOSIS — W1830XA Fall on same level, unspecified, initial encounter: Secondary | ICD-10-CM | POA: Diagnosis present

## 2016-04-04 DIAGNOSIS — Z9109 Other allergy status, other than to drugs and biological substances: Secondary | ICD-10-CM

## 2016-04-04 DIAGNOSIS — R29701 NIHSS score 1: Secondary | ICD-10-CM | POA: Diagnosis present

## 2016-04-04 DIAGNOSIS — E785 Hyperlipidemia, unspecified: Secondary | ICD-10-CM | POA: Diagnosis not present

## 2016-04-04 DIAGNOSIS — N289 Disorder of kidney and ureter, unspecified: Secondary | ICD-10-CM | POA: Diagnosis not present

## 2016-04-04 DIAGNOSIS — I482 Chronic atrial fibrillation: Secondary | ICD-10-CM | POA: Diagnosis not present

## 2016-04-04 DIAGNOSIS — I6521 Occlusion and stenosis of right carotid artery: Secondary | ICD-10-CM | POA: Diagnosis not present

## 2016-04-04 DIAGNOSIS — I129 Hypertensive chronic kidney disease with stage 1 through stage 4 chronic kidney disease, or unspecified chronic kidney disease: Secondary | ICD-10-CM | POA: Diagnosis not present

## 2016-04-04 DIAGNOSIS — N319 Neuromuscular dysfunction of bladder, unspecified: Secondary | ICD-10-CM | POA: Diagnosis not present

## 2016-04-04 DIAGNOSIS — Z79899 Other long term (current) drug therapy: Secondary | ICD-10-CM | POA: Diagnosis not present

## 2016-04-04 DIAGNOSIS — R29810 Facial weakness: Secondary | ICD-10-CM | POA: Diagnosis present

## 2016-04-04 DIAGNOSIS — R296 Repeated falls: Secondary | ICD-10-CM | POA: Diagnosis present

## 2016-04-04 DIAGNOSIS — Z8669 Personal history of other diseases of the nervous system and sense organs: Secondary | ICD-10-CM

## 2016-04-04 DIAGNOSIS — G610 Guillain-Barre syndrome: Secondary | ICD-10-CM | POA: Diagnosis not present

## 2016-04-04 DIAGNOSIS — R339 Retention of urine, unspecified: Secondary | ICD-10-CM | POA: Diagnosis not present

## 2016-04-04 DIAGNOSIS — I4891 Unspecified atrial fibrillation: Secondary | ICD-10-CM | POA: Diagnosis not present

## 2016-04-04 DIAGNOSIS — R338 Other retention of urine: Secondary | ICD-10-CM | POA: Diagnosis present

## 2016-04-04 DIAGNOSIS — N183 Chronic kidney disease, stage 3 unspecified: Secondary | ICD-10-CM

## 2016-04-04 DIAGNOSIS — Z87891 Personal history of nicotine dependence: Secondary | ICD-10-CM

## 2016-04-04 LAB — CBC WITH DIFFERENTIAL/PLATELET
BASOS ABS: 0.1 10*3/uL (ref 0.0–0.1)
BASOS PCT: 0 %
EOS PCT: 1 %
Eosinophils Absolute: 0.2 10*3/uL (ref 0.0–0.7)
HEMATOCRIT: 44.4 % (ref 39.0–52.0)
Hemoglobin: 14.1 g/dL (ref 13.0–17.0)
Lymphocytes Relative: 14 %
Lymphs Abs: 2.1 10*3/uL (ref 0.7–4.0)
MCH: 28.4 pg (ref 26.0–34.0)
MCHC: 31.8 g/dL (ref 30.0–36.0)
MCV: 89.5 fL (ref 78.0–100.0)
MONO ABS: 1.2 10*3/uL — AB (ref 0.1–1.0)
Monocytes Relative: 8 %
NEUTROS ABS: 11.2 10*3/uL — AB (ref 1.7–7.7)
Neutrophils Relative %: 77 %
PLATELETS: 248 10*3/uL (ref 150–400)
RBC: 4.96 MIL/uL (ref 4.22–5.81)
RDW: 13.6 % (ref 11.5–15.5)
WBC: 14.7 10*3/uL — ABNORMAL HIGH (ref 4.0–10.5)

## 2016-04-04 LAB — URINALYSIS, ROUTINE W REFLEX MICROSCOPIC
BILIRUBIN URINE: NEGATIVE
Glucose, UA: NEGATIVE mg/dL
KETONES UR: NEGATIVE mg/dL
NITRITE: NEGATIVE
PH: 5.5 (ref 5.0–8.0)
Protein, ur: NEGATIVE mg/dL
Specific Gravity, Urine: 1.018 (ref 1.005–1.030)

## 2016-04-04 LAB — URINE MICROSCOPIC-ADD ON

## 2016-04-04 LAB — BASIC METABOLIC PANEL
ANION GAP: 10 (ref 5–15)
BUN: 14 mg/dL (ref 6–20)
CALCIUM: 9.1 mg/dL (ref 8.9–10.3)
CO2: 25 mmol/L (ref 22–32)
Chloride: 106 mmol/L (ref 101–111)
Creatinine, Ser: 1.57 mg/dL — ABNORMAL HIGH (ref 0.61–1.24)
GFR, EST AFRICAN AMERICAN: 48 mL/min — AB (ref >60)
GFR, EST NON AFRICAN AMERICAN: 41 mL/min — AB (ref >60)
Glucose, Bld: 117 mg/dL — ABNORMAL HIGH (ref 65–99)
Potassium: 4.2 mmol/L (ref 3.5–5.1)
Sodium: 141 mmol/L (ref 135–145)

## 2016-04-04 LAB — PROTIME-INR
INR: 2.33
PROTHROMBIN TIME: 26 s — AB (ref 11.4–15.2)

## 2016-04-04 LAB — MAGNESIUM: Magnesium: 2.3 mg/dL (ref 1.7–2.4)

## 2016-04-04 LAB — TSH: TSH: 4.274 u[IU]/mL (ref 0.350–4.500)

## 2016-04-04 MED ORDER — ROSUVASTATIN CALCIUM 20 MG PO TABS
40.0000 mg | ORAL_TABLET | Freq: Every day | ORAL | Status: DC
  Administered 2016-04-05 – 2016-04-14 (×10): 40 mg via ORAL
  Filled 2016-04-04 (×10): qty 2

## 2016-04-04 MED ORDER — VITAMIN B-12 1000 MCG PO TABS
1000.0000 ug | ORAL_TABLET | ORAL | Status: DC
  Administered 2016-04-07 – 2016-04-14 (×3): 1000 ug via ORAL
  Filled 2016-04-04 (×3): qty 1

## 2016-04-04 MED ORDER — TAMSULOSIN HCL 0.4 MG PO CAPS
0.4000 mg | ORAL_CAPSULE | Freq: Every day | ORAL | Status: DC
  Administered 2016-04-05 – 2016-04-14 (×10): 0.4 mg via ORAL
  Filled 2016-04-04 (×10): qty 1

## 2016-04-04 MED ORDER — ACETAMINOPHEN 500 MG PO TABS: 1000.0000 mg | ORAL_TABLET | Freq: Four times a day (QID) | ORAL | Status: DC | PRN

## 2016-04-04 MED ORDER — VITAMIN D 1000 UNITS PO TABS
1000.0000 [IU] | ORAL_TABLET | Freq: Every day | ORAL | Status: DC
  Filled 2016-04-04: qty 1

## 2016-04-04 MED ORDER — SODIUM CHLORIDE 0.9% FLUSH
3.0000 mL | Freq: Two times a day (BID) | INTRAVENOUS | Status: DC
  Administered 2016-04-04 – 2016-04-09 (×8): 3 mL via INTRAVENOUS
  Administered 2016-04-10: 09:00:00 via INTRAVENOUS
  Administered 2016-04-10 – 2016-04-11 (×2): 3 mL via INTRAVENOUS
  Administered 2016-04-12: 10:00:00 via INTRAVENOUS
  Administered 2016-04-12 – 2016-04-14 (×3): 3 mL via INTRAVENOUS

## 2016-04-04 MED ORDER — ONDANSETRON HCL 4 MG PO TABS: 4.0000 mg | ORAL_TABLET | Freq: Four times a day (QID) | ORAL | Status: DC | PRN

## 2016-04-04 MED ORDER — METOPROLOL TARTRATE 50 MG PO TABS
50.0000 mg | ORAL_TABLET | Freq: Two times a day (BID) | ORAL | Status: DC
  Filled 2016-04-04: qty 1

## 2016-04-04 MED ORDER — ONDANSETRON HCL 4 MG/2ML IJ SOLN: 4.0000 mg | Freq: Four times a day (QID) | INTRAMUSCULAR | Status: DC | PRN

## 2016-04-04 MED ORDER — VITAMIN B-12 1000 MCG PO TABS: 1000.0000 ug | ORAL_TABLET | ORAL | Status: DC

## 2016-04-04 MED ORDER — DILTIAZEM HCL ER COATED BEADS 120 MG PO CP24
120.0000 mg | ORAL_CAPSULE | Freq: Every day | ORAL | Status: DC
  Administered 2016-04-05 – 2016-04-14 (×10): 120 mg via ORAL
  Filled 2016-04-04 (×10): qty 1

## 2016-04-04 MED ORDER — SODIUM CHLORIDE 0.9 % IV SOLN
INTRAVENOUS | Status: DC
  Administered 2016-04-04: 22:00:00 via INTRAVENOUS

## 2016-04-04 NOTE — ED Notes (Signed)
Delay in transport; neurologist at bedside, then swallow screen to be completed

## 2016-04-04 NOTE — ED Triage Notes (Signed)
Onset 2-3 days numbness in bilateral legs, from below knees to toes.  + pedal pulses.  No shortness of breath, chest pain.  Pt has fell x 2 since numbness started, last fall today.  No injuries d/t falls.  Pt uses walker.

## 2016-04-04 NOTE — Progress Notes (Signed)
[place] for warfarin Indication: atrial fibrillation and CVA  Allergies  Allergen Reactions  . Asa [Aspirin] Other (See Comments)    Severe skin bruising   . Immune Globulins Other (See Comments)    Tetanus Immunes Globulins    (  Pt. Cannot take the Flu shot ) [name]     Patient Measurements: Height: 5\' 11"  (180.3 cm) Weight: 215 lb (97.5 kg) IBW/kg (Calculated) : 75.3   Vital Signs: Temp: 97.7 F (36.5 C) (11/19 2218) Temp Source: Oral (11/19 2218) BP: 142/53 (11/19 2218) Pulse Rate: 61 (11/19 2218)  Labs:  Recent Labs  04/04/16 1848  HGB 14.1  HCT 44.4  PLT 248  LABPROT 26.0*  INR 2.33  CREATININE 1.57*    Estimated Creatinine Clearance: 48.4 mL/min (by C-G formula based on SCr of 1.57 mg/dL (H)).   Medical History: Past Medical History:  Diagnosis Date  . Atrial fibrillation ([place]) 02/2016   NEW ONSET  . Brain tumor (benign) ([place]) 1998   2 small tumors- since 2015 (Dr. [name] following)  . Carotid artery occlusion   . Cerebrovascular disease September 09, 2001   TIA, Left brain  . CKD (chronic kidney disease), stage III   . Diverticulosis   . ED (erectile dysfunction)   . Guillain-Barre syndrome ([place]) 1999  . Hx of elevated lipids   . Hyperlipidemia   . Hypertension   . Memory loss   . Stroke [facility]) 2003   Assessment: 75 yo F on warfarin PTA for CVA and atrial fibrillation presents to ED with leg weakness in setting of Gilliam Barr syndrome. Warfarin to be held due to LP.   Goal of Therapy:  INR 2-3 Monitor platelets by anticoagulation protocol: Yes    Plan:  1. Warfarin on hold for LP; will follow up post procedure and resume when feasible 2. Daily INR   Vincenza Hews, PharmD, BCPS 04/04/2016, 10:32 PM Pager: [phone]

## 2016-04-04 NOTE — H&P (Signed)
History and Physical    [name] HQI:[id number] DOB: 1940-07-02 DOA: 04/04/2016  Referring MD/NP/PA: er PCP: Irven Shelling, MD Outpatient Specialists:  Patient coming from: home  Chief Complaint: trouble walking  HPI: [name] is a 75 y.o. male with medical history significant of 2 small brain tumors being followed by Dr. [name] with every 6 month MRI, history of Gilliam Barr's in 1999, two recent hospitalizations one for urinary tract infection, the other for cellulitis of his left knee.  He presents to the ER after developing numbness in both legs on Friday. Patient was walking the stairs with his physical therapist on Thursday and wife states on Friday he was having trouble walking and fell 2.  The numbness is from knees to his toes bilaterally. Patient denies any back pain. Patient has a Foley that was placed a month ago and has failed outpatient voiding trials. Patient reports no issues with his bowels.  No fevers no chills no nausea no vomiting no chest pain or shortness of breath  ED Course: Lab work was virtually unremarkable patient does have an increased WBC count. UA is still pending. Patient was seen in the ER along with neurology, condition appears to be compatible with another episode of Ethelene Hal. Plan is for patient to be started on IVIG, and LP to be obtained once INR is acceptable to interventional radiology, an MRI of lumbar spine.  PT consult  Review of Systems: all systems reviewed, negative unless stated above in HPI   Past Medical History:  Diagnosis Date  . Atrial fibrillation ([place]) 02/2016   NEW ONSET  . Brain tumor (benign) ([place]) 1998   2 small tumors- since 2015 (Dr. [name] following)  . Carotid artery occlusion   . Cerebrovascular disease September 09, 2001   TIA, Left brain  . CKD (chronic kidney disease), stage III   . Diverticulosis   . ED (erectile dysfunction)   . Guillain-Barre syndrome ([place]) 1999  . Hx of elevated lipids   .  Hyperlipidemia   . Hypertension   . Memory loss   . Stroke [facility]) 2003    Past Surgical History:  Procedure Laterality Date  . BRAIN SURGERY  1998   Benign brain tumor removed, Left hemisphere- ? Meningioma  . CAROTID ENDARTERECTOMY Left September 13, 2001   LEFT cea  . CHOLECYSTECTOMY  Nov. 2001  . COLON SURGERY  Feb. 2003   Colonic polyps removed endoscopically  . COLONOSCOPY WITH PROPOFOL N/A 11/11/2015   Procedure: COLONOSCOPY WITH PROPOFOL;  Surgeon: Garlan Fair, MD;  Location: [facility] END[facility];  Service: Endoscopy;  Laterality: N/A;  . INGUINAL HERNIA REPAIR  1970's     reports that he quit smoking about 19 years ago. His smoking use included Cigarettes. He has never used smokeless tobacco. He reports that he does not drink alcohol or use drugs.  Allergies  Allergen Reactions  . Asa [Aspirin] Other (See Comments)    Severe skin bruising   . Immune Globulins Other (See Comments)    Tetanus Immunes Globulins    (  Pt. Cannot take the Flu shot ) Guillian Barre     Family History  Problem Relation Age of Onset  . Colon cancer Mother     Metastatic   . Cancer Mother     Colon  . Stroke Father   . Diabetes Father      Prior to Admission medications   Medication Sig Start Date End Date Taking? Authorizing Provider  cholecalciferol (VITAMIN  D) 1000 units tablet Take 1,000 Units by mouth daily.    Historical Provider, MD  CRESTOR 40 MG tablet Take 40 mg by mouth daily.  06/19/12   Historical Provider, MD  diltiazem (CARDIZEM CD) 120 MG 24 hr capsule Take 1 capsule (120 mg total) by mouth daily. 03/08/16   Janece Canterbury, MD  furosemide (LASIX) 80 MG tablet Take 80 mg by mouth daily as needed for fluid or edema.    Lavone Orn, MD  metoprolol (LOPRESSOR) 50 MG tablet Take 1 tablet (50 mg total) by mouth 2 (two) times daily. 03/08/16   Janece Canterbury, MD  tamsulosin (FLOMAX) 0.4 MG CAPS capsule Take 1 capsule (0.4 mg total) by mouth daily after supper. 03/08/16   Janece Canterbury, MD  vitamin B-12 (CYANOCOBALAMIN) 1000 MCG tablet Take 1,000 mcg by mouth 2 (two) times a week.     Historical Provider, MD  warfarin (COUMADIN) 5 MG tablet Take 1 tablet (5 mg total) by mouth daily at 6 PM. 03/08/16 04/07/16  Janece Canterbury, MD    Physical Exam: Vitals:   04/04/16 1830 04/04/16 1900 04/04/16 2000 04/04/16 2100  BP: (!) 120/54 139/60 141/56 156/59  Pulse: (!) 54 (!) 52 (!) 54 (!) 57  Resp: 18 19 20 22   Temp:      TempSrc:      SpO2: 95% 95% 94% 95%  Weight:      Height:          Constitutional: NAD, calm, comfortable Vitals:   04/04/16 1830 04/04/16 1900 04/04/16 2000 04/04/16 2100  BP: (!) 120/54 139/60 141/56 156/59  Pulse: (!) 54 (!) 52 (!) 54 (!) 57  Resp: 18 19 20 22   Temp:      TempSrc:      SpO2: 95% 95% 94% 95%  Weight:      Height:       Eyes: PERRL, lids and conjunctivae normal ENMT: Mucous membranes are moist. Posterior pharynx clear of any exudate or lesions.Normal dentition.  Neck: normal, supple, no masses, no thyromegaly Respiratory: clear to auscultation bilaterally, no wheezing, no crackles. Normal respiratory effort. No accessory muscle use.  Cardiovascular: Regular rate and rhythm, no murmurs / rubs / gallops. No extremity edema. 2+ pedal pulses. No carotid bruits.  Abdomen: no tenderness, no masses palpated. No hepatosplenomegaly. Bowel sounds positive.  Musculoskeletal: no clubbing / cyanosis. No joint deformity upper and lower extremities. Good ROM, no contractures. Normal muscle tone.  Skin: no rashes, lesions, ulcers. No induration Neurologic: weakness in LE with no reflexes-- sensation impaired, gait impaired Psychiatric: Normal judgment and insight. Alert and oriented x 3. Normal mood.    Labs on Admission: I have personally reviewed following labs and imaging studies  CBC:  Recent Labs Lab 04/04/16 1848  WBC 14.7*  NEUTROABS 11.2*  HGB 14.1  HCT 44.4  MCV 89.5  PLT 536   Basic Metabolic Panel:  Recent  Labs Lab 04/04/16 1848  NA 141  K 4.2  CL 106  CO2 25  GLUCOSE 117*  BUN 14  CREATININE 1.57*  CALCIUM 9.1  MG 2.3   GFR: Estimated Creatinine Clearance: 48.7 mL/min (by C-G formula based on SCr of 1.57 mg/dL (H)). Liver Function Tests: No results for input(s): AST, ALT, ALKPHOS, BILITOT, PROT, ALBUMIN in the last 168 hours. No results for input(s): LIPASE, AMYLASE in the last 168 hours. No results for input(s): AMMONIA in the last 168 hours. Coagulation Profile:  Recent Labs Lab 04/04/16 1848  INR 2.33  Cardiac Enzymes: No results for input(s): CKTOTAL, CKMB, CKMBINDEX, TROPONINI in the last 168 hours. BNP (last 3 results) No results for input(s): PROBNP in the last 8760 hours. HbA1C: No results for input(s): HGBA1C in the last 72 hours. CBG: No results for input(s): GLUCAP in the last 168 hours. Lipid Profile: No results for input(s): CHOL, HDL, LDLCALC, TRIG, CHOLHDL, LDLDIRECT in the last 72 hours. Thyroid Function Tests: No results for input(s): TSH, T4TOTAL, FREET4, T3FREE, THYROIDAB in the last 72 hours. Anemia Panel: No results for input(s): VITAMINB12, FOLATE, FERRITIN, TIBC, IRON, RETICCTPCT in the last 72 hours. Urine analysis:    Component Value Date/Time   COLORURINE YELLOW 03/06/2016 1950   APPEARANCEUR CLOUDY (A) 03/06/2016 1950   LABSPEC 1.015 03/06/2016 1950   PHURINE 5.5 03/06/2016 1950   GLUCOSEU NEGATIVE 03/06/2016 1950   HGBUR MODERATE (A) 03/06/2016 [place] NEGATIVE 03/06/2016 [place] NEGATIVE 03/06/2016 1950   PROTEINUR 30 (A) 03/06/2016 1950   NITRITE NEGATIVE 03/06/2016 1950   LEUKOCYTESUR TRACE (A) 03/06/2016 1950   Sepsis Labs: Invalid input(s): PROCALCITONIN, LACTICIDVEN No results found for this or any previous visit (from the past 240 hour(s)).   Radiological Exams on Admission: No results found.  EKG: Independently reviewed. Sinus brady  Assessment/Plan Active Problems:   Renal insufficiency    History of CVA (cerebrovascular accident)   Acute urinary retention   Lower extremity weakness   Guillain Barr syndrome (HCC)   Ethelene Hal syndrome Seen by neurology who recommended starting IVIG (listed as allergy but to tetanus immune globulin-- wife does not remember any reaction in 1999 to immune globulins-- may need to call PCP and see if he has any records)-- if unable to do IVIG then will need catheter and get plasmaphoresis will need LP-currently on Coumadin will hold and get daily INR NIF and vital capacity per respiratory -will need outpatient EMG -unable to do MRI due to surgical clip in abdomen  Acute urinary retention -voiding trial before discharge  LE weakness -PT consult when improved  CKD -GFR between 40-50 -await U/A from today  leukocytosis -await U/A -trend  DVT prophylaxis: on coumadin Code Status: full Family Communication: wife at bedside Disposition Plan: inpt for 5 days of IVIG Consults called: neuro Admission status: inpt   Gravity DO [facility] Pager [phone]  If 7PM-7AM, please contact night-coverage [email or web address] Password [facility]  04/04/2016, 9:44 PM

## 2016-04-04 NOTE — ED Notes (Signed)
Pt from home for BLE numbness x 3 days. Pt has pitting edema bilaterally. Has recently had an increase in Lasix dosage in which edema in BLE has improved. Pt usually ambulates with a walker at home but has had two falls due to weakness and numbness. Initial NIH 1 for sensory, pt appears generally weak in lower extremities. Wife is at bedside. Pt A&Ox4. Neurologist at bedside

## 2016-04-04 NOTE — ED Notes (Signed)
Dr. Lindzen at bedside. 

## 2016-04-04 NOTE — ED Notes (Signed)
Hospitalist at bedside 

## 2016-04-04 NOTE — ED Provider Notes (Signed)
[place] DEPT Provider Note   CSN: [id number] Arrival date & time: 04/04/16  1652     History   Chief Complaint Chief Complaint  Patient presents with  . Numbness    HPI Khalin Royce is a 75 y.o. male.  Patient with history of atrial fibrillation, benign brain tumor, caliber a, hypertension, stroke presents with lower extremity numbness for the past 2-3 days. Numbness and weakness below the knees. No history of peripheral neuropathy. Patient has had a stroke before. Patient denies any fevers chills or back pain. No history of back surgeries.  Patient is had recent admissions and Foley catheter placement. Patient has been using increased Lasix to 2 leg edema that has improved.      Past Medical History:  Diagnosis Date  . Atrial fibrillation ([place]) 02/2016   NEW ONSET  . Brain tumor (benign) [facility]) March 2015   2 small tumors  . Carotid artery occlusion   . Cerebrovascular disease September 09, 2001   TIA, Left brain  . CKD (chronic kidney disease), stage III   . Diverticulosis   . ED (erectile dysfunction)   . Guillain-Barre syndrome ([place]) 1999  . Hx of elevated lipids   . Hyperlipidemia   . Hypertension   . Memory loss   . Stroke [facility]) 2003    Patient Active Problem List   Diagnosis Date Noted  . Lower extremity weakness 04/04/2016  . Cellulitis of left knee 03/12/2016  . Atrial fibrillation, new onset ([place]) 03/07/2016  . Urinary tract infection without hematuria 03/07/2016  . Renal insufficiency 03/07/2016  . Hypokalemia 03/07/2016  . History of CVA (cerebrovascular accident) 03/07/2016  . HTN (hypertension) 03/07/2016  . Atrial fibrillation ([place]) 03/07/2016  . Acute urinary retention   . Occlusion and stenosis of carotid artery without mention of cerebral infarction 07/11/2012  . Carotid stenosis 07/11/2012    Past Surgical History:  Procedure Laterality Date  . BRAIN SURGERY  1998   Benign brain tumor removed, Left hemisphere- ? Meningioma  .  CAROTID ENDARTERECTOMY Left September 13, 2001   LEFT cea  . CHOLECYSTECTOMY  Nov. 2001  . COLON SURGERY  Feb. 2003   Colonic polyps removed endoscopically  . COLONOSCOPY WITH PROPOFOL N/A 11/11/2015   Procedure: COLONOSCOPY WITH PROPOFOL;  Surgeon: Garlan Fair, MD;  Location: [facility];  Service: Endoscopy;  Laterality: N/A;  . INGUINAL HERNIA REPAIR  1970's       Home Medications    Prior to Admission medications   Medication Sig Start Date End Date Taking? Authorizing Provider  cholecalciferol (VITAMIN D) 1000 units tablet Take 1,000 Units by mouth daily.    Historical Provider, MD  CRESTOR 40 MG tablet Take 40 mg by mouth daily.  06/19/12   Historical Provider, MD  diltiazem (CARDIZEM CD) 120 MG 24 hr capsule Take 1 capsule (120 mg total) by mouth daily. 03/08/16   Janece Canterbury, MD  furosemide (LASIX) 80 MG tablet Take 80 mg by mouth daily as needed for fluid or edema.    Lavone Orn, MD  metoprolol (LOPRESSOR) 50 MG tablet Take 1 tablet (50 mg total) by mouth 2 (two) times daily. 03/08/16   Janece Canterbury, MD  tamsulosin (FLOMAX) 0.4 MG CAPS capsule Take 1 capsule (0.4 mg total) by mouth daily after supper. 03/08/16   Janece Canterbury, MD  vitamin B-12 (CYANOCOBALAMIN) 1000 MCG tablet Take 1,000 mcg by mouth 2 (two) times a week.     Historical Provider, MD  warfarin (COUMADIN) 5 MG  tablet Take 1 tablet (5 mg total) by mouth daily at 6 PM. 03/08/16 04/07/16  Janece Canterbury, MD    Family History Family History  Problem Relation Age of Onset  . Colon cancer Mother     Metastatic   . Cancer Mother     Colon  . Stroke Father   . Diabetes Father     Social History Social History  Substance Use Topics  . Smoking status: Former Smoker    Types: Cigarettes    Quit date: 05/17/1996  . Smokeless tobacco: Never Used  . Alcohol use No     Allergies   Asa [aspirin] and Immune globulins   Review of Systems Review of Systems  Constitutional: Negative for chills and  fever.  HENT: Negative for congestion.   Eyes: Negative for visual disturbance.  Respiratory: Negative for shortness of breath.   Cardiovascular: Negative for chest pain.  Gastrointestinal: Negative for abdominal pain and vomiting.  Genitourinary: Negative for dysuria and flank pain.  Musculoskeletal: Negative for back pain, neck pain and neck stiffness.  Skin: Negative for rash.  Neurological: Positive for weakness and numbness. Negative for light-headedness and headaches.     Physical Exam Updated Vital Signs BP 139/60   Pulse (!) 52   Temp 97.8 F (36.6 C) (Oral)   Resp 19   Ht 5\' 11"  (1.803 m)   Wt 218 lb (98.9 kg)   SpO2 95%   BMI 30.40 kg/m   Physical Exam  Constitutional: He appears well-developed and well-nourished.  HENT:  Head: Normocephalic and atraumatic.  Eyes: Conjunctivae are normal.  Neck: Neck supple.  Cardiovascular: Normal rate and regular rhythm.   No murmur heard. Pulmonary/Chest: Effort normal and breath sounds normal. No respiratory distress.  Abdominal: Soft. There is no tenderness.  Musculoskeletal: He exhibits no edema.  Neurological: He is alert.  Reflex Scores:      Patellar reflexes are 1+ on the right side and 1+ on the left side.      Achilles reflexes are 0 on the right side and 0 on the left side. Patient has decreased sensation to palpation below the knees bilateral. Patient has normal sensation otherwise. Patient feels generally weak in the lower extremities no focal deficits on exam. Patient has 5+ strength of flexion extension of hips knees and great toes.  Skin: Skin is warm and dry.  Psychiatric: He has a normal mood and affect.  Nursing note and vitals reviewed.    ED Treatments / Results  Labs (all labs ordered are listed, but only abnormal results are displayed) Labs Reviewed  CBC WITH DIFFERENTIAL/PLATELET - Abnormal; Notable for the following:       Result Value   WBC 14.7 (*)    Neutro Abs 11.2 (*)    Monocytes  Absolute 1.2 (*)    All other components within normal limits  BASIC METABOLIC PANEL - Abnormal; Notable for the following:    Glucose, Bld 117 (*)    Creatinine, Ser 1.57 (*)    GFR calc non Af Amer 41 (*)    GFR calc Af Amer 48 (*)    All other components within normal limits  PROTIME-INR - Abnormal; Notable for the following:    Prothrombin Time 26.0 (*)    All other components within normal limits  URINE CULTURE  MAGNESIUM  TSH  URINALYSIS, ROUTINE W REFLEX MICROSCOPIC (NOT AT [facility])    EKG  EKG Interpretation  Date/Time:  Sunday April 04 2016 18:41:06 EST Ventricular Rate:  57 PR Interval:    QRS Duration: 99 QT Interval:  433 QTC Calculation: 422 R Axis:   22 Text Interpretation:  Sinus rhythm Low voltage, precordial leads Confirmed by Ceyda Peterka MD, Vonna Kotyk ([id number]) on 04/04/2016 7:32:04 PM       Radiology No results found.  Procedures Procedures (including critical care time)  Medications Ordered in ED Medications - No data to display   Initial Impression / Assessment and Plan / ED Course  I have reviewed the triage vital signs and the nursing notes.  Pertinent labs & imaging results that were available during my care of the patient were reviewed by me and considered in my medical decision making (see chart for details).  Clinical Course    Patient presents with weakness and numbness bilateral lower extremities below the knees. Patient has had increased Lasix recently plan to check blood work for potassium. Patient has no unilateral deficits and normal cranial nerve exam. Other things on the differential include UTI/ general weakness, G Barr and other peripheral neuropathies.. If potassium is unremarkable plan for neurology consult. Patient stable in the emergency department Potassium unremarkable. Discussed with neurology for further advice on workup. Discussed with [facility] hospitalist for observation as patient cannot ambulate due to numbness and weakness in  lower extremities. The patients results and plan were reviewed and discussed.   Any x-rays performed were independently reviewed by myself.  UA pending Differential diagnosis were considered with the presenting HPI.  Medications - No data to display  Vitals:   04/04/16 1708 04/04/16 1800 04/04/16 1830 04/04/16 1900  BP:  140/57 (!) 120/54 139/60  Pulse:  (!) 55 (!) 54 (!) 52  Resp:   18 19  Temp:      TempSrc:      SpO2:  96% 95% 95%  Weight: 218 lb (98.9 kg)     Height: 5\' 11"  (1.803 m)       Final diagnoses:  Weakness of both lower extremities    Admission/ observation were discussed with the admitting physician, patient and/or family and they are comfortable with the plan.   Final Clinical Impressions(s) / ED Diagnoses   Final diagnoses:  Weakness of both lower extremities    New Prescriptions New Prescriptions   No medications on file     Elnora Morrison, MD 04/04/16 2041

## 2016-04-04 NOTE — Consult Note (Addendum)
NEURO HOSPITALIST CONSULT NOTE   Requestig physician: Dr. Eliseo Squires  Reason for Consult: Three day history of progressive lower extremity weakness and numbness  History obtained from:  Patient and Chart     HPI:                                                                                                                                          [name] is an 75 y.o. male who presents today with 2-3 day history of new onset bilateral lower extremity numbness and weakness. His PMHx includes prior CVA, left CEA, craniotomy for meningioma resection, GBS in 1999, HTN, HLD, CKD Stage 3 and recently diagnosed paroxysmal atrial fibrillation. He takes Coumadin at home. He has two recent admissions. The first was from 10/21-23 for urosepsis, at which time he also was diagnosed with new onset atrial fibrillation.  He was admitted again on 10/27 for left knee cellulitis. He has had some problems with lower extremity edema, recently treated with Lasix as an outpatient.   Onset of his lower extremity symptoms was 2-3 days ago, manifesting as sensory numbness in bilateral legs, from below knees to toes. He also has experienced worsening lower extremity weakness limiting his ambulation. ED evaluation revealed intact pedal pulses. He was not complaining of shortness of breath or chest pain.  Pt has fallen twice since the numbness started, his last fall occurring today. He has had no injuries due to the falls. The pateint uses a walker.   Past Medical History:  Diagnosis Date  . Atrial fibrillation ([place]) 02/2016   NEW ONSET  . Brain tumor (benign) [facility]) March 2015   2 small tumors  . Carotid artery occlusion   . Cerebrovascular disease September 09, 2001   TIA, Left brain  . CKD (chronic kidney disease), stage III   . Diverticulosis   . ED (erectile dysfunction)   . Guillain-Barre syndrome ([place]) 1999  . Hx of elevated lipids   . Hyperlipidemia   . Hypertension   . Memory loss    . Stroke [facility]) 2003    Past Surgical History:  Procedure Laterality Date  . BRAIN SURGERY  1998   Benign brain tumor removed, Left hemisphere- ? Meningioma  . CAROTID ENDARTERECTOMY Left September 13, 2001   LEFT cea  . CHOLECYSTECTOMY  Nov. 2001  . COLON SURGERY  Feb. 2003   Colonic polyps removed endoscopically  . COLONOSCOPY WITH PROPOFOL N/A 11/11/2015   Procedure: COLONOSCOPY WITH PROPOFOL;  Surgeon: Garlan Fair, MD;  Location: [facility] ENDOSCOPY;  Service: Endoscopy;  Laterality: N/A;  . INGUINAL HERNIA REPAIR  1970's    Family History  Problem Relation Age of Onset  . Colon cancer Mother     Metastatic   . Cancer Mother  Colon  . Stroke Father   . Diabetes Father     Social History:  reports that he quit smoking about 19 years ago. His smoking use included Cigarettes. He has never used smokeless tobacco. He reports that he does not drink alcohol or use drugs.  Allergies  Allergen Reactions  . Asa [Aspirin]     Severe skin bruising   . Immune Globulins Other (See Comments)    Tetanus Immunes Globulins    (  Pt. Cannot take the Flu shot ) [facility] MEDICATIONS:                                                                                                                      acetaminophen (TYLENOL) 500 MG tablet Take 1,000 mg by mouth every 6 (six) hours as needed for headache (pain). Geradine Girt, DO Reordered  Orderedas:acetaminophen (TYLENOL) tablet 1,000 mg - 1,000 mg, Oral, Every 6 hours PRN, headache, pain, Starting Sun 04/04/16 at 2209  cholecalciferol (VITAMIN D) 1000 units tablet Take 1,000 Units by mouth daily. Geradine Girt, DO Reordered  Orderedas:cholecalciferol (VITAMIN D) tablet 1,000 Units - 1,000 Units, Oral, Daily, First dose on Mon 04/05/16 at 1000  diltiazem (CARDIZEM CD) 120 MG 24 hr capsule Take 1 capsule (120 mg total) by mouth daily. Geradine Girt, DO Reordered  Orderedas:diltiazem (CARDIZEM CD) 24 hr capsule 120 mg -  120 mg, Oral, Daily, First dose on Mon 04/05/16 at 1000  furosemide (LASIX) 80 MG tablet Take 40-80 mg by mouth daily as needed for fluid or edema (weight gain of 2 lbs in 24 hours or 5 lbs in a week).  Geradine Girt, DO Not Ordered  metoprolol (LOPRESSOR) 50 MG tablet Take 1 tablet (50 mg total) by mouth 2 (two) times daily. Geradine Girt, DO Reordered  Orderedas:metoprolol (LOPRESSOR) tablet 50 mg - 50 mg, Oral, 2 times daily, First dose on Sun 04/04/16 at 2215  rosuvastatin (CRESTOR) 40 MG tablet Take 40 mg by mouth daily. Geradine Girt, DO Reordered  Orderedas:rosuvastatin (CRESTOR) tablet 40 mg - 40 mg, Oral, Daily-1800, First dose on Mon 04/05/16 at 1800  tamsulosin (FLOMAX) 0.4 MG CAPS capsule Take 1 capsule (0.4 mg total) by mouth daily after supper. Geradine Girt, DO Reordered  Orderedas:tamsulosin [facility]) capsule 0.4 mg - 0.4 mg, Oral, Daily after supper, First dose on Mon 04/05/16 at 1800  vitamin B-12 (CYANOCOBALAMIN) 1000 MCG tablet Take 1,000 mcg by mouth 2 (two) times a week. Wednesday and Saturday Geradine Girt, DO Not Ordered  Orderedas:vitamin B-12 (CYANOCOBALAMIN) tablet 1,000 mcg - 1,000 mcg, Oral, 2 times weekly (Once per day on Mon Thu), First dose on Mon 04/05/16 at 0900 (Discontinued)  warfarin (COUMADIN) 5 MG tablet        ROS:  History obtained from patient. Denies headache, chest pain, abdominal pain or limb pain. Other ROS as per HPI.    Blood pressure 139/60, pulse (!) 52, temperature 97.8 F (36.6 C), temperature source Oral, resp. rate 19, height 5\' 11"  (1.803 m), weight 98.9 kg (218 lb), SpO2 95 %.   General Examination:                                                                                                      HEENT-  Normocephalic/atraumatic.  Lungs: Respirations unlabored Extremities- Mild edema below  knees bilaterally.   Neurological Examination Mental Status: Alert, oriented, thought content appropriate without agitation. Speech fluent. Comprehension intact but with mild cognitive slowing. Mild naming deficit. One error with repetition. Had some difficulty with a complex motor command. Memory: relies on his wife to clarify portions of his past medical history.  Cranial Nerves: II: Visual fields intact to confrontation testing, pupils equal, round and reactive to light  III,IV, VI: ptosis not present, extra-ocular motions intact bilaterally, with saccadic quality to his visual pursuits. No nystagmus.  V,VII: Mild hypomimia. Smile symmetric, facial temperature sensation normal bilaterally VIII: hearing intact to conversation IX,X: palate rises symmetrically XI: bilateral shoulder shrug is equal XII: midline tongue extension Motor: RUE 5/5  RLE 4/5 hip flexion, knee extension/flexion, ankle dorsi/plantar flexion  LUE 5/5  LLE 4-/5 hip flexion, knee extension/flexion, ankle dorsi/plantar flexion Decreased tone bilateral lower extremities. Normal tone upper extremities. No atrophy noted Sensory: Temperature and light touch intact in upper extremities.  Lower extremities with normal fine touch sensation proximally, decreased FT circumferentially below knees bilaterally, worse distally. Temperature sensation "normal" per patient to thighs, legs and feet bilaterally. Severe proprioceptive loss toes bilaterally. Moderate proprioceptive loss ankles bilaterally.  Deep Tendon Reflexes: 0 patellae and achilles bilaterally. Hypoactive 1+ bilateral brachioradialis and triceps. Absent biceps reflexes bilaterally.  Plantars: Right: downgoing   Left: downgoing Cerebellar: Subtle dyssinergia and dysmetria with FNF bilaterally. Subtle ataxia with heel-shin bilaterally.  Gait: Difficulty standing with own power, requires support, small steps with rapid fatiguing and unsteadiness. Gives way at knees which  worsens as he walks. Able to tolerate about 20 seconds of ambulation and could traverse only 5 feet.   Lab Results: Basic Metabolic Panel:  Recent Labs Lab 04/04/16 1848  NA 141  K 4.2  CL 106  CO2 25  GLUCOSE 117*  BUN 14  CREATININE 1.57*  CALCIUM 9.1  MG 2.3    Liver Function Tests: No results for input(s): AST, ALT, ALKPHOS, BILITOT, PROT, ALBUMIN in the last 168 hours. No results for input(s): LIPASE, AMYLASE in the last 168 hours. No results for input(s): AMMONIA in the last 168 hours.  CBC:  Recent Labs Lab 04/04/16 1848  WBC 14.7*  NEUTROABS 11.2*  HGB 14.1  HCT 44.4  MCV 89.5  PLT 248    Cardiac Enzymes: No results for input(s): CKTOTAL, CKMB, CKMBINDEX, TROPONINI in the last 168 hours.  Lipid Panel: No results for input(s): CHOL, TRIG, HDL, CHOLHDL, VLDL, LDLCALC in the last 168 hours.  CBG: No results for  input(s): GLUCAP in the last 168 hours.  Microbiology: Results for orders placed or performed during the hospital encounter of 03/12/16  Culture, blood (Routine X 2) w Reflex to ID Panel     Status: None   Collection Time: 03/13/16  4:25 AM  Result Value Ref Range Status   Specimen Description BLOOD RIGHT ARM  Final   Special Requests BOTTLES DRAWN AEROBIC AND ANAEROBIC 5ML  Final   Culture NO GROWTH 5 DAYS  Final   Report Status 03/18/2016 FINAL  Final  Culture, blood (Routine X 2) w Reflex to ID Panel     Status: None   Collection Time: 03/13/16  4:47 AM  Result Value Ref Range Status   Specimen Description BLOOD RIGHT HAND  Final   Special Requests BOTTLES DRAWN AEROBIC AND ANAEROBIC 5ML  Final   Culture NO GROWTH 5 DAYS  Final   Report Status 03/18/2016 FINAL  Final    Coagulation Studies:  Recent Labs  04/04/16 1848  LABPROT 26.0*  INR 2.33    Imaging: No results found.  Assessment: 1. Three day history of progressive lower extremity weakness and sensory numbness in an ascending pattern. Exam reveals significant lower  extremity weakness, lower extremity areflexia and upper extremity hyporeflexia. No signs/symptoms of respiratory distress noted. Highest component of the DDx is recurrent episode of AIDP (GBS). Less likely would be cauda equina compression (no back pain or incontinence) or infiltrative process (epidemiologically less likely and no history of malignancy - meningioma is a benign tumor). Not likely tick paralysis as no history of recent tick bite. Not likely a myelopathy as there are no UMN signs on exam.  2. New onset atrial fibrillation, on Coumadin. INR is 2.33. 3. Leukocytosis.   Recommendations: 1. Respiratory therapy consult for q4h FVC and NIF. If FVC drops below 20 ml/kg, will need transfer to MICU.  2. Unable to obtain MRI lumbar spine due to surgical clip in colon. Will obtain CT of lumbar spine.  3. Fluoroscopically guided lumbar puncture to assess for albuminocytologic dissociation.  4. Best option is IVIG as soon as possible. However, an allergy to immune globulin is listed in EPIC. Unclear if this is a true allergy to IVIG as documentation on the listed allergy states "Tetanus Immunes Globulins ( Pt. Cannot take the Flu shot )". This will need to be clarified with pharmacy and/or his PCP first thing in the morning.  5. Plasma exchange is another option, but the patient would need to be transitioned from Coumadin to IV heparin in order for plasmapheresis catheter to be placed. If he has a definite allergy to IVIG, this may be the best option.   6. PT/OT/Speech.  7. Monitor sodium levels. A subset of AIDP patients also develop SIADH - the underlying physiology of this relationship is unknown.    Electronically signed: Dr. Kerney Elbe 04/04/2016, 8:26 PM

## 2016-04-05 ENCOUNTER — Inpatient Hospital Stay (HOSPITAL_COMMUNITY): Payer: PPO

## 2016-04-05 DIAGNOSIS — G610 Guillain-Barre syndrome: Secondary | ICD-10-CM | POA: Diagnosis not present

## 2016-04-05 DIAGNOSIS — Z8673 Personal history of transient ischemic attack (TIA), and cerebral infarction without residual deficits: Secondary | ICD-10-CM | POA: Diagnosis not present

## 2016-04-05 DIAGNOSIS — M48061 Spinal stenosis, lumbar region without neurogenic claudication: Secondary | ICD-10-CM | POA: Diagnosis not present

## 2016-04-05 DIAGNOSIS — M5126 Other intervertebral disc displacement, lumbar region: Secondary | ICD-10-CM | POA: Diagnosis not present

## 2016-04-05 DIAGNOSIS — N289 Disorder of kidney and ureter, unspecified: Secondary | ICD-10-CM | POA: Diagnosis not present

## 2016-04-05 DIAGNOSIS — R29898 Other symptoms and signs involving the musculoskeletal system: Secondary | ICD-10-CM | POA: Diagnosis not present

## 2016-04-05 DIAGNOSIS — D320 Benign neoplasm of cerebral meninges: Secondary | ICD-10-CM | POA: Diagnosis not present

## 2016-04-05 LAB — BASIC METABOLIC PANEL
ANION GAP: 6 (ref 5–15)
BUN: 12 mg/dL (ref 6–20)
CALCIUM: 8.7 mg/dL — AB (ref 8.9–10.3)
CO2: 27 mmol/L (ref 22–32)
Chloride: 109 mmol/L (ref 101–111)
Creatinine, Ser: 1.48 mg/dL — ABNORMAL HIGH (ref 0.61–1.24)
GFR calc non Af Amer: 45 mL/min — ABNORMAL LOW (ref >60)
GFR, EST AFRICAN AMERICAN: 52 mL/min — AB (ref >60)
Glucose, Bld: 104 mg/dL — ABNORMAL HIGH (ref 65–99)
Potassium: 4.2 mmol/L (ref 3.5–5.1)
Sodium: 142 mmol/L (ref 135–145)

## 2016-04-05 LAB — CBC
HEMATOCRIT: 41.1 % (ref 39.0–52.0)
HEMOGLOBIN: 12.9 g/dL — AB (ref 13.0–17.0)
MCH: 28.3 pg (ref 26.0–34.0)
MCHC: 31.4 g/dL (ref 30.0–36.0)
MCV: 90.1 fL (ref 78.0–100.0)
Platelets: 238 10*3/uL (ref 150–400)
RBC: 4.56 MIL/uL (ref 4.22–5.81)
RDW: 13.7 % (ref 11.5–15.5)
WBC: 14.7 10*3/uL — AB (ref 4.0–10.5)

## 2016-04-05 LAB — PROTIME-INR
INR: 2.23
Prothrombin Time: 25 seconds — ABNORMAL HIGH (ref 11.4–15.2)

## 2016-04-05 LAB — VITAMIN B12: Vitamin B-12: 357 pg/mL (ref 180–914)

## 2016-04-05 MED ORDER — GADOBENATE DIMEGLUMINE 529 MG/ML IV SOLN
20.0000 mL | Freq: Once | INTRAVENOUS | Status: AC | PRN
  Administered 2016-04-05: 20 mL via INTRAVENOUS

## 2016-04-05 MED ORDER — ACETAMINOPHEN 325 MG PO TABS
650.0000 mg | ORAL_TABLET | Freq: Four times a day (QID) | ORAL | Status: DC | PRN
  Administered 2016-04-10 – 2016-04-12 (×3): 650 mg via ORAL
  Filled 2016-04-05 (×3): qty 2

## 2016-04-05 MED ORDER — SODIUM CHLORIDE 0.9 % IV BOLUS (SEPSIS)
500.0000 mL | Freq: Once | INTRAVENOUS | Status: AC
  Administered 2016-04-05: 500 mL via INTRAVENOUS

## 2016-04-05 NOTE — Progress Notes (Signed)
[facility] Progress Note  Patient: [name] YQI:[id number]   PCP: Irven Shelling, MD DOB: 1940-11-07   DOA: 04/04/2016   DOS: 04/05/2016   Date of Service: the patient was seen and examined on 04/05/2016  Brief hospital course: Pt. with PMH of CKD, CVA, history of GBS, A. fib on chronic anti-coagulation, meningioma, HTN, HLD; admitted on 04/04/2016, with complaint of recurrent fall, was found to have generalized weakness versus suspected GBS. Currently further plan is continue close monitoring and await further workup for suspected GBS.  Assessment and Plan: 1. Recurrent fall. Suspected GBS. Neurology consulted. Patient suspected of having GBS. Unable to perform lumbar puncture due to anticoagulation. MRI brain shows chronic occlusion of the right ICA and chronic left hemispheric and to follow malacia underlying craniotomy site. It also shows mildly enlarged meningioma. No active bleeding or acute abnormality identified on the CT of the head. MRI lumbar spine also unremarkable. Continue to monitor NIF Family was not willing to try IVIG empirically until definitive diagnosis is obtained since the patient has history of volume overload with IVIG in the past. No allergic reaction identified with IVIG per family.  2. A. fib. Chronic anticoagulation. Hypertension.  Currently holding Coumadin.  On metoprolol which is also currently on hold. Continue Cardizem. Orthostatic vitals are normal.  3. Chronic kidney disease. Stage III. Continue to closely monitor.  4. Acute urinary retention. We will initiate the patient on Flomax.  Pain management: When necessary Tylenol Activity: Consulted physical therapy Bowel regimen: last BM prior to admission Diet: Cardiac diet DVT Prophylaxis: mechanical compression device and on therapeutic anticoagulation.  Advance goals of care discussion: Full code  Family Communication: family was present at bedside, at the time of  interview. The pt provided permission to discuss medical plan with the family. Opportunity was given to ask question and all questions were answered satisfactorily.   Disposition:  Discharge to home. Expected discharge date: 04/09/2016,  Consultants: Neurology Procedures: None  Antibiotics: Anti-infectives    None        Subjective: Denies any acute complaint. No dizziness or lightheadedness. No focal deficit. No chest pain.  Objective: Physical Exam: Vitals:   04/04/16 2100 04/04/16 2218 04/05/16 0148 04/05/16 0532  BP: 156/59 (!) 142/53 (!) 149/56 (!) 142/52  Pulse: (!) 57 61 (!) 58 (!) 58  Resp: 22 20 20 20   Temp:  97.7 F (36.5 C) 97.9 F (36.6 C) 97.8 F (36.6 C)  TempSrc:  Oral Oral Oral  SpO2: 95% 98% 95% 96%  Weight:  97.5 kg (215 lb)    Height:  5\' 11"  (1.803 m)      Intake/Output Summary (Last 24 hours) at 04/05/16 1904 Last data filed at 04/05/16 1455  Gross per 24 hour  Intake                0 ml  Output              800 ml  Net             -800 ml   Filed Weights   04/04/16 1708 04/04/16 2218  Weight: 98.9 kg (218 lb) 97.5 kg (215 lb)    General: Alert, Awake and Oriented to Time, Place and Person. Appear in mild distress, affect appropriate Eyes: PERRL, Conjunctiva normal ENT: Oral Mucosa clear moist. Neck: difficult to assess JVD, no Abnormal Mass Or lumps Cardiovascular: S1 and S2 Present, no Murmur, Respiratory: Bilateral Air entry equal and Decreased, no use of accessory muscle,  Clear to Auscultation, no Crackles, no wheezes Abdomen: Bowel Sound present, Soft and no tenderness Skin: no redness, no Rash, no induration Extremities: no Pedal edema, no calf tenderness Neurologic: Grossly no focal neuro deficit. Bilateral lower ext weakness Data Reviewed: CBC:  Recent Labs Lab 04/04/16 1848 04/05/16 0543  WBC 14.7* 14.7*  NEUTROABS 11.2*  --   HGB 14.1 12.9*  HCT 44.4 41.1  MCV 89.5 90.1  PLT 248 379   Basic Metabolic  Panel:  Recent Labs Lab 04/04/16 1848 04/05/16 0543  NA 141 142  K 4.2 4.2  CL 106 109  CO2 25 27  GLUCOSE 117* 104*  BUN 14 12  CREATININE 1.57* 1.48*  CALCIUM 9.1 8.7*  MG 2.3  --     Liver Function Tests: No results for input(s): AST, ALT, ALKPHOS, BILITOT, PROT, ALBUMIN in the last 168 hours. No results for input(s): LIPASE, AMYLASE in the last 168 hours. No results for input(s): AMMONIA in the last 168 hours. Coagulation Profile:  Recent Labs Lab 04/04/16 1848 04/05/16 0543  INR 2.33 2.23   Cardiac Enzymes: No results for input(s): CKTOTAL, CKMB, CKMBINDEX, TROPONINI in the last 168 hours. BNP (last 3 results) No results for input(s): PROBNP in the last 8760 hours.  CBG: No results for input(s): GLUCAP in the last 168 hours.  Studies: Ct Head Wo Contrast  Result Date: 04/05/2016 CLINICAL DATA:  Falls, bilateral lower extremity numbness and weakness. EXAM: CT HEAD WITHOUT CONTRAST TECHNIQUE: Contiguous axial images were obtained from the base of the skull through the vertex without intravenous contrast. COMPARISON:  MR brain 10/29/2015. FINDINGS: Brain: No evidence of an acute infarct, acute hemorrhage or hydrocephalus. A 3.5 cm hyper attenuating mass along the inner table of the high right parietal bone is unchanged. Adjacent low-attenuation/edema in the right parietal deep white matter may be slightly increased from 10/29/2015. A smaller hyperattenuating lesion along the floor of the left anterior cranial fossa (series 201, image 10) was better seen on 10/29/2015. Mild atrophy. Vascular: No hyperdense vessel or unexpected calcification. Skull: Left frontoparietal craniectomy. Sinuses/Orbits: Tiny right mastoid effusion. Other: None. IMPRESSION: 1. 3.5 cm meningioma along the right parietal vertex with suspected increase in the extent of edema in the adjacent right parietal deep white matter. 2. No evidence of an acute infarct. 3. Left MCA territory encephalomalacia. 4.  Small meningioma along the floor of the left anterior cranial fossa, better seen on 10/29/2015. Electronically Signed   By: Lorin Picket M.D.   On: 04/05/2016 11:55   Mr Jeri Cos KW Contrast  Result Date: 04/05/2016 CLINICAL DATA:  75 year old male with 2 intracranial meningiomas under surveillance by neurosurgery. Recent hospitalization for UTI and left lower extremity infection. New lower extremity weakness. Initial encounter. EXAM: MRI HEAD WITHOUT AND WITH CONTRAST TECHNIQUE: Multiplanar, multiecho pulse sequences of the brain and surrounding structures were obtained without and with intravenous contrast. CONTRAST:  63mL MULTIHANCE GADOBENATE DIMEGLUMINE 529 MG/ML IV SOLN COMPARISON:  Restaging Brain MRI 10/29/2015 and earlier. Noncontrast head CT 1100 hours today. FINDINGS: Brain: Sequelae of left craniectomy with underlying left superior frontal gyrus, insula, and operculum encephalomalacia. Solid lobulated right paracentral posterior vertex meningioma encompasses 46 by 29 by 26 mm (AP by transverse by CC) versus 44 by 26 by 23 mm in June. Mild regional mass effect on the medial right parietal and posterior right superior frontal gyrus has not significantly changed. However, underlying white matter edema has progressed (series 7, image 20 today versus series 7, image 18 previously)  and appears to affect the motor strip white matter. The second meningioma along the left sphenoid planum at the anterior clinoid process is round and measures 21 by 20 x 13 mm (AP by transverse by CC) and is stable. Stable mass effect on the inferior left frontal gyrus there with no definite associated cerebral edema. Elsewhere stable gray and white matter signal. No restricted diffusion to suggest acute ischemia. Stable ventricle size and configuration. No acute intracranial mass effect. No acute intracranial hemorrhage identified. Negative pituitary and cervicomedullary junction. Vascular: Major intracranial vascular flow  voids are stable since June, including evidence of chronic occlusion of the right ICA, and superior sagittal venous sinus at the posterior vertex (series 6, images 9 and 23 respectively). Skull and upper cervical spine: Negative. Normal bone marrow signal. Sinuses/Orbits: Visualized paranasal sinuses and mastoids are stable and well pneumatized. Stable and negative orbits soft tissues. Other: No acute scalp soft tissue findings. Visible internal auditory structures appear normal. IMPRESSION: 1. Mildly enlarged posterior right paracentral vertex meningioma since June, now 46 x 29 x 26 mm (versus 44 x 26 x 23 mm). No significant change in regional mass effect but underlying white matter edema has increased and appears to affect the medial motor strip. 2. Left anterior clinoid process region 2 cm meningioma has not significantly changed. No associated edema. 3. 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 Chronic occlusion of the right ICA. Chronic left hemisphere encephalomalacia underlying the craniotomy site craniectomy site. Electronically Signed   By: Genevie Ann M.D.   On: 04/05/2016 16:31   Mr Lumbar Spine Wo Contrast  Result Date: 04/05/2016 CLINICAL DATA:  Numbness in the knees to the toes.  No back pain.  * EXAM: MRI LUMBAR SPINE WITHOUT CONTRAST TECHNIQUE: Multiplanar, multisequence MR imaging of the lumbar spine was performed. No intravenous contrast was administered. COMPARISON:  None. FINDINGS: Segmentation:  Standard. Alignment:  Physiologic. Vertebrae:  No fracture, evidence of discitis, or bone lesion. Conus medullaris: Extends to the L1 level and appears normal. Paraspinal and other soft tissues: No paraspinal abnormality. Partially visualize is a 4.7 cm right renal mass consistent with a cyst. Disc levels: Disc spaces: Degenerative disc disease mild disc height loss at L4-5. T12-L1: No significant disc bulge. No evidence of neural foraminal stenosis. No central  canal stenosis. L1-L2: No significant disc bulge. No evidence of neural foraminal stenosis. No central canal stenosis. L2-L3: No significant disc bulge. No evidence of neural foraminal stenosis. No central canal stenosis. L3-L4: Minimal broad-based disc bulge. Mild bilateral facet arthropathy. Mild bilateral lateral recess stenosis. No foraminal stenosis. Mild central canal narrowing. L4-L5: Broad-based disc osteophyte complex. Bilateral moderate facet arthropathy. Mild spinal stenosis and bilateral lateral recess stenosis. No foraminal stenosis. L5-S1: Minimal broad-based disc bulge. Moderate bilateral facet arthropathy. No evidence of neural foraminal stenosis. No central canal stenosis. IMPRESSION: 1. At L3-4 there is minimal broad-based disc bulge. Mild bilateral facet arthropathy. Mild bilateral lateral recess stenosis. Mild central canal narrowing. 2. At L4-5 there is a broad-based disc osteophyte complex. Bilateral moderate facet arthropathy. Mild spinal stenosis and bilateral lateral recess stenosis. 3. At L5-S1 there is minimal broad-based disc bulge. Moderate bilateral facet arthropathy. Electronically Signed   By: Kathreen Devoid   On: 04/05/2016 16:12     Scheduled Meds: . diltiazem  120 mg Oral Daily  . rosuvastatin  40 mg Oral q1800  . sodium chloride flush  3 mL Intravenous Q12H  . tamsulosin  0.4 mg  Oral QPC supper  . [START ON 04/07/2016] vitamin B-12  1,000 mcg Oral Once per day on Wed Sat   Continuous Infusions: PRN Meds: acetaminophen, ondansetron **OR** ondansetron (ZOFRAN) IV  Time spent: 30 minutes  Author: Berle Mull, MD [facility] Pager: [phone] 04/05/2016 7:04 PM  If 7PM-7AM, please contact night-coverage at [email or web address], password [facility]

## 2016-04-05 NOTE — Progress Notes (Signed)
Spoke with Dr. Hilbert Bible regarding patients v/s and orthostatics today.  Dr. Vonzella Nipple a 500cc bolus of fluid, before saline locking him.

## 2016-04-05 NOTE — Progress Notes (Addendum)
NEURO HOSPITALIST CONSULT NOTE     Interval history:   He noticed  Friday when he fell that he had worsening weakness and numbness, he fell once in the bathroom and once while trying to get in the recliner. He ambulate with a walker at baseline. He does not know if it is getting better or worse. Symptoms are stable from yesterday, not worsening. No difficulty breathing. Numbness right below the knees to the feet. No new low back pain. He has had a foley since October due to not being able to urinate since urosepsis. Symptoms are symmetric. He reports numbness in the tips of the fingers not worsening. Feels like he improving .   Initial neuro HPI:                                                                                                                                          [name] is an 75 y.o. male who presented on the 19th with 2-3 day history of new onset bilateral lower extremity numbness and weakness. His PMHx includes prior CVA, left CEA, craniotomy for meningioma resection, GBS in 1999, HTN, HLD, CKD Stage 3 and recently diagnosed paroxysmal atrial fibrillation. He takes Coumadin at home. He has two recent admissions. The first was from 10/21-23 for urosepsis, at which time he also was diagnosed with new onset atrial fibrillation.  He was admitted again on 10/27 for left knee cellulitis. He has had some problems with lower extremity edema, recently treated with Lasix as an outpatient.   Onset of his lower extremity symptoms was 2-3 days ago, manifesting as sensory numbness in bilateral legs, from below knees to toes. He also has experienced worsening lower extremity weakness limiting his ambulation. ED evaluation revealed intact pedal pulses. He was not complaining of shortness of breath or chest pain.  Pt has fallen twice since the numbness started, his last fall occurring today. He has had no injuries due to the falls. The pateint uses a walker.   Past  Medical History:  Diagnosis Date  . Atrial fibrillation ([place]) 02/2016   NEW ONSET  . Brain tumor (benign) ([place]) 1998   2 small tumors- since 2015 (Dr. [name] following)  . Carotid artery occlusion   . Cerebrovascular disease September 09, 2001   TIA, Left brain  . CKD (chronic kidney disease), stage III   . Diverticulosis   . ED (erectile dysfunction)   . Guillain-Barre syndrome ([place]) 1999  . Hx of elevated lipids   . Hyperlipidemia   . Hypertension   . Memory loss   . Stroke [facility]) 2003    Past Surgical History:  Procedure Laterality Date  . BRAIN SURGERY  1998   Benign brain tumor removed, Left hemisphere- ? Meningioma  . CAROTID ENDARTERECTOMY  Left September 13, 2001   LEFT cea  . CHOLECYSTECTOMY  Nov. 2001  . COLON SURGERY  Feb. 2003   Colonic polyps removed endoscopically  . COLONOSCOPY WITH PROPOFOL N/A 11/11/2015   Procedure: COLONOSCOPY WITH PROPOFOL;  Surgeon: Garlan Fair, MD;  Location: [facility] ENDOSCOPY;  Service: Endoscopy;  Laterality: N/A;  . INGUINAL HERNIA REPAIR  1970's    Family History  Problem Relation Age of Onset  . Colon cancer Mother     Metastatic   . Cancer Mother     Colon  . Stroke Father   . Diabetes Father     Social History:  reports that he quit smoking about 19 years ago. His smoking use included Cigarettes. He has never used smokeless tobacco. He reports that he does not drink alcohol or use drugs.  Allergies  Allergen Reactions  . Asa [Aspirin] Other (See Comments)    Severe skin bruising   . Immune Globulins Other (See Comments)    Tetanus Immunes Globulins    (  Pt. Cannot take the Flu shot ) [facility] MEDICATIONS:                                                                                                                      acetaminophen (TYLENOL) 500 MG tablet Take 1,000 mg by mouth every 6 (six) hours as needed for headache (pain). Geradine Girt, DO Reordered  Orderedas:acetaminophen (TYLENOL) tablet 1,000 mg  - 1,000 mg, Oral, Every 6 hours PRN, headache, pain, Starting Sun 04/04/16 at 2209  cholecalciferol (VITAMIN D) 1000 units tablet Take 1,000 Units by mouth daily. Geradine Girt, DO Reordered  Orderedas:cholecalciferol (VITAMIN D) tablet 1,000 Units - 1,000 Units, Oral, Daily, First dose on Mon 04/05/16 at 1000  diltiazem (CARDIZEM CD) 120 MG 24 hr capsule Take 1 capsule (120 mg total) by mouth daily. Geradine Girt, DO Reordered  Orderedas:diltiazem (CARDIZEM CD) 24 hr capsule 120 mg - 120 mg, Oral, Daily, First dose on Mon 04/05/16 at 1000  furosemide (LASIX) 80 MG tablet Take 40-80 mg by mouth daily as needed for fluid or edema (weight gain of 2 lbs in 24 hours or 5 lbs in a week).  Geradine Girt, DO Not Ordered  metoprolol (LOPRESSOR) 50 MG tablet Take 1 tablet (50 mg total) by mouth 2 (two) times daily. Geradine Girt, DO Reordered  Orderedas:metoprolol (LOPRESSOR) tablet 50 mg - 50 mg, Oral, 2 times daily, First dose on Sun 04/04/16 at 2215  rosuvastatin (CRESTOR) 40 MG tablet Take 40 mg by mouth daily. Geradine Girt, DO Reordered  Orderedas:rosuvastatin (CRESTOR) tablet 40 mg - 40 mg, Oral, Daily-1800, First dose on Mon 04/05/16 at 1800  tamsulosin (FLOMAX) 0.4 MG CAPS capsule Take 1 capsule (0.4 mg total) by mouth daily after supper. Geradine Girt, DO Reordered  Orderedas:tamsulosin [facility]) capsule 0.4 mg - 0.4 mg, Oral, Daily after supper, First dose on Mon 04/05/16 at 1800  vitamin B-12 (  CYANOCOBALAMIN) 1000 MCG tablet Take 1,000 mcg by mouth 2 (two) times a week. Wednesday and Saturday Geradine Girt, DO Not Ordered  Orderedas:vitamin B-12 (CYANOCOBALAMIN) tablet 1,000 mcg - 1,000 mcg, Oral, 2 times weekly (Once per day on Mon Thu), First dose on Mon 04/05/16 at 0900 (Discontinued)  warfarin (COUMADIN) 5 MG tablet        ROS:                                                                                                                                       History  obtained from patient. Denies headache, chest pain, abdominal pain or limb pain. Other ROS as per HPI.    Blood pressure (!) 142/52, pulse (!) 58, temperature 97.8 F (36.6 C), temperature source Oral, resp. rate 20, height 5\' 11"  (1.803 m), weight 97.5 kg (215 lb), SpO2 96 %.   General Examination:                                                                                                      HEENT-  Normocephalic/atraumatic.  Lungs: Respirations unlabored Extremities- Mild edema below knees bilaterally.   Neurological Examination Mental Status: Alert, oriented, thought content appropriate without agitation. Speech fluent. Comprehension intact but with mild cognitive slowing.  Memory: relies on his wife to clarify portions of his past medical history.  Cranial Nerves: II: Visual fields intact to confrontation testing, pupils equal, round and reactive to light  III,IV, VI: ptosis not present, extra-ocular motions intact bilaterally, with saccadic quality to his visual pursuits. No nystagmus.  V,VII: Mild hypomimia. Smile symmetric, facial temperature sensation normal bilaterally VIII: hearing impaired to conversation IX,X: palate rises symmetrically XI: bilateral shoulder shrug is equal XII: midline tongue extension Motor:  Upper extremities 5/5 bilat Right hip flexion 5-/5 left 4+/5 right leg extension 5/5 bilat leg flexion 5-/5 bilat Foot DF 4+/5 bilat Normal tone bilateral lower extremities. Normal tone upper extremities. No atrophy noted  DTRs absent lowers, trace uppers  Sensory: Temperature and light touch intact in upper extremities.  Lower extremities with normal fine touch sensation proximally, decreased FT circumferentially below knees bilaterally, worse distally. Temperature sensation "normal" per patient to thighs, legs and feet bilaterally. Severe proprioceptive loss toes bilaterally. Moderate proprioceptive loss ankles bilaterally.   Plantars: Right:  downgoing   Left: downgoing  Gait: Did not ambulate today, will await PT  Lab Results: Basic Metabolic Panel:  Recent Labs Lab 04/04/16 1848 04/05/16 0543  NA 141 142  K 4.2 4.2  CL 106 109  CO2 25 27  GLUCOSE 117* 104*  BUN 14 12  CREATININE 1.57* 1.48*  CALCIUM 9.1 8.7*  MG 2.3  --     Liver Function Tests: No results for input(s): AST, ALT, ALKPHOS, BILITOT, PROT, ALBUMIN in the last 168 hours. No results for input(s): LIPASE, AMYLASE in the last 168 hours. No results for input(s): AMMONIA in the last 168 hours.  CBC:  Recent Labs Lab 04/04/16 1848 04/05/16 0543  WBC 14.7* 14.7*  NEUTROABS 11.2*  --   HGB 14.1 12.9*  HCT 44.4 41.1  MCV 89.5 90.1  PLT 248 238    Cardiac Enzymes: No results for input(s): CKTOTAL, CKMB, CKMBINDEX, TROPONINI in the last 168 hours.  Lipid Panel: No results for input(s): CHOL, TRIG, HDL, CHOLHDL, VLDL, LDLCALC in the last 168 hours.  CBG: No results for input(s): GLUCAP in the last 168 hours.  Microbiology: Results for orders placed or performed during the hospital encounter of 03/12/16  Culture, blood (Routine X 2) w Reflex to ID Panel     Status: None   Collection Time: 03/13/16  4:25 AM  Result Value Ref Range Status   Specimen Description BLOOD RIGHT ARM  Final   Special Requests BOTTLES DRAWN AEROBIC AND ANAEROBIC 5ML  Final   Culture NO GROWTH 5 DAYS  Final   Report Status 03/18/2016 FINAL  Final  Culture, blood (Routine X 2) w Reflex to ID Panel     Status: None   Collection Time: 03/13/16  4:47 AM  Result Value Ref Range Status   Specimen Description BLOOD RIGHT HAND  Final   Special Requests BOTTLES DRAWN AEROBIC AND ANAEROBIC 5ML  Final   Culture NO GROWTH 5 DAYS  Final   Report Status 03/18/2016 FINAL  Final    Coagulation Studies:  Recent Labs  04/04/16 1848 04/05/16 0543  LABPROT 26.0* 25.0*  INR 2.33 2.23    Imaging: Personally reviewed MRI brain images 11/2013  FINDINGS: Left frontal  parietal craniotomy for tumor resection, unknown type. There is moderate encephalomalacia in the left frontal temporal lobe, stable from the prior study. No recurrent tumor at the surgical site.  Right parafalcine enhancing mass lesion is present in the parietal region. This is adjacent to the falx and has a broad dural base. The mass shows homogeneous enhancement and measures approximately 19 x 33 mm on axial images. The mass appears to be invading the superior sagittal sinus which appears to be occluded. There is a collateral vein to the left of the sinus which is patent.  Enhancing mass lesion of the planum sphenoidale on the left extending to the anterior clinoid process compatible with meningioma, unchanged from the prior study. This measures 15 x 16 mm.  Negative for hydrocephalus. Compensatory enlargement of the left frontal horn due to prior resection and encephalomalacia. Negative for acute infarct. Chronic occlusion right internal carotid artery.  IMPRESSION: Postop craniotomy on the left. No recurrent tumor in the surgical site.  Right parasagittal meningioma over the convexity measures 19 x 33 mm. The tumor appears to invade and occluded the superior sagittal sinus.  15 x 16 mm meningioma of the left planum sphenoidale extending to the anterior clinoid process.   Assessment: 1. Three day history of progressive lower extremity weakness and sensory numbness in an ascending pattern. Exam is improved today with 4-5/5 strength in the lower extremities. He is hyporeflexic in the lower extremities with decreased sensation however unclear what his  baseline is given age, co-morbidities and previous GBS. Patient also has an enlarging para-falcine mass which he is imaged every 6 months and enlargement could cause weakness. Had a long discussion with husband and wife, he appears to be improving, history is concerning for GBS but cannot get LP at this time due to INR (on  coumadin). They deny any contraindication to getting MRIs (just had one last month). Would favor imaging brain and lumbar spine for other etiogies first, he had fluid overload and a difficult course after IVIG treatment in the past (per family no allergic reaction) and they do not favor treating empirically with IVIG until workup fully completed especially in light of improving exam.   Recommendations: Workup for other possible causes other than GBS 1. Respiratory therapy consult for q4h FVC and NIF. If FVC drops below 20 ml/kg, will need transfer to MICU. Hold IVIG as per above.  2. MRI Lumbar spine and brain:  Patient has an enlarging para-falcine mass for which he is imaged every 6 months and enlargement could cause weakness, needs follow up of the brain.  3. Fluoroscopically guided lumbar puncture to assess for albuminocytologic dissociation - on hold due to INR 4. PT/OT/Speech.  7. Monitor sodium levels. A subset of AIDP patients also develop SIADH - the underlying physiology of this relationship is unknown.  8. Monitor vitals carefully for possible development of dysautonomia, which often occurs in GBS.    Sarina Ill, MD [facility]

## 2016-04-05 NOTE — Progress Notes (Addendum)
   04/05/16 2132  What Happened  Was fall witnessed? Yes  Who witnessed fall? Lorriane Shire NT)  Patients activity before fall bathroom-assisted  Point of contact other (comment) (bilateral knees hit floor)  Was patient injured? No  Follow Up  MD notified Lamar Blinks NP)  Time MD notified 2134  Family notified Yes-comment (spoke with his wife, "she understands, the same thing happen)  Time family notified 2136  Additional tests No  Simple treatment (pt refused any further treatment)  Progress note created (see row info) Yes   Nurse was called down to room by NT for help with patient.  Upon entering the room patient was kneeling on the ground, with both knees bent underneath him.  According to NT, she was transferring patient from bedside comode to his bed. Patient stated, "my knees are breaking!"  We repositioned patient onto his side and seated him on his but with back against the side of the bed.  We waited for assistance from other staff before trying to lift him up off the floor.  The patient was wearing grippy socks but no gait belt was in use.

## 2016-04-05 NOTE — Progress Notes (Addendum)
-  18 NIF  VC .5L Pt unable to properly seal mouth around filter. Due to past history of GB.  Per pt.

## 2016-04-05 NOTE — Consult Note (Signed)
   [facility] CM Inpatient Consult   04/05/2016  [name] Jan 10, 1941 [id number]  Patient is currently active with [facility] for chronic disease management services.  Patient has been engaged by a [employer].Came by to see the patient.   Patient is currently off the unit. Will update inpatient RNCM of patient's active status with [facility].  Of note, [facility] Care Management services does not replace or interfere with any services that are needed or arranged by inpatient case management or social work.  For additional questions or referrals please contact:   Natividad Brood, RN BSN [facility] Liaison  [phone] business mobile phone Toll free office [phone]

## 2016-04-05 NOTE — Progress Notes (Signed)
Patient ambulated to bathroom with 1 assist with walker. Tolerated fairly well, stated he felt tired after ambulation. Patient is now sitting up in chair. Chair alarm on, will continue to monitor.

## 2016-04-05 NOTE — Care Management Note (Signed)
Case Management Note  Patient Details  Name: [name] MRN: [id number] Date of Birth: [birth year]  Subjective/Objective:  Pt in with lower extremity weakness. He is from home with his spouse.                   Action/Plan: Awaiting LP and MRI of brain and spine. CM following for discharge needs.   Expected Discharge Date:                  Expected Discharge Plan:     In-House Referral:     Discharge planning Services     Post Acute Care Choice:    Choice offered to:     DME Arranged:    DME Agency:     HH Arranged:    HH Agency:     Status of Service:     If discussed at [employer], dates discussed:    Additional Comments:  Pollie Friar, RN 04/05/2016, 2:16 PM

## 2016-04-05 NOTE — Consult Note (Signed)
NEURO HOSPITALIST CONSULT NOTE   Requestig physician: Dr. Eliseo Squires  Reason for Consult: Three day history of progressive lower extremity weakness and numbness  History obtained from:  Patient and Chart     HPI:                                                                                                                                          [name] is an 75 y.o. male who presents today with 2-3 day history of new onset bilateral lower extremity numbness and weakness. His PMHx includes prior CVA, left CEA, craniotomy for meningioma resection, GBS in 1999, HTN, HLD, CKD Stage 3 and recently diagnosed paroxysmal atrial fibrillation. He takes Coumadin at home. He has two recent admissions. The first was from 10/21-23 for urosepsis, at which time he also was diagnosed with new onset atrial fibrillation.  He was admitted again on 10/27 for left knee cellulitis. He has had some problems with lower extremity edema, recently treated with Lasix as an outpatient.   Onset of his lower extremity symptoms was 2-3 days ago, manifesting as sensory numbness in bilateral legs, from below knees to toes. He also has experienced worsening lower extremity weakness limiting his ambulation. ED evaluation revealed intact pedal pulses. He was not complaining of shortness of breath or chest pain.  Pt has fallen twice since the numbness started, his last fall occurring today. He has had no injuries due to the falls. The pateint uses a walker.   Past Medical History:  Diagnosis Date  . Atrial fibrillation ([place]) 02/2016   NEW ONSET  . Brain tumor (benign) [facility]) March 2015   2 small tumors  . Carotid artery occlusion   . Cerebrovascular disease September 09, 2001   TIA, Left brain  . CKD (chronic kidney disease), stage III   . Diverticulosis   . ED (erectile dysfunction)   . Guillain-Barre syndrome ([place]) 1999  . Hx of elevated lipids   . Hyperlipidemia   . Hypertension   . Memory loss    . Stroke [facility]) 2003    Past Surgical History:  Procedure Laterality Date  . BRAIN SURGERY  1998   Benign brain tumor removed, Left hemisphere- ? Meningioma  . CAROTID ENDARTERECTOMY Left September 13, 2001   LEFT cea  . CHOLECYSTECTOMY  Nov. 2001  . COLON SURGERY  Feb. 2003   Colonic polyps removed endoscopically  . COLONOSCOPY WITH PROPOFOL N/A 11/11/2015   Procedure: COLONOSCOPY WITH PROPOFOL;  Surgeon: Garlan Fair, MD;  Location: [facility] ENDOSCOPY;  Service: Endoscopy;  Laterality: N/A;  . INGUINAL HERNIA REPAIR  1970's    Family History  Problem Relation Age of Onset  . Colon cancer Mother     Metastatic   . Cancer Mother  Colon  . Stroke Father   . Diabetes Father     Social History:  reports that he quit smoking about 19 years ago. His smoking use included Cigarettes. He has never used smokeless tobacco. He reports that he does not drink alcohol or use drugs.  Allergies  Allergen Reactions  . Asa [Aspirin]     Severe skin bruising   . Immune Globulins Other (See Comments)    Tetanus Immunes Globulins    (  Pt. Cannot take the Flu shot ) [facility] MEDICATIONS:                                                                                                                      acetaminophen (TYLENOL) 500 MG tablet Take 1,000 mg by mouth every 6 (six) hours as needed for headache (pain). Geradine Girt, DO Reordered  Orderedas:acetaminophen (TYLENOL) tablet 1,000 mg - 1,000 mg, Oral, Every 6 hours PRN, headache, pain, Starting Sun 04/04/16 at 2209  cholecalciferol (VITAMIN D) 1000 units tablet Take 1,000 Units by mouth daily. Geradine Girt, DO Reordered  Orderedas:cholecalciferol (VITAMIN D) tablet 1,000 Units - 1,000 Units, Oral, Daily, First dose on Mon 04/05/16 at 1000  diltiazem (CARDIZEM CD) 120 MG 24 hr capsule Take 1 capsule (120 mg total) by mouth daily. Geradine Girt, DO Reordered  Orderedas:diltiazem (CARDIZEM CD) 24 hr capsule 120 mg -  120 mg, Oral, Daily, First dose on Mon 04/05/16 at 1000  furosemide (LASIX) 80 MG tablet Take 40-80 mg by mouth daily as needed for fluid or edema (weight gain of 2 lbs in 24 hours or 5 lbs in a week).  Geradine Girt, DO Not Ordered  metoprolol (LOPRESSOR) 50 MG tablet Take 1 tablet (50 mg total) by mouth 2 (two) times daily. Geradine Girt, DO Reordered  Orderedas:metoprolol (LOPRESSOR) tablet 50 mg - 50 mg, Oral, 2 times daily, First dose on Sun 04/04/16 at 2215  rosuvastatin (CRESTOR) 40 MG tablet Take 40 mg by mouth daily. Geradine Girt, DO Reordered  Orderedas:rosuvastatin (CRESTOR) tablet 40 mg - 40 mg, Oral, Daily-1800, First dose on Mon 04/05/16 at 1800  tamsulosin (FLOMAX) 0.4 MG CAPS capsule Take 1 capsule (0.4 mg total) by mouth daily after supper. Geradine Girt, DO Reordered  Orderedas:tamsulosin [facility]) capsule 0.4 mg - 0.4 mg, Oral, Daily after supper, First dose on Mon 04/05/16 at 1800  vitamin B-12 (CYANOCOBALAMIN) 1000 MCG tablet Take 1,000 mcg by mouth 2 (two) times a week. Wednesday and Saturday Geradine Girt, DO Not Ordered  Orderedas:vitamin B-12 (CYANOCOBALAMIN) tablet 1,000 mcg - 1,000 mcg, Oral, 2 times weekly (Once per day on Mon Thu), First dose on Mon 04/05/16 at 0900 (Discontinued)  warfarin (COUMADIN) 5 MG tablet        ROS:  History obtained from patient. Denies headache, chest pain, abdominal pain or limb pain. Other ROS as per HPI.    Blood pressure 139/60, pulse (!) 52, temperature 97.8 F (36.6 C), temperature source Oral, resp. rate 19, height 5\' 11"  (1.803 m), weight 98.9 kg (218 lb), SpO2 95 %.   General Examination:                                                                                                      HEENT-  Normocephalic/atraumatic.  Lungs: Respirations unlabored Extremities- Mild edema below  knees bilaterally.   Neurological Examination Mental Status: Alert, oriented, thought content appropriate without agitation. Speech fluent. Comprehension intact but with mild cognitive slowing. Mild naming deficit. One error with repetition. Had some difficulty with a complex motor command. Memory: relies on his wife to clarify portions of his past medical history.  Cranial Nerves: II: Visual fields intact to confrontation testing, pupils equal, round and reactive to light  III,IV, VI: ptosis not present, extra-ocular motions intact bilaterally, with saccadic quality to his visual pursuits. No nystagmus.  V,VII: Mild hypomimia. Smile symmetric, facial temperature sensation normal bilaterally VIII: hearing intact to conversation IX,X: palate rises symmetrically XI: bilateral shoulder shrug is equal XII: midline tongue extension Motor: RUE 5/5  RLE 4/5 hip flexion, knee extension/flexion, ankle dorsi/plantar flexion  LUE 5/5  LLE 4-/5 hip flexion, knee extension/flexion, ankle dorsi/plantar flexion Decreased tone bilateral lower extremities. Normal tone upper extremities. No atrophy noted Sensory: Temperature and light touch intact in upper extremities.  Lower extremities with normal fine touch sensation proximally, decreased FT circumferentially below knees bilaterally, worse distally. Temperature sensation "normal" per patient to thighs, legs and feet bilaterally. Severe proprioceptive loss toes bilaterally. Moderate proprioceptive loss ankles bilaterally.  Deep Tendon Reflexes: 0 patellae and achilles bilaterally. Hypoactive 1+ bilateral brachioradialis and triceps. Absent biceps reflexes bilaterally.  Plantars: Right: downgoing   Left: downgoing Cerebellar: Subtle dyssinergia and dysmetria with FNF bilaterally. Subtle ataxia with heel-shin bilaterally.  Gait: Difficulty standing with own power, requires support, small steps with rapid fatiguing and unsteadiness. Gives way at knees which  worsens as he walks. Able to tolerate about 20 seconds of ambulation and could traverse only 5 feet.   Lab Results: Basic Metabolic Panel:  Recent Labs Lab 04/04/16 1848  NA 141  K 4.2  CL 106  CO2 25  GLUCOSE 117*  BUN 14  CREATININE 1.57*  CALCIUM 9.1  MG 2.3    Liver Function Tests: No results for input(s): AST, ALT, ALKPHOS, BILITOT, PROT, ALBUMIN in the last 168 hours. No results for input(s): LIPASE, AMYLASE in the last 168 hours. No results for input(s): AMMONIA in the last 168 hours.  CBC:  Recent Labs Lab 04/04/16 1848  WBC 14.7*  NEUTROABS 11.2*  HGB 14.1  HCT 44.4  MCV 89.5  PLT 248    Cardiac Enzymes: No results for input(s): CKTOTAL, CKMB, CKMBINDEX, TROPONINI in the last 168 hours.  Lipid Panel: No results for input(s): CHOL, TRIG, HDL, CHOLHDL, VLDL, LDLCALC in the last 168 hours.  CBG: No results for  input(s): GLUCAP in the last 168 hours.  Microbiology: Results for orders placed or performed during the hospital encounter of 03/12/16  Culture, blood (Routine X 2) w Reflex to ID Panel     Status: None   Collection Time: 03/13/16  4:25 AM  Result Value Ref Range Status   Specimen Description BLOOD RIGHT ARM  Final   Special Requests BOTTLES DRAWN AEROBIC AND ANAEROBIC 5ML  Final   Culture NO GROWTH 5 DAYS  Final   Report Status 03/18/2016 FINAL  Final  Culture, blood (Routine X 2) w Reflex to ID Panel     Status: None   Collection Time: 03/13/16  4:47 AM  Result Value Ref Range Status   Specimen Description BLOOD RIGHT HAND  Final   Special Requests BOTTLES DRAWN AEROBIC AND ANAEROBIC 5ML  Final   Culture NO GROWTH 5 DAYS  Final   Report Status 03/18/2016 FINAL  Final    Coagulation Studies:  Recent Labs  04/04/16 1848  LABPROT 26.0*  INR 2.33    Imaging: No results found.  Assessment: 1. Three day history of progressive lower extremity weakness and sensory numbness in an ascending pattern. Exam reveals significant lower  extremity weakness, lower extremity areflexia and upper extremity hyporeflexia. No signs/symptoms of respiratory distress noted. Highest component of the DDx is recurrent episode of AIDP (GBS). Less likely would be cauda equina compression (no back pain or incontinence) or infiltrative process (epidemiologically less likely). Not likely tick paralysis as no history of recent tick bite. Not likely a myelopathy as there are no UMN signs on exam.  2. New onset atrial fibrillation, on Coumadin. INR is 2.33. 3. Leukocytosis.   Recommendations: 1. Respiratory therapy consult for q4h FVC and NIF. If FVC drops below 20 ml/kg, will need transfer to MICU.  2. Unable to obtain MRI lumbar spine due to surgical clip in colon. Will obtain CT of lumbar spine.  3. Fluoroscopically guided lumbar puncture to assess for albuminocytologic dissociation.  4. Best option is IVIG as soon as possible. However, an allergy to immune globulin is listed in EPIC. Unclear if this is a true allergy to IVIG as documentation on the listed allergy states "Tetanus Immunes Globulins ( Pt. Cannot take the Flu shot )". This will need to be clarified with pharmacy and/or his PCP first thing in the morning.  5. Plasma exchange is another option, but the patient would need to be transitioned from Coumadin to IV heparin in order for plasmapheresis catheter to be placed. If he has a definite allergy to IVIG, this may be the best option.   6. PT/OT/Speech.  7. Monitor sodium levels. A subset of AIDP patients also develop SIADH - the underlying physiology of this relationship is unknown.  8. Monitor vitals carefully for possible development of dysautonomia, which often occurs in GBS.    Electronically signed: Dr. Kerney Elbe 04/04/2016, 8:26 PM

## 2016-04-05 NOTE — Progress Notes (Signed)
Upon reviewing vital signs for today, the patient has low blood pressures recorded for today and the patient was saline locked on fluid at 10am....will page doctor about addressing hypotension of patient.

## 2016-04-06 ENCOUNTER — Inpatient Hospital Stay (HOSPITAL_COMMUNITY): Payer: PPO

## 2016-04-06 DIAGNOSIS — Z8673 Personal history of transient ischemic attack (TIA), and cerebral infarction without residual deficits: Secondary | ICD-10-CM | POA: Diagnosis not present

## 2016-04-06 DIAGNOSIS — N289 Disorder of kidney and ureter, unspecified: Secondary | ICD-10-CM | POA: Diagnosis not present

## 2016-04-06 DIAGNOSIS — G610 Guillain-Barre syndrome: Secondary | ICD-10-CM | POA: Diagnosis not present

## 2016-04-06 LAB — URINE CULTURE: CULTURE: NO GROWTH

## 2016-04-06 LAB — C-REACTIVE PROTEIN: CRP: 3.7 mg/dL — AB (ref <1.0)

## 2016-04-06 LAB — PROTIME-INR
INR: 1.86
PROTHROMBIN TIME: 21.7 s — AB (ref 11.4–15.2)

## 2016-04-06 LAB — SEDIMENTATION RATE: SED RATE: 46 mm/h — AB (ref 0–16)

## 2016-04-06 NOTE — Evaluation (Signed)
Physical Therapy Evaluation Patient Details Name: Maven Varelas MRN: [id number] DOB: 29-Apr-1941 Today's Date: 04/06/2016   History of Present Illness  75 y.o.malewho presents today with 2-3 day history of new onset bilateral lower extremity numbness and weakness. His PMHx includes prior CVA, left CEA, craniotomy for meningioma resection, GBS in 1999, HTN, HLD, CKD Stage 3 and recently diagnosed paroxysmal atrial fibrillation. He takes Coumadin at home.. Patient has had multiple falls at home and fell 11/20 pm while hospitalized.    Clinical Impression  Pt admitted with above symptoms, with ongoing testing for etiology/diagnosis. Pt currently with functional limitations due to the deficits listed below (see PT Problem List). Patient currently unable to stand with +1 assist, yet walked to bathroom with nurse and RW yesterday. Wife reports it was this way at home. One time he could walk without falling and other times his "legs collapsed under him." (They report all falls have been of this type). Pt may benefit from skilled PT to increase their independence and safety with mobility to allow discharge to the venue listed below.       Follow Up Recommendations CIR    Equipment Recommendations  Other (comment) (TBD; needs post-acute therapies)    Recommendations for Other Services OT consult;Rehab consult     Precautions / Restrictions Precautions Precautions: Fall Restrictions Weight Bearing Restrictions: No      Mobility  Bed Mobility Overal bed mobility: Needs Assistance Bed Mobility: Rolling;Sidelying to Sit;Sit to Supine Rolling: Supervision Sidelying to sit: Supervision   Sit to supine: Supervision   General bed mobility comments: for safety; heavy use of rail side to sit (HOB 0); vc for scooting to EOB  Transfers Overall transfer level: Needs assistance Equipment used: Rolling walker (2 wheeled) Transfers: Sit to/from Stand Sit to Stand:  (unable with one person)         General transfer comment: only able to clear buttocks ~8" off bed with 1 person assist; could not transition Rt hand to RW from bed rail  Ambulation/Gait             General Gait Details: unable  Stairs            Wheelchair Mobility    Modified Rankin (Stroke Patients Only)       Balance Overall balance assessment: Needs assistance;History of Falls Sitting-balance support: No upper extremity supported;Feet supported Sitting balance-Leahy Scale: Good Sitting balance - Comments: reaches 10 inches outside BOS diagonally fwd Lt and Rt                                     Pertinent Vitals/Pain Pain Assessment: No/denies pain    Home Living Family/patient expects to be discharged to:: Inpatient rehab Living Arrangements: Spouse/significant other Available Help at Discharge: Family (wife, son) Type of Home: House Home Access: Stairs to enter   Technical brewer of Steps: 2 Home Layout: Two level;Able to live on main level with bedroom/bathroom Home Equipment: Gilford Rile - 2 wheels;Bedside commode (walker is borrowed)      Prior Function Level of Independence: Independent with assistive device(s)         Comments: Pt had recent admission with Lt knee cellulitis and has been using RW since then. Receiving HHPT PTA     Hand Dominance   Dominant Hand: Right    Extremity/Trunk Assessment   Upper Extremity Assessment: RUE deficits/detail;LUE deficits/detail RUE Deficits / Details: AROM WFL; strength  5/5 biceps, triceps, grip   RUE Sensation: decreased light touch (along all 5 fingertips) LUE Deficits / Details: AROM WFL; strength 5/5 biceps, triceps, grip   Lower Extremity Assessment: RLE deficits/detail;LLE deficits/detail RLE Deficits / Details: AAROM WFL; hip flexion 3+, hip extension 3+, knee extension 4, ankle DF 4, toe ext 3+ LLE Deficits / Details: AAROM WFL; hip flexion 3, hip extension 3+, knee extension 3+, ankle DF 3+,  toe ext 3+  Cervical / Trunk Assessment: Kyphotic (no sensory changes; maintains upright with max perturbation)  Communication   Communication: No difficulties  Cognition Arousal/Alertness: Awake/alert Behavior During Therapy: WFL for tasks assessed/performed Overall Cognitive Status: Within Functional Limits for tasks assessed                      General Comments General comments (skin integrity, edema, etc.): wife present    Exercises     Assessment/Plan    PT Assessment Patient needs continued PT services  PT Problem List Decreased strength;Decreased mobility;Decreased knowledge of use of DME;Impaired sensation;Obesity          PT Treatment Interventions DME instruction;Gait training;Stair training;Functional mobility training;Therapeutic activities;Therapeutic exercise;Patient/family education;Neuromuscular re-education    PT Goals (Current goals can be found in the Care Plan section)  Acute Rehab PT Goals Patient Stated Goal: stop falling PT Goal Formulation: With patient/family Time For Goal Achievement: 04/20/16 Potential to Achieve Goals: Fair (current etiology unknown)    Frequency Min 3X/week   Barriers to discharge Inaccessible home environment steps to enter home    Co-evaluation               End of Session Equipment Utilized During Treatment: Gait belt Activity Tolerance: Patient tolerated treatment well Patient left: in bed;with call bell/phone within reach;with bed alarm set;with family/visitor present;with SCD's reapplied Nurse Communication: Mobility status         Time: 3662-9476 PT Time Calculation (min) (ACUTE ONLY): 24 min   Charges:   PT Evaluation $PT Eval High Complexity: 1 Procedure     PT G CodesJeanie Cooks Tennelle Taflinger 04-30-16, 11:17 AM  Pager [phone]

## 2016-04-06 NOTE — Progress Notes (Signed)
Subjective: Interval History: none..  Objective: Vital signs in last 24 hours: Temp:  [98.1 F (36.7 C)-99 F (37.2 C)] 99 F (37.2 C) (11/21 1450) Pulse Rate:  [69-81] 81 (11/21 1450) Resp:  [16-20] 16 (11/21 1450) BP: (119-151)/(49-62) 149/53 (11/21 1450) SpO2:  [90 %-94 %] 92 % (11/21 1450)  Intake/Output from previous day: 11/20 0701 - 11/21 0700 In: -  Out: 1600 [Urine:1600] Intake/Output this shift: Total I/O In: -  Out: 300 [Urine:300] Nutritional status: Diet Heart Room service appropriate? Yes; Fluid consistency: Thin  Neurological Examination Mental Status: unremarkable Cranial Nerves: mild bifacial weakness which is baseline Motor:  Mild UE symmetric weakness 5-/5 LE - 4+/5 prox; 4-/5 distal DTRs absent lowers, trace uppers Sensory: intact uppers; decreased light touch in lowers below the knee.   Lab Results:  Recent Labs  04/04/16 1848 04/05/16 0543  WBC 14.7* 14.7*  HGB 14.1 12.9*  HCT 44.4 41.1  PLT 248 238  NA 141 142  K 4.2 4.2  CL 106 109  CO2 25 27  GLUCOSE 117* 104*  BUN 14 12  CREATININE 1.57* 1.48*  CALCIUM 9.1 8.7*   Lipid Panel No results for input(s): CHOL, TRIG, HDL, CHOLHDL, VLDL, LDLCALC in the last 72 hours.  Studies/Results: Ct Head Wo Contrast  Result Date: 04/05/2016 CLINICAL DATA:  Falls, bilateral lower extremity numbness and weakness. EXAM: CT HEAD WITHOUT CONTRAST TECHNIQUE: Contiguous axial images were obtained from the base of the skull through the vertex without intravenous contrast. COMPARISON:  MR brain 10/29/2015. FINDINGS: Brain: No evidence of an acute infarct, acute hemorrhage or hydrocephalus. A 3.5 cm hyper attenuating mass along the inner table of the high right parietal bone is unchanged. Adjacent low-attenuation/edema in the right parietal deep white matter may be slightly increased from 10/29/2015. A smaller hyperattenuating lesion along the floor of the left anterior cranial fossa (series 201, image 10) was  better seen on 10/29/2015. Mild atrophy. Vascular: No hyperdense vessel or unexpected calcification. Skull: Left frontoparietal craniectomy. Sinuses/Orbits: Tiny right mastoid effusion. Other: None. IMPRESSION: 1. 3.5 cm meningioma along the right parietal vertex with suspected increase in the extent of edema in the adjacent right parietal deep white matter. 2. No evidence of an acute infarct. 3. Left MCA territory encephalomalacia. 4. Small meningioma along the floor of the left anterior cranial fossa, better seen on 10/29/2015. Electronically Signed   By: Lorin Picket M.D.   On: 04/05/2016 11:55   Mr Jeri Cos ZO Contrast  Result Date: 04/05/2016 CLINICAL DATA:  75 year old male with 2 intracranial meningiomas under surveillance by neurosurgery. Recent hospitalization for UTI and left lower extremity infection. New lower extremity weakness. Initial encounter. EXAM: MRI HEAD WITHOUT AND WITH CONTRAST TECHNIQUE: Multiplanar, multiecho pulse sequences of the brain and surrounding structures were obtained without and with intravenous contrast. CONTRAST:  30mL MULTIHANCE GADOBENATE DIMEGLUMINE 529 MG/ML IV SOLN COMPARISON:  Restaging Brain MRI 10/29/2015 and earlier. Noncontrast head CT 1100 hours today. FINDINGS: Brain: Sequelae of left craniectomy with underlying left superior frontal gyrus, insula, and operculum encephalomalacia. Solid lobulated right paracentral posterior vertex meningioma encompasses 46 by 29 by 26 mm (AP by transverse by CC) versus 44 by 26 by 23 mm in June. Mild regional mass effect on the medial right parietal and posterior right superior frontal gyrus has not significantly changed. However, underlying white matter edema has progressed (series 7, image 20 today versus series 7, image 18 previously) and appears to affect the motor strip white matter. The second meningioma along  the left sphenoid planum at the anterior clinoid process is round and measures 21 by 20 x 13 mm (AP by transverse  by CC) and is stable. Stable mass effect on the inferior left frontal gyrus there with no definite associated cerebral edema. Elsewhere stable gray and white matter signal. No restricted diffusion to suggest acute ischemia. Stable ventricle size and configuration. No acute intracranial mass effect. No acute intracranial hemorrhage identified. Negative pituitary and cervicomedullary junction. Vascular: Major intracranial vascular flow voids are stable since June, including evidence of chronic occlusion of the right ICA, and superior sagittal venous sinus at the posterior vertex (series 6, images 9 and 23 respectively). Skull and upper cervical spine: Negative. Normal bone marrow signal. Sinuses/Orbits: Visualized paranasal sinuses and mastoids are stable and well pneumatized. Stable and negative orbits soft tissues. Other: No acute scalp soft tissue findings. Visible internal auditory structures appear normal. IMPRESSION: 1. Mildly enlarged posterior right paracentral vertex meningioma since June, now 46 x 29 x 26 mm (versus 44 x 26 x 23 mm). No significant change in regional mass effect but underlying white matter edema has increased and appears to affect the medial motor strip. 2. Left anterior clinoid process region 2 cm meningioma has not significantly changed. No associated edema. 3. 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 Chronic occlusion of the right ICA. Chronic left hemisphere encephalomalacia underlying the craniotomy site craniectomy site. Electronically Signed   By: Genevie Ann M.D.   On: 04/05/2016 16:31   Mr Lumbar Spine Wo Contrast  Result Date: 04/05/2016 CLINICAL DATA:  Numbness in the knees to the toes.  No back pain.  * EXAM: MRI LUMBAR SPINE WITHOUT CONTRAST TECHNIQUE: Multiplanar, multisequence MR imaging of the lumbar spine was performed. No intravenous contrast was administered. COMPARISON:  None. FINDINGS: Segmentation:  Standard. Alignment:   Physiologic. Vertebrae:  No fracture, evidence of discitis, or bone lesion. Conus medullaris: Extends to the L1 level and appears normal. Paraspinal and other soft tissues: No paraspinal abnormality. Partially visualize is a 4.7 cm right renal mass consistent with a cyst. Disc levels: Disc spaces: Degenerative disc disease mild disc height loss at L4-5. T12-L1: No significant disc bulge. No evidence of neural foraminal stenosis. No central canal stenosis. L1-L2: No significant disc bulge. No evidence of neural foraminal stenosis. No central canal stenosis. L2-L3: No significant disc bulge. No evidence of neural foraminal stenosis. No central canal stenosis. L3-L4: Minimal broad-based disc bulge. Mild bilateral facet arthropathy. Mild bilateral lateral recess stenosis. No foraminal stenosis. Mild central canal narrowing. L4-L5: Broad-based disc osteophyte complex. Bilateral moderate facet arthropathy. Mild spinal stenosis and bilateral lateral recess stenosis. No foraminal stenosis. L5-S1: Minimal broad-based disc bulge. Moderate bilateral facet arthropathy. No evidence of neural foraminal stenosis. No central canal stenosis. IMPRESSION: 1. At L3-4 there is minimal broad-based disc bulge. Mild bilateral facet arthropathy. Mild bilateral lateral recess stenosis. Mild central canal narrowing. 2. At L4-5 there is a broad-based disc osteophyte complex. Bilateral moderate facet arthropathy. Mild spinal stenosis and bilateral lateral recess stenosis. 3. At L5-S1 there is minimal broad-based disc bulge. Moderate bilateral facet arthropathy. Electronically Signed   By: Kathreen Devoid   On: 04/05/2016 16:12    Medications: I have reviewed the patient's current medications.  Assessment/Plan: 1. Lower limb weakness Unclear if this is new weakness vs. Recrudescence of residual weakness/numbness from prior GBS.  He had MRI brain and LS-spine which did not show any acute findings.  Will get MRI of  c-spine and t-spine to look  for nerve root enhancement suggestive of GBS.  If this is unrevealing will need to consider Lp.  Continue with PT/OT/speech.  Will hold off on empiric tx for now.    LOS: 2 days   Doren Custard

## 2016-04-06 NOTE — Progress Notes (Signed)
PT has evaluated pt. and is recommending CIR.  Will await neuro workup for suspected GBS and medical course before recommending IP Rehab consult.  Please call if questions.  [place] Admissions Coordinator Cell [phone] Office [phone]

## 2016-04-06 NOTE — Progress Notes (Signed)
Patient attempted to do NIF and VC . He had strong effort without success,

## 2016-04-06 NOTE — Progress Notes (Signed)
Came to patient's room to perform NIF and VC maneuver.  Patient unable to perform either due to his inability to completely close his mouth around the mouthpiece.  Will attempt again at a later time.

## 2016-04-06 NOTE — Progress Notes (Signed)
[facility] Progress Note  Patient: [name] JSH:[id number]   PCP: Irven Shelling, MD DOB: 1940/08/06   DOA: 04/04/2016   DOS: 04/06/2016   Date of Service: the patient was seen and examined on 04/06/2016  Brief hospital course: Pt. with PMH of CKD, CVA, history of GBS, A. fib on chronic anti-coagulation, meningioma, HTN, HLD; admitted on 04/04/2016, with complaint of recurrent fall, was found to have generalized weakness versus suspected GBS. Currently further plan is continue close monitoring and await further workup for suspected GBS.  Assessment and Plan: 1. Recurrent fall. Suspected GBS. Neurology consulted. Patient suspected of having GBS. Unable to perform lumbar puncture due to anticoagulation. MRI brain shows chronic occlusion of the right ICA and chronic left hemispheric and to follow malacia underlying craniotomy site. It also shows mildly enlarged meningioma. No active bleeding or acute abnormality identified on the CT of the head. MRI lumbar spine also unremarkable. Continue to monitor NIF Family was not willing to try IVIG empirically until definitive diagnosis is obtained since the patient has history of volume overload with IVIG in the past. No allergic reaction identified with IVIG per family. Neurology considering MRI of the C-spine as well as T-spine. Neurology also considering possibility of recurrent infection, I would initiate the workup.  2. A. fib. Chronic anticoagulation. Hypertension.  Currently holding Coumadin.  On metoprolol which is also currently on hold. Continue Cardizem. Orthostatic vitals are normal. The MRIs are unremarkable, Coumadin may need to be reversed.  3. Chronic kidney disease. Stage III. Continue to closely monitor.  4. Acute urinary retention. We will initiate the patient on Flomax.  Pain management: When necessary Tylenol Activity: Consulted physical therapy Bowel regimen: last BM prior to  admission Diet: Cardiac diet DVT Prophylaxis: mechanical compression device and on therapeutic anticoagulation.  Advance goals of care discussion: Full code  Family Communication: family was present at bedside, at the time of interview. The pt provided permission to discuss medical plan with the family. Opportunity was given to ask question and all questions were answered satisfactorily.   Disposition:  Discharge to home. Expected discharge date: 04/09/2016,  Consultants: Neurology Procedures: None  Antibiotics: Anti-infectives    None      Subjective: Had a fall last night while trying to go to the commode. Again he had significant weakness of his legs that gave him lost his balance. Denies any head injury or neck injury.  Objective: Physical Exam: Vitals:   04/06/16 0059 04/06/16 0603 04/06/16 1011 04/06/16 1450  BP: (!) 151/61 (!) 149/58 (!) 143/62 (!) 149/53  Pulse: 70 71 80 81  Resp: 20 20 16 16   Temp: 98.9 F (37.2 C) 98.4 F (36.9 C) 98.2 F (36.8 C) 99 F (37.2 C)  TempSrc: Oral Oral Oral Oral  SpO2: 90% 91% 94% 92%  Weight:      Height:        Intake/Output Summary (Last 24 hours) at 04/06/16 1722 Last data filed at 04/06/16 1000  Gross per 24 hour  Intake                0 ml  Output             1100 ml  Net            -1100 ml   Filed Weights   04/04/16 1708 04/04/16 2218  Weight: 98.9 kg (218 lb) 97.5 kg (215 lb)    General: Alert, Awake and Oriented to Time, Place and Person. Appear in  mild distress, affect appropriate Eyes: PERRL, Conjunctiva normal ENT: Oral Mucosa clear moist. Neck: difficult to assess JVD, no Abnormal Mass Or lumps Cardiovascular: S1 and S2 Present, no Murmur, Respiratory: Bilateral Air entry equal and Decreased, no use of accessory muscle, Clear to Auscultation, no Crackles, no wheezes Abdomen: Bowel Sound present, Soft and no tenderness Skin: no redness, no Rash, no induration Extremities: no Pedal edema, no calf  tenderness Neurologic: Grossly no focal neuro deficit. Bilateral lower ext weakness Data Reviewed: CBC:  Recent Labs Lab 04/04/16 1848 04/05/16 0543  WBC 14.7* 14.7*  NEUTROABS 11.2*  --   HGB 14.1 12.9*  HCT 44.4 41.1  MCV 89.5 90.1  PLT 248 160   Basic Metabolic Panel:  Recent Labs Lab 04/04/16 1848 04/05/16 0543  NA 141 142  K 4.2 4.2  CL 106 109  CO2 25 27  GLUCOSE 117* 104*  BUN 14 12  CREATININE 1.57* 1.48*  CALCIUM 9.1 8.7*  MG 2.3  --     Liver Function Tests: No results for input(s): AST, ALT, ALKPHOS, BILITOT, PROT, ALBUMIN in the last 168 hours. No results for input(s): LIPASE, AMYLASE in the last 168 hours. No results for input(s): AMMONIA in the last 168 hours. Coagulation Profile:  Recent Labs Lab 04/04/16 1848 04/05/16 0543 04/06/16 0303  INR 2.33 2.23 1.86   Cardiac Enzymes: No results for input(s): CKTOTAL, CKMB, CKMBINDEX, TROPONINI in the last 168 hours. BNP (last 3 results) No results for input(s): PROBNP in the last 8760 hours.  CBG: No results for input(s): GLUCAP in the last 168 hours.  Studies: No results found.   Scheduled Meds: . diltiazem  120 mg Oral Daily  . rosuvastatin  40 mg Oral q1800  . sodium chloride flush  3 mL Intravenous Q12H  . tamsulosin  0.4 mg Oral QPC supper  . [START ON 04/07/2016] vitamin B-12  1,000 mcg Oral Once per day on Wed Sat   Continuous Infusions: PRN Meds: acetaminophen, ondansetron **OR** ondansetron (ZOFRAN) IV  Time spent: 30 minutes  Author: Berle Mull, MD [facility] Pager: [phone] 04/06/2016 5:22 PM  If 7PM-7AM, please contact night-coverage at [email or web address], password [facility]

## 2016-04-07 ENCOUNTER — Inpatient Hospital Stay (HOSPITAL_COMMUNITY): Payer: PPO

## 2016-04-07 DIAGNOSIS — M47814 Spondylosis without myelopathy or radiculopathy, thoracic region: Secondary | ICD-10-CM | POA: Diagnosis not present

## 2016-04-07 DIAGNOSIS — R29898 Other symptoms and signs involving the musculoskeletal system: Secondary | ICD-10-CM | POA: Diagnosis not present

## 2016-04-07 DIAGNOSIS — G610 Guillain-Barre syndrome: Secondary | ICD-10-CM | POA: Diagnosis not present

## 2016-04-07 DIAGNOSIS — M4802 Spinal stenosis, cervical region: Secondary | ICD-10-CM | POA: Diagnosis not present

## 2016-04-07 LAB — CSF CELL COUNT WITH DIFFERENTIAL
RBC Count, CSF: 0 /mm3
TUBE #: 4
WBC, CSF: 0 /mm3 (ref 0–5)

## 2016-04-07 LAB — PROTIME-INR
INR: 1.34
PROTHROMBIN TIME: 16.7 s — AB (ref 11.4–15.2)

## 2016-04-07 LAB — CBC WITH DIFFERENTIAL/PLATELET
BASOS ABS: 0.1 10*3/uL (ref 0.0–0.1)
BASOS PCT: 0 %
EOS PCT: 2 %
Eosinophils Absolute: 0.3 10*3/uL (ref 0.0–0.7)
HEMATOCRIT: 38.9 % — AB (ref 39.0–52.0)
Hemoglobin: 12.1 g/dL — ABNORMAL LOW (ref 13.0–17.0)
Lymphocytes Relative: 11 %
Lymphs Abs: 1.7 10*3/uL (ref 0.7–4.0)
MCH: 27.9 pg (ref 26.0–34.0)
MCHC: 31.1 g/dL (ref 30.0–36.0)
MCV: 89.6 fL (ref 78.0–100.0)
MONO ABS: 1.2 10*3/uL — AB (ref 0.1–1.0)
MONOS PCT: 8 %
Neutro Abs: 11.8 10*3/uL — ABNORMAL HIGH (ref 1.7–7.7)
Neutrophils Relative %: 79 %
PLATELETS: 226 10*3/uL (ref 150–400)
RBC: 4.34 MIL/uL (ref 4.22–5.81)
RDW: 13.6 % (ref 11.5–15.5)
WBC: 15 10*3/uL — ABNORMAL HIGH (ref 4.0–10.5)

## 2016-04-07 LAB — COMPREHENSIVE METABOLIC PANEL
ALBUMIN: 2.6 g/dL — AB (ref 3.5–5.0)
ALT: 13 U/L — ABNORMAL LOW (ref 17–63)
ANION GAP: 7 (ref 5–15)
AST: 19 U/L (ref 15–41)
Alkaline Phosphatase: 76 U/L (ref 38–126)
BILIRUBIN TOTAL: 0.6 mg/dL (ref 0.3–1.2)
BUN: 10 mg/dL (ref 6–20)
CHLORIDE: 107 mmol/L (ref 101–111)
CO2: 24 mmol/L (ref 22–32)
Calcium: 8.7 mg/dL — ABNORMAL LOW (ref 8.9–10.3)
Creatinine, Ser: 1.47 mg/dL — ABNORMAL HIGH (ref 0.61–1.24)
GFR calc Af Amer: 52 mL/min — ABNORMAL LOW (ref >60)
GFR, EST NON AFRICAN AMERICAN: 45 mL/min — AB (ref >60)
Glucose, Bld: 166 mg/dL — ABNORMAL HIGH (ref 65–99)
POTASSIUM: 3.9 mmol/L (ref 3.5–5.1)
Sodium: 138 mmol/L (ref 135–145)
TOTAL PROTEIN: 5.7 g/dL — AB (ref 6.5–8.1)

## 2016-04-07 LAB — PROTEIN AND GLUCOSE, CSF
Glucose, CSF: 73 mg/dL — ABNORMAL HIGH (ref 40–70)
TOTAL PROTEIN, CSF: 111 mg/dL — AB (ref 15–45)

## 2016-04-07 LAB — MAGNESIUM: MAGNESIUM: 1.9 mg/dL (ref 1.7–2.4)

## 2016-04-07 MED ORDER — LIDOCAINE HCL (PF) 1 % IJ SOLN
5.0000 mL | Freq: Once | INTRAMUSCULAR | Status: AC
  Administered 2016-04-07: 5 mL via INTRADERMAL

## 2016-04-07 MED ORDER — METOPROLOL TARTRATE 50 MG PO TABS
50.0000 mg | ORAL_TABLET | Freq: Two times a day (BID) | ORAL | Status: DC
  Administered 2016-04-07 – 2016-04-14 (×11): 50 mg via ORAL
  Filled 2016-04-07 (×13): qty 1

## 2016-04-07 MED ORDER — GADOBENATE DIMEGLUMINE 529 MG/ML IV SOLN
20.0000 mL | Freq: Once | INTRAVENOUS | Status: AC
  Administered 2016-04-07: 20 mL via INTRAVENOUS

## 2016-04-07 NOTE — Progress Notes (Signed)
Inpatient Rehabilitation  Work up is ongoing.  Will continue to follow for potential need for CIR.    [place] Admissions Coordinator Cell [phone] Office [phone]

## 2016-04-07 NOTE — Progress Notes (Signed)
Subjective: Interval History: He had his LP and tolerated it well.  He thinks he might be slightly better today but not sure.  He has not had his MRI yet.  Objective: Vital signs in last 24 hours: Temp:  [97.9 F (36.6 C)-99.2 F (37.3 C)] 97.9 F (36.6 C) (11/22 0957) Pulse Rate:  [82-88] 82 (11/22 0959) Resp:  [15-20] 18 (11/22 0959) BP: (135-153)/(57-66) 135/57 (11/22 0957) SpO2:  [91 %-94 %] 93 % (11/22 0959)  Intake/Output from previous day: 11/21 0701 - 11/22 0700 In: -  Out: 750 [Urine:750] Intake/Output this shift: No intake/output data recorded. Nutritional status: Diet Heart Room service appropriate? Yes; Fluid consistency: Thin  Neuro Exam MS - normal CN - residual bi-facial weakness Motor - 5-/5 LE prox, 5-/5 distal right and 4+/4 distal left Sensory - slight decreased sensation up to knee DTR - remain absent in lowers  Lab Results:  Recent Labs  04/05/16 0543 04/07/16 0914  WBC 14.7* 15.0*  HGB 12.9* 12.1*  HCT 41.1 38.9*  PLT 238 226  NA 142 138  K 4.2 3.9  CL 109 107  CO2 27 24  GLUCOSE 104* 166*  BUN 12 10  CREATININE 1.48* 1.47*  CALCIUM 8.7* 8.7*   Lipid Panel No results for input(s): CHOL, TRIG, HDL, CHOLHDL, VLDL, LDLCALC in the last 72 hours.  Studies/Results: Mr Jeri Cos Wo Contrast  Result Date: 04/05/2016 CLINICAL DATA:  75 year old male with 2 intracranial meningiomas under surveillance by neurosurgery. Recent hospitalization for UTI and left lower extremity infection. New lower extremity weakness. Initial encounter. EXAM: MRI HEAD WITHOUT AND WITH CONTRAST TECHNIQUE: Multiplanar, multiecho pulse sequences of the brain and surrounding structures were obtained without and with intravenous contrast. CONTRAST:  44mL MULTIHANCE GADOBENATE DIMEGLUMINE 529 MG/ML IV SOLN COMPARISON:  Restaging Brain MRI 10/29/2015 and earlier. Noncontrast head CT 1100 hours today. FINDINGS: Brain: Sequelae of left craniectomy with underlying left superior  frontal gyrus, insula, and operculum encephalomalacia. Solid lobulated right paracentral posterior vertex meningioma encompasses 46 by 29 by 26 mm (AP by transverse by CC) versus 44 by 26 by 23 mm in June. Mild regional mass effect on the medial right parietal and posterior right superior frontal gyrus has not significantly changed. However, underlying white matter edema has progressed (series 7, image 20 today versus series 7, image 18 previously) and appears to affect the motor strip white matter. The second meningioma along the left sphenoid planum at the anterior clinoid process is round and measures 21 by 20 x 13 mm (AP by transverse by CC) and is stable. Stable mass effect on the inferior left frontal gyrus there with no definite associated cerebral edema. Elsewhere stable gray and white matter signal. No restricted diffusion to suggest acute ischemia. Stable ventricle size and configuration. No acute intracranial mass effect. No acute intracranial hemorrhage identified. Negative pituitary and cervicomedullary junction. Vascular: Major intracranial vascular flow voids are stable since June, including evidence of chronic occlusion of the right ICA, and superior sagittal venous sinus at the posterior vertex (series 6, images 9 and 23 respectively). Skull and upper cervical spine: Negative. Normal bone marrow signal. Sinuses/Orbits: Visualized paranasal sinuses and mastoids are stable and well pneumatized. Stable and negative orbits soft tissues. Other: No acute scalp soft tissue findings. Visible internal auditory structures appear normal. IMPRESSION: 1. Mildly enlarged posterior right paracentral vertex meningioma since June, now 46 x 29 x 26 mm (versus 44 x 26 x 23 mm). No significant change in regional mass effect but underlying white  matter edema has increased and appears to affect the medial motor strip. 2. Left anterior clinoid process region 2 cm meningioma has not significantly changed. No associated  edema. 3. 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 Chronic occlusion of the right ICA. Chronic left hemisphere encephalomalacia underlying the craniotomy site craniectomy site. Electronically Signed   By: Genevie Ann M.D.   On: 04/05/2016 16:31   Mr Lumbar Spine Wo Contrast  Result Date: 04/05/2016 CLINICAL DATA:  Numbness in the knees to the toes.  No back pain.  * EXAM: MRI LUMBAR SPINE WITHOUT CONTRAST TECHNIQUE: Multiplanar, multisequence MR imaging of the lumbar spine was performed. No intravenous contrast was administered. COMPARISON:  None. FINDINGS: Segmentation:  Standard. Alignment:  Physiologic. Vertebrae:  No fracture, evidence of discitis, or bone lesion. Conus medullaris: Extends to the L1 level and appears normal. Paraspinal and other soft tissues: No paraspinal abnormality. Partially visualize is a 4.7 cm right renal mass consistent with a cyst. Disc levels: Disc spaces: Degenerative disc disease mild disc height loss at L4-5. T12-L1: No significant disc bulge. No evidence of neural foraminal stenosis. No central canal stenosis. L1-L2: No significant disc bulge. No evidence of neural foraminal stenosis. No central canal stenosis. L2-L3: No significant disc bulge. No evidence of neural foraminal stenosis. No central canal stenosis. L3-L4: Minimal broad-based disc bulge. Mild bilateral facet arthropathy. Mild bilateral lateral recess stenosis. No foraminal stenosis. Mild central canal narrowing. L4-L5: Broad-based disc osteophyte complex. Bilateral moderate facet arthropathy. Mild spinal stenosis and bilateral lateral recess stenosis. No foraminal stenosis. L5-S1: Minimal broad-based disc bulge. Moderate bilateral facet arthropathy. No evidence of neural foraminal stenosis. No central canal stenosis. IMPRESSION: 1. At L3-4 there is minimal broad-based disc bulge. Mild bilateral facet arthropathy. Mild bilateral lateral recess stenosis. Mild central canal  narrowing. 2. At L4-5 there is a broad-based disc osteophyte complex. Bilateral moderate facet arthropathy. Mild spinal stenosis and bilateral lateral recess stenosis. 3. At L5-S1 there is minimal broad-based disc bulge. Moderate bilateral facet arthropathy. Electronically Signed   By: Kathreen Devoid   On: 04/05/2016 16:12   Dg Fluoro Guide Lumbar Puncture  Result Date: 04/07/2016 CLINICAL DATA:  Guillain-Barre syndrome. EXAM: DIAGNOSTIC LUMBAR PUNCTURE UNDER FLUOROSCOPIC GUIDANCE FLUOROSCOPY TIME:  Fluoroscopy Time:  0 minutes, 12 seconds Radiation Exposure Index (if provided by the fluoroscopic device): 1.8 mGy Number of Acquired Spot Images: 0 PROCEDURE: I discussed the risks (including hemorrhage, infection, headache, and nerve damage, among others), benefits, and alternatives to fluoroscopically guided lumbar puncture with the patient. We specifically discussed the high technical likelihood of success of the procedure. The patient understood and elected to undergo the procedure. Standard time-out was employed. Following sterile skin prep and local anesthetic administration consisting of 1 percent lidocaine, a 22 gauge spinal needle was advanced without difficulty into the thecal sac at the at the L2-3 level. Clear CSF was returned. Opening pressure was 10 cm water. 12 cc of clear CSF was collected. The needle was subsequently removed and the skin cleansed and bandaged. No immediate complications were observed. IMPRESSION: 1. Successful lumbar puncture at the L2- 3 level with yielding 12 cc of clear CSF. Opening pressure was 10 cm of water. Electronically Signed   By: Van Clines M.D.   On: 04/07/2016 14:29    Medications: I have reviewed the patient's current medications.  Assessment/Plan: 1. Lower extremity weakness/numbness GBS seems unlikely.  Will await results of LP and MRI c and t-spine to confirm before  deciding on treatment.  Patient will need on-going PT/OT   LOS: 3 days   [name]

## 2016-04-07 NOTE — Progress Notes (Signed)
Pt  Able to do NIF and VC with great effort. VC 1.28L NIF - 50

## 2016-04-07 NOTE — Progress Notes (Signed)
[facility] Progress Note  Patient: [name] YKD:[id number]   PCP: Irven Shelling, MD DOB: March 26, 1941   DOA: 04/04/2016   DOS: 04/07/2016   Date of Service: the patient was seen and examined on 04/07/2016  Brief hospital course: Pt. with PMH of CKD, CVA, history of GBS, A. fib on chronic anti-coagulation, meningioma, HTN, HLD; admitted on 04/04/2016, with complaint of recurrent fall, was found to have generalized weakness versus suspected GBS. NEuro following, MRIs  Unremarkable, workup ongoing  Assessment and Plan: 1. Bilateral leg weakness/numbness -remote h/o GBS 19 years ago -MRI brain -chronic occlusion of the right ICA and chronic left hemispheric and to follow malacia underlying craniotomy site, It also shows mildly enlarged meningioma. MRI lumbar spine also unremarkable. -Neurology following -LP under fluoroscopy today -warfarin on hold -PT consulting, CIR recommended  2. A. fib. -warfarin on hold for LP, resume post procedure -resume metoprolol, continue Cardizem.  3. Chronic kidney disease. Stage III. Continue to closely monitor.  4. Subacute urinary retention. -remains with foley for last 3weeks since last discharge, Seen at [facility] 2 weeks ago, failed VOiding trial and foley was re-inserted -continue Flomax  DVT Prophylaxis: INR up, holding warfarib  Advance goals of care discussion: Full code Family Communication: no family was present at bedside Disposition: may need rehab, ? CIR  Consultants: Neurology Procedures: None  Antibiotics: Anti-infectives    None      Subjective: Had a fall last night while trying to go to the commode. Again he had significant weakness of his legs that gave him lost his balance. Denies any head injury or neck injury.  Objective: Physical Exam: Vitals:   04/07/16 0112 04/07/16 0500 04/07/16 0957 04/07/16 0959  BP: (!) 146/59 138/64 (!) 135/57   Pulse: 88 87 82 82  Resp: 20 18 20 18   Temp: 99 F (37.2  C) 98.9 F (37.2 C) 97.9 F (36.6 C)   TempSrc: Oral Oral Oral   SpO2: 92% 94% 93% 93%  Weight:      Height:        Intake/Output Summary (Last 24 hours) at 04/07/16 1256 Last data filed at 04/06/16 2151  Gross per 24 hour  Intake                0 ml  Output              450 ml  Net             -450 ml   Filed Weights   04/04/16 1708 04/04/16 2218  Weight: 98.9 kg (218 lb) 97.5 kg (215 lb)    General: Alert, Awake and Oriented to Time, Place and Person. Appear in mild distress, affect appropriate Eyes: PERRL, Conjunctiva normal ENT: Oral Mucosa clear moist. Neck: difficult to assess JVD, no Abnormal Mass Or lumps Cardiovascular: S1 and S2 Present, no Murmur, Respiratory: Bilateral Air entry equal and Decreased, no use of accessory muscle, Clear to Auscultation, no Crackles, no wheezes Abdomen: Bowel Sound present, Soft and no tenderness Skin: no redness, no Rash, no induration Extremities: no Pedal edema, no calf tenderness Neurologic: 4/5  bilateral leg weakness, decreased light tough knee down  Data Reviewed: CBC:  Recent Labs Lab 04/04/16 1848 04/05/16 0543 04/07/16 0914  WBC 14.7* 14.7* 15.0*  NEUTROABS 11.2*  --  11.8*  HGB 14.1 12.9* 12.1*  HCT 44.4 41.1 38.9*  MCV 89.5 90.1 89.6  PLT 248 238 397   Basic Metabolic Panel:  Recent Labs Lab 04/04/16 1848  04/05/16 0543 04/07/16 0914  NA 141 142 138  K 4.2 4.2 3.9  CL 106 109 107  CO2 25 27 24   GLUCOSE 117* 104* 166*  BUN 14 12 10   CREATININE 1.57* 1.48* 1.47*  CALCIUM 9.1 8.7* 8.7*  MG 2.3  --  1.9    Liver Function Tests:  Recent Labs Lab 04/07/16 0914  AST 19  ALT 13*  ALKPHOS 76  BILITOT 0.6  PROT 5.7*  ALBUMIN 2.6*   No results for input(s): LIPASE, AMYLASE in the last 168 hours. No results for input(s): AMMONIA in the last 168 hours. Coagulation Profile:  Recent Labs Lab 04/04/16 1848 04/05/16 0543 04/06/16 0303 04/07/16 0914  INR 2.33 2.23 1.86 1.34   Cardiac  Enzymes: No results for input(s): CKTOTAL, CKMB, CKMBINDEX, TROPONINI in the last 168 hours. BNP (last 3 results) No results for input(s): PROBNP in the last 8760 hours.  CBG: No results for input(s): GLUCAP in the last 168 hours.  Studies: No results found.   Scheduled Meds: . diltiazem  120 mg Oral Daily  . rosuvastatin  40 mg Oral q1800  . sodium chloride flush  3 mL Intravenous Q12H  . tamsulosin  0.4 mg Oral QPC supper  . vitamin B-12  1,000 mcg Oral Once per day on Wed Sat   Continuous Infusions: PRN Meds: acetaminophen, ondansetron **OR** ondansetron (ZOFRAN) IV  Time spent: 30 minutes  Author: Domenic Polite, MD [facility] Pager: [phone] 04/07/2016 12:56 PM  If 7PM-7AM, please contact night-coverage at [email or web address], password [email or web address]

## 2016-04-07 NOTE — Procedures (Signed)
CLINICAL DATA: [Guillain-Barre syndrome.] EXAM: DIAGNOSTIC LUMBAR PUNCTURE UNDER FLUOROSCOPIC GUIDANCE FLUOROSCOPY TIME: Fluoroscopy Time: [0 minutes, 12 seconds] Radiation Exposure Index (if provided by the fluoroscopic device): [1.8 mGy] Number of Acquired Spot Images: [0] PROCEDURE: I discussed the risks (including hemorrhage, infection, headache, and nerve damage, among others), benefits, and alternatives to fluoroscopically guided lumbar puncture with the patient.  We specifically discussed the high technical likelihood of success of the procedure. The patient understood and elected to undergo the procedure.   Standard time-out was employed. Following sterile skin prep and local anesthetic administration consisting of 1 percent lidocaine, a 22 gauge spinal needle was advanced without difficulty into the thecal sac at the at the [L2-3] level. Clear CSF was returned.  Opening pressure was [10 cm water].    12 cc of clear CSF was collected. The needle was subsequently removed and the skin cleansed and bandaged. No immediate complications were observed.     IMPRESSION: [ 1. Successful lumbar puncture at the L2- 3 level with yielding 12 cc of clear CSF. Opening pressure was 10 cm of water.  ]

## 2016-04-07 NOTE — Procedures (Signed)
Pt is unavailable.  RT cannot perform NIF or VC.

## 2016-04-07 NOTE — Care Management Important Message (Signed)
Important Message  Patient Details  Name: [name] MRN: [id number] Date of Birth: 1940/08/12   Medicare Important Message Given:  Yes    Chenita Ruda 04/07/2016, 1:25 PM

## 2016-04-08 DIAGNOSIS — R296 Repeated falls: Secondary | ICD-10-CM | POA: Diagnosis not present

## 2016-04-08 DIAGNOSIS — Z8673 Personal history of transient ischemic attack (TIA), and cerebral infarction without residual deficits: Secondary | ICD-10-CM | POA: Diagnosis not present

## 2016-04-08 DIAGNOSIS — N183 Chronic kidney disease, stage 3 (moderate): Secondary | ICD-10-CM | POA: Diagnosis not present

## 2016-04-08 DIAGNOSIS — R29898 Other symptoms and signs involving the musculoskeletal system: Secondary | ICD-10-CM | POA: Diagnosis not present

## 2016-04-08 DIAGNOSIS — I129 Hypertensive chronic kidney disease with stage 1 through stage 4 chronic kidney disease, or unspecified chronic kidney disease: Secondary | ICD-10-CM | POA: Diagnosis not present

## 2016-04-08 DIAGNOSIS — G252 Other specified forms of tremor: Secondary | ICD-10-CM | POA: Diagnosis not present

## 2016-04-08 DIAGNOSIS — I6521 Occlusion and stenosis of right carotid artery: Secondary | ICD-10-CM | POA: Diagnosis not present

## 2016-04-08 DIAGNOSIS — G610 Guillain-Barre syndrome: Secondary | ICD-10-CM | POA: Diagnosis not present

## 2016-04-08 DIAGNOSIS — I480 Paroxysmal atrial fibrillation: Secondary | ICD-10-CM | POA: Diagnosis not present

## 2016-04-08 DIAGNOSIS — Z7901 Long term (current) use of anticoagulants: Secondary | ICD-10-CM | POA: Diagnosis not present

## 2016-04-08 DIAGNOSIS — Z87891 Personal history of nicotine dependence: Secondary | ICD-10-CM | POA: Diagnosis not present

## 2016-04-08 DIAGNOSIS — E785 Hyperlipidemia, unspecified: Secondary | ICD-10-CM | POA: Diagnosis not present

## 2016-04-08 DIAGNOSIS — D329 Benign neoplasm of meninges, unspecified: Secondary | ICD-10-CM | POA: Diagnosis not present

## 2016-04-08 LAB — PROTIME-INR
INR: 1.29
Prothrombin Time: 16.2 seconds — ABNORMAL HIGH (ref 11.4–15.2)

## 2016-04-08 MED ORDER — WARFARIN SODIUM 5 MG PO TABS
5.0000 mg | ORAL_TABLET | Freq: Once | ORAL | Status: AC
  Administered 2016-04-08: 5 mg via ORAL
  Filled 2016-04-08: qty 1

## 2016-04-08 MED ORDER — WARFARIN - PHARMACIST DOSING INPATIENT
Freq: Every day | Status: DC
  Administered 2016-04-09: 18:00:00

## 2016-04-08 MED ORDER — IMMUNE GLOBULIN (HUMAN) 20 GM/200ML IV SOLN
400.0000 mg/kg | INTRAVENOUS | Status: AC
Start: 2016-04-08 — End: 2016-04-12
  Administered 2016-04-08 – 2016-04-12 (×5): 40 g via INTRAVENOUS
  Filled 2016-04-08 (×8): qty 400

## 2016-04-08 NOTE — Discharge Instructions (Signed)

## 2016-04-08 NOTE — Progress Notes (Addendum)
[place] for warfarin Indication: atrial fibrillation and CVA  Allergies  Allergen Reactions  . Asa [Aspirin] Other (See Comments)    Severe skin bruising   . Immune Globulins Other (See Comments)    Tetanus Immunes Globulins    (  Pt. Cannot take the Flu shot ) [name]     Patient Measurements: Height: 5\' 11"  (180.3 cm) Weight: 215 lb (97.5 kg) IBW/kg (Calculated) : 75.3   Vital Signs: Temp: 98.2 F (36.8 C) (11/23 0700) Temp Source: Oral (11/23 0700) BP: 132/60 (11/23 0700) Pulse Rate: 65 (11/23 0700)  Labs:  Recent Labs  04/06/16 0303 04/07/16 0914 04/08/16 0316  HGB  --  12.1*  --   HCT  --  38.9*  --   PLT  --  226  --   LABPROT 21.7* 16.7* 16.2*  INR 1.86 1.34 1.29  CREATININE  --  1.47*  --     Estimated Creatinine Clearance: 51.7 mL/min (by C-G formula based on SCr of 1.47 mg/dL (H)).  Assessment: 75 yo F on warfarin PTA for CVA and atrial fibrillation presents to ED with leg weakness with concern for Guillian-Barr syndrome (patient has history). Warfarin initially held due to need for LP. LP performed on 11/22 at 1426. Spoke with Dr. Broadus John regarding warfarin and states okay to resume today. INR subtherapeutic at 1.29. CBC stable. No overt s/s bleeding noted.   [PTA dose warfarin 5 mg daily]  Goal of Therapy:  INR 2-3 Monitor platelets by anticoagulation protocol: Yes   Plan:  Warfarin 5mg  PO x1 tonight  INR daily F/U LP/MRI results  Monitor for s/s bleeding   Argie Ramming, PharmD Pharmacy Resident  Pager [phone] 04/08/16 9:38 AM

## 2016-04-08 NOTE — Progress Notes (Signed)
Subjective: Interval History: Still with weakness in both legs without improvement.  The results of the LP/CSF were reviewed along with the MRI results.  The CSF findings are consistent with GBS.  Objective: Vital signs in last 24 hours: Temp:  [97.8 F (36.6 C)-98.4 F (36.9 C)] 98 F (36.7 C) (11/23 1416) Pulse Rate:  [62-102] 62 (11/23 1416) Resp:  [18-20] 19 (11/23 1416) BP: (132-148)/(55-73) 148/69 (11/23 1416) SpO2:  [94 %-97 %] 94 % (11/23 1416)  Intake/Output from previous day: 11/22 0701 - 11/23 0700 In: -  Out: 1550 [Urine:1550] Intake/Output this shift: Total I/O In: -  Out: 800 [Urine:800] Nutritional status: Diet Heart Room service appropriate? Yes; Fluid consistency: Thin  Neuro Exam MS - normal CN - stable bi-facial weakness Motor - still with weakness in both legs 4/5 right and 4-/5 left distal more so than proximal DTRs - remain absent in lowers  Lab Results: CSF: protein - 111, RBC and WBC are zero  Recent Labs  04/07/16 0914  WBC 15.0*  HGB 12.1*  HCT 38.9*  PLT 226  NA 138  K 3.9  CL 107  CO2 24  GLUCOSE 166*  BUN 10  CREATININE 1.47*  CALCIUM 8.7*   Lipid Panel No results for input(s): CHOL, TRIG, HDL, CHOLHDL, VLDL, LDLCALC in the last 72 hours.  Studies/Results: Mr Cervical Spine W Wo Contrast  Result Date: 04/07/2016 CLINICAL DATA:  Lower limb weakness. New versus recrudescent weakness from Guillain-Barre syndrome 1999. History of stroke, carotid artery occlusion, hypertension, hyperlipidemia and intracranial meningioma. EXAM: MRI CERVICAL AND THORACIC SPINE WITHOUT AND WITH CONTRAST TECHNIQUE: Multiplanar and multiecho pulse sequences of the cervical spine, to include the craniocervical junction and cervicothoracic junction, and thoracic spine, were obtained without and with intravenous contrast. CONTRAST:  62mL MULTIHANCE GADOBENATE DIMEGLUMINE 529 MG/ML IV SOLN COMPARISON:  MRI head and lumbar spine April 05, 2016 FINDINGS: MRI  CERVICAL SPINE FINDINGS ALIGNMENT: Straightened cervical lordosis.  No malalignment. VERTEBRAE/DISCS: Vertebral bodies are intact. Intervertebral disc morphology's are normal, mild desiccation of all cervical discs. Mild ventral endplate spurring D7-8 through C6-7. No abnormal bone marrow signal. No abnormal osseous or intradiscal enhancement. CORD:Cervical spinal cord is normal morphology and signal characteristics from the cervicomedullary junction to level of T1-2, the most caudal well visualized level. No abnormal cord, leptomeningeal or epidural enhancement. POSTERIOR FOSSA, VERTEBRAL ARTERIES, PARASPINAL TISSUES: No MR findings of ligamentous injury. Vertebral artery flow voids present. Included posterior fossa and paraspinal soft tissues are normal. DISC LEVELS: C2-3: No disc bulge, canal stenosis nor neural foraminal narrowing. Mild facet arthropathy. C3-4: Annular bulging. Small LEFT subarticular disc protrusion. Uncovertebral hypertrophy and mild facet arthropathy. No canal stenosis. Moderate to severe LEFT neural foraminal narrowing. C4-5: Annular bulging, uncovertebral hypertrophy and LEFT greater than RIGHT moderate facet arthropathy. No canal stenosis. Moderate bilateral neural foraminal narrowing. C5-6: Annular bulging eccentric laterally, small LEFT subarticular disc protrusion. Uncovertebral hypertrophy and mild facet arthropathy. Mild canal stenosis. Moderate RIGHT, moderate to severe LEFT neural foraminal narrowing. C6-7: Uncovertebral hypertrophy and mild facet arthropathy. No canal stenosis or neural foraminal narrowing. C7-T1: No disc bulge, canal stenosis nor neural foraminal narrowing. MRI THORACIC SPINE FINDINGS- Mild motion degraded examination. ALIGNMENT: Maintenance of the thoracic kyphosis. No malalignment. VERTEBRAE/DISCS: Vertebral bodies are intact. Intervertebral discs morphology are normal, mild disc desiccation. Multilevel lower mild subacute discogenic endplate changes without  abnormal bone marrow signal. No abnormal osseous or intradiscal enhancement. Scattered old Schmorl's nodes. CORD: Thoracic spinal cord is normal morphology and signal characteristics  to the level of the conus medullaris which terminates at T12-L1. PREVERTEBRAL AND PARASPINAL SOFT TISSUES: At least 3.9 cm partially imaged RIGHT renal cyst. Dependent atelectasis. DISC LEVELS: No significant disc bulge, canal stenosis or neural foraminal narrowing at any level. Mild T10-11 facet arthropathy and ligamentum flavum redundancy. IMPRESSION: MRI CERVICAL SPINE:  Normal MRI of the cervical spinal cord. Degenerative change results in mild canal stenosis at C5-6. Neural foraminal narrowing C3-4 thru C5-6: Moderate to severe on the LEFT at C3-4 and C5-6. MRI THORACIC SPINE:  Normal MRI of the thoracic spinal cord. Early degenerative change of the thoracic spine without neurocompression. Electronically Signed   By: Elon Alas M.D.   On: 04/07/2016 21:20   Mr Thoracic Spine W Wo Contrast  Result Date: 04/07/2016 CLINICAL DATA:  Lower limb weakness. New versus recrudescent weakness from Guillain-Barre syndrome 1999. History of stroke, carotid artery occlusion, hypertension, hyperlipidemia and intracranial meningioma. EXAM: MRI CERVICAL AND THORACIC SPINE WITHOUT AND WITH CONTRAST TECHNIQUE: Multiplanar and multiecho pulse sequences of the cervical spine, to include the craniocervical junction and cervicothoracic junction, and thoracic spine, were obtained without and with intravenous contrast. CONTRAST:  29mL MULTIHANCE GADOBENATE DIMEGLUMINE 529 MG/ML IV SOLN COMPARISON:  MRI head and lumbar spine April 05, 2016 FINDINGS: MRI CERVICAL SPINE FINDINGS ALIGNMENT: Straightened cervical lordosis.  No malalignment. VERTEBRAE/DISCS: Vertebral bodies are intact. Intervertebral disc morphology's are normal, mild desiccation of all cervical discs. Mild ventral endplate spurring J8-5 through C6-7. No abnormal bone marrow  signal. No abnormal osseous or intradiscal enhancement. CORD:Cervical spinal cord is normal morphology and signal characteristics from the cervicomedullary junction to level of T1-2, the most caudal well visualized level. No abnormal cord, leptomeningeal or epidural enhancement. POSTERIOR FOSSA, VERTEBRAL ARTERIES, PARASPINAL TISSUES: No MR findings of ligamentous injury. Vertebral artery flow voids present. Included posterior fossa and paraspinal soft tissues are normal. DISC LEVELS: C2-3: No disc bulge, canal stenosis nor neural foraminal narrowing. Mild facet arthropathy. C3-4: Annular bulging. Small LEFT subarticular disc protrusion. Uncovertebral hypertrophy and mild facet arthropathy. No canal stenosis. Moderate to severe LEFT neural foraminal narrowing. C4-5: Annular bulging, uncovertebral hypertrophy and LEFT greater than RIGHT moderate facet arthropathy. No canal stenosis. Moderate bilateral neural foraminal narrowing. C5-6: Annular bulging eccentric laterally, small LEFT subarticular disc protrusion. Uncovertebral hypertrophy and mild facet arthropathy. Mild canal stenosis. Moderate RIGHT, moderate to severe LEFT neural foraminal narrowing. C6-7: Uncovertebral hypertrophy and mild facet arthropathy. No canal stenosis or neural foraminal narrowing. C7-T1: No disc bulge, canal stenosis nor neural foraminal narrowing. MRI THORACIC SPINE FINDINGS- Mild motion degraded examination. ALIGNMENT: Maintenance of the thoracic kyphosis. No malalignment. VERTEBRAE/DISCS: Vertebral bodies are intact. Intervertebral discs morphology are normal, mild disc desiccation. Multilevel lower mild subacute discogenic endplate changes without abnormal bone marrow signal. No abnormal osseous or intradiscal enhancement. Scattered old Schmorl's nodes. CORD: Thoracic spinal cord is normal morphology and signal characteristics to the level of the conus medullaris which terminates at T12-L1. PREVERTEBRAL AND PARASPINAL SOFT TISSUES: At  least 3.9 cm partially imaged RIGHT renal cyst. Dependent atelectasis. DISC LEVELS: No significant disc bulge, canal stenosis or neural foraminal narrowing at any level. Mild T10-11 facet arthropathy and ligamentum flavum redundancy. IMPRESSION: MRI CERVICAL SPINE:  Normal MRI of the cervical spinal cord. Degenerative change results in mild canal stenosis at C5-6. Neural foraminal narrowing C3-4 thru C5-6: Moderate to severe on the LEFT at C3-4 and C5-6. MRI THORACIC SPINE:  Normal MRI of the thoracic spinal cord. Early degenerative change of the thoracic spine without neurocompression.  Electronically Signed   By: Elon Alas M.D.   On: 04/07/2016 21:20   Dg Fluoro Guide Lumbar Puncture  Result Date: 04/07/2016 CLINICAL DATA:  Guillain-Barre syndrome. EXAM: DIAGNOSTIC LUMBAR PUNCTURE UNDER FLUOROSCOPIC GUIDANCE FLUOROSCOPY TIME:  Fluoroscopy Time:  0 minutes, 12 seconds Radiation Exposure Index (if provided by the fluoroscopic device): 1.8 mGy Number of Acquired Spot Images: 0 PROCEDURE: I discussed the risks (including hemorrhage, infection, headache, and nerve damage, among others), benefits, and alternatives to fluoroscopically guided lumbar puncture with the patient. We specifically discussed the high technical likelihood of success of the procedure. The patient understood and elected to undergo the procedure. Standard time-out was employed. Following sterile skin prep and local anesthetic administration consisting of 1 percent lidocaine, a 22 gauge spinal needle was advanced without difficulty into the thecal sac at the at the L2-3 level. Clear CSF was returned. Opening pressure was 10 cm water. 12 cc of clear CSF was collected. The needle was subsequently removed and the skin cleansed and bandaged. No immediate complications were observed. IMPRESSION: 1. Successful lumbar puncture at the L2- 3 level with yielding 12 cc of clear CSF. Opening pressure was 10 cm of water. Electronically Signed   By:  Van Clines M.D.   On: 04/07/2016 14:29    Medications: I have reviewed the patient's current medications.  Assessment/Plan: 1. Probable GBS Given the albumino-cytologic dissociation, the patient most likely has another bout of GBS.  Treatment options were discussed and they would like to proceed with IVIG.  Will monitor daily weights and I&O's for excessive fluid retention.  Patient may need Lasix to prevent fluid retention.  2. Mult medical probs   LOS: 4 days   Doren Custard, MD Neurology

## 2016-04-08 NOTE — Progress Notes (Signed)
Pt had good effort on NIF and VC NIF - 34 VC 1.10

## 2016-04-08 NOTE — Progress Notes (Addendum)
[facility] Progress Note  Patient: [name] TOI:[id number]   PCP: Irven Shelling, MD DOB: 07-Jan-1941   DOA: 04/04/2016   DOS: 04/08/2016   Date of Service: the patient was seen and examined on 04/08/2016  Brief hospital course: Pt. with PMH of CKD, CVA, history of GBS, A. fib on chronic anti-coagulation, meningioma, HTN, HLD; admitted on 04/04/2016, with complaint of recurrent fall, was found to have generalized weakness versus suspected GBS. NEuro following, MRIs  Unremarkable, workup ongoing  Assessment and Plan: 1. Bilateral leg weakness/numbness -remote h/o GBS 19 years ago, ? recurrence -MRI brain -chronic occlusion of the right ICA and chronic left hemispheric and to follow malacia underlying craniotomy site, It also shows mildly enlarged meningioma. MRI cervical, thoracic and lumbar spine also unremarkable. -Neurology following -LP 11/22 with Protein of 111 and 0 WBCs-d/w Neurology they will consider IVIG -resume warfarin today -PT consulting, CIR recommended  2. A. fib. -warfarin resumed post LP -resume metoprolol, continue Cardizem.  3. Chronic kidney disease. Stage III. Continue to closely monitor.  4. Subacute urinary retention. -remains with foley for last 3weeks since last discharge, Seen at [facility] 2 weeks ago, failed Voiding trial and foley was re-inserted -continue Flomax  DVT Prophylaxis: warfarin  Advance goals of care discussion: Full code Family Communication: no family was present at bedside Disposition: may need rehab, ? CIR  Consultants: Neurology Procedures: None  Antibiotics: Anti-infectives    None      Subjective: unchanged still has lower leg numbness and weakness  Objective: Physical Exam: Vitals:   04/07/16 2145 04/08/16 0200 04/08/16 0700 04/08/16 1016  BP: (!) 145/73 134/60 132/60 (!) 140/55  Pulse: (!) 102 69 65 69  Resp: 20 18 18 18   Temp: 98.4 F (36.9 C) 97.8 F (36.6 C) 98.2 F (36.8 C) 97.9 F (36.6  C)  TempSrc: Oral Oral Oral Oral  SpO2: 95% 96% 97% 94%  Weight:      Height:        Intake/Output Summary (Last 24 hours) at 04/08/16 1243 Last data filed at 04/08/16 1237  Gross per 24 hour  Intake                0 ml  Output             2350 ml  Net            -2350 ml   Filed Weights   04/04/16 1708 04/04/16 2218  Weight: 98.9 kg (218 lb) 97.5 kg (215 lb)    General: Alert, Awake and Oriented to Time, Place and Person. Appear in mild distress, affect appropriate Eyes: PERRL, Conjunctiva normal ENT: Oral Mucosa clear moist. Neck: difficult to assess JVD, no Abnormal Mass Or lumps Cardiovascular: S1 and S2 Present, no Murmur, Respiratory: Bilateral Air entry equal and Decreased, no use of accessory muscle, Clear to Auscultation, no Crackles, no wheezes Abdomen: Bowel Sound present, Soft and no tenderness Skin: no redness, no Rash, no induration Extremities: no Pedal edema, no calf tenderness Neurologic: 4/5  bilateral leg weakness, decreased light tough knee down  Data Reviewed: CBC:  Recent Labs Lab 04/04/16 1848 04/05/16 0543 04/07/16 0914  WBC 14.7* 14.7* 15.0*  NEUTROABS 11.2*  --  11.8*  HGB 14.1 12.9* 12.1*  HCT 44.4 41.1 38.9*  MCV 89.5 90.1 89.6  PLT 248 238 833   Basic Metabolic Panel:  Recent Labs Lab 04/04/16 1848 04/05/16 0543 04/07/16 0914  NA 141 142 138  K 4.2 4.2 3.9  CL 106 109 107  CO2 25 27 24   GLUCOSE 117* 104* 166*  BUN 14 12 10   CREATININE 1.57* 1.48* 1.47*  CALCIUM 9.1 8.7* 8.7*  MG 2.3  --  1.9    Liver Function Tests:  Recent Labs Lab 04/07/16 0914  AST 19  ALT 13*  ALKPHOS 76  BILITOT 0.6  PROT 5.7*  ALBUMIN 2.6*   No results for input(s): LIPASE, AMYLASE in the last 168 hours. No results for input(s): AMMONIA in the last 168 hours. Coagulation Profile:  Recent Labs Lab 04/04/16 1848 04/05/16 0543 04/06/16 0303 04/07/16 0914 04/08/16 0316  INR 2.33 2.23 1.86 1.34 1.29   Cardiac Enzymes: No results  for input(s): CKTOTAL, CKMB, CKMBINDEX, TROPONINI in the last 168 hours. BNP (last 3 results) No results for input(s): PROBNP in the last 8760 hours.  CBG: No results for input(s): GLUCAP in the last 168 hours.  Studies: Mr Cervical Spine W Wo Contrast  Result Date: 04/07/2016 CLINICAL DATA:  Lower limb weakness. New versus recrudescent weakness from Guillain-Barre syndrome 1999. History of stroke, carotid artery occlusion, hypertension, hyperlipidemia and intracranial meningioma. EXAM: MRI CERVICAL AND THORACIC SPINE WITHOUT AND WITH CONTRAST TECHNIQUE: Multiplanar and multiecho pulse sequences of the cervical spine, to include the craniocervical junction and cervicothoracic junction, and thoracic spine, were obtained without and with intravenous contrast. CONTRAST:  97mL MULTIHANCE GADOBENATE DIMEGLUMINE 529 MG/ML IV SOLN COMPARISON:  MRI head and lumbar spine April 05, 2016 FINDINGS: MRI CERVICAL SPINE FINDINGS ALIGNMENT: Straightened cervical lordosis.  No malalignment. VERTEBRAE/DISCS: Vertebral bodies are intact. Intervertebral disc morphology's are normal, mild desiccation of all cervical discs. Mild ventral endplate spurring Z6-1 through C6-7. No abnormal bone marrow signal. No abnormal osseous or intradiscal enhancement. CORD:Cervical spinal cord is normal morphology and signal characteristics from the cervicomedullary junction to level of T1-2, the most caudal well visualized level. No abnormal cord, leptomeningeal or epidural enhancement. POSTERIOR FOSSA, VERTEBRAL ARTERIES, PARASPINAL TISSUES: No MR findings of ligamentous injury. Vertebral artery flow voids present. Included posterior fossa and paraspinal soft tissues are normal. DISC LEVELS: C2-3: No disc bulge, canal stenosis nor neural foraminal narrowing. Mild facet arthropathy. C3-4: Annular bulging. Small LEFT subarticular disc protrusion. Uncovertebral hypertrophy and mild facet arthropathy. No canal stenosis. Moderate to severe  LEFT neural foraminal narrowing. C4-5: Annular bulging, uncovertebral hypertrophy and LEFT greater than RIGHT moderate facet arthropathy. No canal stenosis. Moderate bilateral neural foraminal narrowing. C5-6: Annular bulging eccentric laterally, small LEFT subarticular disc protrusion. Uncovertebral hypertrophy and mild facet arthropathy. Mild canal stenosis. Moderate RIGHT, moderate to severe LEFT neural foraminal narrowing. C6-7: Uncovertebral hypertrophy and mild facet arthropathy. No canal stenosis or neural foraminal narrowing. C7-T1: No disc bulge, canal stenosis nor neural foraminal narrowing. MRI THORACIC SPINE FINDINGS- Mild motion degraded examination. ALIGNMENT: Maintenance of the thoracic kyphosis. No malalignment. VERTEBRAE/DISCS: Vertebral bodies are intact. Intervertebral discs morphology are normal, mild disc desiccation. Multilevel lower mild subacute discogenic endplate changes without abnormal bone marrow signal. No abnormal osseous or intradiscal enhancement. Scattered old Schmorl's nodes. CORD: Thoracic spinal cord is normal morphology and signal characteristics to the level of the conus medullaris which terminates at T12-L1. PREVERTEBRAL AND PARASPINAL SOFT TISSUES: At least 3.9 cm partially imaged RIGHT renal cyst. Dependent atelectasis. DISC LEVELS: No significant disc bulge, canal stenosis or neural foraminal narrowing at any level. Mild T10-11 facet arthropathy and ligamentum flavum redundancy. IMPRESSION: MRI CERVICAL SPINE:  Normal MRI of the cervical spinal cord. Degenerative change results in mild canal stenosis at C5-6. Neural foraminal  narrowing C3-4 thru C5-6: Moderate to severe on the LEFT at C3-4 and C5-6. MRI THORACIC SPINE:  Normal MRI of the thoracic spinal cord. Early degenerative change of the thoracic spine without neurocompression. Electronically Signed   By: Elon Alas M.D.   On: 04/07/2016 21:20   Mr Thoracic Spine W Wo Contrast  Result Date:  04/07/2016 CLINICAL DATA:  Lower limb weakness. New versus recrudescent weakness from Guillain-Barre syndrome 1999. History of stroke, carotid artery occlusion, hypertension, hyperlipidemia and intracranial meningioma. EXAM: MRI CERVICAL AND THORACIC SPINE WITHOUT AND WITH CONTRAST TECHNIQUE: Multiplanar and multiecho pulse sequences of the cervical spine, to include the craniocervical junction and cervicothoracic junction, and thoracic spine, were obtained without and with intravenous contrast. CONTRAST:  37mL MULTIHANCE GADOBENATE DIMEGLUMINE 529 MG/ML IV SOLN COMPARISON:  MRI head and lumbar spine April 05, 2016 FINDINGS: MRI CERVICAL SPINE FINDINGS ALIGNMENT: Straightened cervical lordosis.  No malalignment. VERTEBRAE/DISCS: Vertebral bodies are intact. Intervertebral disc morphology's are normal, mild desiccation of all cervical discs. Mild ventral endplate spurring N6-2 through C6-7. No abnormal bone marrow signal. No abnormal osseous or intradiscal enhancement. CORD:Cervical spinal cord is normal morphology and signal characteristics from the cervicomedullary junction to level of T1-2, the most caudal well visualized level. No abnormal cord, leptomeningeal or epidural enhancement. POSTERIOR FOSSA, VERTEBRAL ARTERIES, PARASPINAL TISSUES: No MR findings of ligamentous injury. Vertebral artery flow voids present. Included posterior fossa and paraspinal soft tissues are normal. DISC LEVELS: C2-3: No disc bulge, canal stenosis nor neural foraminal narrowing. Mild facet arthropathy. C3-4: Annular bulging. Small LEFT subarticular disc protrusion. Uncovertebral hypertrophy and mild facet arthropathy. No canal stenosis. Moderate to severe LEFT neural foraminal narrowing. C4-5: Annular bulging, uncovertebral hypertrophy and LEFT greater than RIGHT moderate facet arthropathy. No canal stenosis. Moderate bilateral neural foraminal narrowing. C5-6: Annular bulging eccentric laterally, small LEFT subarticular disc  protrusion. Uncovertebral hypertrophy and mild facet arthropathy. Mild canal stenosis. Moderate RIGHT, moderate to severe LEFT neural foraminal narrowing. C6-7: Uncovertebral hypertrophy and mild facet arthropathy. No canal stenosis or neural foraminal narrowing. C7-T1: No disc bulge, canal stenosis nor neural foraminal narrowing. MRI THORACIC SPINE FINDINGS- Mild motion degraded examination. ALIGNMENT: Maintenance of the thoracic kyphosis. No malalignment. VERTEBRAE/DISCS: Vertebral bodies are intact. Intervertebral discs morphology are normal, mild disc desiccation. Multilevel lower mild subacute discogenic endplate changes without abnormal bone marrow signal. No abnormal osseous or intradiscal enhancement. Scattered old Schmorl's nodes. CORD: Thoracic spinal cord is normal morphology and signal characteristics to the level of the conus medullaris which terminates at T12-L1. PREVERTEBRAL AND PARASPINAL SOFT TISSUES: At least 3.9 cm partially imaged RIGHT renal cyst. Dependent atelectasis. DISC LEVELS: No significant disc bulge, canal stenosis or neural foraminal narrowing at any level. Mild T10-11 facet arthropathy and ligamentum flavum redundancy. IMPRESSION: MRI CERVICAL SPINE:  Normal MRI of the cervical spinal cord. Degenerative change results in mild canal stenosis at C5-6. Neural foraminal narrowing C3-4 thru C5-6: Moderate to severe on the LEFT at C3-4 and C5-6. MRI THORACIC SPINE:  Normal MRI of the thoracic spinal cord. Early degenerative change of the thoracic spine without neurocompression. Electronically Signed   By: Elon Alas M.D.   On: 04/07/2016 21:20   Dg Fluoro Guide Lumbar Puncture  Result Date: 04/07/2016 CLINICAL DATA:  Guillain-Barre syndrome. EXAM: DIAGNOSTIC LUMBAR PUNCTURE UNDER FLUOROSCOPIC GUIDANCE FLUOROSCOPY TIME:  Fluoroscopy Time:  0 minutes, 12 seconds Radiation Exposure Index (if provided by the fluoroscopic device): 1.8 mGy Number of Acquired Spot Images: 0 PROCEDURE:  I discussed the risks (including hemorrhage, infection,  headache, and nerve damage, among others), benefits, and alternatives to fluoroscopically guided lumbar puncture with the patient. We specifically discussed the high technical likelihood of success of the procedure. The patient understood and elected to undergo the procedure. Standard time-out was employed. Following sterile skin prep and local anesthetic administration consisting of 1 percent lidocaine, a 22 gauge spinal needle was advanced without difficulty into the thecal sac at the at the L2-3 level. Clear CSF was returned. Opening pressure was 10 cm water. 12 cc of clear CSF was collected. The needle was subsequently removed and the skin cleansed and bandaged. No immediate complications were observed. IMPRESSION: 1. Successful lumbar puncture at the L2- 3 level with yielding 12 cc of clear CSF. Opening pressure was 10 cm of water. Electronically Signed   By: Van Clines M.D.   On: 04/07/2016 14:29     Scheduled Meds: . diltiazem  120 mg Oral Daily  . metoprolol tartrate  50 mg Oral BID  . rosuvastatin  40 mg Oral q1800  . sodium chloride flush  3 mL Intravenous Q12H  . tamsulosin  0.4 mg Oral QPC supper  . vitamin B-12  1,000 mcg Oral Once per day on Wed Sat  . warfarin  5 mg Oral ONCE-1800  . Warfarin - Pharmacist Dosing Inpatient   Does not apply q1800   Continuous Infusions: PRN Meds: acetaminophen, ondansetron **OR** ondansetron (ZOFRAN) IV  Time spent: 30 minutes  Author: Domenic Polite, MD [facility] Pager: [phone] 04/08/2016 12:43 PM  If 7PM-7AM, please contact night-coverage at [email or web address], password [facility]

## 2016-04-09 DIAGNOSIS — R29898 Other symptoms and signs involving the musculoskeletal system: Secondary | ICD-10-CM | POA: Diagnosis not present

## 2016-04-09 DIAGNOSIS — G610 Guillain-Barre syndrome: Secondary | ICD-10-CM | POA: Diagnosis not present

## 2016-04-09 LAB — PROTIME-INR
INR: 1.21
PROTHROMBIN TIME: 15.4 s — AB (ref 11.4–15.2)

## 2016-04-09 LAB — GLUCOSE, CAPILLARY: GLUCOSE-CAPILLARY: 125 mg/dL — AB (ref 65–99)

## 2016-04-09 LAB — CBC
HCT: 39.4 % (ref 39.0–52.0)
HEMOGLOBIN: 12.6 g/dL — AB (ref 13.0–17.0)
MCH: 28.3 pg (ref 26.0–34.0)
MCHC: 32 g/dL (ref 30.0–36.0)
MCV: 88.3 fL (ref 78.0–100.0)
PLATELETS: 234 10*3/uL (ref 150–400)
RBC: 4.46 MIL/uL (ref 4.22–5.81)
RDW: 13.4 % (ref 11.5–15.5)
WBC: 13 10*3/uL — ABNORMAL HIGH (ref 4.0–10.5)

## 2016-04-09 LAB — BASIC METABOLIC PANEL
Anion gap: 8 (ref 5–15)
BUN: 16 mg/dL (ref 6–20)
CALCIUM: 8.8 mg/dL — AB (ref 8.9–10.3)
CO2: 24 mmol/L (ref 22–32)
CREATININE: 1.49 mg/dL — AB (ref 0.61–1.24)
Chloride: 105 mmol/L (ref 101–111)
GFR, EST AFRICAN AMERICAN: 51 mL/min — AB (ref >60)
GFR, EST NON AFRICAN AMERICAN: 44 mL/min — AB (ref >60)
Glucose, Bld: 120 mg/dL — ABNORMAL HIGH (ref 65–99)
Potassium: 4.1 mmol/L (ref 3.5–5.1)
SODIUM: 137 mmol/L (ref 135–145)

## 2016-04-09 MED ORDER — DIPHENHYDRAMINE HCL 25 MG PO CAPS
25.0000 mg | ORAL_CAPSULE | Freq: Every day | ORAL | Status: DC | PRN
  Administered 2016-04-09 – 2016-04-12 (×4): 25 mg via ORAL
  Filled 2016-04-09 (×4): qty 1

## 2016-04-09 MED ORDER — FUROSEMIDE 40 MG PO TABS
40.0000 mg | ORAL_TABLET | Freq: Once | ORAL | Status: AC
Start: 2016-04-09 — End: 2016-04-09
  Administered 2016-04-09: 40 mg via ORAL
  Filled 2016-04-09: qty 1

## 2016-04-09 MED ORDER — WARFARIN SODIUM 7.5 MG PO TABS
7.5000 mg | ORAL_TABLET | Freq: Once | ORAL | Status: AC
  Administered 2016-04-09: 7.5 mg via ORAL
  Filled 2016-04-09: qty 1

## 2016-04-09 MED ORDER — ENOXAPARIN SODIUM 40 MG/0.4ML ~~LOC~~ SOLN
40.0000 mg | SUBCUTANEOUS | Status: AC
  Administered 2016-04-09 – 2016-04-10 (×2): 40 mg via SUBCUTANEOUS
  Filled 2016-04-09 (×2): qty 0.4

## 2016-04-09 NOTE — Progress Notes (Signed)
[facility] Progress Note  Patient: [name] MVE:[id number]   PCP: Irven Shelling, MD DOB: Nov 09, 1940   DOA: 04/04/2016   DOS: 04/09/2016   Date of Service: the patient was seen and examined on 04/09/2016  Brief hospital course: Pt. with PMH of CKD, CVA, history of GBS, A. fib on chronic anti-coagulation, meningioma, HTN, HLD; admitted on 04/04/2016, with complaint of recurrent fall, was found to have generalized weakness versus suspected GBS. NEuro following, MRIs  Unremarkable, workup ongoing  Assessment and Plan: 1. Bilateral leg weakness/numbness -remote h/o GBS 19 years ago, ? recurrence -MRI brain -chronic occlusion of the right ICA and chronic left hemispheric and to follow malacia underlying craniotomy site, It also shows mildly enlarged meningioma. MRI cervical, thoracic and lumbar spine also unremarkable. -Neurology following -LP 11/22 with Protein of 111 and 0 WBCs-d/w Neurology, concerning for GBS, started IVIG 11/23, day 2/5 now -PT consulting, CIR recommended  2. A. fib. -warfarin resumed post LP -continue metoprolol,Cardizem.  3. Chronic kidney disease. -stable. -add PO lasix due to swelling from IVIG  4. Subacute urinary retention. -remains with foley for last 3weeks since last discharge, Seen at [facility] 2 weeks ago, failed Voiding trial and foley was re-inserted -continue Flomax  DVT Prophylaxis: warfarin, add lovenox as INR low Advance goals of care discussion: Full code Family Communication: wifeat bedside Disposition: may need rehab, ? CIR  Consultants: Neurology Procedures: None  Antibiotics: Anti-infectives    None      Subjective: unchanged still has lower leg numbness and weakness  Objective: Physical Exam: Vitals:   04/08/16 2200 04/09/16 0100 04/09/16 0500 04/09/16 1100  BP: (!) 147/68 (!) 135/51 132/69 137/63  Pulse: 62 60 68 (!) 57  Resp: 18 17 18 20   Temp: 97.8 F (36.6 C) 98 F (36.7 C) 98.2 F (36.8 C) 97.8  F (36.6 C)  TempSrc: Oral Oral Oral Oral  SpO2: 97% 93% 95% 97%  Weight:   98 kg (216 lb)   Height:        Intake/Output Summary (Last 24 hours) at 04/09/16 1249 Last data filed at 04/09/16 0836  Gross per 24 hour  Intake              480 ml  Output             1500 ml  Net            -1020 ml   Filed Weights   04/04/16 1708 04/04/16 2218 04/09/16 0500  Weight: 98.9 kg (218 lb) 97.5 kg (215 lb) 98 kg (216 lb)    General: Alert, Awake and Oriented to Time, Place and Person. Appear in mild distress, affect appropriate Eyes: PERRL, Conjunctiva normal ENT: Oral Mucosa clear moist. Neck: difficult to assess JVD, no Abnormal Mass Or lumps Cardiovascular: S1 and S2 Present, no Murmur, Respiratory: Bilateral Air entry equal and Decreased, no use of accessory muscle, Clear to Auscultation, no Crackles, no wheezes Abdomen: Bowel Sound present, Soft and no tenderness Skin: no redness, no Rash, no induration Extremities: no Pedal edema, no calf tenderness Neurologic: 4/5  bilateral leg weakness, decreased light tough knee down, L knee pain limits mobility too  Data Reviewed: CBC:  Recent Labs Lab 04/04/16 1848 04/05/16 0543 04/07/16 0914 04/09/16 0244  WBC 14.7* 14.7* 15.0* 13.0*  NEUTROABS 11.2*  --  11.8*  --   HGB 14.1 12.9* 12.1* 12.6*  HCT 44.4 41.1 38.9* 39.4  MCV 89.5 90.1 89.6 88.3  PLT 248 238 226 234  Basic Metabolic Panel:  Recent Labs Lab 04/04/16 1848 04/05/16 0543 04/07/16 0914 04/09/16 0017  NA 141 142 138 137  K 4.2 4.2 3.9 4.1  CL 106 109 107 105  CO2 25 27 24 24   GLUCOSE 117* 104* 166* 120*  BUN 14 12 10 16   CREATININE 1.57* 1.48* 1.47* 1.49*  CALCIUM 9.1 8.7* 8.7* 8.8*  MG 2.3  --  1.9  --     Liver Function Tests:  Recent Labs Lab 04/07/16 0914  AST 19  ALT 13*  ALKPHOS 76  BILITOT 0.6  PROT 5.7*  ALBUMIN 2.6*   No results for input(s): LIPASE, AMYLASE in the last 168 hours. No results for input(s): AMMONIA in the last 168  hours. Coagulation Profile:  Recent Labs Lab 04/05/16 0543 04/06/16 0303 04/07/16 0914 04/08/16 0316 04/09/16 0244  INR 2.23 1.86 1.34 1.29 1.21   Cardiac Enzymes: No results for input(s): CKTOTAL, CKMB, CKMBINDEX, TROPONINI in the last 168 hours. BNP (last 3 results) No results for input(s): PROBNP in the last 8760 hours.  CBG:  Recent Labs Lab 04/09/16 0630  GLUCAP 125*    Studies: No results found.   Scheduled Meds: . diltiazem  120 mg Oral Daily  . enoxaparin (LOVENOX) injection  40 mg Subcutaneous Q24H  . Immune Globulin 10%  400 mg/kg Intravenous Q24 Hr x 5  . metoprolol tartrate  50 mg Oral BID  . rosuvastatin  40 mg Oral q1800  . sodium chloride flush  3 mL Intravenous Q12H  . tamsulosin  0.4 mg Oral QPC supper  . vitamin B-12  1,000 mcg Oral Once per day on Wed Sat  . warfarin  7.5 mg Oral ONCE-1800  . Warfarin - Pharmacist Dosing Inpatient   Does not apply q1800   Continuous Infusions: PRN Meds: acetaminophen, ondansetron **OR** ondansetron (ZOFRAN) IV  Time spent: 30 minutes  Author: Domenic Polite, MD [facility] Pager: [phone] 04/09/2016 12:49 PM  If 7PM-7AM, please contact night-coverage at [email or web address], password [facility]

## 2016-04-09 NOTE — Procedures (Signed)
Pt had good effort on NIF & VC at this time. NIF -34 VC 1L

## 2016-04-09 NOTE — Progress Notes (Addendum)
[facility] for warfarin Indication: atrial fibrillation and CVA  Allergies  Allergen Reactions  . Asa [Aspirin] Other (See Comments)    Severe skin bruising   . Immune Globulins Other (See Comments)    Tetanus Immunes Globulins    (  Pt. Cannot take the Flu shot ) [name]     Patient Measurements: Height: 5\' 11"  (180.3 cm) Weight: 216 lb (98 kg) IBW/kg (Calculated) : 75.3   Vital Signs: Temp: 98.2 F (36.8 C) (11/24 0500) Temp Source: Oral (11/24 0500) BP: 132/69 (11/24 0500) Pulse Rate: 68 (11/24 0500)  Labs:  Recent Labs  04/07/16 0914 04/08/16 0316 04/09/16 0017 04/09/16 0244  HGB 12.1*  --   --  12.6*  HCT 38.9*  --   --  39.4  PLT 226  --   --  234  LABPROT 16.7* 16.2*  --  15.4*  INR 1.34 1.29  --  1.21  CREATININE 1.47*  --  1.49*  --     Estimated Creatinine Clearance: 51.1 mL/min (by C-G formula based on SCr of 1.49 mg/dL (H)).  Assessment: 75 yo F on warfarin PTA for CVA and atrial fibrillation presents to ED with leg weakness with concern for Guillian-Barr syndrome (patient has history). Warfarin initially held due to need for LP. LP performed on 11/22 at 1426. Spoke with Dr. Broadus John regarding warfarin and states okay to resume 11/23.  INR subtherapeutic at 1.2. CBC stable. No overt s/s bleeding noted.   [PTA dose warfarin 5 mg daily]  Per neurology LP suggests bout of GBS, IVIG ordered.  Is there any need to hold crestor given possibility of myalgias and weakness?  Goal of Therapy:  INR 2-3 Monitor platelets by anticoagulation protocol: Yes   Plan:  Warfarin 7.5mg  PO x1 tonight  INR daily Monitor for s/s bleeding   Erin Hearing PharmD., BCPS Clinical Pharmacist Pager [phone] 04/09/2016 9:39 AM

## 2016-04-09 NOTE — Progress Notes (Signed)
Physical Therapy Treatment Patient Details Name: [name] MRN: [id number] DOB: 11-18-1940 Today's Date: 04/09/2016    History of Present Illness 75 y.o.malewho presents today with 2-3 day history of new onset bilateral lower extremity numbness and weakness. His PMHx includes prior CVA, left CEA, craniotomy for meningioma resection, GBS in 1999, HTN, HLD, CKD Stage 3 and recently diagnosed paroxysmal atrial fibrillation. He takes Coumadin at home.. Patient has had multiple falls at home and fell 11/20 pm while hospitalized.    PT Comments    Pt progressed to OOB activity within the stedy fram for safety.  Pt able to transfer to [facility] where he had a BM.  Pt motivated but fatigues quickly.  Continue to recommend CIR before return home.    Follow Up Recommendations  CIR     Equipment Recommendations  Other (comment) (TBD at next venue)    Recommendations for Other Services       Precautions / Restrictions Precautions Precautions: Fall Precaution Comments: pt denies h/o falls in past 1 year Restrictions Weight Bearing Restrictions: No    Mobility  Bed Mobility Overal bed mobility: Needs Assistance Bed Mobility: Supine to Sit     Supine to sit: Min assist     General bed mobility comments: Pt required cues for hand placement and use of pad to advance LEs to edge of bed.     Transfers Overall transfer level: Needs assistance   Transfers: Sit to/from Stand Sit to Stand: Mod assist;+2 physical assistance         General transfer comment: Pt able to pull up on stedy bars while PTA and SPTA lifted patient into standing.  Pt performed transfer from bed and BSC.  Pt performed 10 partial sit to stand for strength from stedy frame plates.    Ambulation/Gait Ambulation/Gait assistance:  (unable.  )               Stairs            Wheelchair Mobility    Modified Rankin (Stroke Patients Only)       Balance Overall balance assessment: Needs  assistance   Sitting balance-Leahy Scale: Good       Standing balance-Leahy Scale: Fair                      Cognition Arousal/Alertness: Awake/alert Behavior During Therapy: WFL for tasks assessed/performed Overall Cognitive Status: Within Functional Limits for tasks assessed                      Exercises      General Comments        Pertinent Vitals/Pain Pain Assessment: No/denies pain    Home Living                      Prior Function            PT Goals (current goals can now be found in the care plan section) Acute Rehab PT Goals Patient Stated Goal: to get out of the bed.   Potential to Achieve Goals: Fair Progress towards PT goals: Progressing toward goals    Frequency    Min 3X/week      PT Plan Current plan remains appropriate    Co-evaluation             End of Session   Activity Tolerance: Patient tolerated treatment well Patient left: with call bell/phone within reach;with family/visitor present;in chair;with chair  alarm set     Time: 1543-1610 PT Time Calculation (min) (ACUTE ONLY): 27 min  Charges:  $Therapeutic Activity: 23-37 mins                    G Codes:      Cristela Blue 04/10/2016, 4:19 PM  Governor Rooks, PTA pager [phone]

## 2016-04-09 NOTE — Progress Notes (Signed)
Subjective: Interval History: none..  Objective: Vital signs in last 24 hours: Temp:  [97.5 F (36.4 C)-98.2 F (36.8 C)] 97.5 F (36.4 C) (11/24 1744) Pulse Rate:  [54-68] 54 (11/24 1744) Resp:  [17-20] 20 (11/24 1744) BP: (132-147)/(51-69) 142/54 (11/24 1744) SpO2:  [93 %-97 %] 95 % (11/24 1744) Weight:  [98 kg (216 lb)] 98 kg (216 lb) (11/24 0500)  Intake/Output from previous day: 11/23 0701 - 11/24 0700 In: 240 [P.O.:240] Out: 2300 [Urine:2300] Intake/Output this shift: Total I/O In: 720 [P.O.:720] Out: -  Nutritional status: Diet Heart Room service appropriate? Yes; Fluid consistency: Thin  Neuro Exam  Lab Results:  Recent Labs  04/07/16 0914 04/09/16 0017 04/09/16 0244  WBC 15.0*  --  13.0*  HGB 12.1*  --  12.6*  HCT 38.9*  --  39.4  PLT 226  --  234  NA 138 137  --   K 3.9 4.1  --   CL 107 105  --   CO2 24 24  --   GLUCOSE 166* 120*  --   BUN 10 16  --   CREATININE 1.47* 1.49*  --   CALCIUM 8.7* 8.8*  --    Lipid Panel No results for input(s): CHOL, TRIG, HDL, CHOLHDL, VLDL, LDLCALC in the last 72 hours.  Studies/Results: Mr Cervical Spine W Wo Contrast  Result Date: 04/07/2016 CLINICAL DATA:  Lower limb weakness. New versus recrudescent weakness from Guillain-Barre syndrome 1999. History of stroke, carotid artery occlusion, hypertension, hyperlipidemia and intracranial meningioma. EXAM: MRI CERVICAL AND THORACIC SPINE WITHOUT AND WITH CONTRAST TECHNIQUE: Multiplanar and multiecho pulse sequences of the cervical spine, to include the craniocervical junction and cervicothoracic junction, and thoracic spine, were obtained without and with intravenous contrast. CONTRAST:  54mL MULTIHANCE GADOBENATE DIMEGLUMINE 529 MG/ML IV SOLN COMPARISON:  MRI head and lumbar spine April 05, 2016 FINDINGS: MRI CERVICAL SPINE FINDINGS ALIGNMENT: Straightened cervical lordosis.  No malalignment. VERTEBRAE/DISCS: Vertebral bodies are intact. Intervertebral disc morphology's  are normal, mild desiccation of all cervical discs. Mild ventral endplate spurring Y8-1 through C6-7. No abnormal bone marrow signal. No abnormal osseous or intradiscal enhancement. CORD:Cervical spinal cord is normal morphology and signal characteristics from the cervicomedullary junction to level of T1-2, the most caudal well visualized level. No abnormal cord, leptomeningeal or epidural enhancement. POSTERIOR FOSSA, VERTEBRAL ARTERIES, PARASPINAL TISSUES: No MR findings of ligamentous injury. Vertebral artery flow voids present. Included posterior fossa and paraspinal soft tissues are normal. DISC LEVELS: C2-3: No disc bulge, canal stenosis nor neural foraminal narrowing. Mild facet arthropathy. C3-4: Annular bulging. Small LEFT subarticular disc protrusion. Uncovertebral hypertrophy and mild facet arthropathy. No canal stenosis. Moderate to severe LEFT neural foraminal narrowing. C4-5: Annular bulging, uncovertebral hypertrophy and LEFT greater than RIGHT moderate facet arthropathy. No canal stenosis. Moderate bilateral neural foraminal narrowing. C5-6: Annular bulging eccentric laterally, small LEFT subarticular disc protrusion. Uncovertebral hypertrophy and mild facet arthropathy. Mild canal stenosis. Moderate RIGHT, moderate to severe LEFT neural foraminal narrowing. C6-7: Uncovertebral hypertrophy and mild facet arthropathy. No canal stenosis or neural foraminal narrowing. C7-T1: No disc bulge, canal stenosis nor neural foraminal narrowing. MRI THORACIC SPINE FINDINGS- Mild motion degraded examination. ALIGNMENT: Maintenance of the thoracic kyphosis. No malalignment. VERTEBRAE/DISCS: Vertebral bodies are intact. Intervertebral discs morphology are normal, mild disc desiccation. Multilevel lower mild subacute discogenic endplate changes without abnormal bone marrow signal. No abnormal osseous or intradiscal enhancement. Scattered old Schmorl's nodes. CORD: Thoracic spinal cord is normal morphology and signal  characteristics to the level of the  conus medullaris which terminates at T12-L1. PREVERTEBRAL AND PARASPINAL SOFT TISSUES: At least 3.9 cm partially imaged RIGHT renal cyst. Dependent atelectasis. DISC LEVELS: No significant disc bulge, canal stenosis or neural foraminal narrowing at any level. Mild T10-11 facet arthropathy and ligamentum flavum redundancy. IMPRESSION: MRI CERVICAL SPINE:  Normal MRI of the cervical spinal cord. Degenerative change results in mild canal stenosis at C5-6. Neural foraminal narrowing C3-4 thru C5-6: Moderate to severe on the LEFT at C3-4 and C5-6. MRI THORACIC SPINE:  Normal MRI of the thoracic spinal cord. Early degenerative change of the thoracic spine without neurocompression. Electronically Signed   By: Elon Alas M.D.   On: 04/07/2016 21:20   Mr Thoracic Spine W Wo Contrast  Result Date: 04/07/2016 CLINICAL DATA:  Lower limb weakness. New versus recrudescent weakness from Guillain-Barre syndrome 1999. History of stroke, carotid artery occlusion, hypertension, hyperlipidemia and intracranial meningioma. EXAM: MRI CERVICAL AND THORACIC SPINE WITHOUT AND WITH CONTRAST TECHNIQUE: Multiplanar and multiecho pulse sequences of the cervical spine, to include the craniocervical junction and cervicothoracic junction, and thoracic spine, were obtained without and with intravenous contrast. CONTRAST:  18mL MULTIHANCE GADOBENATE DIMEGLUMINE 529 MG/ML IV SOLN COMPARISON:  MRI head and lumbar spine April 05, 2016 FINDINGS: MRI CERVICAL SPINE FINDINGS ALIGNMENT: Straightened cervical lordosis.  No malalignment. VERTEBRAE/DISCS: Vertebral bodies are intact. Intervertebral disc morphology's are normal, mild desiccation of all cervical discs. Mild ventral endplate spurring Q6-5 through C6-7. No abnormal bone marrow signal. No abnormal osseous or intradiscal enhancement. CORD:Cervical spinal cord is normal morphology and signal characteristics from the cervicomedullary junction to  level of T1-2, the most caudal well visualized level. No abnormal cord, leptomeningeal or epidural enhancement. POSTERIOR FOSSA, VERTEBRAL ARTERIES, PARASPINAL TISSUES: No MR findings of ligamentous injury. Vertebral artery flow voids present. Included posterior fossa and paraspinal soft tissues are normal. DISC LEVELS: C2-3: No disc bulge, canal stenosis nor neural foraminal narrowing. Mild facet arthropathy. C3-4: Annular bulging. Small LEFT subarticular disc protrusion. Uncovertebral hypertrophy and mild facet arthropathy. No canal stenosis. Moderate to severe LEFT neural foraminal narrowing. C4-5: Annular bulging, uncovertebral hypertrophy and LEFT greater than RIGHT moderate facet arthropathy. No canal stenosis. Moderate bilateral neural foraminal narrowing. C5-6: Annular bulging eccentric laterally, small LEFT subarticular disc protrusion. Uncovertebral hypertrophy and mild facet arthropathy. Mild canal stenosis. Moderate RIGHT, moderate to severe LEFT neural foraminal narrowing. C6-7: Uncovertebral hypertrophy and mild facet arthropathy. No canal stenosis or neural foraminal narrowing. C7-T1: No disc bulge, canal stenosis nor neural foraminal narrowing. MRI THORACIC SPINE FINDINGS- Mild motion degraded examination. ALIGNMENT: Maintenance of the thoracic kyphosis. No malalignment. VERTEBRAE/DISCS: Vertebral bodies are intact. Intervertebral discs morphology are normal, mild disc desiccation. Multilevel lower mild subacute discogenic endplate changes without abnormal bone marrow signal. No abnormal osseous or intradiscal enhancement. Scattered old Schmorl's nodes. CORD: Thoracic spinal cord is normal morphology and signal characteristics to the level of the conus medullaris which terminates at T12-L1. PREVERTEBRAL AND PARASPINAL SOFT TISSUES: At least 3.9 cm partially imaged RIGHT renal cyst. Dependent atelectasis. DISC LEVELS: No significant disc bulge, canal stenosis or neural foraminal narrowing at any  level. Mild T10-11 facet arthropathy and ligamentum flavum redundancy. IMPRESSION: MRI CERVICAL SPINE:  Normal MRI of the cervical spinal cord. Degenerative change results in mild canal stenosis at C5-6. Neural foraminal narrowing C3-4 thru C5-6: Moderate to severe on the LEFT at C3-4 and C5-6. MRI THORACIC SPINE:  Normal MRI of the thoracic spinal cord. Early degenerative change of the thoracic spine without neurocompression. Electronically Signed   By:  Elon Alas M.D.   On: 04/07/2016 21:20    Medications: I have reviewed the patient's current medications.  Assessment/Plan: 1. Probable GBS Proceeding with IVIG tx.  Today was D2/5.  He is tolerating it without any issues. Will continue to monitor weight and I/O's to ensure no significant fluid retention.  2. Mult medical probs    LOS: 5 days   Doren Custard, MD Neurology

## 2016-04-09 NOTE — Progress Notes (Signed)
Inpatient Rehabilitation  Continuing to follow for readiness for IP Rehab consult order.  Note that work up positive for GBS now being treated with IVIG .  Last PT note dated 04/06/16; we will continue to follow for ability to participate in therapy as well as therapy recommendations prior to requesting IP Rehab consult.  Please call with questions.  Carmelia Roller., CCC/SLP Admission Coordinator  [place]  Cell [phone]

## 2016-04-09 NOTE — Progress Notes (Signed)
NIF: -36 VC: 1.5L   Good effort.

## 2016-04-10 DIAGNOSIS — R29898 Other symptoms and signs involving the musculoskeletal system: Secondary | ICD-10-CM | POA: Diagnosis not present

## 2016-04-10 DIAGNOSIS — G610 Guillain-Barre syndrome: Secondary | ICD-10-CM | POA: Diagnosis not present

## 2016-04-10 LAB — CBC
HEMATOCRIT: 39.2 % (ref 39.0–52.0)
Hemoglobin: 12.5 g/dL — ABNORMAL LOW (ref 13.0–17.0)
MCH: 28.2 pg (ref 26.0–34.0)
MCHC: 31.9 g/dL (ref 30.0–36.0)
MCV: 88.3 fL (ref 78.0–100.0)
Platelets: 240 10*3/uL (ref 150–400)
RBC: 4.44 MIL/uL (ref 4.22–5.81)
RDW: 13.5 % (ref 11.5–15.5)
WBC: 12.5 10*3/uL — ABNORMAL HIGH (ref 4.0–10.5)

## 2016-04-10 LAB — BASIC METABOLIC PANEL
Anion gap: 9 (ref 5–15)
BUN: 22 mg/dL — AB (ref 6–20)
CALCIUM: 9 mg/dL (ref 8.9–10.3)
CO2: 26 mmol/L (ref 22–32)
CREATININE: 1.53 mg/dL — AB (ref 0.61–1.24)
Chloride: 102 mmol/L (ref 101–111)
GFR calc non Af Amer: 43 mL/min — ABNORMAL LOW (ref >60)
GFR, EST AFRICAN AMERICAN: 50 mL/min — AB (ref >60)
GLUCOSE: 118 mg/dL — AB (ref 65–99)
Potassium: 3.9 mmol/L (ref 3.5–5.1)
Sodium: 137 mmol/L (ref 135–145)

## 2016-04-10 LAB — PROTIME-INR
INR: 1.23
Prothrombin Time: 15.6 seconds — ABNORMAL HIGH (ref 11.4–15.2)

## 2016-04-10 MED ORDER — WARFARIN SODIUM 7.5 MG PO TABS
7.5000 mg | ORAL_TABLET | Freq: Once | ORAL | Status: AC
  Administered 2016-04-10: 7.5 mg via ORAL
  Filled 2016-04-10: qty 1

## 2016-04-10 NOTE — Plan of Care (Signed)
Problem: Safety: Goal: Ability to remain free from injury will improve Outcome: Progressing Up to chair via Vertis Kelch to chair, toler well.

## 2016-04-10 NOTE — Progress Notes (Addendum)
[place] for warfarin Indication: atrial fibrillation and CVA  Allergies  Allergen Reactions  . Asa [Aspirin] Other (See Comments)    Severe skin bruising   . Immune Globulins Other (See Comments)    Tetanus Immunes Globulins    (  Pt. Cannot take the Flu shot ) [name]     Patient Measurements: Height: 5\' 11"  (180.3 cm) Weight: 220 lb 4.8 oz (99.9 kg) IBW/kg (Calculated) : 75.3   Vital Signs: Temp: 98.1 F (36.7 C) (11/25 0546) Temp Source: Oral (11/25 0546) BP: 128/55 (11/25 0546) Pulse Rate: 53 (11/25 0546)  Labs:  Recent Labs  04/07/16 0914 04/08/16 0316 04/09/16 0017 04/09/16 0244 04/10/16 0250  HGB 12.1*  --   --  12.6* 12.5*  HCT 38.9*  --   --  39.4 39.2  PLT 226  --   --  234 240  LABPROT 16.7* 16.2*  --  15.4* 15.6*  INR 1.34 1.29  --  1.21 1.23  CREATININE 1.47*  --  1.49*  --  1.53*    Estimated Creatinine Clearance: 50.2 mL/min (by C-G formula based on SCr of 1.53 mg/dL (H)).  Assessment: 75 yo M on warfarin PTA for CVA and atrial fibrillation presents to ED with leg weakness with concern for Guillian-Barr syndrome (patient has history). Warfarin initially held due to need for LP. LP performed on 11/22 at 1426. Warfarin resumed 11/23.   INR subtherapeutic at 1.23. CBC stable. No overt s/s bleeding noted. Patient also on Lovenox bridge due to low INR.   [PTA dose warfarin 5 mg daily]  Now on Day 3/5 of IVIG treatment for GBS.   Is there any need to hold crestor given possibility of myalgias and weakness?  Goal of Therapy:  INR 2-3 Monitor platelets by anticoagulation protocol: Yes   Plan:  Repeat warfarin 7.5mg  PO x1 tonight  INR daily Monitor for s/s bleeding  Enoxaparin while INR low.   Uvaldo Bristle, PharmD PGY1 Pharmacy Resident  Pager [phone] 04/10/2016 8:52 AM

## 2016-04-10 NOTE — Progress Notes (Signed)
Tolerating IV Privigen infusion well. Up to target rate of 140ml/hr, no reaction noted, pt feels well.

## 2016-04-10 NOTE — Progress Notes (Signed)
Subjective: Interval History: PT doing well on IVIG, no shortness of breath, feels that weakness may be improved.   Objective: Vital signs in last 24 hours: Temp:  [97.6 F (36.4 C)-98.2 F (36.8 C)] 98 F (36.7 C) (11/25 1900) Pulse Rate:  [51-63] 59 (11/25 1900) Resp:  [16-20] 16 (11/25 1900) BP: (114-151)/(47-62) 151/52 (11/25 1900) SpO2:  [94 %-99 %] 94 % (11/25 1900) Weight:  [99.9 kg (220 lb 4.8 oz)] 99.9 kg (220 lb 4.8 oz) (11/25 0546)  Intake/Output from previous day: 11/24 0701 - 11/25 0700 In: 2415.2 [P.O.:720; I.V.:1695.2] Out: 3600 [Urine:3600] Intake/Output this shift: No intake/output data recorded. Nutritional status: Diet Heart Room service appropriate? Yes; Fluid consistency: Thin  Neuro Exam  MS - normal CN - II-XII in tact.  Motor - UE 5/5 throughout RLE: HF 4/5, KF/KE 5/5, DF / PF 4/5 LLE: HF 4/5, KF 5/5, KE 4/5, PF 4/5, DF 3/5 DTRs - remain absent in lowers Sensory - in tact throughout   Lab Results:  Recent Labs  04/09/16 0017 04/09/16 0244 04/10/16 0250  WBC  --  13.0* 12.5*  HGB  --  12.6* 12.5*  HCT  --  39.4 39.2  PLT  --  234 240  NA 137  --  137  K 4.1  --  3.9  CL 105  --  102  CO2 24  --  26  GLUCOSE 120*  --  118*  BUN 16  --  22*  CREATININE 1.49*  --  1.53*  CALCIUM 8.8*  --  9.0   Lipid Panel No results for input(s): CHOL, TRIG, HDL, CHOLHDL, VLDL, LDLCALC in the last 72 hours.  Studies/Results: No results found.  Medications: I have reviewed the patient's current medications.  Assessment/Plan: PTs symptoms may slightly be improved but no drastic improvement yet. Today is Day #3/5 of IVIG.   1. Probable GBS Proceeding with IVIG tx.  Today was D3/5.  He is tolerating it without any issues. Will continue to monitor weight and I/O's to ensure no significant fluid retention.   LOS: 6 days   Loney Hering, D.O.  [facility] [phone]

## 2016-04-10 NOTE — Progress Notes (Signed)
[facility] Progress Note  Patient: [name] JOI:[id number]   PCP: Irven Shelling, MD DOB: September 11, 1940   DOA: 04/04/2016   DOS: 04/10/2016   Date of Service: the patient was seen and examined on 04/10/2016  Brief hospital course: Pt. with PMH of CKD, CVA, history of GBS, A. fib on chronic anti-coagulation, meningioma, HTN, HLD; admitted on 04/04/2016, with complaint of recurrent fall, was found to have generalized weakness versus suspected GBS. NEuro following, MRIs  Unremarkable, workup ongoing  Assessment and Plan: 1. Bilateral leg weakness/numbness -remote h/o GBS 19 years ago, ? recurrence -MRI brain -chronic occlusion of the right ICA and chronic left hemispheric and to follow malacia underlying craniotomy site, It also shows mildly enlarged meningioma. MRI cervical, thoracic and lumbar spine also unremarkable. -Neurology following -LP 11/22 with Protein of 111 and 0 WBCs-d/w Neurology, concerning for GBS, started IVIG 11/23, day 3/5 now -Spinal fluid with rare Coag negative staph in spinal fluid Cx, with no s/s of meningitis , 0 WBC in spinal fluid, do not think he has meningitis, d/w ID Dr.Van [place] who agrees that this could be a contaminant and recommends to monitor for now -PT consulting, CIR recommended -Hopefully rehab Monday/tuesday  2. A. fib. -warfarin resumed post LP -continue metoprolol,Cardizem.  3. Chronic kidney disease. -stable. -add PO lasix due to swelling from IVIG  4. Subacute urinary retention. -remains with foley for last 3weeks since last discharge, Seen at [facility] 2 weeks ago, failed Voiding trial and foley was re-inserted -continue Flomax  DVT Prophylaxis: warfarin, add lovenox as INR low Advance goals of care discussion: Full code Family Communication: wifeat bedside Disposition: needs rehab, ? CIR  Consultants: Neurology Procedures: None  Antibiotics: Anti-infectives    None      Subjective: improving lower leg numbness  and weakness  Objective: Physical Exam: Vitals:   04/09/16 2034 04/10/16 0100 04/10/16 0546 04/10/16 1006  BP: (!) 132/47 (!) 114/53 (!) 128/55 (!) 139/54  Pulse: 63 (!) 51 (!) 53 (!) 59  Resp: 18 18 18 18   Temp: 97.7 F (36.5 C) 97.6 F (36.4 C) 98.1 F (36.7 C) 97.6 F (36.4 C)  TempSrc: Oral Oral Oral Oral  SpO2: 95% 94% 95% 99%  Weight:   99.9 kg (220 lb 4.8 oz)   Height:        Intake/Output Summary (Last 24 hours) at 04/10/16 1255 Last data filed at 04/10/16 0547  Gross per 24 hour  Intake           2175.2 ml  Output             3600 ml  Net          -1424.8 ml   Filed Weights   04/04/16 2218 04/09/16 0500 04/10/16 0546  Weight: 97.5 kg (215 lb) 98 kg (216 lb) 99.9 kg (220 lb 4.8 oz)    General: Alert, Awake and Oriented to Time, Place and Person. Appear in mild distress, affect appropriate Eyes: PERRL, Conjunctiva normal ENT: Oral Mucosa clear moist. Neck: difficult to assess JVD, no Abnormal Mass Or lumps Cardiovascular: S1 and S2 Present, no Murmur, Respiratory: Bilateral Air entry equal and Decreased, no use of accessory muscle, Clear to Auscultation, no Crackles, no wheezes Abdomen: Bowel Sound present, Soft and no tenderness Skin: no redness, no Rash, no induration Extremities: no Pedal edema, no calf tenderness Neurologic: 4/5  bilateral leg weakness, decreased light tough knee down, L knee pain limits mobility too-improving now  Data Reviewed: CBC:  Recent  Labs Lab 04/04/16 1848 04/05/16 0543 04/07/16 0914 04/09/16 0244 04/10/16 0250  WBC 14.7* 14.7* 15.0* 13.0* 12.5*  NEUTROABS 11.2*  --  11.8*  --   --   HGB 14.1 12.9* 12.1* 12.6* 12.5*  HCT 44.4 41.1 38.9* 39.4 39.2  MCV 89.5 90.1 89.6 88.3 88.3  PLT 248 238 226 234 465   Basic Metabolic Panel:  Recent Labs Lab 04/04/16 1848 04/05/16 0543 04/07/16 0914 04/09/16 0017 04/10/16 0250  NA 141 142 138 137 137  K 4.2 4.2 3.9 4.1 3.9  CL 106 109 107 105 102  CO2 25 27 24 24 26   GLUCOSE  117* 104* 166* 120* 118*  BUN 14 12 10 16  22*  CREATININE 1.57* 1.48* 1.47* 1.49* 1.53*  CALCIUM 9.1 8.7* 8.7* 8.8* 9.0  MG 2.3  --  1.9  --   --     Liver Function Tests:  Recent Labs Lab 04/07/16 0914  AST 19  ALT 13*  ALKPHOS 76  BILITOT 0.6  PROT 5.7*  ALBUMIN 2.6*   No results for input(s): LIPASE, AMYLASE in the last 168 hours. No results for input(s): AMMONIA in the last 168 hours. Coagulation Profile:  Recent Labs Lab 04/06/16 0303 04/07/16 0914 04/08/16 0316 04/09/16 0244 04/10/16 0250  INR 1.86 1.34 1.29 1.21 1.23   Cardiac Enzymes: No results for input(s): CKTOTAL, CKMB, CKMBINDEX, TROPONINI in the last 168 hours. BNP (last 3 results) No results for input(s): PROBNP in the last 8760 hours.  CBG:  Recent Labs Lab 04/09/16 0630  GLUCAP 125*    Studies: No results found.   Scheduled Meds: . diltiazem  120 mg Oral Daily  . Immune Globulin 10%  400 mg/kg Intravenous Q24 Hr x 5  . metoprolol tartrate  50 mg Oral BID  . rosuvastatin  40 mg Oral q1800  . sodium chloride flush  3 mL Intravenous Q12H  . tamsulosin  0.4 mg Oral QPC supper  . vitamin B-12  1,000 mcg Oral Once per day on Wed Sat  . warfarin  7.5 mg Oral ONCE-1800  . Warfarin - Pharmacist Dosing Inpatient   Does not apply q1800   Continuous Infusions: PRN Meds: acetaminophen, diphenhydrAMINE, ondansetron **OR** ondansetron (ZOFRAN) IV  Time spent: 30 minutes  Author: Domenic Polite, MD [facility] Pager: [phone] 04/10/2016 12:55 PM  If 7PM-7AM, please contact night-coverage at [email or web address], password [email or web address]

## 2016-04-10 NOTE — Progress Notes (Signed)
RT note: NIF/FVC obtained, both with good effort: FVC(1.2)L/NIF(-32)cmh20, RT to monitor.

## 2016-04-10 NOTE — Progress Notes (Signed)
Lab called w/panic value on CSF. Growing coag. Negative staph.

## 2016-04-10 NOTE — Progress Notes (Signed)
-  34CMH20,  NIF and 1.65 L VC.  Good effort

## 2016-04-11 DIAGNOSIS — R29898 Other symptoms and signs involving the musculoskeletal system: Secondary | ICD-10-CM | POA: Diagnosis not present

## 2016-04-11 DIAGNOSIS — G610 Guillain-Barre syndrome: Secondary | ICD-10-CM | POA: Diagnosis not present

## 2016-04-11 LAB — CBC
HEMATOCRIT: 38.1 % — AB (ref 39.0–52.0)
Hemoglobin: 12.3 g/dL — ABNORMAL LOW (ref 13.0–17.0)
MCH: 28.3 pg (ref 26.0–34.0)
MCHC: 32.3 g/dL (ref 30.0–36.0)
MCV: 87.8 fL (ref 78.0–100.0)
Platelets: 231 10*3/uL (ref 150–400)
RBC: 4.34 MIL/uL (ref 4.22–5.81)
RDW: 13.3 % (ref 11.5–15.5)
WBC: 9.1 10*3/uL (ref 4.0–10.5)

## 2016-04-11 LAB — CULTURE, BLOOD (ROUTINE X 2)
CULTURE: NO GROWTH
Culture: NO GROWTH

## 2016-04-11 LAB — PROTIME-INR
INR: 1.37
Prothrombin Time: 17 seconds — ABNORMAL HIGH (ref 11.4–15.2)

## 2016-04-11 MED ORDER — WARFARIN SODIUM 5 MG PO TABS: 10.0000 mg | ORAL_TABLET | Freq: Once | ORAL | Status: DC

## 2016-04-11 MED ORDER — WARFARIN SODIUM 7.5 MG PO TABS
7.5000 mg | ORAL_TABLET | Freq: Once | ORAL | Status: AC
  Administered 2016-04-11: 7.5 mg via ORAL
  Filled 2016-04-11: qty 1

## 2016-04-11 MED ORDER — ENOXAPARIN SODIUM 40 MG/0.4ML ~~LOC~~ SOLN
40.0000 mg | SUBCUTANEOUS | Status: AC
  Administered 2016-04-11 – 2016-04-13 (×3): 40 mg via SUBCUTANEOUS
  Filled 2016-04-11 (×3): qty 0.4

## 2016-04-11 NOTE — Progress Notes (Signed)
ToleratingIVG well.

## 2016-04-11 NOTE — Clinical Social Work Placement (Signed)
   CLINICAL SOCIAL WORK PLACEMENT  NOTE  Date:  04/11/2016  Patient Details  Name: [name] MRN: [id number] Date of Birth: Dec 03, 1940  Clinical Social Work is seeking post-discharge placement for this patient at the [place] level of care (*CSW will initial, date and re-position this form in  chart as items are completed):  Yes   Patient/family provided with [facility] Work Department's list of facilities offering this level of care within the geographic area requested by the patient (or if unable, by the patient's family).  Yes   Patient/family informed of their freedom to choose among providers that offer the needed level of care, that participate in Medicare, Medicaid or managed care program needed by the patient, have an available bed and are willing to accept the patient.  Yes   Patient/family informed of [facility]'s ownership interest in [facility] and [facility], as well as of the fact that they are under no obligation to receive care at these facilities.  PASRR submitted to EDS on 04/11/16     PASRR number received on 04/11/16     Existing PASRR number confirmed on       FL2 transmitted to all facilities in geographic area requested by pt/family on 04/11/16     FL2 transmitted to all facilities within larger geographic area on       Patient informed that his/her managed care company has contracts with or will negotiate with certain facilities, including the following:            Patient/family informed of bed offers received.  Patient chooses bed at       Physician recommends and patient chooses bed at      Patient to be transferred to   on  .  Patient to be transferred to facility by       Patient family notified on   of transfer.  Name of family member notified:        PHYSICIAN Please sign FL2     Additional Comment:   Barbette Or, [place]

## 2016-04-11 NOTE — Progress Notes (Addendum)
[place] for warfarin Indication: atrial fibrillation and CVA  Allergies  Allergen Reactions  . Asa [Aspirin] Other (See Comments)    Severe skin bruising   . Immune Globulins Other (See Comments)    Tetanus Immunes Globulins    (  Pt. Cannot take the Flu shot ) [name]     Patient Measurements: Height: 5\' 11"  (180.3 cm) Weight: 218 lb 4.1 oz (99 kg) IBW/kg (Calculated) : 75.3   Vital Signs: Temp: 98.4 F (36.9 C) (11/26 0936) Temp Source: Oral (11/26 0936) BP: 135/54 (11/26 0936) Pulse Rate: 54 (11/26 0936)  Labs:  Recent Labs  04/09/16 0017  04/09/16 0244 04/10/16 0250 04/11/16 0229  HGB  --   < > 12.6* 12.5* 12.3*  HCT  --   --  39.4 39.2 38.1*  PLT  --   --  234 240 231  LABPROT  --   --  15.4* 15.6* 17.0*  INR  --   --  1.21 1.23 1.37  CREATININE 1.49*  --   --  1.53*  --   < > = values in this interval not displayed.  Estimated Creatinine Clearance: 50 mL/min (by C-G formula based on SCr of 1.53 mg/dL (H)).  Assessment: 75 yo M on warfarin PTA for CVA and atrial fibrillation presents to ED with leg weakness with concern for Guillian-Barr syndrome (patient has history). Warfarin initially held due to need for LP. LP performed on 11/22 at 1426. Warfarin resumed 11/23.   INR subtherapeutic at 1.37. CBC stable. No overt s/s bleeding noted. Patient received 2 doses of Lovenox for bridging.   [PTA dose warfarin 5 mg daily]  Now on Day 4/5 of IVIG treatment for GBS.    Goal of Therapy:  INR 2-3 Monitor platelets by anticoagulation protocol: Yes   Plan:  Warfarin 7.5 mg x 1 tonight  INR daily Monitor for s/s bleeding  Lovenox bridge while INR subtherapeutic    Uvaldo Bristle, PharmD PGY1 Pharmacy Resident  Pager [phone] 04/11/2016 10:02 AM

## 2016-04-11 NOTE — NC FL2 (Signed)
[place] LEVEL OF CARE SCREENING TOOL     IDENTIFICATION  Patient Name: [name] Birthdate: 08/29/1940 Sex: male Admission Date (Current Location): 04/04/2016  [facility] and [place] Number:  Herbalist and Address:  [facility] [place] [address], [place], [place] [address]      Provider Number: [id number]  Attending Physician Name and Address:  Domenic Polite, MD  Relative Name and Phone Number:       Current Level of Care: Hospital Recommended Level of Care: [place] Prior Approval Number:    Date Approved/Denied:   PASRR Number:   [id number]   Discharge Plan: SNF    Current Diagnoses: Patient Active Problem List   Diagnosis Date Noted  . Weakness of both lower extremities   . Leg weakness 04/04/2016  . Guillain Barr syndrome ([place]) 04/04/2016  . Cellulitis of left knee 03/12/2016  . Atrial fibrillation, new onset ([place]) 03/07/2016  . Urinary tract infection without hematuria 03/07/2016  . Renal insufficiency 03/07/2016  . Hypokalemia 03/07/2016  . History of CVA (cerebrovascular accident) 03/07/2016  . HTN (hypertension) 03/07/2016  . Atrial fibrillation ([place]) 03/07/2016  . Acute urinary retention   . Occlusion and stenosis of carotid artery without mention of cerebral infarction 07/11/2012  . Carotid stenosis 07/11/2012    Orientation RESPIRATION BLADDER Height & Weight     Self, Time, Situation  Normal Continent Weight: 218 lb 4.1 oz (99 kg) Height:  5\' 11"  (180.3 cm)  BEHAVIORAL SYMPTOMS/MOOD NEUROLOGICAL BOWEL NUTRITION STATUS      Continent Diet (Heart Healthy)  AMBULATORY STATUS COMMUNICATION OF NEEDS Skin   Limited Assist Verbally Normal                       Personal Care Assistance Level of Assistance  Bathing, Feeding Bathing Assistance: Limited assistance Feeding assistance: Limited assistance Dressing Assistance: Limited assistance     Functional Limitations  Info             SPECIAL CARE FACTORS FREQUENCY  PT (By licensed PT), OT (By licensed OT)     PT Frequency: 5 OT Frequency: 5            Contractures      Additional Factors Info  Code Status, Allergies Code Status Info: Full Code  Allergies Info: [place]a Aspirin, Immune Globulins           Current Medications (04/11/2016):  This is the current hospital active medication list Current Facility-Administered Medications  Medication Dose Route Frequency Provider Last Rate Last Dose  . acetaminophen (TYLENOL) tablet 650 mg  650 mg Oral Q6H PRN Lavina Hamman, MD   650 mg at 04/10/16 1619  . diltiazem (CARDIZEM CD) 24 hr capsule 120 mg  120 mg Oral Daily Geradine Girt, DO   120 mg at 04/11/16 1032  . diphenhydrAMINE (BENADRYL) capsule 25 mg  25 mg Oral Daily PRN Domenic Polite, MD   25 mg at 04/10/16 1619  . enoxaparin (LOVENOX) injection 40 mg  40 mg Subcutaneous Q24H Domenic Polite, MD   40 mg at 04/11/16 1032  . Immune Globulin 10% (PRIVIGEN) IV infusion 40 g  400 mg/kg Intravenous Q24 Hr x 5 Doren Custard, MD 48 mL/hr at 04/10/16 1622 40 g at 04/10/16 1622  . metoprolol (LOPRESSOR) tablet 50 mg  50 mg Oral BID Domenic Polite, MD   50 mg at 04/11/16 1032  . ondansetron (ZOFRAN) tablet 4 mg  4 mg Oral Q6H PRN Geradine Girt, DO       Or  . ondansetron [facility]) injection 4 mg  4 mg Intravenous Q6H PRN Geradine Girt, DO      . rosuvastatin (CRESTOR) tablet 40 mg  40 mg Oral q1800 Geradine Girt, DO   40 mg at 04/10/16 1653  . sodium chloride flush (NS) 0.9 % injection 3 mL  3 mL Intravenous Q12H Jessica U Vann, DO   3 mL at 04/11/16 1029  . tamsulosin (FLOMAX) capsule 0.4 mg  0.4 mg Oral QPC supper Geradine Girt, DO   0.4 mg at 04/10/16 1654  . vitamin B-12 (CYANOCOBALAMIN) tablet 1,000 mcg  1,000 mcg Oral Once per day on Wed Sat Jessica U Vann, DO   1,000 mcg at 04/10/16 3299  . warfarin (COUMADIN) tablet 7.5 mg  7.5 mg Oral ONCE-1800 Ihor Austin, RPH      . Warfarin -  Pharmacist Dosing Inpatient   Does not apply [place]         Discharge Medications: Please see discharge summary for a list of discharge medications.  Relevant Imaging Results:  Relevant Lab Results:   Additional Information SS#: [id number]  Weston Anna, LCSW

## 2016-04-11 NOTE — Clinical Social Work Note (Signed)
Clinical Social Work Assessment  Patient Details  Name: [name] MRN: [id number] Date of Birth: 1940-12-28  Date of referral:  04/11/16               Reason for consult:  Facility Placement                Permission sought to share information with:  Family Supports Permission granted to share information::  Yes, Verbal Permission Granted  Name::     [name]  Relationship::  Spouse  Contact Information:  [phone]  Housing/Transportation Living arrangements for the past 2 months:  [place] of Information:  Patient, Spouse Patient Interpreter Needed:  None Criminal Activity/Legal Involvement Pertinent to Current Situation/Hospitalization:  No - Comment as needed Significant Relationships:  Spouse, Adult Children Lives with:  Spouse Do you feel safe going back to the place where you live?  Yes Need for family participation in patient care:  Yes (Comment)  Care giving concerns:  Patient spouse states that she is hopeful for patient to go to inpatient rehab, however understands the reason for SNF back up.  Patient spouse is concerned that patient will need to able to walk prior to return home.   Social Worker assessment / plan:  Holiday representative met with patient and patient spouse at bedside to offer support and discuss patient needs at discharge.  Patient states that he currently lives at home with his spouse and it will be too difficult for her to manage him at home following discharge.  Patient and spouse hopeful for inpatient rehab placement but agreeable to SNF back up.  Patient spouse would like to further pursue [facility] if SNF placement is necessary.  CSW to await determination from insurance regarding inpatient rehab prior to initiating SNF authorization.  CSW initiated SNF referral and will follow up with available bed offers.  CSW remains available for support and to facilitate patient discharge needs once medically stable.  Employment  status:  Retired Nurse, adult PT Recommendations:  Inpatient Advance, [place] / Referral to community resources:  [place]  Patient/Family's Response to care:  Patient and patient patient spouse agreeable to SNF back up for inpatient rehab.  Patient spouse verbalizes understanding of CSW role and appreciation for support and concern.    Patient/Family's Understanding of and Emotional Response to Diagnosis, Current Treatment, and Prognosis:  Patient and spouse familiar with patient diagnosis and course of treatment.  Patient and spouse both hopeful for full recovery and realistic about patient limitations at time of discharge.  Emotional Assessment Appearance:  Appears stated age Attitude/Demeanor/Rapport:  Apprehensive Affect (typically observed):  Accepting, Apprehensive, Calm Orientation:  Oriented to Self, Oriented to Place, Oriented to  Time, Oriented to Situation Alcohol / Substance use:  Not Applicable Psych involvement (Current and /or in the community):  No (Comment)  Discharge Needs  Concerns to be addressed:  Discharge Planning Concerns Readmission within the last 30 days:  Yes Current discharge risk:  Physical Impairment Barriers to Discharge:  Continued Medical Work up  [employer], [place]

## 2016-04-11 NOTE — Progress Notes (Signed)
Pt NIF and VC on 04/11/16 at 1015pm.  NIF = -40cmh2o and VC =1.2L

## 2016-04-11 NOTE — Progress Notes (Signed)
[facility] Progress Note  Patient: [name] ZOX:[id number]   PCP: Irven Shelling, MD DOB: [birth year]   DOA: 04/04/2016   DOS: 04/11/2016   Date of Service: the patient was seen and examined on 04/11/2016  Brief hospital course: Pt. with PMH of CKD, CVA, history of GBS, A. fib on chronic anti-coagulation, meningioma, HTN, HLD; admitted on 04/04/2016, with complaint of recurrent fall, was found to have generalized weakness versus suspected GBS. NEuro following, MRIs  Unremarkable, workup ongoing  Assessment and Plan: 1. Bilateral leg weakness/numbness -remote h/o GBS 19 years ago, ? recurrence -MRI brain -chronic occlusion of the right ICA and chronic left hemispheric and to follow malacia underlying craniotomy site, It also shows mildly enlarged meningioma. MRI cervical, thoracic and lumbar spine also unremarkable. -Neurology following -LP 11/22 with Protein of 111 and 0 WBCs-d/w Neurology, concerning for GBS, started IVIG 11/23, day 4/5 now -Spinal fluid with rare Coag negative staph in spinal fluid Cx, with no s/s of meningitis , 0 WBC in spinal fluid, do not think he has meningitis, d/w ID Dr.Van [place] who agrees that this could be a contaminant and recommends to monitor for now -PT consulting, CIR recommended -Hopefully rehab Monday/tuesday after IVIG completed  2. A. fib. -warfarin resumed post LP -continue metoprolol,Cardizem.  3. Chronic kidney disease. -stable. -given PO lasix due to swelling from IVIG  4. Subacute urinary retention. -remains with foley for last 3weeks since last discharge, Seen at [facility] 2 weeks ago, failed Voiding trial and foley was re-inserted -continue Flomax  DVT Prophylaxis: warfarin, add lovenox as INR low Advance goals of care discussion: Full code Family Communication: wifeat bedside Disposition: needs rehab, ? CIR  Consultants: Neurology Procedures: None  Antibiotics: Anti-infectives    None      Subjective:  improving lower leg numbness and weakness  Objective: Physical Exam: Vitals:   04/11/16 0109 04/11/16 0537 04/11/16 0936 04/11/16 1306  BP: (!) 133/59 (!) 131/57 (!) 135/54 (!) 116/56  Pulse: (!) 55 (!) 58 (!) 54 (!) 54  Resp: 18 20 18 18   Temp: 97.5 F (36.4 C) 97.6 F (36.4 C) 98.4 F (36.9 C) 98.7 F (37.1 C)  TempSrc: Oral Oral Oral Oral  SpO2: 94% 96% 98% 96%  Weight:  99 kg (218 lb 4.1 oz)    Height:        Intake/Output Summary (Last 24 hours) at 04/11/16 1503 Last data filed at 04/11/16 0538  Gross per 24 hour  Intake               63 ml  Output             2500 ml  Net            -2437 ml   Filed Weights   04/09/16 0500 04/10/16 0546 04/11/16 0537  Weight: 98 kg (216 lb) 99.9 kg (220 lb 4.8 oz) 99 kg (218 lb 4.1 oz)    General: Alert, Awake and Oriented to Time, Place and Person. Appear in mild distress, affect appropriate Eyes: PERRL, Conjunctiva normal ENT: Oral Mucosa clear moist. Neck: difficult to assess JVD, no Abnormal Mass Or lumps Cardiovascular: S1 and S2 Present, no Murmur, Respiratory: Bilateral Air entry equal and Decreased, no use of accessory muscle, Clear to Auscultation, no Crackles, no wheezes Abdomen: Bowel Sound present, Soft and no tenderness Skin: no redness, no Rash, no induration Extremities: no Pedal edema, no calf tenderness Neurologic: 4/5  bilateral leg weakness, decreased light tough knee down,  Data Reviewed: CBC:  Recent Labs Lab 04/04/16 1848 04/05/16 0543 04/07/16 0914 04/09/16 0244 04/10/16 0250 04/11/16 0229  WBC 14.7* 14.7* 15.0* 13.0* 12.5* 9.1  NEUTROABS 11.2*  --  11.8*  --   --   --   HGB 14.1 12.9* 12.1* 12.6* 12.5* 12.3*  HCT 44.4 41.1 38.9* 39.4 39.2 38.1*  MCV 89.5 90.1 89.6 88.3 88.3 87.8  PLT 248 238 226 234 240 655   Basic Metabolic Panel:  Recent Labs Lab 04/04/16 1848 04/05/16 0543 04/07/16 0914 04/09/16 0017 04/10/16 0250  NA 141 142 138 137 137  K 4.2 4.2 3.9 4.1 3.9  CL 106 109 107 105  102  CO2 25 27 24 24 26   GLUCOSE 117* 104* 166* 120* 118*  BUN 14 12 10 16  22*  CREATININE 1.57* 1.48* 1.47* 1.49* 1.53*  CALCIUM 9.1 8.7* 8.7* 8.8* 9.0  MG 2.3  --  1.9  --   --     Liver Function Tests:  Recent Labs Lab 04/07/16 0914  AST 19  ALT 13*  ALKPHOS 76  BILITOT 0.6  PROT 5.7*  ALBUMIN 2.6*   No results for input(s): LIPASE, AMYLASE in the last 168 hours. No results for input(s): AMMONIA in the last 168 hours. Coagulation Profile:  Recent Labs Lab 04/07/16 0914 04/08/16 0316 04/09/16 0244 04/10/16 0250 04/11/16 0229  INR 1.34 1.29 1.21 1.23 1.37   Cardiac Enzymes: No results for input(s): CKTOTAL, CKMB, CKMBINDEX, TROPONINI in the last 168 hours. BNP (last 3 results) No results for input(s): PROBNP in the last 8760 hours.  CBG:  Recent Labs Lab 04/09/16 0630  GLUCAP 125*    Studies: No results found.   Scheduled Meds: . diltiazem  120 mg Oral Daily  . enoxaparin (LOVENOX) injection  40 mg Subcutaneous Q24H  . Immune Globulin 10%  400 mg/kg Intravenous Q24 Hr x 5  . metoprolol tartrate  50 mg Oral BID  . rosuvastatin  40 mg Oral q1800  . sodium chloride flush  3 mL Intravenous Q12H  . tamsulosin  0.4 mg Oral QPC supper  . vitamin B-12  1,000 mcg Oral Once per day on Wed Sat  . warfarin  7.5 mg Oral ONCE-1800  . Warfarin - Pharmacist Dosing Inpatient   Does not apply q1800   Continuous Infusions: PRN Meds: acetaminophen, diphenhydrAMINE, ondansetron **OR** ondansetron (ZOFRAN) IV  Time spent: 30 minutes  Author: Domenic Polite, MD [facility] Pager: [phone] 04/11/2016 3:03 PM  If 7PM-7AM, please contact night-coverage at [email or web address], password [facility]

## 2016-04-11 NOTE — Plan of Care (Signed)
Problem: Activity: Goal: Risk for activity intolerance will decrease Outcome: Not Progressing Max assist w/equip and 2 persons from bed to chair-pt not taking any steps, just sl pivot in [employer] lift.

## 2016-04-12 ENCOUNTER — Encounter (HOSPITAL_COMMUNITY): Payer: Self-pay | Admitting: Physical Medicine and Rehabilitation

## 2016-04-12 DIAGNOSIS — I480 Paroxysmal atrial fibrillation: Secondary | ICD-10-CM

## 2016-04-12 DIAGNOSIS — N289 Disorder of kidney and ureter, unspecified: Secondary | ICD-10-CM

## 2016-04-12 DIAGNOSIS — Z8669 Personal history of other diseases of the nervous system and sense organs: Secondary | ICD-10-CM

## 2016-04-12 DIAGNOSIS — I10 Essential (primary) hypertension: Secondary | ICD-10-CM

## 2016-04-12 DIAGNOSIS — G610 Guillain-Barre syndrome: Secondary | ICD-10-CM | POA: Diagnosis not present

## 2016-04-12 DIAGNOSIS — R339 Retention of urine, unspecified: Secondary | ICD-10-CM

## 2016-04-12 DIAGNOSIS — Z86018 Personal history of other benign neoplasm: Secondary | ICD-10-CM

## 2016-04-12 DIAGNOSIS — N183 Chronic kidney disease, stage 3 unspecified: Secondary | ICD-10-CM

## 2016-04-12 DIAGNOSIS — Z8673 Personal history of transient ischemic attack (TIA), and cerebral infarction without residual deficits: Secondary | ICD-10-CM

## 2016-04-12 DIAGNOSIS — R29898 Other symptoms and signs involving the musculoskeletal system: Secondary | ICD-10-CM | POA: Diagnosis not present

## 2016-04-12 DIAGNOSIS — Z7901 Long term (current) use of anticoagulants: Secondary | ICD-10-CM

## 2016-04-12 LAB — PROTIME-INR
INR: 1.66
Prothrombin Time: 19.8 seconds — ABNORMAL HIGH (ref 11.4–15.2)

## 2016-04-12 LAB — CBC
HEMATOCRIT: 39.1 % (ref 39.0–52.0)
HEMOGLOBIN: 12.5 g/dL — AB (ref 13.0–17.0)
MCH: 28.2 pg (ref 26.0–34.0)
MCHC: 32 g/dL (ref 30.0–36.0)
MCV: 88.1 fL (ref 78.0–100.0)
Platelets: 217 10*3/uL (ref 150–400)
RBC: 4.44 MIL/uL (ref 4.22–5.81)
RDW: 13.2 % (ref 11.5–15.5)
WBC: 9.9 10*3/uL (ref 4.0–10.5)

## 2016-04-12 LAB — CSF CULTURE

## 2016-04-12 LAB — CSF CULTURE W GRAM STAIN

## 2016-04-12 MED ORDER — WARFARIN SODIUM 7.5 MG PO TABS
7.5000 mg | ORAL_TABLET | Freq: Once | ORAL | Status: AC
  Administered 2016-04-12: 7.5 mg via ORAL
  Filled 2016-04-12: qty 1

## 2016-04-12 NOTE — Progress Notes (Signed)
[facility] Progress Note  Patient: [name] WER:[id number]   PCP: Irven Shelling, MD DOB: 09-Mar-1941   DOA: 04/04/2016   DOS: 04/12/2016   Date of Service: the patient was seen and examined on 04/12/2016  Brief hospital course: Pt. with PMH of CKD, CVA, history of GBS, A. fib on chronic anti-coagulation, meningioma, HTN, HLD; admitted on 04/04/2016, with complaint of recurrent fall, was found to have generalized weakness versus suspected GBS. NEuro following, MRIs  Unremarkable, workup ongoing  Assessment and Plan: 1. Bilateral leg weakness/numbness -remote h/o GBS 19 years ago, ? recurrence -MRI brain -chronic occlusion of the right ICA and chronic left hemispheric and to follow malacia underlying craniotomy site, It also shows mildly enlarged meningioma. MRI cervical, thoracic and lumbar spine also unremarkable. -Neurology following -LP 11/22 with Protein of 111 and 0 WBCs-d/w Neurology, concerning for GBS, started IVIG 11/23, day 4/5 now -Spinal fluid with rare Coag negative staph in spinal fluid Cx, with no s/s of meningitis , 0 WBC in spinal fluid, do not think he has meningitis, d/w ID Dr.Van Dam who agrees that this could be a contaminant and recommends to monitor for now -PT consulting, CIR recommended -Hopefully rehab after IVIG completed  2. A. fib. -warfarin resumed post LP -continue metoprolol,Cardizem.  3. Chronic kidney disease. -stable. -PO lasix due to swelling from IVIG  4. Subacute urinary retention. -remains with foley for last 3weeks since last discharge, Seen at [facility] 2 weeks ago, failed Voiding trial and foley was re-inserted -continue Flomax  DVT Prophylaxis: warfarin, add lovenox as INR low Advance goals of care discussion: Full code Family Communication: no family at bedside Disposition: needs rehab, ? CIR  Consultants: Neurology Procedures: None  Antibiotics: Anti-infectives    None      Subjective: improving lower leg  numbness and weakness  Objective: Physical Exam: Vitals:   04/12/16 1056 04/12/16 1608 04/12/16 1650 04/12/16 1800  BP: (!) 123/50 137/63 (!) 144/63 122/68  Pulse: 62 (!) 55 (!) 56 (!) 55  Resp: 20 20  18   Temp: 97.7 F (36.5 C)  97.8 F (36.6 C) 98.2 F (36.8 C)  TempSrc: Oral  Oral Oral  SpO2: 98% 98%  96%  Weight:      Height:        Intake/Output Summary (Last 24 hours) at 04/12/16 1921 Last data filed at 04/11/16 2045  Gross per 24 hour  Intake                0 ml  Output             1500 ml  Net            -1500 ml   Filed Weights   04/09/16 0500 04/10/16 0546 04/11/16 0537  Weight: 98 kg (216 lb) 99.9 kg (220 lb 4.8 oz) 99 kg (218 lb 4.1 oz)    General: Alert, Awake and Oriented to Time, Place and Person. Appear in mild distress, affect appropriate Eyes: PERRL, Conjunctiva normal ENT: Oral Mucosa clear moist. Neck: difficult to assess JVD, no Abnormal Mass Or lumps Cardiovascular: S1 and S2 Present, no Murmur, Respiratory: Bilateral Air entry equal and Decreased, no use of accessory muscle, Clear to Auscultation, no Crackles, no wheezes Abdomen: Bowel Sound present, Soft and no tenderness Skin: no redness, no Rash, no induration Extremities: no Pedal edema, no calf tenderness Neurologic: 4/5  bilateral leg weakness, decreased light tough knee down,  Data Reviewed: CBC:  Recent Labs Lab 04/07/16 0914 04/09/16  5621 04/10/16 0250 04/11/16 0229 04/12/16 0655  WBC 15.0* 13.0* 12.5* 9.1 9.9  NEUTROABS 11.8*  --   --   --   --   HGB 12.1* 12.6* 12.5* 12.3* 12.5*  HCT 38.9* 39.4 39.2 38.1* 39.1  MCV 89.6 88.3 88.3 87.8 88.1  PLT 226 234 240 231 308   Basic Metabolic Panel:  Recent Labs Lab 04/07/16 0914 04/09/16 0017 04/10/16 0250  NA 138 137 137  K 3.9 4.1 3.9  CL 107 105 102  CO2 24 24 26   GLUCOSE 166* 120* 118*  BUN 10 16 22*  CREATININE 1.47* 1.49* 1.53*  CALCIUM 8.7* 8.8* 9.0  MG 1.9  --   --     Liver Function Tests:  Recent Labs Lab  04/07/16 0914  AST 19  ALT 13*  ALKPHOS 76  BILITOT 0.6  PROT 5.7*  ALBUMIN 2.6*   No results for input(s): LIPASE, AMYLASE in the last 168 hours. No results for input(s): AMMONIA in the last 168 hours. Coagulation Profile:  Recent Labs Lab 04/08/16 0316 04/09/16 0244 04/10/16 0250 04/11/16 0229 04/12/16 0655  INR 1.29 1.21 1.23 1.37 1.66   Cardiac Enzymes: No results for input(s): CKTOTAL, CKMB, CKMBINDEX, TROPONINI in the last 168 hours. BNP (last 3 results) No results for input(s): PROBNP in the last 8760 hours.  CBG:  Recent Labs Lab 04/09/16 0630  GLUCAP 125*    Studies: No results found.   Scheduled Meds: . diltiazem  120 mg Oral Daily  . enoxaparin (LOVENOX) injection  40 mg Subcutaneous Q24H  . metoprolol tartrate  50 mg Oral BID  . rosuvastatin  40 mg Oral q1800  . sodium chloride flush  3 mL Intravenous Q12H  . tamsulosin  0.4 mg Oral QPC supper  . vitamin B-12  1,000 mcg Oral Once per day on Wed Sat  . Warfarin - Pharmacist Dosing Inpatient   Does not apply q1800   Continuous Infusions: PRN Meds: acetaminophen, diphenhydrAMINE, ondansetron **OR** ondansetron (ZOFRAN) IV  Time spent: 30 minutes  Author: Author: Berle Mull, MD [facility] 04/12/2016 7:22 PM   If 7PM-7AM, please contact night-coverage at [email or web address], password [facility]

## 2016-04-12 NOTE — Consult Note (Signed)
Physical Medicine and Rehabilitation Consult   Reason for Consult: GBS Referring Physician: Dr. Broadus John.    HPI: [name] is a 75 y.o. male with history of HTN, CKD, recent diagnosis of PAF, CVA, crani for meningioma resection, recent admissions for urosepsis with urinary retention and left knee cellulitis, h/o GBS who was admitted on 04/04/16 with 2-3 day history of progressive BLE numbness with tingling and falls. MRI spine without evidence of significant stenosis. MRI brain with mildly enlarged right paracentral meningioma and stable left clinoid process meningioma. Neurology evaluated patient and exam consistent with significant BLE weakness with areflexia and BUE with hyporeflexia. He was started on IVIG on 11/19 without significant improvement. LP 11/22  with elevated levels of glucose and protein with cytoalbuminologic dissociation concerning for GBS and he was started on another round of IVIG. CSF cultures with rare staph that was felt to be contaminant per Dr. Tommy Medal who recommended monitoring at this time. BLE edema treated with diuresis. PT ongoing revealing diffuse weakness requiring Stedy to stand. with inability to stand and CIR recommended for follow up therapy.   Was independent without AD till Oct. Works on farm and cuts wood. He failed voiding trial at GU office and foley continues. Wife in good health. They live on a farm and son lives across from them. Family can assist after discharge.    Review of Systems  HENT: Negative for hearing loss and tinnitus.   Eyes: Negative for blurred vision and double vision.  Respiratory: Negative for cough, shortness of breath and wheezing.   Cardiovascular: Negative for chest pain and palpitations.  Gastrointestinal: Negative for abdominal pain, constipation and heartburn.  Genitourinary: Negative for dysuria.  Musculoskeletal: Positive for joint pain (left hip). Negative for back pain.  Neurological: Positive for sensory  change, focal weakness and weakness. Negative for speech change.  Psychiatric/Behavioral: The patient is not nervous/anxious and does not have insomnia.   All other systems reviewed and are negative.     Past Medical History:  Diagnosis Date  . Atrial fibrillation ([place]) 02/2016   NEW ONSET  . Brain tumor (benign) ([place]) 1998   2 small tumors- since 2015 (Dr. [name] following)  . Carotid artery occlusion   . Cerebrovascular disease September 09, 2001   TIA, Left brain  . CKD (chronic kidney disease), stage III   . Diverticulosis   . ED (erectile dysfunction)   . Guillain-Barre syndrome ([place]) 1999  . Hx of elevated lipids   . Hyperlipidemia   . Hypertension   . Memory loss   . Stroke [facility]) 2003    Past Surgical History:  Procedure Laterality Date  . BRAIN SURGERY  1998   Benign brain tumor removed, Left hemisphere- ? Meningioma  . CAROTID ENDARTERECTOMY Left September 13, 2001   LEFT cea  . CHOLECYSTECTOMY  Nov. 2001  . COLON SURGERY  Feb. 2003   Colonic polyps removed endoscopically  . COLONOSCOPY WITH PROPOFOL N/A 11/11/2015   Procedure: COLONOSCOPY WITH PROPOFOL;  Surgeon: Garlan Fair, MD;  Location: [facility] ENDOSCOPY;  Service: Endoscopy;  Laterality: N/A;  . INGUINAL HERNIA REPAIR  1970's    Family History  Problem Relation Age of Onset  . Colon cancer Mother     Metastatic   . Cancer Mother     Colon  . Stroke Father   . Diabetes Father     Social History:  Married. Independent with walker use for past 1.5 months. 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 He reports that he quit smoking about 19 years ago. His smoking use included Cigarettes. He has never used smokeless tobacco. He reports that he does not drink alcohol or use drugs.   Allergies  Allergen Reactions  . Asa [Aspirin] Other (See Comments)    Severe skin bruising   . Immune Globulins Other (See Comments)    Tetanus Immunes Globulins    (  Pt. Cannot take the Flu shot  ) Guillian Barre     Medications Prior to Admission  Medication Sig Dispense Refill  . acetaminophen (TYLENOL) 500 MG tablet Take 1,000 mg by mouth every 6 (six) hours as needed for headache (pain).    . cholecalciferol (VITAMIN D) 1000 units tablet Take 1,000 Units by mouth daily.    Marland Kitchen diltiazem (CARDIZEM CD) 120 MG 24 hr capsule Take 1 capsule (120 mg total) by mouth daily. 30 capsule 0  . furosemide (LASIX) 80 MG tablet Take 40-80 mg by mouth daily as needed for fluid or edema (weight gain of 2 lbs in 24 hours or 5 lbs in a week).     . metoprolol (LOPRESSOR) 50 MG tablet Take 1 tablet (50 mg total) by mouth 2 (two) times daily. 60 tablet 0  . rosuvastatin (CRESTOR) 40 MG tablet Take 40 mg by mouth daily.    . tamsulosin (FLOMAX) 0.4 MG CAPS capsule Take 1 capsule (0.4 mg total) by mouth daily after supper. 30 capsule 0  . vitamin B-12 (CYANOCOBALAMIN) 1000 MCG tablet Take 1,000 mcg by mouth 2 (two) times a week. Wednesday and Saturday    . warfarin (COUMADIN) 5 MG tablet Take 1 tablet (5 mg total) by mouth daily at 6 PM. 30 tablet 0    Home: Home Living Family/patient expects to be discharged to:: Private residence Living Arrangements: Spouse/significant other Available Help at Discharge: Family (wife, son) Type of Home: House Home Access: Stairs to enter Technical brewer of Steps: 2 Home Layout: Two level, Able to live on main level with bedroom/bathroom Home Equipment: Walker - 2 wheels, Bedside commode (walker is borrowed)  Functional History: Prior Function Level of Independence: Independent with assistive device(s) Comments: Pt had recent admission with Lt knee cellulitis and has been using RW since then. Receiving HHPT PTA Functional Status:  Mobility: Bed Mobility Overal bed mobility: Needs Assistance Bed Mobility: Supine to Sit Rolling: Supervision Sidelying to sit: Supervision Supine to sit: Min assist Sit to supine: Supervision General bed mobility  comments: Pt required cues for hand placement and use of pad to advance LEs to edge of bed.    Transfers Overall transfer level: Needs assistance Equipment used: Rolling walker (2 wheeled) Transfer via Lift Equipment: Stedy Transfers: Sit to/from Stand Sit to Stand: Mod assist, +2 physical assistance General transfer comment: Pt able to pull up on stedy bars while PTA and SPTA lifted patient into standing.  Pt performed transfer from bed and BSC.  Pt performed 10 partial sit to stand for strength from stedy frame plates.   Ambulation/Gait Ambulation/Gait assistance:  (unable.  ) General Gait Details: unable    ADL:    Cognition: Cognition Overall Cognitive Status: Within Functional Limits for tasks assessed Orientation Level: Oriented X4 Cognition Arousal/Alertness: Awake/alert Behavior During Therapy: WFL for tasks assessed/performed Overall Cognitive Status: Within Functional Limits for tasks assessed   Blood pressure (!) 131/55, pulse (!) 57, temperature 98 F (36.7 C), temperature source Oral, resp. rate 20, height 5\' 11"  (1.803 m),  weight 99 kg (218 lb 4.1 oz), SpO2 97 %. Physical Exam  Nursing note and vitals reviewed. Constitutional: He is oriented to person, place, and time. He appears well-developed and well-nourished.  HENT:  Head: Normocephalic and atraumatic.  Mouth/Throat: Oropharynx is clear and moist.  Eyes: Conjunctivae and EOM are normal. Pupils are equal, round, and reactive to light.  Neck: Normal range of motion. Neck supple.  Cardiovascular: Normal rate and regular rhythm.   Respiratory: Effort normal and breath sounds normal. Stridor present. No respiratory distress. He has no wheezes.  GI: Soft. Bowel sounds are normal. He exhibits distension. There is no tenderness.  Musculoskeletal: He exhibits edema (1+  edema BLE). He exhibits no tenderness.  Neurological: He is alert and oriented to person, place, and time.  Minimal dysarthria.  Able to follow  basic commands without difficulty.  Sensory deficits b/l plantar feet (baseline) DTRs 1+ RUE with pronator drift with proximal weakness.  Motor: RUE/RLE: 4/5 proximal to distal LUE/LLE: 4-/5 proximal to distal  Skin: Skin is warm and dry. No rash noted. No erythema.  Psychiatric: He has a normal mood and affect. Thought content normal.    Results for orders placed or performed during the hospital encounter of 04/04/16 (from the past 24 hour(s))  Protime-INR     Status: Abnormal   Collection Time: 04/12/16  6:55 AM  Result Value Ref Range   Prothrombin Time 19.8 (H) 11.4 - 15.2 seconds   INR 1.66   CBC     Status: Abnormal   Collection Time: 04/12/16  6:55 AM  Result Value Ref Range   WBC 9.9 4.0 - 10.5 K/uL   RBC 4.44 4.22 - 5.81 MIL/uL   Hemoglobin 12.5 (L) 13.0 - 17.0 g/dL   HCT 39.1 39.0 - 52.0 %   MCV 88.1 78.0 - 100.0 fL   MCH 28.2 26.0 - 34.0 pg   MCHC 32.0 30.0 - 36.0 g/dL   RDW 13.2 11.5 - 15.5 %   Platelets 217 150 - 400 K/uL   No results found.  Assessment/Plan: Diagnosis: GBS Labs and images independently reviewed.  Records reviewed and summated above.  1. Does the need for close, 24 hr/day medical supervision in concert with the patient's rehab needs make it unreasonable for this patient to be served in a less intensive setting? Yes  2. Co-Morbidities requiring supervision/potential complications: HTN (monitor and provide prns in accordance with increased physical exertion and pain), CKD (avoid nephrotoxic meds), PAF (cont meds, monitor HR with increased physical activity),  History of CVA , crani for meningioma resection, recent admissions for urosepsis, urinary retention (foley per Urology), and left knee cellulitis, h/o GBS 3. Due to bladder management, safety, disease management and patient education, does the patient require 24 hr/day rehab nursing? Yes 4. Does the patient require coordinated care of a physician, rehab nurse, PT (1-2 hrs/day, 5 days/week) and OT  (1-2 hrs/day, 5 days/week) to address physical and functional deficits in the context of the above medical diagnosis(es)? Yes Addressing deficits in the following areas: balance, endurance, locomotion, strength, transferring, dressing, toileting and psychosocial support 5. Can the patient actively participate in an intensive therapy program of at least 3 hrs of therapy per day at least 5 days per week? Yes 6. The potential for patient to make measurable gains while on inpatient rehab is excellent 7. Anticipated functional outcomes upon discharge from inpatient rehab are supervision  with PT, supervision with OT, n/a with SLP. 8. Estimated rehab length of stay to  reach the above functional goals is: 14-17 days. 9. Does the patient have adequate social supports and living environment to accommodate these discharge functional goals? Yes 10. Anticipated D/C setting: Home 11. Anticipated post D/C treatments: HH therapy and Home excercise program 12. Overall Rehab/Functional Prognosis: good  RECOMMENDATIONS: This patient's condition is appropriate for continued rehabilitative care in the following setting: CIR Patient has agreed to participate in recommended program. Yes Note that insurance prior authorization may be required for reimbursement for recommended care.  Comment: Rehab Admissions Coordinator to follow up.  Delice Lesch, MD, Mellody Drown 04/12/2016

## 2016-04-12 NOTE — Progress Notes (Signed)
Physical Therapy Treatment Patient Details Name: [name] MRN: [id number] DOB: August 27, 1940 Today's Date: 04/12/2016    History of Present Illness 75 y.o.malewho presents today with 2-3 day history of new onset bilateral lower extremity numbness and weakness. His PMHx includes prior CVA, left CEA, craniotomy for meningioma resection, GBS in 1999, HTN, HLD, CKD Stage 3 and recently diagnosed paroxysmal atrial fibrillation. He takes Coumadin at home.. Patient began having falls at home just prior to admission and fell 11/20 pm while hospitalized.    PT Comments    Pt progressing well. He was able to take pivot steps with RW bed to recliner. Sit to stand performed from recliner for pt to perform standing exercises with RW support. No knee buckling noted during mobility or exercises.   Follow Up Recommendations  CIR     Equipment Recommendations  Other (comment) (TBD at next venue)    Recommendations for Other Services OT consult;Rehab consult     Precautions / Restrictions Precautions Precautions: Fall Precaution Comments: Only recent h/o falls. Pt independent with steady gait prior to acute illness.    Mobility  Bed Mobility         Supine to sit: Min assist     General bed mobility comments: +rail, assist with BLE OOB  Transfers   Equipment used: Rolling walker (2 wheeled) Transfers: Sit to/from [facility] Sit to Stand: Mod assist;+2 physical assistance Stand pivot transfers: Mod assist;+2 safety/equipment       General transfer comment: Pivot steps with RW bed to recliner. No knee buckling noted.  Ambulation/Gait             General Gait Details: unable   Stairs            Wheelchair Mobility    Modified Rankin (Stroke Patients Only)       Balance                                    Cognition Arousal/Alertness: Awake/alert Behavior During Therapy: WFL for tasks assessed/performed Overall  Cognitive Status: Within Functional Limits for tasks assessed                      Exercises General Exercises - Lower Extremity Ankle Circles/Pumps: AROM;Both;10 reps;Seated Long Arc Quad: AROM;Right;Left;5 reps;Seated Hip Flexion/Marching: AROM;Right;Left;Standing;10 reps Mini-Sqauts: AROM;Both;10 reps    General Comments        Pertinent Vitals/Pain Pain Assessment: No/denies pain    Home Living                      Prior Function            PT Goals (current goals can now be found in the care plan section) Acute Rehab PT Goals Patient Stated Goal: to get out of the bed.   PT Goal Formulation: With patient/family Time For Goal Achievement: 04/20/16 Potential to Achieve Goals: Fair Progress towards PT goals: Progressing toward goals    Frequency    Min 3X/week      PT Plan Current plan remains appropriate    Co-evaluation             End of Session Equipment Utilized During Treatment: Gait belt Activity Tolerance: Patient tolerated treatment well Patient left: in chair;with call bell/phone within reach;with chair alarm set     Time: 4166-0630 PT Time Calculation (min) (ACUTE ONLY): 26 min  Charges:  $Therapeutic Exercise: 8-22 mins $Therapeutic Activity: 8-22 mins                    G Codes:      Lorriane Shire 04/12/2016, 10:01 AM

## 2016-04-12 NOTE — Care Management Important Message (Signed)
Important Message  Patient Details  Name: [name] MRN: [id number] Date of Birth: Apr 06, 1941   Medicare Important Message Given:  Other (see comment)    B[place]roft, Emmabelle Fear Abena 04/12/2016, 11:56 AM

## 2016-04-12 NOTE — Progress Notes (Signed)
Changed out foley drain bag aseptically (the other was leaking).

## 2016-04-12 NOTE — Progress Notes (Signed)
Neurology Progress Note  Subjective: Chart reviewed in detail. In brief, this is a 11-yo RH man who was initially admitted on 04/04/16 for evaluation of progressive BLE weakness and numbness. It was felt that his symptoms were likely due to Guillain-Barre syndrome and he was admitted. LP was performed on 11/22 (was was on warfarin at home and this precluded earlier LP) which showed cytoalbuminologic dissociation consistent with GBS. He was started on IVIg on 11/23 and has noted some improvement with treatment. No complications from IVIg.   He reports that his sensation and weakness continue to improve in both legs, particularly the sensory loss. He denies any new neurologic symptoms. He has no headache, neck pain or back pain. He is not experiencing any dysesthesias or muscle cramps. He is eating well, no dysphagia. He denies any shortness of breath. The remainder of his ROS in unremarkable.   Current Meds:   Current Facility-Administered Medications:  .  acetaminophen (TYLENOL) tablet 650 mg, 650 mg, Oral, Q6H PRN, Lavina Hamman, MD, 650 mg at 04/11/16 1551 .  diltiazem (CARDIZEM CD) 24 hr capsule 120 mg, 120 mg, Oral, Daily, Jessica U Vann, DO, 120 mg at 04/11/16 1032 .  diphenhydrAMINE (BENADRYL) capsule 25 mg, 25 mg, Oral, Daily PRN, Domenic Polite, MD, 25 mg at 04/11/16 1551 .  enoxaparin (LOVENOX) injection 40 mg, 40 mg, Subcutaneous, Q24H, Domenic Polite, MD, 40 mg at 04/11/16 1032 .  Immune Globulin 10% (PRIVIGEN) IV infusion 40 g, 400 mg/kg, Intravenous, Q24 Hr x 5, Doren Custard, MD, Last Rate: 48 mL/hr at 04/11/16 1656, 40 g at 04/11/16 1656 .  metoprolol (LOPRESSOR) tablet 50 mg, 50 mg, Oral, BID, Domenic Polite, MD, 50 mg at 04/11/16 2250 .  ondansetron (ZOFRAN) tablet 4 mg, 4 mg, Oral, Q6H PRN **OR** ondansetron (ZOFRAN) injection 4 mg, 4 mg, Intravenous, Q6H PRN, Geradine Girt, DO .  rosuvastatin (CRESTOR) tablet 40 mg, 40 mg, Oral, q1800, Tomi Bamberger Vann, DO, 40 mg at 04/11/16 1731 .   sodium chloride flush (NS) 0.9 % injection 3 mL, 3 mL, Intravenous, Q12H, Jessica U Vann, DO, 3 mL at 04/11/16 1029 .  tamsulosin (FLOMAX) capsule 0.4 mg, 0.4 mg, Oral, QPC supper, Geradine Girt, DO, 0.4 mg at 04/11/16 1731 .  vitamin B-12 (CYANOCOBALAMIN) tablet 1,000 mcg, 1,000 mcg, Oral, Once per day on Wed Sat, Jessica U Vann, DO, 1,000 mcg at 04/10/16 6213 .  Warfarin - Pharmacist Dosing Inpatient, , Does not apply, q1800, Honor Loh, RPH  Objective:  Temp:  [97.7 F (36.5 C)-98.7 F (37.1 C)] 98 F (36.7 C) (11/27 0526) Pulse Rate:  [47-57] 57 (11/27 0526) Resp:  [18-20] 20 (11/27 0526) BP: (112-139)/(49-63) 131/55 (11/27 0526) SpO2:  [92 %-98 %] 97 % (11/27 0526)  General: WDWN in NAD. Alert, oriented x4. Speech is clear without dysarthria. Affect is bright. Comportment is normal.  HEENT: Neck is supple without lymphadenopathy. Mucous membranes are moist and the oropharynx is clear. Sclerae are anicteric. There is no conjunctival injection.  CV: Regular, no murmur. Carotid pulses are 2+ and symmetric with no bruits. Distal pulses 2+ and symmetric.  Lungs: CTAB  Extremities: No cyanosis. Mild edema B ankles, L>R. Neuro: MS: As noted above. No aphasia.  CN: Pupils are equal and reactive from 3-->2 mm bilaterally. Visual fields are full to confrontation. EOMI with some breakup of smooth pursuits, no nystagmus. Facial sensation is intact to light touch. Face is symmetric at rest with normal strength and mobility. Hearing is  intact to conversational voice. Voice is normal in tone and quality. Palate elevates symmetrically. Uvula is midline. Bilateral SCM and trapezii are 5/5. Tongue is midline with normal bulk and mobility.  Motor: Normal bulk, tone. Strength is normal with the exception of 4+/5 L KF/KE and B DF; 4/5 R PF; 4-/5 L PF. No pronator drift. He has a mild intention tremor in BUE, L>R.  Sensation: Light touch and pinprick, vibration are decreased in BLE to the level of the  knees.  DTRs: Trace in [place], absent in [place]. Toes are downgoing bilaterally. No pathological reflexes.  Coordination: Finger-to-nose and heel-to-shin are without dysmetria bilaterally.    Labs: Lab Results  Component Value Date   WBC 9.9 04/12/2016   HGB 12.5 (L) 04/12/2016   HCT 39.1 04/12/2016   PLT 217 04/12/2016   GLUCOSE 118 (H) 04/10/2016   ALT 13 (L) 04/07/2016   AST 19 04/07/2016   NA 137 04/10/2016   K 3.9 04/10/2016   CL 102 04/10/2016   CREATININE 1.53 (H) 04/10/2016   BUN 22 (H) 04/10/2016   CO2 26 04/10/2016   TSH 4.274 04/04/2016   INR 1.66 04/12/2016   HGBA1C 6.2 (H) 03/07/2016   CBC Latest Ref Rng & Units 04/12/2016 04/11/2016 04/10/2016  WBC 4.0 - 10.5 K/uL 9.9 9.1 12.5(H)  Hemoglobin 13.0 - 17.0 g/dL 12.5(L) 12.3(L) 12.5(L)  Hematocrit 39.0 - 52.0 % 39.1 38.1(L) 39.2  Platelets 150 - 400 K/uL 217 231 240    Lab Results  Component Value Date   HGBA1C 6.2 (H) 03/07/2016   Lab Results  Component Value Date   ALT 13 (L) 04/07/2016   AST 19 04/07/2016   ALKPHOS 76 04/07/2016   BILITOT 0.6 04/07/2016    Radiology: There is no new neuroimaging for review.   A/P:   1. AIDP (Guillain-Barre Syndrome): Presentation and LP compatible with AIDP. He was recently treated for urosepsis and LLE cellulitis but no reported URI, GI illness, or vaccination prior to onset of LE symptoms. Today is day #5/5 of IVIg with improvement in his symptoms. Management from this point is supportive.   2. BLE weakness: He continues to have distal weakness in BLE. This is acute, due to AIDP. PT, rehab.  3. BLE sensory loss: He has a mild stocking distribution sensory loss in BLE. This is acute, due to AIDP. PT, rehab.   4. Abnormality of gait: This is acute, due to AIDP with resultant weakness and numbness in the LEs. PT, rehab.   This was discussed with the patient and he is in agreement with the plan as noted. He was given the opportunity to ask any questions and these were  addressed to his satisfaction.   I also briefly discussed his care with Dr. Posey Pronto, his attending hospitalist.  Thank you for the opportunity to participate in his care. I will continue to follow with you. Please call with any questions or concerns.   A total of 35 minutes was spent on this patient, including review of available records, discussion with patient, and coordination of care.   Melba Coon, MD [facility]

## 2016-04-12 NOTE — Progress Notes (Signed)
Rehab admissions - I met with patient and his wife at the bedside.  They would like inpatient rehab admission.  I will need OT eval and then can request acute inpatient rehab admission from [place].  I will update all once I hear back from insurance carrier.  Call me for questions.  #[phone]

## 2016-04-12 NOTE — Progress Notes (Signed)
[place] for warfarin Indication: atrial fibrillation and CVA  Allergies  Allergen Reactions  . Asa [Aspirin] Other (See Comments)    Severe skin bruising   . Immune Globulins Other (See Comments)    Tetanus Immunes Globulins    (  Pt. Cannot take the Flu shot ) [name]     Patient Measurements: Height: 5\' 11"  (180.3 cm) Weight: 218 lb 4.1 oz (99 kg) IBW/kg (Calculated) : 75.3   Vital Signs: Temp: 97.7 F (36.5 C) (11/27 1056) Temp Source: Oral (11/27 1056) BP: 123/50 (11/27 1056) Pulse Rate: 62 (11/27 1056)  Labs:  Recent Labs  04/10/16 0250 04/11/16 0229 04/12/16 0655  HGB 12.5* 12.3* 12.5*  HCT 39.2 38.1* 39.1  PLT 240 231 217  LABPROT 15.6* 17.0* 19.8*  INR 1.23 1.37 1.66  CREATININE 1.53*  --   --     Estimated Creatinine Clearance: 50 mL/min (by C-G formula based on SCr of 1.53 mg/dL (H)).  Assessment: 75 yo M on warfarin PTA for CVA and atrial fibrillation presents to ED with leg weakness with concern for Guillian-Barr syndrome (patient has history). Warfarin initially held due to need for LP. LP performed on 11/22 at 1426. Warfarin resumed 11/23.   INR subtherapeutic at 1.66 but trending up. CBC stable. No overt s/sx bleeding noted. Also on Lovenox at VTE prophylaxis dose until INR therapeutic.   [PTA dose warfarin 5 mg daily]  Now on Day 5/5 of IVIG treatment for GBS.    Goal of Therapy:  INR 2-3 Monitor platelets by anticoagulation protocol: Yes   Plan:  Warfarin 7.5 mg PO tonight  INR daily Monitor for s/xs bleeding  Discontinue Lovenox when INR >2    Renold Genta, PharmD, BCPS Clinical Pharmacist Phone for today - [place] - [phone] 04/12/2016 1:18 PM

## 2016-04-12 NOTE — Progress Notes (Signed)
Nif:  -30 VC: 2.4L  Patient had good effort.

## 2016-04-13 DIAGNOSIS — N289 Disorder of kidney and ureter, unspecified: Secondary | ICD-10-CM | POA: Diagnosis not present

## 2016-04-13 DIAGNOSIS — R29898 Other symptoms and signs involving the musculoskeletal system: Secondary | ICD-10-CM | POA: Diagnosis not present

## 2016-04-13 DIAGNOSIS — Z8673 Personal history of transient ischemic attack (TIA), and cerebral infarction without residual deficits: Secondary | ICD-10-CM | POA: Diagnosis not present

## 2016-04-13 DIAGNOSIS — G610 Guillain-Barre syndrome: Secondary | ICD-10-CM | POA: Diagnosis not present

## 2016-04-13 LAB — CBC WITH DIFFERENTIAL/PLATELET
Basophils Absolute: 0 10*3/uL (ref 0.0–0.1)
Basophils Relative: 0 %
EOS ABS: 0.3 10*3/uL (ref 0.0–0.7)
EOS PCT: 4 %
HCT: 38.1 % — ABNORMAL LOW (ref 39.0–52.0)
Hemoglobin: 12.1 g/dL — ABNORMAL LOW (ref 13.0–17.0)
LYMPHS ABS: 1.8 10*3/uL (ref 0.7–4.0)
Lymphocytes Relative: 20 %
MCH: 28 pg (ref 26.0–34.0)
MCHC: 31.8 g/dL (ref 30.0–36.0)
MCV: 88.2 fL (ref 78.0–100.0)
MONOS PCT: 8 %
Monocytes Absolute: 0.8 10*3/uL (ref 0.1–1.0)
Neutro Abs: 6.3 10*3/uL (ref 1.7–7.7)
Neutrophils Relative %: 68 %
PLATELETS: 218 10*3/uL (ref 150–400)
RBC: 4.32 MIL/uL (ref 4.22–5.81)
RDW: 13.5 % (ref 11.5–15.5)
WBC: 9.2 10*3/uL (ref 4.0–10.5)

## 2016-04-13 LAB — COMPREHENSIVE METABOLIC PANEL
ALK PHOS: 79 U/L (ref 38–126)
ALT: 20 U/L (ref 17–63)
ANION GAP: 5 (ref 5–15)
AST: 23 U/L (ref 15–41)
Albumin: 2.2 g/dL — ABNORMAL LOW (ref 3.5–5.0)
BUN: 20 mg/dL (ref 6–20)
CALCIUM: 8.6 mg/dL — AB (ref 8.9–10.3)
CHLORIDE: 107 mmol/L (ref 101–111)
CO2: 24 mmol/L (ref 22–32)
Creatinine, Ser: 1.37 mg/dL — ABNORMAL HIGH (ref 0.61–1.24)
GFR, EST AFRICAN AMERICAN: 57 mL/min — AB (ref >60)
GFR, EST NON AFRICAN AMERICAN: 49 mL/min — AB (ref >60)
Glucose, Bld: 114 mg/dL — ABNORMAL HIGH (ref 65–99)
Potassium: 3.6 mmol/L (ref 3.5–5.1)
SODIUM: 136 mmol/L (ref 135–145)
Total Bilirubin: 0.2 mg/dL — ABNORMAL LOW (ref 0.3–1.2)
Total Protein: 7.8 g/dL (ref 6.5–8.1)

## 2016-04-13 LAB — MAGNESIUM: MAGNESIUM: 2 mg/dL (ref 1.7–2.4)

## 2016-04-13 LAB — PROTIME-INR
INR: 2
Prothrombin Time: 23 seconds — ABNORMAL HIGH (ref 11.4–15.2)

## 2016-04-13 MED ORDER — WARFARIN SODIUM 7.5 MG PO TABS
7.5000 mg | ORAL_TABLET | Freq: Once | ORAL | Status: AC
Start: 2016-04-13 — End: 2016-04-13
  Administered 2016-04-13: 7.5 mg via ORAL
  Filled 2016-04-13: qty 1

## 2016-04-13 NOTE — Progress Notes (Signed)
RT note:   NIF: -40 cmh2o VC: 1.2L  Good effort.  Patient in no distress at this time.

## 2016-04-13 NOTE — Progress Notes (Signed)
Rehab admissions - I spoke with Amy White at [facility].  We will not have a determination about possible inpatient rehab admission for patient until tomorrow am.  I will update all in the am.  Call me for questions.  #[phone]

## 2016-04-13 NOTE — PMR Pre-admission (Signed)
PMR Admission Coordinator Pre-Admission Assessment  Patient: [name] is an 75 y.o., male MRN: [id number] DOB: Mar 20, 1941 Height: _0  (180.3 cm) Weight: 99.7 kg (219 lb 14.4 oz)              Insurance Information HMO:    PPO:      PCP:      IPA:      80/20:      OTHER:  PRIMARY: [employer]      Policy#: [id number]      Subscriber: [place] Name: Sharyn Lull      Phone#: [phone]     Fax#: [phone] Pre-Cert#:                   Employer: Retired Benefits:  Phone #: [phone]     Name:  Candelaria Stagers. Date: 05/17/14     Deduct:  $0      Out of Pocket Max: $3400 (met $563.09)      Life Max: unlimited CIR: $225 days 1-6      SNF: $0 days 1-20; $150 days 21-100 Outpatient: medical necessity     Co-Pay: $15/visit Home Health: with authorization      Co-Pay: $25/visit DME: 80%     Co-Pay: 20% Providers: in network  Emergency Contact Information Contact Information    Name Relation Home Work Mylo Spouse [phone]  [phone]   Omri, Bertran [phone]       Current Medical History  Patient Admitting Diagnosis:  GBS  History of Present Illness: A 75 y.o.malewith history of HTN, CKD, recent diagnosis of PAF, CVA, crani for meningioma resection, recent admissions for urosepsis with urinary retention and left knee cellulitis, h/o GBS who was admitted on 04/04/16 with 2-3 day history of progressive BLE numbness with tingling and falls. MRI spine without evidence of significant stenosis. MRI brain with mildly enlarged right paracentral meningioma and stable left clinoid process meningioma. Neurology evaluated patient and exam consistent with significant BLE weakness with areflexia and BUE with hyporeflexia. He was started on IVIG on 11/19 without significant improvement. LP 11/22 with elevated levels of glucose and protein with cytoalbuminologic dissociation concerning for GBS and he was started on another round of IVIG. CSF  cultures with rare staph that was felt to be contaminant per Dr. Tommy Medal who recommended monitoring at this time. BLE edema treated with diuresis. PT ongoing revealing diffuse weakness.  With PT needed mod assist +2 for stand pivot transfers.  CIR recommended for follow up therapy.     Past Medical History  Past Medical History:  Diagnosis Date  . Atrial fibrillation ([place]) 02/2016   [place]  . Brain tumor (benign) ([place]) 1998   2 small tumors- since 2015 (Dr. [name] following)  . Carotid artery occlusion   . Cerebrovascular disease September 09, 2001   TIA, Left brain  . CKD (chronic kidney disease), stage III   . Diverticulosis   . ED (erectile dysfunction)   . Guillain-Barre syndrome ([place]) 1999  . Hx of elevated lipids   . Hyperlipidemia   . Hypertension   . Memory loss   . Stroke [facility]) 2003    Family History  family history includes Cancer in his mother; Colon cancer in his mother; Diabetes in his father; Stroke in his father.  Prior Rehab/Hospitalizations: Has recent HHPT for cellulitis through [facility].  Has the patient had major surgery during 100 days prior to admission? No  Current Medications   Current Facility-Administered  Medications:  .  acetaminophen (TYLENOL) tablet 650 mg, 650 mg, Oral, Q6H PRN, Lavina Hamman, MD, 650 mg at 04/12/16 1624 .  diltiazem (CARDIZEM CD) 24 hr capsule 120 mg, 120 mg, Oral, Daily, Geradine Girt, DO, 120 mg at 04/14/16 1112 .  diphenhydrAMINE (BENADRYL) capsule 25 mg, 25 mg, Oral, Daily PRN, Domenic Polite, MD, 25 mg at 04/12/16 1623 .  metoprolol (LOPRESSOR) tablet 50 mg, 50 mg, Oral, BID, Domenic Polite, MD, 50 mg at 04/14/16 1113 .  ondansetron (ZOFRAN) tablet 4 mg, 4 mg, Oral, Q6H PRN **OR** ondansetron (ZOFRAN) injection 4 mg, 4 mg, Intravenous, Q6H PRN, Geradine Girt, DO .  rosuvastatin (CRESTOR) tablet 40 mg, 40 mg, Oral, q1800, Tomi Bamberger Vann, DO, 40 mg at 04/13/16 1846 .  sodium chloride flush (NS) 0.9 % injection 3 mL, 3 mL,  Intravenous, Q12H, Jessica U Vann, DO, 3 mL at 04/14/16 1115 .  tamsulosin (FLOMAX) capsule 0.4 mg, 0.4 mg, Oral, QPC supper, Geradine Girt, DO, 0.4 mg at 04/13/16 1846 .  vitamin B-12 (CYANOCOBALAMIN) tablet 1,000 mcg, 1,000 mcg, Oral, Once per day on Wed Sat, Jessica U Vann, DO, 1,000 mcg at 04/14/16 0825 .  warfarin (COUMADIN) tablet 7.5 mg, 7.5 mg, Oral, ONCE-1800, Jake Church Masters, [facility] .  Warfarin - Pharmacist Dosing Inpatient, , Does not apply, q1800, Honor Loh, [facility]  Patients Current Diet: Diet Heart Room service appropriate? Yes; Fluid consistency: Thin Diet - low sodium heart healthy  Precautions / Restrictions Precautions Precautions: Fall Precaution Comments: Only recent h/o falls. Pt independent with steady gait prior to acute illness. Restrictions Weight Bearing Restrictions: No   Has the patient had 2 or more falls or a fall with injury in the past year?Yes.  Has suffered 5 falls this year with no injury.  Prior Activity Level Community (5-7x/wk): Went out daily, was driving.  Home Assistive Devices / Equipment Home Assistive Devices/Equipment: Eyeglasses, Reacher Home Equipment: Walker - 2 wheels, Bedside commode, Toilet riser  Prior Device Use: Indicate devices/aids used by the patient prior to current illness, exacerbation or injury? Walker.  Wife reports patient using walker most recently due to cellulitis.  Prior Functional Level Prior Function Level of Independence: Independent with assistive device(s) Comments: Pt had recent admission with Lt knee cellulitis and has been using RW since then. Receiving HHPT PTA  Self Care: Did the patient need help bathing, dressing, using the toilet or eating?  Independent  Indoor Mobility: Did the patient need assistance with walking from room to room (with or without device)? Independent  Stairs: Did the patient need assistance with internal or external stairs (with or without device)? Independent  Functional  Cognition: Did the patient need help planning regular tasks such as shopping or remembering to take medications? Independent  Current Functional Level Cognition  Overall Cognitive Status: Within Functional Limits for tasks assessed Orientation Level: Oriented X4    Extremity Assessment (includes Sensation/Coordination)  Upper Extremity Assessment: Overall WFL for tasks assessed RUE Deficits / Details: AROM WFL; strength 5/5 biceps, triceps, grip RUE Sensation: decreased light touch (along all 5 fingertips) LUE Deficits / Details: AROM WFL; strength 5/5 biceps, triceps, grip LUE Sensation: decreased light touch (along all 5 fingertips)  Lower Extremity Assessment: Defer to PT evaluation (reports tingling in LEs) RLE Deficits / Details: AAROM WFL; hip flexion 3+, hip extension 3+, knee extension 4, ankle DF 4, toe ext 3+ RLE Sensation: decreased light touch, decreased proprioception (circumferential below the knee "50% less") LLE Deficits /  Details: AAROM WFL; hip flexion 3, hip extension 3+, knee extension 3+, ankle DF 3+, toe ext 3+ LLE Sensation: decreased light touch, decreased proprioception (circumferential below the knee "50% less")    ADLs  Overall ADL's : Needs assistance/impaired Eating/Feeding: Independent, Bed level Grooming: Wash/dry hands, Wash/dry face, Oral care, Sitting, Set up Upper Body Bathing: Minimal assitance, Sitting Lower Body Bathing: Moderate assistance, Sit to/from stand Upper Body Dressing : Minimal assistance, Sitting Lower Body Dressing: Moderate assistance, Sit to/from stand Lower Body Dressing Details (indicate cue type and reason): pt able to don and doff socks with min assist Toilet Transfer: +2 for safety/equipment, Minimal assistance, Stand-pivot, RW Toileting- Clothing Manipulation and Hygiene: Total assistance, Sit to/from stand Toileting - Clothing Manipulation Details (indicate cue type and reason): unable to release walker in standing     Mobility  Overal bed mobility: Needs Assistance Bed Mobility: Supine to Sit Rolling: Supervision Sidelying to sit: Supervision Supine to sit: Min assist, HOB elevated Sit to supine: Supervision General bed mobility comments: Min A to elevate trunk    Transfers  Overall transfer level: Needs assistance Equipment used: Rolling walker (2 wheeled) Transfer via Lift Equipment: Stedy Transfers: Sit to/from Stand (From EOB and chair x 2.) Sit to Stand: Mod assist, +2 physical assistance Stand pivot transfers: Min assist, +2 safety/equipment General transfer comment: Mod A + 2 to power up from EOB and chair. Pt LOB x 1 and assistance needed to correct. Verbal cues for hand placement.    Ambulation / Gait / Stairs / Wheelchair Mobility  Ambulation/Gait Ambulation/Gait assistance: Mod assist, +2 physical assistance Ambulation Distance (Feet): 20 Feet (Additional trial of 20 after rest break.) Assistive device: Rolling walker (2 wheeled) Gait Pattern/deviations: Step-through pattern, Decreased step length - right, Decreased step length - left, Trunk flexed General Gait Details: Slow, labored gait; pt required rest break O2 saturation was 97%.  Verbal cues for upright posture and assistance needed to propel RW forward and while turning. Gait velocity interpretation: Below normal speed for age/gender    Posture / Balance Dynamic Sitting Balance Sitting balance - Comments: reaches 10 inches outside BOS diagonally fwd Lt and Rt Balance Overall balance assessment: Needs assistance Sitting-balance support: No upper extremity supported, Feet supported Sitting balance-Leahy Scale: Good Sitting balance - Comments: reaches 10 inches outside BOS diagonally fwd Lt and Rt Standing balance-Leahy Scale: Poor Standing balance comment: Needs continual UE support while standing and during gait.    Special needs/care consideration BiPAP/CPAP No CPM No Continuous Drip IV No Dialysis No       Life Vest  No Oxygen No Special Bed No Trach Size No Wound Vac (area) No     Skin Has thin skin, skin tears easily.                            Bowel mgmt: Last documented BM 04/13/16 Bladder mgmt: Foley catheter in place since 03/07/16 secondary to kidney illness.  Had urinary retention and had to have catheter reinserted. Diabetic mgmt No    Previous Home Environment Living Arrangements: Spouse/significant other Available Help at Discharge: Family, Available 24 hours/day Type of Home: House Home Layout: Two level, Able to live on main level with bedroom/bathroom Alternate Level Stairs-Number of Steps: flight Home Access: Stairs to enter Entrance Stairs-Number of Steps: 2 Bathroom Shower/Tub: Gaffer (on second floor) Biochemist, clinical: [place]: No  Discharge Living Setting Plans for Discharge Living Setting: Patient's home, [place],  Lives with (comment) (Lives with wife.  Has a son living beside them.) Type of Home at Discharge: House Discharge Home Layout: Two level, Bed/bath upstairs, Able to live on main level with bedroom/bathroom Alternate Level Stairs-Number of Steps: Flight Discharge Home Access: Stairs to enter Entrance Stairs-Number of Steps: 2 Does the patient have any problems obtaining your medications?: No  Social/Family/Support Systems Patient Roles: Spouse, Parent (Has a wife and a son.) Contact Information: Mehtaab Mayeda - spouse Anticipated Caregiver: wife Anticipated Caregiver's Contact Information: Curt Bears - wife (h) [phone] (c) [phone] Ability/Limitations of Caregiver: Wife can provide supervision. Caregiver Availability: 24/7 Discharge Plan Discussed with Primary Caregiver: Yes Is Caregiver In Agreement with Plan?: Yes Does Caregiver/Family have Issues with Lodging/Transportation while Pt is in Rehab?: No  Goals/Additional Needs Patient/Family Goal for Rehab: PT/OT supervision goals Expected length of stay: 14-17 days Cultural  Considerations: None Dietary Needs: Heart diet, thin liquids Equipment Needs: TBD Pt/Family Agrees to Admission and willing to participate: Yes Program Orientation Provided & Reviewed with Pt/Caregiver Including Roles  & Responsibilities: Yes  Decrease burden of Care through IP rehab admission: N/A  Possible need for SNF placement upon discharge: Not planned  Patient Condition: This patient's medical and functional status has changed since the consult dated: 04/12/16 in which the Rehabilitation Physician determined and documented that the patient's condition is appropriate for intensive rehabilitative care in an inpatient rehabilitation facility. See "History of Present Illness" (above) for medical update. Functional changes are:  Currently requiring mod assist for stand pivot transfers. Patient's medical and functional status update has been discussed with the Rehabilitation physician and patient remains appropriate for inpatient rehabilitation. Will admit to inpatient rehab today.  Preadmission Screen Completed By:  Retta Diones, 04/14/2016 3:46 PM ______________________________________________________________________   Discussed status with Dr. Posey Pronto on 04/14/16 at 1546 and received telephone approval for admission today.  Admission Coordinator:  Retta Diones, time1547/Date11/29/17

## 2016-04-13 NOTE — Evaluation (Signed)
Occupational Therapy Evaluation Patient Details Name: [name] MRN: [id number] DOB: 07-Feb-1941 Today's Date: 04/13/2016    History of Present Illness 75 y.o.malewho presents today with 2-3 day history of new onset bilateral lower extremity numbness and weakness. His PMHx includes prior CVA, left CEA, craniotomy for meningioma resection, GBS in 1999, HTN, HLD, CKD Stage 3 and recently diagnosed paroxysmal atrial fibrillation. He takes Coumadin at home.. Patient began having falls at home just prior to admission and fell 11/20 pm while hospitalized.   Clinical Impression   Pt was independent with AD just prior to admission. He presents with LE weakness and decreased standing balance. Two person assist was required for safety with stand pivot transfer to chair. Pt has excellent potential to return to supervision or modified independent level with intensive rehab. Will follow acutely.   Follow Up Recommendations  CIR;Supervision/Assistance - 24 hour    Equipment Recommendations       Recommendations for Other Services       Precautions / Restrictions Precautions Precautions: Fall Precaution Comments: Only recent h/o falls. Pt independent with steady gait prior to acute illness. Restrictions Weight Bearing Restrictions: No      Mobility Bed Mobility Overal bed mobility: Needs Assistance Bed Mobility: Supine to Sit     Supine to sit: Supervision     General bed mobility comments: no physical assist, used rail, HOB up  Transfers Overall transfer level: Needs assistance Equipment used: Rolling walker (2 wheeled) Transfers: Sit to/from [facility] Sit to Stand: Min assist;+2 safety/equipment Stand pivot transfers: Min assist;+2 safety/equipment       General transfer comment: Pivot steps with RW bed to recliner. No knee buckling noted.    Balance     Sitting balance-Leahy Scale: Good       Standing balance-Leahy Scale: Poor                               ADL Overall ADL's : Needs assistance/impaired Eating/Feeding: Independent;Bed level   Grooming: Wash/dry hands;Wash/dry face;Oral care;Sitting;Set up   Upper Body Bathing: Minimal assitance;Sitting   Lower Body Bathing: Moderate assistance;Sit to/from stand   Upper Body Dressing : Minimal assistance;Sitting   Lower Body Dressing: Moderate assistance;Sit to/from stand Lower Body Dressing Details (indicate cue type and reason): pt able to don and doff socks with min assist Toilet Transfer: +2 for safety/equipment;Minimal assistance;Stand-pivot;RW   Toileting- Clothing Manipulation and Hygiene: Total assistance;Sit to/from stand Toileting - Clothing Manipulation Details (indicate cue type and reason): unable to release walker in standing             Vision     Perception     Praxis      Pertinent Vitals/Pain Pain Assessment: No/denies pain     Hand Dominance Right   Extremity/Trunk Assessment Upper Extremity Assessment Upper Extremity Assessment: Overall WFL for tasks assessed   Lower Extremity Assessment Lower Extremity Assessment: Defer to PT evaluation (reports tingling in LEs)       Communication Communication Communication: No difficulties   Cognition Arousal/Alertness: Awake/alert Behavior During Therapy: WFL for tasks assessed/performed Overall Cognitive Status: Within Functional Limits for tasks assessed                     General Comments       Exercises       Shoulder Instructions      Home Living Family/patient expects to be discharged to:: Private  residence Living Arrangements: Spouse/significant other Available Help at Discharge: Family;Available 24 hours/day Type of Home: House Home Access: Stairs to enter [employer] of Steps: 2   Home Layout: Two level;Able to live on main level with bedroom/bathroom Alternate Level Stairs-Number of Steps: flight   Bathroom Shower/Tub: Walk-in  shower (on second floor)   Bathroom Toilet: Standard     Home Equipment: Environmental consultant - 2 wheels;Bedside commode;Toilet riser          Prior Functioning/Environment Level of Independence: Independent with assistive device(s)        Comments: Pt had recent admission with Lt knee cellulitis and has been using RW since then. Receiving HHPT PTA        OT Problem List: Decreased strength;Decreased activity tolerance;Impaired balance (sitting and/or standing);Decreased knowledge of use of DME or AE   OT Treatment/Interventions: Self-care/ADL training;DME and/or AE instruction;Visual/perceptual remediation/compensation;Patient/family education;Balance training;Therapeutic activities    OT Goals(Current goals can be found in the care plan section) Acute Rehab OT Goals Patient Stated Goal: to return to chopping wood OT Goal Formulation: With patient Time For Goal Achievement: 04/27/16 Potential to Achieve Goals: Good ADL Goals Pt Will Perform Grooming: with supervision;standing Pt Will Perform Upper Body Bathing: with set-up;sitting Pt Will Perform Lower Body Bathing: with supervision;sit to/from stand Pt Will Perform Upper Body Dressing: with set-up;sitting Pt Will Perform Lower Body Dressing: with supervision;sit to/from stand Pt Will Transfer to Toilet: with supervision;ambulating;bedside commode (over toilet) Pt Will Perform Toileting - Clothing Manipulation and hygiene: with supervision;sit to/from stand  OT Frequency: Min 2X/week   Barriers to D/C:            Co-evaluation              End of Session Equipment Utilized During Treatment: Gait belt;Rolling walker Nurse Communication: Mobility status  Activity Tolerance: Patient tolerated treatment well Patient left: in chair;with call bell/phone within reach;with chair alarm set   Time: 0832-0906 OT Time Calculation (min): 34 min Charges:  OT General Charges $OT Visit: 1 Procedure OT Evaluation $OT Eval Moderate  Complexity: 1 Procedure OT Treatments $Self Care/Home Management : 8-22 mins G-Codes:    Malka So 04/13/2016, 9:29 AM

## 2016-04-13 NOTE — Progress Notes (Signed)
[facility] Progress Note  Patient: [name] XHB:[id number]   PCP: Irven Shelling, MD DOB: 08/01/1940   DOA: 04/04/2016   DOS: 04/13/2016   Date of Service: the patient was seen and examined on 04/13/2016  Brief hospital course: Pt. with PMH of CKD, CVA, history of GBS, A. fib on chronic anti-coagulation, meningioma, HTN, HLD; admitted on 04/04/2016, with complaint of recurrent fall, was found to have generalized weakness versus suspected GBS. NEuro following,S/P IVIG infusion, getting better awaiting CIR.  Assessment and Plan: 1. Suspected GBS. S/P IVIG -remote h/o GBS 19 years ago, ? recurrence -MRI brain -chronic occlusion of the right ICA and chronic left hemispheric and to follow malacia underlying craniotomy site, It also shows mildly enlarged meningioma. MRI cervical, thoracic and lumbar spine also unremarkable. -Neurology following -LP 11/22 with Protein of 111 and 0 WBCs-d/w Neurology, concerning for GBS, started IVIG 11/23, completed -Spinal fluid with rare Coag negative staph in spinal fluid Cx, with no s/s of meningitis , 0 WBC in spinal fluid, do not think he has meningitis, d/w ID Dr.Van [place] who agrees that this could be a contaminant and recommends to monitor for now -PT consulting, CIR recommended  2. A. fib. -warfarin resumed post LP -continue metoprolol,Cardizem.  3. Chronic kidney disease. Stage III -stable. -PO lasix due to swelling from IVIG  4. Subacute urinary retention. -remains with foley for last 3weeks since last discharge, Seen at [facility] 2 weeks ago, failed Voiding trial and foley was re-inserted -continue Flomax  DVT Prophylaxis: warfarin, add lovenox as INR low Advance goals of care discussion: Full code Family Communication: no family at bedside Disposition: needs rehab, ? CIR  Consultants: Neurology Procedures: None  Antibiotics: Anti-infectives    None      Subjective: Denies any acute complaint. No nausea no  vomiting. Weakness is improving.  Objective: Physical Exam: Vitals:   04/13/16 0535 04/13/16 0951 04/13/16 1523 04/13/16 1759  BP: (!) 124/52 (!) 131/49 (!) 147/55 (!) 147/56  Pulse: (!) 51 (!) 58 (!) 58 60  Resp: 16 20 19 20   Temp: 97.7 F (36.5 C) 98 F (36.7 C) 98.8 F (37.1 C) 98.2 F (36.8 C)  TempSrc: Oral Oral Oral Oral  SpO2: 93% 94% 98% 99%  Weight:      Height:        Intake/Output Summary (Last 24 hours) at 04/13/16 1843 Last data filed at 04/13/16 0536  Gross per 24 hour  Intake                0 ml  Output             1700 ml  Net            -1700 ml   Filed Weights   04/10/16 0546 04/11/16 0537 04/13/16 0500  Weight: 99.9 kg (220 lb 4.8 oz) 99 kg (218 lb 4.1 oz) 97.1 kg (214 lb)    General: Alert, Awake and Oriented to Time, Place and Person. Appear in mild distress, affect appropriate Eyes: PERRL, Conjunctiva normal ENT: Oral Mucosa clear moist. Neck: difficult to assess JVD, no Abnormal Mass Or lumps Cardiovascular: S1 and S2 Present, no Murmur, Respiratory: Bilateral Air entry equal and Decreased, no use of accessory muscle, Clear to Auscultation, no Crackles, no wheezes Abdomen: Bowel Sound present, Soft and no tenderness Skin: no redness, no Rash, no induration Extremities: trace Pedal edema, no calf tenderness Neurologic: 4/5  bilateral leg weakness, decreased light tough knee down,  Data Reviewed: CBC:  Recent Labs Lab 04/07/16 0914 04/09/16 0244 04/10/16 0250 04/11/16 0229 04/12/16 0655 04/13/16 0305  WBC 15.0* 13.0* 12.5* 9.1 9.9 9.2  NEUTROABS 11.8*  --   --   --   --  6.3  HGB 12.1* 12.6* 12.5* 12.3* 12.5* 12.1*  HCT 38.9* 39.4 39.2 38.1* 39.1 38.1*  MCV 89.6 88.3 88.3 87.8 88.1 88.2  PLT 226 234 240 231 217 321   Basic Metabolic Panel:  Recent Labs Lab 04/07/16 0914 04/09/16 0017 04/10/16 0250 04/13/16 0305  NA 138 137 137 136  K 3.9 4.1 3.9 3.6  CL 107 105 102 107  CO2 24 24 26 24   GLUCOSE 166* 120* 118* 114*  BUN 10  16 22* 20  CREATININE 1.47* 1.49* 1.53* 1.37*  CALCIUM 8.7* 8.8* 9.0 8.6*  MG 1.9  --   --  2.0    Liver Function Tests:  Recent Labs Lab 04/07/16 0914 04/13/16 0305  AST 19 23  ALT 13* 20  ALKPHOS 76 79  BILITOT 0.6 0.2*  PROT 5.7* 7.8  ALBUMIN 2.6* 2.2*   No results for input(s): LIPASE, AMYLASE in the last 168 hours. No results for input(s): AMMONIA in the last 168 hours. Coagulation Profile:  Recent Labs Lab 04/09/16 0244 04/10/16 0250 04/11/16 0229 04/12/16 0655 04/13/16 0305  INR 1.21 1.23 1.37 1.66 2.00   Cardiac Enzymes: No results for input(s): CKTOTAL, CKMB, CKMBINDEX, TROPONINI in the last 168 hours. BNP (last 3 results) No results for input(s): PROBNP in the last 8760 hours.  CBG:  Recent Labs Lab 04/09/16 0630  GLUCAP 125*    Studies: No results found.   Scheduled Meds: . diltiazem  120 mg Oral Daily  . metoprolol tartrate  50 mg Oral BID  . rosuvastatin  40 mg Oral q1800  . sodium chloride flush  3 mL Intravenous Q12H  . tamsulosin  0.4 mg Oral QPC supper  . vitamin B-12  1,000 mcg Oral Once per day on Wed Sat  . warfarin  7.5 mg Oral ONCE-1800  . Warfarin - Pharmacist Dosing Inpatient   Does not apply q1800   Continuous Infusions: PRN Meds: acetaminophen, diphenhydrAMINE, ondansetron **OR** ondansetron (ZOFRAN) IV  Time spent: 30 minutes  Author: Author: Berle Mull, MD [facility] 04/13/2016 6:43 PM   If 7PM-7AM, please contact night-coverage at [email or web address], password [facility]

## 2016-04-13 NOTE — Progress Notes (Signed)
RT NOTE:  VC: 2.3L NIF: greater than -60  Great patient effort.

## 2016-04-13 NOTE — Progress Notes (Signed)
Neurology Progress Note  Subjective: Chart reviewed. He reports that he is sleepy this morning. He slept well overnight. He is eating without difficultly. He wants to know when he Foley might come out because he is tired of having this. He denies any pain or discomfort anywhere apart from some tingling on the sole of the L foot. He has no muscle cramps. He feels like the strength and sensation in his legs has improved. He was put in a chair yesterday and sat up for 3-4 hours before getting tired and going back to the bed. No new neurologic complaints this morning.   Current Meds:   Current Facility-Administered Medications:  .  acetaminophen (TYLENOL) tablet 650 mg, 650 mg, Oral, Q6H PRN, Lavina Hamman, MD, 650 mg at 04/12/16 1624 .  diltiazem (CARDIZEM CD) 24 hr capsule 120 mg, 120 mg, Oral, Daily, Geradine Girt, DO, 120 mg at 04/12/16 0954 .  diphenhydrAMINE (BENADRYL) capsule 25 mg, 25 mg, Oral, Daily PRN, Domenic Polite, MD, 25 mg at 04/12/16 1623 .  enoxaparin (LOVENOX) injection 40 mg, 40 mg, Subcutaneous, Q24H, Domenic Polite, MD, 40 mg at 04/12/16 0954 .  metoprolol (LOPRESSOR) tablet 50 mg, 50 mg, Oral, BID, Domenic Polite, MD, 50 mg at 04/12/16 0954 .  ondansetron (ZOFRAN) tablet 4 mg, 4 mg, Oral, Q6H PRN **OR** ondansetron (ZOFRAN) injection 4 mg, 4 mg, Intravenous, Q6H PRN, Geradine Girt, DO .  rosuvastatin (CRESTOR) tablet 40 mg, 40 mg, Oral, q1800, Geradine Girt, DO, 40 mg at 04/12/16 1720 .  sodium chloride flush (NS) 0.9 % injection 3 mL, 3 mL, Intravenous, Q12H, Jessica U Vann, DO, 3 mL at 04/12/16 2125 .  tamsulosin (FLOMAX) capsule 0.4 mg, 0.4 mg, Oral, QPC supper, Geradine Girt, DO, 0.4 mg at 04/12/16 1720 .  vitamin B-12 (CYANOCOBALAMIN) tablet 1,000 mcg, 1,000 mcg, Oral, Once per day on Wed Sat, Jessica U Vann, DO, 1,000 mcg at 04/10/16 6629 .  Warfarin - Pharmacist Dosing Inpatient, , Does not apply, q1800, Honor Loh, RPH  Objective:  Temp:  [97.7 F (36.5  C)-98.2 F (36.8 C)] 97.7 F (36.5 C) (11/28 0535) Pulse Rate:  [51-62] 51 (11/28 0535) Resp:  [16-20] 16 (11/28 0535) BP: (122-144)/(50-68) 124/52 (11/28 0535) SpO2:  [93 %-98 %] 93 % (11/28 0535) Weight:  [97.1 kg (214 lb)] 97.1 kg (214 lb) (11/28 0500)  General: WDWN in NAD. Alert, oriented x4. Speech is clear without dysarthria. Affect is bright. Comportment is normal.  HEENT: Neck is supple without lymphadenopathy. Mucous membranes are moist and the oropharynx is clear. Sclerae are anicteric. There is no conjunctival injection.  CV: Regular, no murmur. Carotid pulses are 2+ and symmetric with no bruits. Distal pulses 2+ and symmetric.  Lungs: CTAB  Extremities: No cyanosis. Mild edema B ankles, L>R. Neuro: MS: As noted above. No aphasia.  CN: Pupils are equal and reactive from 3-->2 mm bilaterally. Visual fields are full to confrontation. EOMI with some breakup of smooth pursuits, no nystagmus. Facial sensation is intact to light touch. Face is symmetric at rest with normal strength and mobility. He appears to have some intermittent L facial spasm. Hearing is intact to conversational voice. Voice is normal in tone and quality. Palate elevates symmetrically. Uvula is midline. Bilateral SCM and trapezii are 5/5. Tongue is midline with normal bulk and mobility.  Motor: Normal bulk, tone. Strength is normal with the exception of 4+/5 L KF/KE and B DF; 4/5 B PF. No pronator drift. He has  a mild intention tremor in BUE, L>R.  Sensation: Light touch and pinprick, vibration are decreased in BLE to the level of the knees. Subjectively, however, he thinks sensation is better today.  DTRs: Trace in [place], absent in [place]. Toes are downgoing bilaterally. No pathological reflexes.  Coordination: Finger-to-nose and heel-to-shin are without dysmetria bilaterally.    Labs: Lab Results  Component Value Date   WBC 9.2 04/13/2016   HGB 12.1 (L) 04/13/2016   HCT 38.1 (L) 04/13/2016   PLT 218 04/13/2016    GLUCOSE 114 (H) 04/13/2016   ALT 20 04/13/2016   AST 23 04/13/2016   NA 136 04/13/2016   K 3.6 04/13/2016   CL 107 04/13/2016   CREATININE 1.37 (H) 04/13/2016   BUN 20 04/13/2016   CO2 24 04/13/2016   TSH 4.274 04/04/2016   INR 2.00 04/13/2016   HGBA1C 6.2 (H) 03/07/2016   CBC Latest Ref Rng & Units 04/13/2016 04/12/2016 04/11/2016  WBC 4.0 - 10.5 K/uL 9.2 9.9 9.1  Hemoglobin 13.0 - 17.0 g/dL 12.1(L) 12.5(L) 12.3(L)  Hematocrit 39.0 - 52.0 % 38.1(L) 39.1 38.1(L)  Platelets 150 - 400 K/uL 218 217 231    Lab Results  Component Value Date   HGBA1C 6.2 (H) 03/07/2016   Lab Results  Component Value Date   ALT 20 04/13/2016   AST 23 04/13/2016   ALKPHOS 79 04/13/2016   BILITOT 0.2 (L) 04/13/2016   Mg 2.0  Radiology: There is no new neuroimaging for review.   A/P:   1. AIDP (Guillain-Barre Syndrome): Presentation and LP compatible with AIDP. He was recently treated for urosepsis and LLE cellulitis but no reported URI, GI illness, or vaccination prior to onset of LE symptoms. He has completed five days of IVIg as of 11/27 with improvement in his symptoms and no complications from therapy. Management from this point is supportive.   2. BLE weakness: This is acute, due to AIDP. He continues to have distal weakness in BLE but this appears to be steadily improving. PT, rehab.  3. BLE sensory loss: This is acute, due to AIDP. He has a mild stocking distribution sensory loss in BLE. PT, rehab.   4. Abnormality of gait: This is acute, due to AIDP with resultant weakness and numbness in the LEs. PT, rehab.   This was discussed with the patient and he is in agreement with the plan as noted. He was given the opportunity to ask any questions and these were addressed to his satisfaction. He is hoping to go to rehab soon.   Thank you for the opportunity to participate in his care. I will continue to follow with you. Please call with any questions or concerns.   Melba Coon, MD [facility]

## 2016-04-13 NOTE — Progress Notes (Signed)
[place] for warfarin Indication: atrial fibrillation and CVA  Allergies  Allergen Reactions  . Asa [Aspirin] Other (See Comments)    Severe skin bruising   . Immune Globulins Other (See Comments)    Tetanus Immunes Globulins    (  Pt. Cannot take the Flu shot ) [name]     Patient Measurements: Height: 5\' 11"  (180.3 cm) Weight: 214 lb (97.1 kg) IBW/kg (Calculated) : 75.3   Vital Signs: Temp: 98 F (36.7 C) (11/28 0951) Temp Source: Oral (11/28 0951) BP: 131/49 (11/28 0951) Pulse Rate: 58 (11/28 0951)  Labs:  Recent Labs  04/11/16 0229 04/12/16 0655 04/13/16 0305  HGB 12.3* 12.5* 12.1*  HCT 38.1* 39.1 38.1*  PLT 231 217 218  LABPROT 17.0* 19.8* 23.0*  INR 1.37 1.66 2.00  CREATININE  --   --  1.37*    Estimated Creatinine Clearance: 55.4 mL/min (by C-G formula based on SCr of 1.37 mg/dL (H)).   Assessment: 75 yo M on warfarin PTA for CVA and atrial fibrillation presents to ED with leg weakness with concern for Guillian-Barr syndrome (patient has history). Warfarin initially held due to need for LP on 11/22. Warfarin resumed 11/23.   INR 2, CBC stable.   PTA dose: warfarin 5 mg po daily   Goal of Therapy:  INR 2-3 Monitor platelets by anticoagulation protocol: Yes    Plan:  Warfarin 7.5 mg PO tonight  INR daily Monitor for s/sx bleeding  Lovenox dc'd due to therapeutic INR    Hughes Better, PharmD, BCPS Clinical Pharmacist 04/13/2016 1:09 PM

## 2016-04-14 ENCOUNTER — Inpatient Hospital Stay (HOSPITAL_COMMUNITY)
Admission: RE | Admit: 2016-04-14 | Discharge: 2016-04-28 | DRG: 096 | Disposition: A | Discharge status: Home Health | Payer: PPO | Source: Intra-hospital | Attending: Physical Medicine & Rehabilitation | Admitting: Physical Medicine & Rehabilitation

## 2016-04-14 DIAGNOSIS — D72829 Elevated white blood cell count, unspecified: Secondary | ICD-10-CM

## 2016-04-14 DIAGNOSIS — D329 Benign neoplasm of meninges, unspecified: Secondary | ICD-10-CM | POA: Diagnosis not present

## 2016-04-14 DIAGNOSIS — Z79899 Other long term (current) drug therapy: Secondary | ICD-10-CM

## 2016-04-14 DIAGNOSIS — R338 Other retention of urine: Secondary | ICD-10-CM

## 2016-04-14 DIAGNOSIS — N319 Neuromuscular dysfunction of bladder, unspecified: Secondary | ICD-10-CM | POA: Diagnosis not present

## 2016-04-14 DIAGNOSIS — G610 Guillain-Barre syndrome: Secondary | ICD-10-CM | POA: Diagnosis not present

## 2016-04-14 DIAGNOSIS — I129 Hypertensive chronic kidney disease with stage 1 through stage 4 chronic kidney disease, or unspecified chronic kidney disease: Secondary | ICD-10-CM

## 2016-04-14 DIAGNOSIS — R29898 Other symptoms and signs involving the musculoskeletal system: Secondary | ICD-10-CM | POA: Diagnosis not present

## 2016-04-14 DIAGNOSIS — I4891 Unspecified atrial fibrillation: Secondary | ICD-10-CM | POA: Diagnosis not present

## 2016-04-14 DIAGNOSIS — I951 Orthostatic hypotension: Secondary | ICD-10-CM | POA: Diagnosis not present

## 2016-04-14 DIAGNOSIS — Z7901 Long term (current) use of anticoagulants: Secondary | ICD-10-CM

## 2016-04-14 DIAGNOSIS — N183 Chronic kidney disease, stage 3 unspecified: Secondary | ICD-10-CM | POA: Diagnosis present

## 2016-04-14 DIAGNOSIS — E785 Hyperlipidemia, unspecified: Secondary | ICD-10-CM | POA: Diagnosis not present

## 2016-04-14 DIAGNOSIS — R339 Retention of urine, unspecified: Secondary | ICD-10-CM | POA: Diagnosis not present

## 2016-04-14 DIAGNOSIS — I482 Chronic atrial fibrillation: Secondary | ICD-10-CM | POA: Diagnosis not present

## 2016-04-14 DIAGNOSIS — I4821 Permanent atrial fibrillation: Secondary | ICD-10-CM | POA: Diagnosis present

## 2016-04-14 DIAGNOSIS — Z87891 Personal history of nicotine dependence: Secondary | ICD-10-CM

## 2016-04-14 DIAGNOSIS — I480 Paroxysmal atrial fibrillation: Secondary | ICD-10-CM | POA: Diagnosis not present

## 2016-04-14 DIAGNOSIS — Z8673 Personal history of transient ischemic attack (TIA), and cerebral infarction without residual deficits: Secondary | ICD-10-CM | POA: Diagnosis not present

## 2016-04-14 DIAGNOSIS — M792 Neuralgia and neuritis, unspecified: Secondary | ICD-10-CM

## 2016-04-14 DIAGNOSIS — I10 Essential (primary) hypertension: Secondary | ICD-10-CM | POA: Diagnosis present

## 2016-04-14 LAB — PROTIME-INR
INR: 2.28
Prothrombin Time: 25.5 seconds — ABNORMAL HIGH (ref 11.4–15.2)

## 2016-04-14 LAB — CBC
HCT: 38.8 % — ABNORMAL LOW (ref 39.0–52.0)
Hemoglobin: 12.4 g/dL — ABNORMAL LOW (ref 13.0–17.0)
MCH: 28.1 pg (ref 26.0–34.0)
MCHC: 32 g/dL (ref 30.0–36.0)
MCV: 88 fL (ref 78.0–100.0)
PLATELETS: 217 10*3/uL (ref 150–400)
RBC: 4.41 MIL/uL (ref 4.22–5.81)
RDW: 13.7 % (ref 11.5–15.5)
WBC: 11.5 10*3/uL — AB (ref 4.0–10.5)

## 2016-04-14 MED ORDER — BISACODYL 10 MG RE SUPP: 10.0000 mg | Freq: Every day | RECTAL | Status: DC | PRN

## 2016-04-14 MED ORDER — ACETAMINOPHEN 325 MG PO TABS: 650.0000 mg | ORAL_TABLET | Freq: Four times a day (QID) | ORAL | Status: DC | PRN

## 2016-04-14 MED ORDER — METOPROLOL TARTRATE 50 MG PO TABS
50.0000 mg | ORAL_TABLET | Freq: Two times a day (BID) | ORAL | Status: DC
  Administered 2016-04-14 – 2016-04-24 (×18): 50 mg via ORAL
  Filled 2016-04-14 (×22): qty 1

## 2016-04-14 MED ORDER — ACETAMINOPHEN 325 MG PO TABS: 325.0000 mg | ORAL_TABLET | ORAL | Status: DC | PRN

## 2016-04-14 MED ORDER — ONDANSETRON HCL 4 MG/2ML IJ SOLN: 4.0000 mg | Freq: Four times a day (QID) | INTRAMUSCULAR | Status: DC | PRN

## 2016-04-14 MED ORDER — GUAIFENESIN-DM 100-10 MG/5ML PO SYRP
5.0000 mL | ORAL_SOLUTION | Freq: Four times a day (QID) | ORAL | Status: DC | PRN
Start: 2016-04-14 — End: 2016-04-28

## 2016-04-14 MED ORDER — PROCHLORPERAZINE MALEATE 5 MG PO TABS: 5.0000 mg | ORAL_TABLET | Freq: Four times a day (QID) | ORAL | Status: DC | PRN

## 2016-04-14 MED ORDER — FLEET ENEMA 7-19 GM/118ML RE ENEM
1.0000 | ENEMA | Freq: Once | RECTAL | Status: DC | PRN
Start: 2016-04-14 — End: 2016-04-28

## 2016-04-14 MED ORDER — VITAMIN B-12 1000 MCG PO TABS
1000.0000 ug | ORAL_TABLET | ORAL | Status: DC
  Administered 2016-04-17 – 2016-04-26 (×4): 1000 ug via ORAL
  Filled 2016-04-14 (×5): qty 1

## 2016-04-14 MED ORDER — TAMSULOSIN HCL 0.4 MG PO CAPS
0.4000 mg | ORAL_CAPSULE | Freq: Every day | ORAL | Status: DC
  Administered 2016-04-15 – 2016-04-27 (×13): 0.4 mg via ORAL
  Filled 2016-04-14 (×13): qty 1

## 2016-04-14 MED ORDER — ROSUVASTATIN CALCIUM 20 MG PO TABS
40.0000 mg | ORAL_TABLET | Freq: Every day | ORAL | Status: DC
  Administered 2016-04-15 – 2016-04-27 (×13): 40 mg via ORAL
  Filled 2016-04-14 (×14): qty 2

## 2016-04-14 MED ORDER — PROCHLORPERAZINE 25 MG RE SUPP: 12.5000 mg | Freq: Four times a day (QID) | RECTAL | Status: DC | PRN

## 2016-04-14 MED ORDER — WARFARIN SODIUM 7.5 MG PO TABS
7.5000 mg | ORAL_TABLET | Freq: Once | ORAL | Status: DC
  Administered 2016-04-14: 7.5 mg via ORAL
  Filled 2016-04-14: qty 1

## 2016-04-14 MED ORDER — ALUM & MAG HYDROXIDE-SIMETH 200-200-20 MG/5ML PO SUSP: 30.0000 mL | ORAL | Status: DC | PRN

## 2016-04-14 MED ORDER — ONDANSETRON HCL 4 MG PO TABS: 4.0000 mg | ORAL_TABLET | Freq: Four times a day (QID) | ORAL | Status: DC | PRN

## 2016-04-14 MED ORDER — TRAZODONE HCL 50 MG PO TABS: 25.0000 mg | ORAL_TABLET | Freq: Every evening | ORAL | Status: DC | PRN

## 2016-04-14 MED ORDER — PROCHLORPERAZINE EDISYLATE 5 MG/ML IJ SOLN
5.0000 mg | Freq: Four times a day (QID) | INTRAMUSCULAR | Status: DC | PRN
Start: 2016-04-14 — End: 2016-04-28

## 2016-04-14 MED ORDER — GABAPENTIN 100 MG PO CAPS
100.0000 mg | ORAL_CAPSULE | Freq: Every day | ORAL | Status: DC
  Administered 2016-04-14 – 2016-04-27 (×14): 100 mg via ORAL
  Filled 2016-04-14 (×14): qty 1

## 2016-04-14 MED ORDER — WARFARIN SODIUM 7.5 MG PO TABS: 7.5000 mg | ORAL_TABLET | Freq: Once | ORAL | Status: DC

## 2016-04-14 MED ORDER — VITAMIN D 1000 UNITS PO TABS
1000.0000 [IU] | ORAL_TABLET | Freq: Every day | ORAL | Status: DC
  Administered 2016-04-15 – 2016-04-28 (×14): 1000 [IU] via ORAL
  Filled 2016-04-14 (×14): qty 1

## 2016-04-14 MED ORDER — DIPHENHYDRAMINE HCL 12.5 MG/5ML PO ELIX
12.5000 mg | ORAL_SOLUTION | Freq: Four times a day (QID) | ORAL | Status: DC | PRN
Start: 2016-04-14 — End: 2016-04-25

## 2016-04-14 MED ORDER — DILTIAZEM HCL ER COATED BEADS 120 MG PO CP24
120.0000 mg | ORAL_CAPSULE | Freq: Every day | ORAL | Status: DC
  Administered 2016-04-15 – 2016-04-28 (×13): 120 mg via ORAL
  Filled 2016-04-14 (×14): qty 1

## 2016-04-14 NOTE — Progress Notes (Signed)
RT note:   NIF: -52 cmh2o VC: 1.02l  Good effort.  Patient in no distress at this time.

## 2016-04-14 NOTE — Progress Notes (Signed)
[place] [facility] for warfarin Indication: atrial fibrillation and CVA  Allergies  Allergen Reactions  . Asa [Aspirin] Other (See Comments)    Severe skin bruising   . Immune Globulins Other (See Comments)    Tetanus Immunes Globulins    (  Pt. Cannot take the Flu shot ) [name]     Patient Measurements: Height: 5\' 11"  (180.3 cm) Weight: 219 lb 14.4 oz (99.7 kg) IBW/kg (Calculated) : 75.3   Vital Signs: Temp: 97.6 F (36.4 C) (11/29 1016) Temp Source: Oral (11/29 1016) BP: 141/58 (11/29 1016) Pulse Rate: 62 (11/29 1016)  Labs:  Recent Labs  04/12/16 0655 04/13/16 0305 04/14/16 0359  HGB 12.5* 12.1* 12.4*  HCT 39.1 38.1* 38.8*  PLT 217 218 217  LABPROT 19.8* 23.0* 25.5*  INR 1.66 2.00 2.28  CREATININE  --  1.37*  --     Estimated Creatinine Clearance: 56.1 mL/min (by C-G formula based on SCr of 1.37 mg/dL (H)).   Assessment: 75 yo M on warfarin PTA for CVA and atrial fibrillation presents to ED with leg weakness with concern for Guillian-Barr syndrome (patient has history). Warfarin initially held due to need for LP on 11/22. Warfarin resumed 11/23. INR 2.2, CBC stable. Pt requiring a higher dose than normal prior to admission dose.  PTA dose: warfarin 5 mg po daily   Goal of Therapy:  INR 2-3 Monitor platelets by anticoagulation protocol: Yes    Plan:  Warfarin 7.5 mg po x1 INR daily    Hughes Better, PharmD, BCPS Clinical Pharmacist 04/14/2016 11:11 AM

## 2016-04-14 NOTE — Clinical Social Work Note (Signed)
Pt is ready for discharge today and will admit to CIR as insurance has approved. CSW updated facility of pt's discharge plan as CSW spoke to pt about the possibility of going to [facility]. CSW is signing off as no further needs identified.   Darden Dates, MSW, LCSW  Clinical Social Worker  [phone]

## 2016-04-14 NOTE — Progress Notes (Signed)
Rehab admissions - I continue to await call back from insurance carrier regarding possible inpatient rehab admission for today.  I will update all once I hear back from insurance carrier.  Call me for questions.  #[phone]

## 2016-04-14 NOTE — Discharge Summary (Signed)
PATIENT DETAILS Name: [name] Age: 75 y.o. Sex: male Date of Birth: [birth year] MRN: [id number]. Admitting Physician: Geradine Girt, DO WUJ:[id number],WGNF Broadus John, MD  Admit Date: 04/04/2016 Discharge date: 04/14/2016  Recommendations for Outpatient Follow-up:  1. Follow up with PCP in 1-2 weeks 2. Patient on Coumadin, will require periodic INR checks. 3. Please obtain BMP/CBC in one week 4. Please ensure follow up with [facility]  Admitted From:  Home  Disposition: [place]: No  Equipment/Devices: None  Discharge Condition: Stable  CODE STATUS: FULL CODE  Diet recommendation:  Heart Healthy   Brief Summary: See H&P, Labs, Consult and Test reports for all details in brief, Pt. with PMH of CKD, CVA, history of GBS, A. fib on chronic anti-coagulation, meningioma, HTN, HLD; admitted on 04/04/2016, with complaint of recurrent fall, was found to have generalized weakness versus suspected GBS. Patient was seen by neurology, started on IVIG and significantly improved. Tentative plans are to discharge to CIR once bed is available.  Brief Hospital Course: Suspected GBS: Seen by neurology, clinical symptomatology thought to be consistent with demyelinating disorder-suspected Guillain-Barr syndrome. Underwent IVIG treatment with significant improvement. Note, LP on 11/22 showed an elevated protein of 111 with 0 WBCs, although CSF culture showed quiet place negative Staphylococcus, patient clinical symptomatology is not consistent with meningitis-Dr. Broadus John discussed with infectious disease-it is currently suspected to be a contaminant. Patient has been monitored off antibiotics and is actually improving. Plans are to discharge to CIR once bed is available. Note MRI of the entire spine and did not show any acute abnormalities, MRI of the brain also did not show any acute abnormalities.  Subacute urinary retention:Remains with foley for last 3weeks since last  discharge, Seen at [facility] 2 weeks ago, failed Voiding trial and foley was re-inserted.Continue Flomax. Please ensure follow up with [facility]  Paroxysmal A. Fib.: Rate controlled with metoprolol and Cardizem, chads 2 vasc for at least 3. On Coumadin, with therapeutic INR. Continue to monitor INR in the outpatient setting  Chronic kidney disease Stage III: Written in stable and close to usual baseline. Tinea oral Lasix. -stable.  Subacute urinary retention:remains with foley for last 3weeks since last discharge, Seen at [facility] 2 weeks ago, failed Voiding trial and foley was re-inserted -continue Flomax  History of Meningioma: Continue outpatient follow-up with neurosurgery  Procedures/Studies: Echo  Discharge Diagnoses:  Active Problems:   Renal insufficiency   History of CVA (cerebrovascular accident)   Acute urinary retention   Leg weakness   Guillain Barr syndrome (HCC)   Weakness of both lower extremities   Chronic anticoagulation   Benign essential HTN   Stage 3 chronic kidney disease   PAF (paroxysmal atrial fibrillation) (HCC)   History of meningioma   Urinary retention   History of Guillain-Barre syndrome   Discharge Instructions:  Activity:  As tolerated with Full fall precautions use walker/cane & assistance as needed   Discharge Instructions    Call MD for:  extreme fatigue    Complete by:  As directed    Call MD for:  severe uncontrolled pain    Complete by:  As directed    Diet - low sodium heart healthy    Complete by:  As directed    Increase activity slowly    Complete by:  As directed        Medication List    TAKE these medications   acetaminophen 500 MG tablet Commonly known as:  TYLENOL Take  1,000 mg by mouth every 6 (six) hours as needed for headache (pain).   cholecalciferol 1000 units tablet Commonly known as:  VITAMIN D Take 1,000 Units by mouth daily.   diltiazem 120 MG 24 hr capsule Commonly known as:  CARDIZEM  CD Take 1 capsule (120 mg total) by mouth daily.   furosemide 80 MG tablet Commonly known as:  LASIX Take 40-80 mg by mouth daily as needed for fluid or edema (weight gain of 2 lbs in 24 hours or 5 lbs in a week).   metoprolol 50 MG tablet Commonly known as:  LOPRESSOR Take 1 tablet (50 mg total) by mouth 2 (two) times daily.   rosuvastatin 40 MG tablet Commonly known as:  CRESTOR Take 40 mg by mouth daily.   tamsulosin 0.4 MG Caps capsule Commonly known as:  FLOMAX Take 1 capsule (0.4 mg total) by mouth daily after supper.   vitamin B-12 1000 MCG tablet Commonly known as:  CYANOCOBALAMIN Take 1,000 mcg by mouth 2 (two) times a week. Wednesday and Saturday   warfarin 5 MG tablet Commonly known as:  COUMADIN Take 1 tablet (5 mg total) by mouth daily at 6 PM.      Follow-up Information    Irven Shelling, MD. Schedule an appointment as soon as possible for a visit in 2 week(s).   Specialty:  Internal Medicine Contact information: [address] [place] [address] [phone]          Allergies  Allergen Reactions  . Asa [Aspirin] Other (See Comments)    Severe skin bruising   . Immune Globulins Other (See Comments)    Tetanus Immunes Globulins    (  Pt. Cannot take the Flu shot ) [name]     Consultations:   neurology   Other Procedures/Studies: Ct Head Wo Contrast  Result Date: 04/05/2016 CLINICAL DATA:  Falls, bilateral lower extremity numbness and weakness. EXAM: CT HEAD WITHOUT CONTRAST TECHNIQUE: Contiguous axial images were obtained from the base of the skull through the vertex without intravenous contrast. COMPARISON:  MR brain 10/29/2015. FINDINGS: Brain: No evidence of an acute infarct, acute hemorrhage or hydrocephalus. A 3.5 cm hyper attenuating mass along the inner table of the high right parietal bone is unchanged. Adjacent low-attenuation/edema in the right parietal deep white matter may be slightly increased from  10/29/2015. A smaller hyperattenuating lesion along the floor of the left anterior cranial fossa (series 201, image 10) was better seen on 10/29/2015. Mild atrophy. Vascular: No hyperdense vessel or unexpected calcification. Skull: Left frontoparietal craniectomy. Sinuses/Orbits: Tiny right mastoid effusion. Other: None. IMPRESSION: 1. 3.5 cm meningioma along the right parietal vertex with suspected increase in the extent of edema in the adjacent right parietal deep white matter. 2. No evidence of an acute infarct. 3. Left MCA territory encephalomalacia. 4. Small meningioma along the floor of the left anterior cranial fossa, better seen on 10/29/2015. Electronically Signed   By: Lorin Picket M.D.   On: 04/05/2016 11:55   Mr Jeri Cos UV Contrast  Result Date: 04/05/2016 CLINICAL DATA:  75 year old male with 2 intracranial meningiomas under surveillance by neurosurgery. Recent hospitalization for UTI and left lower extremity infection. New lower extremity weakness. Initial encounter. EXAM: MRI HEAD WITHOUT AND WITH CONTRAST TECHNIQUE: Multiplanar, multiecho pulse sequences of the brain and surrounding structures were obtained without and with intravenous contrast. CONTRAST:  65mL MULTIHANCE GADOBENATE DIMEGLUMINE 529 MG/ML IV SOLN COMPARISON:  Restaging Brain MRI 10/29/2015 and earlier. Noncontrast head CT  1100 hours today. FINDINGS: Brain: Sequelae of left craniectomy with underlying left superior frontal gyrus, insula, and operculum encephalomalacia. Solid lobulated right paracentral posterior vertex meningioma encompasses 46 by 29 by 26 mm (AP by transverse by CC) versus 44 by 26 by 23 mm in June. Mild regional mass effect on the medial right parietal and posterior right superior frontal gyrus has not significantly changed. However, underlying white matter edema has progressed (series 7, image 20 today versus series 7, image 18 previously) and appears to affect the motor strip white matter. The second  meningioma along the left sphenoid planum at the anterior clinoid process is round and measures 21 by 20 x 13 mm (AP by transverse by CC) and is stable. Stable mass effect on the inferior left frontal gyrus there with no definite associated cerebral edema. Elsewhere stable gray and white matter signal. No restricted diffusion to suggest acute ischemia. Stable ventricle size and configuration. No acute intracranial mass effect. No acute intracranial hemorrhage identified. Negative pituitary and cervicomedullary junction. Vascular: Major intracranial vascular flow voids are stable since June, including evidence of chronic occlusion of the right ICA, and superior sagittal venous sinus at the posterior vertex (series 6, images 9 and 23 respectively). Skull and upper cervical spine: Negative. Normal bone marrow signal. Sinuses/Orbits: Visualized paranasal sinuses and mastoids are stable and well pneumatized. Stable and negative orbits soft tissues. Other: No acute scalp soft tissue findings. Visible internal auditory structures appear normal. IMPRESSION: 1. Mildly enlarged posterior right paracentral vertex meningioma since June, now 46 x 29 x 26 mm (versus 44 x 26 x 23 mm). No significant change in regional mass effect but underlying white matter edema has increased and appears to affect the medial motor strip. 2. Left anterior clinoid process region 2 cm meningioma has not significantly changed. No associated edema. 3. 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 Chronic occlusion of the right ICA. Chronic left hemisphere encephalomalacia underlying the craniotomy site craniectomy site. Electronically Signed   By: Genevie Ann M.D.   On: 04/05/2016 16:31   Mr Lumbar Spine Wo Contrast  Result Date: 04/05/2016 CLINICAL DATA:  Numbness in the knees to the toes.  No back pain.  * EXAM: MRI LUMBAR SPINE WITHOUT CONTRAST TECHNIQUE: Multiplanar, multisequence MR imaging of the lumbar  spine was performed. No intravenous contrast was administered. COMPARISON:  None. FINDINGS: Segmentation:  Standard. Alignment:  Physiologic. Vertebrae:  No fracture, evidence of discitis, or bone lesion. Conus medullaris: Extends to the L1 level and appears normal. Paraspinal and other soft tissues: No paraspinal abnormality. Partially visualize is a 4.7 cm right renal mass consistent with a cyst. Disc levels: Disc spaces: Degenerative disc disease mild disc height loss at L4-5. T12-L1: No significant disc bulge. No evidence of neural foraminal stenosis. No central canal stenosis. L1-L2: No significant disc bulge. No evidence of neural foraminal stenosis. No central canal stenosis. L2-L3: No significant disc bulge. No evidence of neural foraminal stenosis. No central canal stenosis. L3-L4: Minimal broad-based disc bulge. Mild bilateral facet arthropathy. Mild bilateral lateral recess stenosis. No foraminal stenosis. Mild central canal narrowing. L4-L5: Broad-based disc osteophyte complex. Bilateral moderate facet arthropathy. Mild spinal stenosis and bilateral lateral recess stenosis. No foraminal stenosis. L5-S1: Minimal broad-based disc bulge. Moderate bilateral facet arthropathy. No evidence of neural foraminal stenosis. No central canal stenosis. IMPRESSION: 1. At L3-4 there is minimal broad-based disc bulge. Mild bilateral facet arthropathy. Mild bilateral lateral recess stenosis. Mild central canal narrowing. 2.  At L4-5 there is a broad-based disc osteophyte complex. Bilateral moderate facet arthropathy. Mild spinal stenosis and bilateral lateral recess stenosis. 3. At L5-S1 there is minimal broad-based disc bulge. Moderate bilateral facet arthropathy. Electronically Signed   By: Kathreen Devoid   On: 04/05/2016 16:12   Mr Cervical Spine W Wo Contrast  Result Date: 04/07/2016 CLINICAL DATA:  Lower limb weakness. New versus recrudescent weakness from Guillain-Barre syndrome 1999. History of stroke, carotid  artery occlusion, hypertension, hyperlipidemia and intracranial meningioma. EXAM: MRI CERVICAL AND THORACIC SPINE WITHOUT AND WITH CONTRAST TECHNIQUE: Multiplanar and multiecho pulse sequences of the cervical spine, to include the craniocervical junction and cervicothoracic junction, and thoracic spine, were obtained without and with intravenous contrast. CONTRAST:  10mL MULTIHANCE GADOBENATE DIMEGLUMINE 529 MG/ML IV SOLN COMPARISON:  MRI head and lumbar spine April 05, 2016 FINDINGS: MRI CERVICAL SPINE FINDINGS ALIGNMENT: Straightened cervical lordosis.  No malalignment. VERTEBRAE/DISCS: Vertebral bodies are intact. Intervertebral disc morphology's are normal, mild desiccation of all cervical discs. Mild ventral endplate spurring H8-4 through C6-7. No abnormal bone marrow signal. No abnormal osseous or intradiscal enhancement. CORD:Cervical spinal cord is normal morphology and signal characteristics from the cervicomedullary junction to level of T1-2, the most caudal well visualized level. No abnormal cord, leptomeningeal or epidural enhancement. POSTERIOR FOSSA, VERTEBRAL ARTERIES, PARASPINAL TISSUES: No MR findings of ligamentous injury. Vertebral artery flow voids present. Included posterior fossa and paraspinal soft tissues are normal. DISC LEVELS: C2-3: No disc bulge, canal stenosis nor neural foraminal narrowing. Mild facet arthropathy. C3-4: Annular bulging. Small LEFT subarticular disc protrusion. Uncovertebral hypertrophy and mild facet arthropathy. No canal stenosis. Moderate to severe LEFT neural foraminal narrowing. C4-5: Annular bulging, uncovertebral hypertrophy and LEFT greater than RIGHT moderate facet arthropathy. No canal stenosis. Moderate bilateral neural foraminal narrowing. C5-6: Annular bulging eccentric laterally, small LEFT subarticular disc protrusion. Uncovertebral hypertrophy and mild facet arthropathy. Mild canal stenosis. Moderate RIGHT, moderate to severe LEFT neural foraminal  narrowing. C6-7: Uncovertebral hypertrophy and mild facet arthropathy. No canal stenosis or neural foraminal narrowing. C7-T1: No disc bulge, canal stenosis nor neural foraminal narrowing. MRI THORACIC SPINE FINDINGS- Mild motion degraded examination. ALIGNMENT: Maintenance of the thoracic kyphosis. No malalignment. VERTEBRAE/DISCS: Vertebral bodies are intact. Intervertebral discs morphology are normal, mild disc desiccation. Multilevel lower mild subacute discogenic endplate changes without abnormal bone marrow signal. No abnormal osseous or intradiscal enhancement. Scattered old Schmorl's nodes. CORD: Thoracic spinal cord is normal morphology and signal characteristics to the level of the conus medullaris which terminates at T12-L1. PREVERTEBRAL AND PARASPINAL SOFT TISSUES: At least 3.9 cm partially imaged RIGHT renal cyst. Dependent atelectasis. DISC LEVELS: No significant disc bulge, canal stenosis or neural foraminal narrowing at any level. Mild T10-11 facet arthropathy and ligamentum flavum redundancy. IMPRESSION: MRI CERVICAL SPINE:  Normal MRI of the cervical spinal cord. Degenerative change results in mild canal stenosis at C5-6. Neural foraminal narrowing C3-4 thru C5-6: Moderate to severe on the LEFT at C3-4 and C5-6. MRI THORACIC SPINE:  Normal MRI of the thoracic spinal cord. Early degenerative change of the thoracic spine without neurocompression. Electronically Signed   By: Elon Alas M.D.   On: 04/07/2016 21:20   Mr Thoracic Spine W Wo Contrast  Result Date: 04/07/2016 CLINICAL DATA:  Lower limb weakness. New versus recrudescent weakness from Guillain-Barre syndrome 1999. History of stroke, carotid artery occlusion, hypertension, hyperlipidemia and intracranial meningioma. EXAM: MRI CERVICAL AND THORACIC SPINE WITHOUT AND WITH CONTRAST TECHNIQUE: Multiplanar and multiecho pulse sequences of the cervical spine, to include the  craniocervical junction and cervicothoracic junction, and  thoracic spine, were obtained without and with intravenous contrast. CONTRAST:  36mL MULTIHANCE GADOBENATE DIMEGLUMINE 529 MG/ML IV SOLN COMPARISON:  MRI head and lumbar spine April 05, 2016 FINDINGS: MRI CERVICAL SPINE FINDINGS ALIGNMENT: Straightened cervical lordosis.  No malalignment. VERTEBRAE/DISCS: Vertebral bodies are intact. Intervertebral disc morphology's are normal, mild desiccation of all cervical discs. Mild ventral endplate spurring W6-5 through C6-7. No abnormal bone marrow signal. No abnormal osseous or intradiscal enhancement. CORD:Cervical spinal cord is normal morphology and signal characteristics from the cervicomedullary junction to level of T1-2, the most caudal well visualized level. No abnormal cord, leptomeningeal or epidural enhancement. POSTERIOR FOSSA, VERTEBRAL ARTERIES, PARASPINAL TISSUES: No MR findings of ligamentous injury. Vertebral artery flow voids present. Included posterior fossa and paraspinal soft tissues are normal. DISC LEVELS: C2-3: No disc bulge, canal stenosis nor neural foraminal narrowing. Mild facet arthropathy. C3-4: Annular bulging. Small LEFT subarticular disc protrusion. Uncovertebral hypertrophy and mild facet arthropathy. No canal stenosis. Moderate to severe LEFT neural foraminal narrowing. C4-5: Annular bulging, uncovertebral hypertrophy and LEFT greater than RIGHT moderate facet arthropathy. No canal stenosis. Moderate bilateral neural foraminal narrowing. C5-6: Annular bulging eccentric laterally, small LEFT subarticular disc protrusion. Uncovertebral hypertrophy and mild facet arthropathy. Mild canal stenosis. Moderate RIGHT, moderate to severe LEFT neural foraminal narrowing. C6-7: Uncovertebral hypertrophy and mild facet arthropathy. No canal stenosis or neural foraminal narrowing. C7-T1: No disc bulge, canal stenosis nor neural foraminal narrowing. MRI THORACIC SPINE FINDINGS- Mild motion degraded examination. ALIGNMENT: Maintenance of the thoracic  kyphosis. No malalignment. VERTEBRAE/DISCS: Vertebral bodies are intact. Intervertebral discs morphology are normal, mild disc desiccation. Multilevel lower mild subacute discogenic endplate changes without abnormal bone marrow signal. No abnormal osseous or intradiscal enhancement. Scattered old Schmorl's nodes. CORD: Thoracic spinal cord is normal morphology and signal characteristics to the level of the conus medullaris which terminates at T12-L1. PREVERTEBRAL AND PARASPINAL SOFT TISSUES: At least 3.9 cm partially imaged RIGHT renal cyst. Dependent atelectasis. DISC LEVELS: No significant disc bulge, canal stenosis or neural foraminal narrowing at any level. Mild T10-11 facet arthropathy and ligamentum flavum redundancy. IMPRESSION: MRI CERVICAL SPINE:  Normal MRI of the cervical spinal cord. Degenerative change results in mild canal stenosis at C5-6. Neural foraminal narrowing C3-4 thru C5-6: Moderate to severe on the LEFT at C3-4 and C5-6. MRI THORACIC SPINE:  Normal MRI of the thoracic spinal cord. Early degenerative change of the thoracic spine without neurocompression. Electronically Signed   By: Elon Alas M.D.   On: 04/07/2016 21:20   Dg Fluoro Guide Lumbar Puncture  Result Date: 04/07/2016 CLINICAL DATA:  Guillain-Barre syndrome. EXAM: DIAGNOSTIC LUMBAR PUNCTURE UNDER FLUOROSCOPIC GUIDANCE FLUOROSCOPY TIME:  Fluoroscopy Time:  0 minutes, 12 seconds Radiation Exposure Index (if provided by the fluoroscopic device): 1.8 mGy Number of Acquired Spot Images: 0 PROCEDURE: I discussed the risks (including hemorrhage, infection, headache, and nerve damage, among others), benefits, and alternatives to fluoroscopically guided lumbar puncture with the patient. We specifically discussed the high technical likelihood of success of the procedure. The patient understood and elected to undergo the procedure. Standard time-out was employed. Following sterile skin prep and local anesthetic administration  consisting of 1 percent lidocaine, a 22 gauge spinal needle was advanced without difficulty into the thecal sac at the at the L2-3 level. Clear CSF was returned. Opening pressure was 10 cm water. 12 cc of clear CSF was collected. The needle was subsequently removed and the skin cleansed and bandaged. No immediate complications were observed. IMPRESSION:  1. Successful lumbar puncture at the L2- 3 level with yielding 12 cc of clear CSF. Opening pressure was 10 cm of water. Electronically Signed   By: Van Clines M.D.   On: 04/07/2016 14:29     TODAY-DAY OF DISCHARGE:  Subjective:   [name] today has no headache,no chest abdominal pain,no new weakness tingling or numbness, feels much better wants to go home today.   Objective:   Blood pressure (!) 141/58, pulse 62, temperature 97.6 F (36.4 C), temperature source Oral, resp. rate 20, height 5\' 11"  (1.803 m), weight 99.7 kg (219 lb 14.4 oz), SpO2 100 %.  Intake/Output Summary (Last 24 hours) at 04/14/16 1130 Last data filed at 04/13/16 2100  Gross per 24 hour  Intake              240 ml  Output              400 ml  Net             -160 ml   Filed Weights   04/11/16 0537 04/13/16 0500 04/14/16 0519  Weight: 99 kg (218 lb 4.1 oz) 97.1 kg (214 lb) 99.7 kg (219 lb 14.4 oz)    Exam: Awake Alert, Oriented *3, No new F.N deficits, Normal affect .AT,PERRAL Supple Neck,No JVD, No cervical lymphadenopathy appriciated.  Symmetrical Chest wall movement, Good air movement bilaterally, CTAB RRR,No Gallops,Rubs or new Murmurs, No Parasternal Heave +ve B.Sounds, Abd Soft, Non tender, No organomegaly appriciated, No rebound -guarding or rigidity. No Cyanosis, Clubbing or edema, No new Rash or bruise   PERTINENT RADIOLOGIC STUDIES: Ct Head Wo Contrast  Result Date: 04/05/2016 CLINICAL DATA:  Falls, bilateral lower extremity numbness and weakness. EXAM: CT HEAD WITHOUT CONTRAST TECHNIQUE: Contiguous axial images were obtained from the  base of the skull through the vertex without intravenous contrast. COMPARISON:  MR brain 10/29/2015. FINDINGS: Brain: No evidence of an acute infarct, acute hemorrhage or hydrocephalus. A 3.5 cm hyper attenuating mass along the inner table of the high right parietal bone is unchanged. Adjacent low-attenuation/edema in the right parietal deep white matter may be slightly increased from 10/29/2015. A smaller hyperattenuating lesion along the floor of the left anterior cranial fossa (series 201, image 10) was better seen on 10/29/2015. Mild atrophy. Vascular: No hyperdense vessel or unexpected calcification. Skull: Left frontoparietal craniectomy. Sinuses/Orbits: Tiny right mastoid effusion. Other: None. IMPRESSION: 1. 3.5 cm meningioma along the right parietal vertex with suspected increase in the extent of edema in the adjacent right parietal deep white matter. 2. No evidence of an acute infarct. 3. Left MCA territory encephalomalacia. 4. Small meningioma along the floor of the left anterior cranial fossa, better seen on 10/29/2015. Electronically Signed   By: Lorin Picket M.D.   On: 04/05/2016 11:55   Mr Jeri Cos GB Contrast  Result Date: 04/05/2016 CLINICAL DATA:  75 year old male with 2 intracranial meningiomas under surveillance by neurosurgery. Recent hospitalization for UTI and left lower extremity infection. New lower extremity weakness. Initial encounter. EXAM: MRI HEAD WITHOUT AND WITH CONTRAST TECHNIQUE: Multiplanar, multiecho pulse sequences of the brain and surrounding structures were obtained without and with intravenous contrast. CONTRAST:  12mL MULTIHANCE GADOBENATE DIMEGLUMINE 529 MG/ML IV SOLN COMPARISON:  Restaging Brain MRI 10/29/2015 and earlier. Noncontrast head CT 1100 hours today. FINDINGS: Brain: Sequelae of left craniectomy with underlying left superior frontal gyrus, insula, and operculum encephalomalacia. Solid lobulated right paracentral posterior vertex meningioma encompasses 46 by  29 by 26 mm (AP by transverse  by CC) versus 44 by 26 by 23 mm in June. Mild regional mass effect on the medial right parietal and posterior right superior frontal gyrus has not significantly changed. However, underlying white matter edema has progressed (series 7, image 20 today versus series 7, image 18 previously) and appears to affect the motor strip white matter. The second meningioma along the left sphenoid planum at the anterior clinoid process is round and measures 21 by 20 x 13 mm (AP by transverse by CC) and is stable. Stable mass effect on the inferior left frontal gyrus there with no definite associated cerebral edema. Elsewhere stable gray and white matter signal. No restricted diffusion to suggest acute ischemia. Stable ventricle size and configuration. No acute intracranial mass effect. No acute intracranial hemorrhage identified. Negative pituitary and cervicomedullary junction. Vascular: Major intracranial vascular flow voids are stable since June, including evidence of chronic occlusion of the right ICA, and superior sagittal venous sinus at the posterior vertex (series 6, images 9 and 23 respectively). Skull and upper cervical spine: Negative. Normal bone marrow signal. Sinuses/Orbits: Visualized paranasal sinuses and mastoids are stable and well pneumatized. Stable and negative orbits soft tissues. Other: No acute scalp soft tissue findings. Visible internal auditory structures appear normal. IMPRESSION: 1. Mildly enlarged posterior right paracentral vertex meningioma since June, now 46 x 29 x 26 mm (versus 44 x 26 x 23 mm). No significant change in regional mass effect but underlying white matter edema has increased and appears to affect the medial motor strip. 2. Left anterior clinoid process region 2 cm meningioma has not significantly changed. No associated edema. 3. 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 Chronic occlusion of the right ICA. Chronic  left hemisphere encephalomalacia underlying the craniotomy site craniectomy site. Electronically Signed   By: Genevie Ann M.D.   On: 04/05/2016 16:31   Mr Lumbar Spine Wo Contrast  Result Date: 04/05/2016 CLINICAL DATA:  Numbness in the knees to the toes.  No back pain.  * EXAM: MRI LUMBAR SPINE WITHOUT CONTRAST TECHNIQUE: Multiplanar, multisequence MR imaging of the lumbar spine was performed. No intravenous contrast was administered. COMPARISON:  None. FINDINGS: Segmentation:  Standard. Alignment:  Physiologic. Vertebrae:  No fracture, evidence of discitis, or bone lesion. Conus medullaris: Extends to the L1 level and appears normal. Paraspinal and other soft tissues: No paraspinal abnormality. Partially visualize is a 4.7 cm right renal mass consistent with a cyst. Disc levels: Disc spaces: Degenerative disc disease mild disc height loss at L4-5. T12-L1: No significant disc bulge. No evidence of neural foraminal stenosis. No central canal stenosis. L1-L2: No significant disc bulge. No evidence of neural foraminal stenosis. No central canal stenosis. L2-L3: No significant disc bulge. No evidence of neural foraminal stenosis. No central canal stenosis. L3-L4: Minimal broad-based disc bulge. Mild bilateral facet arthropathy. Mild bilateral lateral recess stenosis. No foraminal stenosis. Mild central canal narrowing. L4-L5: Broad-based disc osteophyte complex. Bilateral moderate facet arthropathy. Mild spinal stenosis and bilateral lateral recess stenosis. No foraminal stenosis. L5-S1: Minimal broad-based disc bulge. Moderate bilateral facet arthropathy. No evidence of neural foraminal stenosis. No central canal stenosis. IMPRESSION: 1. At L3-4 there is minimal broad-based disc bulge. Mild bilateral facet arthropathy. Mild bilateral lateral recess stenosis. Mild central canal narrowing. 2. At L4-5 there is a broad-based disc osteophyte complex. Bilateral moderate facet arthropathy. Mild spinal stenosis and bilateral  lateral recess stenosis. 3. At L5-S1 there is minimal broad-based disc bulge. Moderate bilateral facet arthropathy. Electronically Signed  By: Kathreen Devoid   On: 04/05/2016 16:12   Mr Cervical Spine W Wo Contrast  Result Date: 04/07/2016 CLINICAL DATA:  Lower limb weakness. New versus recrudescent weakness from Guillain-Barre syndrome 1999. History of stroke, carotid artery occlusion, hypertension, hyperlipidemia and intracranial meningioma. EXAM: MRI CERVICAL AND THORACIC SPINE WITHOUT AND WITH CONTRAST TECHNIQUE: Multiplanar and multiecho pulse sequences of the cervical spine, to include the craniocervical junction and cervicothoracic junction, and thoracic spine, were obtained without and with intravenous contrast. CONTRAST:  66mL MULTIHANCE GADOBENATE DIMEGLUMINE 529 MG/ML IV SOLN COMPARISON:  MRI head and lumbar spine April 05, 2016 FINDINGS: MRI CERVICAL SPINE FINDINGS ALIGNMENT: Straightened cervical lordosis.  No malalignment. VERTEBRAE/DISCS: Vertebral bodies are intact. Intervertebral disc morphology's are normal, mild desiccation of all cervical discs. Mild ventral endplate spurring D2-2 through C6-7. No abnormal bone marrow signal. No abnormal osseous or intradiscal enhancement. CORD:Cervical spinal cord is normal morphology and signal characteristics from the cervicomedullary junction to level of T1-2, the most caudal well visualized level. No abnormal cord, leptomeningeal or epidural enhancement. POSTERIOR FOSSA, VERTEBRAL ARTERIES, PARASPINAL TISSUES: No MR findings of ligamentous injury. Vertebral artery flow voids present. Included posterior fossa and paraspinal soft tissues are normal. DISC LEVELS: C2-3: No disc bulge, canal stenosis nor neural foraminal narrowing. Mild facet arthropathy. C3-4: Annular bulging. Small LEFT subarticular disc protrusion. Uncovertebral hypertrophy and mild facet arthropathy. No canal stenosis. Moderate to severe LEFT neural foraminal narrowing. C4-5: Annular  bulging, uncovertebral hypertrophy and LEFT greater than RIGHT moderate facet arthropathy. No canal stenosis. Moderate bilateral neural foraminal narrowing. C5-6: Annular bulging eccentric laterally, small LEFT subarticular disc protrusion. Uncovertebral hypertrophy and mild facet arthropathy. Mild canal stenosis. Moderate RIGHT, moderate to severe LEFT neural foraminal narrowing. C6-7: Uncovertebral hypertrophy and mild facet arthropathy. No canal stenosis or neural foraminal narrowing. C7-T1: No disc bulge, canal stenosis nor neural foraminal narrowing. MRI THORACIC SPINE FINDINGS- Mild motion degraded examination. ALIGNMENT: Maintenance of the thoracic kyphosis. No malalignment. VERTEBRAE/DISCS: Vertebral bodies are intact. Intervertebral discs morphology are normal, mild disc desiccation. Multilevel lower mild subacute discogenic endplate changes without abnormal bone marrow signal. No abnormal osseous or intradiscal enhancement. Scattered old Schmorl's nodes. CORD: Thoracic spinal cord is normal morphology and signal characteristics to the level of the conus medullaris which terminates at T12-L1. PREVERTEBRAL AND PARASPINAL SOFT TISSUES: At least 3.9 cm partially imaged RIGHT renal cyst. Dependent atelectasis. DISC LEVELS: No significant disc bulge, canal stenosis or neural foraminal narrowing at any level. Mild T10-11 facet arthropathy and ligamentum flavum redundancy. IMPRESSION: MRI CERVICAL SPINE:  Normal MRI of the cervical spinal cord. Degenerative change results in mild canal stenosis at C5-6. Neural foraminal narrowing C3-4 thru C5-6: Moderate to severe on the LEFT at C3-4 and C5-6. MRI THORACIC SPINE:  Normal MRI of the thoracic spinal cord. Early degenerative change of the thoracic spine without neurocompression. Electronically Signed   By: Elon Alas M.D.   On: 04/07/2016 21:20   Mr Thoracic Spine W Wo Contrast  Result Date: 04/07/2016 CLINICAL DATA:  Lower limb weakness. New versus  recrudescent weakness from Guillain-Barre syndrome 1999. History of stroke, carotid artery occlusion, hypertension, hyperlipidemia and intracranial meningioma. EXAM: MRI CERVICAL AND THORACIC SPINE WITHOUT AND WITH CONTRAST TECHNIQUE: Multiplanar and multiecho pulse sequences of the cervical spine, to include the craniocervical junction and cervicothoracic junction, and thoracic spine, were obtained without and with intravenous contrast. CONTRAST:  14mL MULTIHANCE GADOBENATE DIMEGLUMINE 529 MG/ML IV SOLN COMPARISON:  MRI head and lumbar spine April 05, 2016 FINDINGS: MRI CERVICAL  SPINE FINDINGS ALIGNMENT: Straightened cervical lordosis.  No malalignment. VERTEBRAE/DISCS: Vertebral bodies are intact. Intervertebral disc morphology's are normal, mild desiccation of all cervical discs. Mild ventral endplate spurring F5-7 through C6-7. No abnormal bone marrow signal. No abnormal osseous or intradiscal enhancement. CORD:Cervical spinal cord is normal morphology and signal characteristics from the cervicomedullary junction to level of T1-2, the most caudal well visualized level. No abnormal cord, leptomeningeal or epidural enhancement. POSTERIOR FOSSA, VERTEBRAL ARTERIES, PARASPINAL TISSUES: No MR findings of ligamentous injury. Vertebral artery flow voids present. Included posterior fossa and paraspinal soft tissues are normal. DISC LEVELS: C2-3: No disc bulge, canal stenosis nor neural foraminal narrowing. Mild facet arthropathy. C3-4: Annular bulging. Small LEFT subarticular disc protrusion. Uncovertebral hypertrophy and mild facet arthropathy. No canal stenosis. Moderate to severe LEFT neural foraminal narrowing. C4-5: Annular bulging, uncovertebral hypertrophy and LEFT greater than RIGHT moderate facet arthropathy. No canal stenosis. Moderate bilateral neural foraminal narrowing. C5-6: Annular bulging eccentric laterally, small LEFT subarticular disc protrusion. Uncovertebral hypertrophy and mild facet  arthropathy. Mild canal stenosis. Moderate RIGHT, moderate to severe LEFT neural foraminal narrowing. C6-7: Uncovertebral hypertrophy and mild facet arthropathy. No canal stenosis or neural foraminal narrowing. C7-T1: No disc bulge, canal stenosis nor neural foraminal narrowing. MRI THORACIC SPINE FINDINGS- Mild motion degraded examination. ALIGNMENT: Maintenance of the thoracic kyphosis. No malalignment. VERTEBRAE/DISCS: Vertebral bodies are intact. Intervertebral discs morphology are normal, mild disc desiccation. Multilevel lower mild subacute discogenic endplate changes without abnormal bone marrow signal. No abnormal osseous or intradiscal enhancement. Scattered old Schmorl's nodes. CORD: Thoracic spinal cord is normal morphology and signal characteristics to the level of the conus medullaris which terminates at T12-L1. PREVERTEBRAL AND PARASPINAL SOFT TISSUES: At least 3.9 cm partially imaged RIGHT renal cyst. Dependent atelectasis. DISC LEVELS: No significant disc bulge, canal stenosis or neural foraminal narrowing at any level. Mild T10-11 facet arthropathy and ligamentum flavum redundancy. IMPRESSION: MRI CERVICAL SPINE:  Normal MRI of the cervical spinal cord. Degenerative change results in mild canal stenosis at C5-6. Neural foraminal narrowing C3-4 thru C5-6: Moderate to severe on the LEFT at C3-4 and C5-6. MRI THORACIC SPINE:  Normal MRI of the thoracic spinal cord. Early degenerative change of the thoracic spine without neurocompression. Electronically Signed   By: Elon Alas M.D.   On: 04/07/2016 21:20   Dg Fluoro Guide Lumbar Puncture  Result Date: 04/07/2016 CLINICAL DATA:  Guillain-Barre syndrome. EXAM: DIAGNOSTIC LUMBAR PUNCTURE UNDER FLUOROSCOPIC GUIDANCE FLUOROSCOPY TIME:  Fluoroscopy Time:  0 minutes, 12 seconds Radiation Exposure Index (if provided by the fluoroscopic device): 1.8 mGy Number of Acquired Spot Images: 0 PROCEDURE: I discussed the risks (including hemorrhage,  infection, headache, and nerve damage, among others), benefits, and alternatives to fluoroscopically guided lumbar puncture with the patient. We specifically discussed the high technical likelihood of success of the procedure. The patient understood and elected to undergo the procedure. Standard time-out was employed. Following sterile skin prep and local anesthetic administration consisting of 1 percent lidocaine, a 22 gauge spinal needle was advanced without difficulty into the thecal sac at the at the L2-3 level. Clear CSF was returned. Opening pressure was 10 cm water. 12 cc of clear CSF was collected. The needle was subsequently removed and the skin cleansed and bandaged. No immediate complications were observed. IMPRESSION: 1. Successful lumbar puncture at the L2- 3 level with yielding 12 cc of clear CSF. Opening pressure was 10 cm of water. Electronically Signed   By: Van Clines M.D.   On: 04/07/2016 14:29  PERTINENT LAB RESULTS: CBC:  Recent Labs  04/13/16 0305 04/14/16 0359  WBC 9.2 11.5*  HGB 12.1* 12.4*  HCT 38.1* 38.8*  PLT 218 217   CMET CMP     Component Value Date/Time   NA 136 04/13/2016 0305   K 3.6 04/13/2016 0305   CL 107 04/13/2016 0305   CO2 24 04/13/2016 0305   GLUCOSE 114 (H) 04/13/2016 0305   BUN 20 04/13/2016 0305   CREATININE 1.37 (H) 04/13/2016 0305   CALCIUM 8.6 (L) 04/13/2016 0305   PROT 7.8 04/13/2016 0305   ALBUMIN 2.2 (L) 04/13/2016 0305   AST 23 04/13/2016 0305   ALT 20 04/13/2016 0305   ALKPHOS 79 04/13/2016 0305   BILITOT 0.2 (L) 04/13/2016 0305   GFRNONAA 49 (L) 04/13/2016 0305   GFRAA 57 (L) 04/13/2016 0305    GFR Estimated Creatinine Clearance: 56.1 mL/min (by C-G formula based on SCr of 1.37 mg/dL (H)). No results for input(s): LIPASE, AMYLASE in the last 72 hours. No results for input(s): CKTOTAL, CKMB, CKMBINDEX, TROPONINI in the last 72 hours. Invalid input(s): POCBNP No results for input(s): DDIMER in the last 72  hours. No results for input(s): HGBA1C in the last 72 hours. No results for input(s): CHOL, HDL, LDLCALC, TRIG, CHOLHDL, LDLDIRECT in the last 72 hours. No results for input(s): TSH, T4TOTAL, T3FREE, THYROIDAB in the last 72 hours.  Invalid input(s): FREET3 No results for input(s): VITAMINB12, FOLATE, FERRITIN, TIBC, IRON, RETICCTPCT in the last 72 hours. Coags:  Recent Labs  04/13/16 0305 04/14/16 0359  INR 2.00 2.28   Microbiology: Recent Results (from the past 240 hour(s))  Urine culture     Status: None   Collection Time: 04/04/16  9:32 PM  Result Value Ref Range Status   Specimen Description URINE, RANDOM  Final   Special Requests NONE  Final   Culture NO GROWTH  Final   Report Status 04/06/2016 FINAL  Final  Culture, blood (routine x 2)     Status: None   Collection Time: 04/06/16  7:44 PM  Result Value Ref Range Status   Specimen Description BLOOD RIGHT ANTECUBITAL  Final   Special Requests BOTTLES DRAWN AEROBIC AND ANAEROBIC 5CC EAC  Final   Culture NO GROWTH 5 DAYS  Final   Report Status 04/11/2016 FINAL  Final  Culture, blood (routine x 2)     Status: None   Collection Time: 04/06/16  7:49 PM  Result Value Ref Range Status   Specimen Description BLOOD BLOOD RIGHT HAND  Final   Special Requests IN PEDIATRIC BOTTLE 3CC  Final   Culture NO GROWTH 5 DAYS  Final   Report Status 04/11/2016 FINAL  Final  CSF culture     Status: None   Collection Time: 04/07/16  2:13 PM  Result Value Ref Range Status   Specimen Description CSF  Final   Special Requests NONE  Final   Gram Stain   Final    WBC PRESENT, PREDOMINANTLY MONONUCLEAR NO ORGANISMS SEEN CYTOSPIN    Culture   Final    RARE STAPHYLOCOCCUS SPECIES (COAGULASE NEGATIVE) CRITICAL RESULT CALLED TO, READ BACK BY AND VERIFIED WITH: S KIMMEL,RN AT 1234 04/10/16 BY L BENFIELD    Report Status 04/12/2016 FINAL  Final   Organism ID, Bacteria STAPHYLOCOCCUS SPECIES (COAGULASE NEGATIVE)  Final      Susceptibility    Staphylococcus species (coagulase negative) - MIC*    CIPROFLOXACIN >=8 RESISTANT Resistant     ERYTHROMYCIN >=8 RESISTANT Resistant  GENTAMICIN <=0.5 SENSITIVE Sensitive     OXACILLIN 0.5 RESISTANT Resistant     TETRACYCLINE >=16 RESISTANT Resistant     VANCOMYCIN 2 SENSITIVE Sensitive     TRIMETH/SULFA 80 RESISTANT Resistant     CLINDAMYCIN <=0.25 RESISTANT Resistant     RIFAMPIN <=0.5 SENSITIVE Sensitive     Inducible Clindamycin POSITIVE Resistant     * RARE STAPHYLOCOCCUS SPECIES (COAGULASE NEGATIVE)    FURTHER DISCHARGE INSTRUCTIONS:  Get Medicines reviewed and adjusted: Please take all your medications with you for your next visit with your Primary MD  Laboratory/radiological data: Please request your Primary MD to go over all hospital tests and procedure/radiological results at the follow up, please ask your Primary MD to get all Hospital records sent to his/her office.  In some cases, they will be blood work, cultures and biopsy results pending at the time of your discharge. Please request that your primary care M.D. goes through all the records of your hospital data and follows up on these results.  Also Note the following: If you experience worsening of your admission symptoms, develop shortness of breath, life threatening emergency, suicidal or homicidal thoughts you must seek medical attention immediately by calling 911 or calling your MD immediately  if symptoms less severe.  You must read complete instructions/literature along with all the possible adverse reactions/side effects for all the Medicines you take and that have been prescribed to you. Take any new Medicines after you have completely understood and accpet all the possible adverse reactions/side effects.   Do not drive when taking Pain medications or sleeping medications (Benzodaizepines)  Do not take more than prescribed Pain, Sleep and Anxiety Medications. It is not advisable to combine anxiety,sleep and  pain medications without talking with your primary care practitioner  Special Instructions: If you have smoked or chewed Tobacco  in the last 2 yrs please stop smoking, stop any regular Alcohol  and or any Recreational drug use.  Wear Seat belts while driving.  Please note: You were cared for by a hospitalist during your hospital stay. Once you are discharged, your primary care physician will handle any further medical issues. Please note that NO REFILLS for any discharge medications will be authorized once you are discharged, as it is imperative that you return to your primary care physician (or establish a relationship with a primary care physician if you do not have one) for your post hospital discharge needs so that they can reassess your need for medications and monitor your lab values.  Total Time spent coordinating discharge including counseling, education and face to face time equals 45 minutes.  SignedOren Binet 04/14/2016 11:30 AM

## 2016-04-14 NOTE — H&P (Signed)
Physical Medicine and Rehabilitation Admission H&P    Chief Complaint  Patient presents with  . BLE numbness and weakness due to GBS    HPI:  [name] is a 75 y.o. male with history of HTN, CKD, recent diagnosis of PAF, CVA, crani for meningioma resection, recent admissions for urosepsis with urinary retention and left knee cellulitis, h/o GBS who was admitted on 04/04/16 with 2-3 day history of progressive BLE numbness with tingling and falls. MRI spine without evidence of significant stenosis. MRI brain with mildly enlarged right paracentral meningioma and stable left clinoid process meningioma. Neurology evaluated patient and exam consistent with significant BLE weakness with areflexia and BUE with hyporeflexia. He was started on IVIG on 11/19 without significant improvement. LP 11/22  with elevated levels of glucose and protein with cytoalbuminologic dissociation concerning for GBS and he was started on another round of IVIG. CSF cultures with rare staph that was felt to be contaminant per Dr. Tommy Medal who recommended monitoring at this time. BLE edema treated with diuresis. PT ongoing revealing diffuse weakness requiring Stedy to stand with inability to stand and CIR recommended for follow up therapy.    Review of Systems  HENT: Negative for hearing loss and tinnitus.   Respiratory: Negative for cough and hemoptysis.   Cardiovascular: Negative for chest pain and palpitations.  Gastrointestinal: Negative for constipation, heartburn and nausea.  Genitourinary:       Foley due to urinary retention.   Musculoskeletal: Positive for joint pain.  Neurological: Positive for tingling, sensory change and focal weakness. Negative for dizziness.  Psychiatric/Behavioral: The patient does not have insomnia.   All other systems reviewed and are negative.     Past Medical History:  Diagnosis Date  . Atrial fibrillation ([place]) 02/2016   NEW ONSET  . Brain tumor (benign) ([place]) 1998   2  small tumors- since 2015 (Dr. [name] following)  . Carotid artery occlusion   . Cerebrovascular disease September 09, 2001   TIA, Left brain  . CKD (chronic kidney disease), stage III   . Diverticulosis   . ED (erectile dysfunction)   . Guillain-Barre syndrome ([place]) 1999  . Hx of elevated lipids   . Hyperlipidemia   . Hypertension   . Memory loss   . Stroke [facility]) 2003    Past Surgical History:  Procedure Laterality Date  . BRAIN SURGERY  1998   Benign brain tumor removed, Left hemisphere- ? Meningioma  . CAROTID ENDARTERECTOMY Left September 13, 2001   LEFT cea  . CHOLECYSTECTOMY  Nov. 2001  . COLON SURGERY  Feb. 2003   Colonic polyps removed endoscopically  . COLONOSCOPY WITH PROPOFOL N/A 11/11/2015   Procedure: COLONOSCOPY WITH PROPOFOL;  Surgeon: Garlan Fair, MD;  Location: [facility] ENDOSCOPY;  Service: Endoscopy;  Laterality: N/A;  . INGUINAL HERNIA REPAIR  1970's    Family History  Problem Relation Age of Onset  . Colon cancer Mother     Metastatic   . Cancer Mother     Colon  . Stroke Father   . Diabetes Father     Social History: Married. Independent with walker use for past 1.5 months. 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 He reports that he quit smoking about 19 years ago. His smoking use included Cigarettes. He has never used smokeless tobacco. He reports that he does not drink alcohol or use    Allergies  Allergen Reactions  . Asa [Aspirin] Other (See  Comments)    Severe skin bruising   . Immune Globulins Other (See Comments)    Tetanus Immunes Globulins    (  Pt. Cannot take the Flu shot ) Guillian Barre     Medications Prior to Admission  Medication Sig Dispense Refill  . acetaminophen (TYLENOL) 500 MG tablet Take 1,000 mg by mouth every 6 (six) hours as needed for headache (pain).    . cholecalciferol (VITAMIN D) 1000 units tablet Take 1,000 Units by mouth daily.    Marland Kitchen diltiazem (CARDIZEM CD) 120 MG 24 hr capsule Take 1  capsule (120 mg total) by mouth daily. 30 capsule 0  . furosemide (LASIX) 80 MG tablet Take 40-80 mg by mouth daily as needed for fluid or edema (weight gain of 2 lbs in 24 hours or 5 lbs in a week).     . metoprolol (LOPRESSOR) 50 MG tablet Take 1 tablet (50 mg total) by mouth 2 (two) times daily. 60 tablet 0  . rosuvastatin (CRESTOR) 40 MG tablet Take 40 mg by mouth daily.    . tamsulosin (FLOMAX) 0.4 MG CAPS capsule Take 1 capsule (0.4 mg total) by mouth daily after supper. 30 capsule 0  . vitamin B-12 (CYANOCOBALAMIN) 1000 MCG tablet Take 1,000 mcg by mouth 2 (two) times a week. Wednesday and Saturday    . warfarin (COUMADIN) 5 MG tablet Take 1 tablet (5 mg total) by mouth daily at 6 PM. 30 tablet 0    Home: Home Living Family/patient expects to be discharged to:: Private residence Living Arrangements: Spouse/significant other Available Help at Discharge: Family, Available 24 hours/day Type of Home: House Home Access: Stairs to enter Technical brewer of Steps: 2 Home Layout: Two level, Able to live on main level with bedroom/bathroom Alternate Level Stairs-Number of Steps: flight Bathroom Shower/Tub: Walk-in shower (on second floor) Biochemist, clinical: Standard Home Equipment: Environmental consultant - 2 wheels, Bedside commode, Toilet riser   Functional History: Prior Function Level of Independence: Independent with assistive device(s) Comments: Pt had recent admission with Lt knee cellulitis and has been using RW since then. Receiving HHPT PTA  Functional Status:  Mobility: Bed Mobility Overal bed mobility: Needs Assistance Bed Mobility: Supine to Sit Rolling: Supervision Sidelying to sit: Supervision Supine to sit: Supervision Sit to supine: Supervision General bed mobility comments: no physical assist, used rail, HOB up Transfers Overall transfer level: Needs assistance Equipment used: Rolling walker (2 wheeled) Transfer via Lift Equipment: Stedy Transfers: Sit to/from Stand,  Risk manager Sit to Stand: Min assist, +2 safety/equipment Stand pivot transfers: Min assist, +2 safety/equipment General transfer comment: Pivot steps with RW bed to recliner. No knee buckling noted. Ambulation/Gait Ambulation/Gait assistance:  (unable.  ) General Gait Details: unable    ADL: ADL Overall ADL's : Needs assistance/impaired Eating/Feeding: Independent, Bed level Grooming: Wash/dry hands, Wash/dry face, Oral care, Sitting, Set up Upper Body Bathing: Minimal assitance, Sitting Lower Body Bathing: Moderate assistance, Sit to/from stand Upper Body Dressing : Minimal assistance, Sitting Lower Body Dressing: Moderate assistance, Sit to/from stand Lower Body Dressing Details (indicate cue type and reason): pt able to don and doff socks with min assist Toilet Transfer: +2 for safety/equipment, Minimal assistance, Stand-pivot, RW Toileting- Clothing Manipulation and Hygiene: Total assistance, Sit to/from stand Toileting - Clothing Manipulation Details (indicate cue type and reason): unable to release walker in standing  Cognition: Cognition Overall Cognitive Status: Within Functional Limits for tasks assessed Orientation Level: Oriented X4 Cognition Arousal/Alertness: Awake/alert Behavior During Therapy: WFL for tasks assessed/performed Overall  Cognitive Status: Within Functional Limits for tasks assessed   Blood pressure (!) 131/49, pulse (!) 58, temperature 98 F (36.7 C), temperature source Oral, resp. rate 20, height _0  (1.803 m), weight 97.1 kg (214 lb), SpO2 94 %. Physical Exam  Nursing note and vitals reviewed. Constitutional: He is oriented to person, place, and time. He appears well-developed and well-nourished.  HENT:  Head: Normocephalic and atraumatic.  Mouth/Throat: Oropharynx is clear and moist.  Eyes: Conjunctivae and EOM are normal. Pupils are equal, round, and reactive to light.  Neck: Normal range of motion. Neck supple.  Cardiovascular:  Normal rate and regular rhythm.   Respiratory: Effort normal and breath sounds normal. No stridor. No respiratory distress. He has no wheezes.  GI: Soft. Bowel sounds are normal. He exhibits no distension. There is no tenderness.  Musculoskeletal: He exhibits edema (min pedal edema). He exhibits no tenderness.  No erythema left knee  Neurological: He is alert and oriented to person, place, and time.  Minimal dysarthria.  Able to follow basic commands without difficulty.  Sensory deficits b/l plantar feet (baseline) DTRs 1+ b/l LE RUE with pronator drift with proximal weakness.  Motor: RUE/RLE: 4/5 proximal to distal LUE/LLE: 4-/5 proximal to distal   Skin: Skin is warm and dry. No rash noted. No erythema.  Psychiatric: He has a normal mood and affect. His behavior is normal. Judgment and thought content normal.    Results for orders placed or performed during the hospital encounter of 04/04/16 (from the past 48 hour(s))  Protime-INR     Status: Abnormal   Collection Time: 04/12/16  6:55 AM  Result Value Ref Range   Prothrombin Time 19.8 (H) 11.4 - 15.2 seconds   INR 1.66   CBC     Status: Abnormal   Collection Time: 04/12/16  6:55 AM  Result Value Ref Range   WBC 9.9 4.0 - 10.5 K/uL   RBC 4.44 4.22 - 5.81 MIL/uL   Hemoglobin 12.5 (L) 13.0 - 17.0 g/dL   HCT 39.1 39.0 - 52.0 %   MCV 88.1 78.0 - 100.0 fL   MCH 28.2 26.0 - 34.0 pg   MCHC 32.0 30.0 - 36.0 g/dL   RDW 13.2 11.5 - 15.5 %   Platelets 217 150 - 400 K/uL  Protime-INR     Status: Abnormal   Collection Time: 04/13/16  3:05 AM  Result Value Ref Range   Prothrombin Time 23.0 (H) 11.4 - 15.2 seconds   INR 2.00   CBC with Differential/Platelet     Status: Abnormal   Collection Time: 04/13/16  3:05 AM  Result Value Ref Range   WBC 9.2 4.0 - 10.5 K/uL   RBC 4.32 4.22 - 5.81 MIL/uL   Hemoglobin 12.1 (L) 13.0 - 17.0 g/dL   HCT 38.1 (L) 39.0 - 52.0 %   MCV 88.2 78.0 - 100.0 fL   MCH 28.0 26.0 - 34.0 pg   MCHC 31.8 30.0 -  36.0 g/dL   RDW 13.5 11.5 - 15.5 %   Platelets 218 150 - 400 K/uL   Neutrophils Relative % 68 %   Neutro Abs 6.3 1.7 - 7.7 K/uL   Lymphocytes Relative 20 %   Lymphs Abs 1.8 0.7 - 4.0 K/uL   Monocytes Relative 8 %   Monocytes Absolute 0.8 0.1 - 1.0 K/uL   Eosinophils Relative 4 %   Eosinophils Absolute 0.3 0.0 - 0.7 K/uL   Basophils Relative 0 %   Basophils Absolute 0.0 0.0 -  0.1 K/uL  Comprehensive metabolic panel     Status: Abnormal   Collection Time: 04/13/16  3:05 AM  Result Value Ref Range   Sodium 136 135 - 145 mmol/L   Potassium 3.6 3.5 - 5.1 mmol/L   Chloride 107 101 - 111 mmol/L   CO2 24 22 - 32 mmol/L   Glucose, Bld 114 (H) 65 - 99 mg/dL   BUN 20 6 - 20 mg/dL   Creatinine, Ser 1.37 (H) 0.61 - 1.24 mg/dL   Calcium 8.6 (L) 8.9 - 10.3 mg/dL   Total Protein 7.8 6.5 - 8.1 g/dL   Albumin 2.2 (L) 3.5 - 5.0 g/dL   AST 23 15 - 41 U/L   ALT 20 17 - 63 U/L   Alkaline Phosphatase 79 38 - 126 U/L   Total Bilirubin 0.2 (L) 0.3 - 1.2 mg/dL   GFR calc non Af Amer 49 (L) >60 mL/min   GFR calc Af Amer 57 (L) >60 mL/min    Comment: (NOTE) The eGFR has been calculated using the CKD EPI equation. This calculation has not been validated in all clinical situations. eGFR's persistently <60 mL/min signify possible Chronic Kidney Disease.    Anion gap 5 5 - 15  Magnesium     Status: None   Collection Time: 04/13/16  3:05 AM  Result Value Ref Range   Magnesium 2.0 1.7 - 2.4 mg/dL   No results found.     Medical Problem List and Plan: 1.  Weakness, gait abnormality, poor activity tolerance secondary to GBS (with history of GBS). 2.  DVT Prophylaxis/Anticoagulation: Pharmaceutical: Coumadin 3. Pain Management:  Will add low dose Neurontin. Tylenol prn effective 4. Mood: Motivated to work hard and get better. LCSW to follow for evaluation and support.  5. Neuropsych: This patient is capable of making decisions on his own behalf. 6. Skin/Wound Care: routine pressure relief measures.   7. Fluids/Electrolytes/Nutrition: Monitor I/O. Check lytes in am.  8. HTN: Monitor BID. Continue metoprolol and cardizem 9. A fib: in NSR--Monitor HR bid--controlled on cardizem, metoprolol and coumadin.   10. Recurrent meningiomas: Being monitored.  11. CKD: Monitor with serial checks. Baseline Cr- 1.4-1.5.  12. Urinary retention: Has failed one voiding trial and was to follow with GU last week.  Will repeat voiding trial in next few days.  13. Leucocytosis: Will check UA/UCS.    Post Admission Physician Evaluation: 1. Preadmission assessment reviewed and changes made below. 2. Functional deficits secondary to GBS. 3. Patient is admitted to receive collaborative, interdisciplinary care between the physiatrist, rehab nursing staff, and therapy team. 4. Patient's level of medical complexity and substantial therapy needs in context of that medical necessity cannot be provided at a lesser intensity of care such as a SNF. 5. Patient has experienced substantial functional loss from his/her baseline which was documented above under the "Functional History" and "Functional Status" headings.  Judging by the patient's diagnosis, physical exam, and functional history, the patient has potential for functional progress which will result in measurable gains while on inpatient rehab.  These gains will be of substantial and practical use upon discharge  in facilitating mobility and self-care at the household level. 6. Physiatrist will provide 24 hour management of medical needs as well as oversight of the therapy plan/treatment and provide guidance as appropriate regarding the interaction of the two. 7. The Preadmission Screening has been reviewed and patient status is unchanged unless otherwise stated above. 8. 24 hour rehab nursing will assist with bladder management, safety,  disease management, pain management and patient education  and help integrate therapy concepts, techniques,education, etc. 9. PT will  assess and treat for/with: Lower extremity strength, range of motion, stamina, balance, functional mobility, safety, adaptive techniques and equipment, coping skills, pain control, neurologic recovery education.   Goals are: Supervision/Min A. 10. OT will assess and treat for/with: ADL's, functional mobility, safety, upper extremity strength, adaptive techniques and equipment, wound mgt, ego support, and community reintegration.   Goals are: Min A. Therapy may proceed with showering this patient. 11. Case Management and Social Worker will assess and treat for psychological issues and discharge planning. 12. Team conference will be held weekly to assess progress toward goals and to determine barriers to discharge. 13. Patient will receive at least 3 hours of therapy per day at least 5 days per week. 14. ELOS: 15-19 days.       15. Prognosis:  good  Delice Lesch, MD, Mellody Drown 04/13/2016

## 2016-04-14 NOTE — Progress Notes (Signed)
Rehab admissions - I have received approval for acute inpatient rehab admission from [facility] carrier.  Bed available and will admit to acute inpatient rehab today.  Call me for questions.  #[phone]

## 2016-04-14 NOTE — Progress Notes (Signed)
Patient and his family was informed about rehab process including patient safety plan and rehab booklet.

## 2016-04-14 NOTE — Care Management Important Message (Signed)
Important Message  Patient Details  Name: [name] MRN: [id number] Date of Birth: 23-Feb-1941   Medicare Important Message Given:  Yes    Jayne Peckenpaugh Abena 04/14/2016, 11:15 AM

## 2016-04-14 NOTE — Progress Notes (Signed)
Physical Therapy Treatment Patient Details Name: [name] MRN: [id number] DOB: January 18, 1941 Today's Date: 04/14/2016    History of Present Illness 75 y.o.malewho presents today with 2-3 day history of new onset bilateral lower extremity numbness and weakness. His PMHx includes prior CVA, left CEA, craniotomy for meningioma resection, GBS in 1999, HTN, HLD, CKD Stage 3 and recently diagnosed paroxysmal atrial fibrillation. He takes Coumadin at home.. Patient began having falls at home just prior to admission and fell 11/20 pm while hospitalized.    PT Comments    The pt requires assistance with all functional activities due to decreased strength of BLEs and impaired balance.  Pt was able to ambulate today with +2 assistance and a chair following for safety. Pt will be appropriate for inpatient rehab to address these impairments when he discharges from acute care.  Continue with POC.  Follow Up Recommendations  CIR     Equipment Recommendations  Other (comment)    Recommendations for Other Services OT consult;Rehab consult     Precautions / Restrictions Precautions Precautions: Fall Precaution Comments: Only recent h/o falls. Pt independent with steady gait prior to acute illness. Restrictions Weight Bearing Restrictions: No    Mobility  Bed Mobility Overal bed mobility: Needs Assistance Bed Mobility: Supine to Sit     Supine to sit: Min assist;HOB elevated     General bed mobility comments: Min A to elevate trunk  Transfers Overall transfer level: Needs assistance Equipment used: Rolling walker (2 wheeled) Transfers: Sit to/from Stand (From EOB and chair x 2.) Sit to Stand: Mod assist;+2 physical assistance         General transfer comment: Mod A + 2 to power up from EOB and chair. Pt LOB x 1 and assistance needed to correct. Verbal cues for hand placement.  Ambulation/Gait Ambulation/Gait assistance: Mod assist;+2 physical assistance Ambulation Distance  (Feet): 20 Feet (Additional trial of 20 after rest break.) Assistive device: Rolling walker (2 wheeled) Gait Pattern/deviations: Step-through pattern;Decreased step length - right;Decreased step length - left;Trunk flexed   Gait velocity interpretation: Below normal speed for age/gender General Gait Details: Slow, labored gait; pt required rest break O2 saturation was 97%.  Verbal cues for upright posture and assistance needed to propel RW forward and while turning.   Stairs            Wheelchair Mobility    Modified Rankin (Stroke Patients Only)       Balance     Sitting balance-Leahy Scale: Good       Standing balance-Leahy Scale: Poor Standing balance comment: Needs continual UE support while standing and during gait.                    Cognition Arousal/Alertness: Awake/alert Behavior During Therapy: WFL for tasks assessed/performed Overall Cognitive Status: Within Functional Limits for tasks assessed                      Exercises Total Joint Exercises Ankle Circles/Pumps: AROM;Both;10 reps;Seated Quad Sets: AROM;Both;10 reps;Seated Towel Squeeze: AROM;Both;10 reps;Seated Hip ABduction/ADduction: AROM;Both;10 reps;Seated Straight Leg Raises: AROM;Both;10 reps;Seated Long Arc Quad: AROM;Both;10 reps;Seated Other Exercises Other Exercises: Pt required verbal cues for technique and to perform exercises in a slow and controlled manner.      General Comments        Pertinent Vitals/Pain Pain Assessment: No/denies pain    Home Living  Prior Function            PT Goals (current goals can now be found in the care plan section) Acute Rehab PT Goals Patient Stated Goal: to return to chopping wood PT Goal Formulation: With patient/family Time For Goal Achievement: 04/20/16 Potential to Achieve Goals: Fair Progress towards PT goals: Progressing toward goals    Frequency    Min 3X/week      PT Plan  Current plan remains appropriate    Co-evaluation             End of Session Equipment Utilized During Treatment: Gait belt Activity Tolerance: Patient tolerated treatment well Patient left: in chair;with call bell/phone within reach;with chair alarm set;with family/visitor present     Time: 8675-4492 PT Time Calculation (min) (ACUTE ONLY): 27 min  Charges:  $Gait Training: 8-22 mins $Therapeutic Exercise: 8-22 mins                    G Codes:      Bary Castilla 2016-05-08, 3:35 PM  Rito Ehrlich. [place], [place]

## 2016-04-14 NOTE — Progress Notes (Signed)
Neurology Progress Note  Subjective: Chart reviewed. He slept well overnight. He is eating without difficultly.  He still has some tingling in the soles of his feet but thinks this may be a little better today. He has no muscle cramps. He feels like the strength and sensation in his legs continues to improve. No new neurologic complaints this morning. Remainder of 14-point ROS negative.   Per chart, awaiting decision about possible admission to CIR with rehab admissions coordinator anticipating a determination this morning.   Current Meds:   Current Facility-Administered Medications:  .  acetaminophen (TYLENOL) tablet 650 mg, 650 mg, Oral, Q6H PRN, Lavina Hamman, MD, 650 mg at 04/12/16 1624 .  diltiazem (CARDIZEM CD) 24 hr capsule 120 mg, 120 mg, Oral, Daily, Geradine Girt, DO, 120 mg at 04/13/16 0944 .  diphenhydrAMINE (BENADRYL) capsule 25 mg, 25 mg, Oral, Daily PRN, Domenic Polite, MD, 25 mg at 04/12/16 1623 .  metoprolol (LOPRESSOR) tablet 50 mg, 50 mg, Oral, BID, Domenic Polite, MD, 50 mg at 04/12/16 0954 .  ondansetron (ZOFRAN) tablet 4 mg, 4 mg, Oral, Q6H PRN **OR** ondansetron (ZOFRAN) injection 4 mg, 4 mg, Intravenous, Q6H PRN, Geradine Girt, DO .  rosuvastatin (CRESTOR) tablet 40 mg, 40 mg, Oral, q1800, Tomi Bamberger Vann, DO, 40 mg at 04/13/16 1846 .  sodium chloride flush (NS) 0.9 % injection 3 mL, 3 mL, Intravenous, Q12H, Jessica U Vann, DO, 3 mL at 04/13/16 0946 .  tamsulosin (FLOMAX) capsule 0.4 mg, 0.4 mg, Oral, QPC supper, Geradine Girt, DO, 0.4 mg at 04/13/16 1846 .  vitamin B-12 (CYANOCOBALAMIN) tablet 1,000 mcg, 1,000 mcg, Oral, Once per day on Wed Sat, Jessica U Vann, DO, 1,000 mcg at 04/10/16 8546 .  Warfarin - Pharmacist Dosing Inpatient, , Does not apply, q1800, Honor Loh, RPH  Objective:  Temp:  [97.9 F (36.6 C)-99.2 F (37.3 C)] 99.2 F (37.3 C) (11/29 0519) Pulse Rate:  [58-67] 65 (11/29 0519) Resp:  [19-20] 20 (11/29 0519) BP: (131-151)/(45-62) 131/45  (11/29 0519) SpO2:  [94 %-100 %] 100 % (11/29 0519) Weight:  [99.7 kg (219 lb 14.4 oz)] 99.7 kg (219 lb 14.4 oz) (11/29 0519)  General: WDWN in NAD. Alert, oriented x4. Speech is clear without dysarthria. Affect is bright. Comportment is normal.  HEENT: Neck is supple without lymphadenopathy. He has mild muscle tightness in the neck and shoulders bilaterally. Mucous membranes are moist and the oropharynx is clear. Sclerae are anicteric. There is no conjunctival injection.  CV: Regular, no murmur. Carotid pulses are 2+ and symmetric with no bruits. Distal pulses 2+ and symmetric.  Lungs: CTAB  Extremities: No cyanosis. Mild edema B ankles, L>R. Neuro: MS: As noted above. No aphasia.  CN: Pupils are equal and reactive from 3-->2 mm bilaterally. Visual fields are full to confrontation. EOMI with some breakup of smooth pursuits, no nystagmus. Facial sensation is intact to light touch. Face is symmetric at rest with normal strength and mobility. He has mild intermittent L facial spasm. Hearing is intact to conversational voice. Voice is normal in tone and quality. Palate elevates symmetrically. Uvula is midline. Bilateral SCM and trapezii are 5/5. Tongue is midline with normal bulk and mobility.  Motor: Normal bulk, tone. Strength is normal with the exception of 4+/5 L KF/KE and L DF; 4+/5 L PF. No pronator drift. He has a mild intention tremor in BUE, L>R.  Sensation: Light touch and pinprick, vibration are decreased in BLE to the level of the knees.  DTRs: Trace in [place], absent in [place]. Toes are downgoing bilaterally. No pathological reflexes.  Coordination: Finger-to-nose and heel-to-shin are without dysmetria bilaterally.    Labs: Lab Results  Component Value Date   WBC 11.5 (H) 04/14/2016   HGB 12.4 (L) 04/14/2016   HCT 38.8 (L) 04/14/2016   PLT 217 04/14/2016   GLUCOSE 114 (H) 04/13/2016   ALT 20 04/13/2016   AST 23 04/13/2016   NA 136 04/13/2016   K 3.6 04/13/2016   CL 107 04/13/2016    CREATININE 1.37 (H) 04/13/2016   BUN 20 04/13/2016   CO2 24 04/13/2016   TSH 4.274 04/04/2016   INR 2.28 04/14/2016   HGBA1C 6.2 (H) 03/07/2016   CBC Latest Ref Rng & Units 04/14/2016 04/13/2016 04/12/2016  WBC 4.0 - 10.5 K/uL 11.5(H) 9.2 9.9  Hemoglobin 13.0 - 17.0 g/dL 12.4(L) 12.1(L) 12.5(L)  Hematocrit 39.0 - 52.0 % 38.8(L) 38.1(L) 39.1  Platelets 150 - 400 K/uL 217 218 217    Lab Results  Component Value Date   HGBA1C 6.2 (H) 03/07/2016   Lab Results  Component Value Date   ALT 20 04/13/2016   AST 23 04/13/2016   ALKPHOS 79 04/13/2016   BILITOT 0.2 (L) 04/13/2016    Radiology: There is no new neuroimaging for review.   A/P:   1. AIDP (Guillain-Barre Syndrome): Presentation and LP compatible with AIDP. He was recently treated for urosepsis and LLE cellulitis but no reported URI, GI illness, or vaccination prior to onset of LE symptoms. He has completed five days of IVIg as of 11/27 with good results and no complications from therapy. No additional recommendations at this time.   2. BLE weakness: This is acute, due to AIDP. He continues to have distal weakness, today more pronounced in the LLE, but this continues to improve. PT, rehab.  3. BLE sensory loss: This is acute, due to AIDP. He has a mild stocking distribution sensory loss in BLE. PT, rehab.   4. Abnormality of gait: This is acute, due to AIDP with resultant weakness and numbness in the LEs. PT, rehab.   This was discussed with the patient and he is in agreement with the plan as noted. He was given the opportunity to ask any questions and these were addressed to his satisfaction. He is hoping to go to rehab soon.   Thank you for the opportunity to participate in his care. I will continue to follow with you. Please call with any questions or concerns.   Melba Coon, MD [facility]

## 2016-04-15 ENCOUNTER — Inpatient Hospital Stay (HOSPITAL_COMMUNITY): Payer: PPO | Admitting: Physical Therapy

## 2016-04-15 ENCOUNTER — Inpatient Hospital Stay (HOSPITAL_COMMUNITY): Payer: PPO | Admitting: Occupational Therapy

## 2016-04-15 DIAGNOSIS — G610 Guillain-Barre syndrome: Principal | ICD-10-CM

## 2016-04-15 DIAGNOSIS — R339 Retention of urine, unspecified: Secondary | ICD-10-CM | POA: Diagnosis not present

## 2016-04-15 LAB — URINALYSIS, ROUTINE W REFLEX MICROSCOPIC
Bilirubin Urine: NEGATIVE
GLUCOSE, UA: NEGATIVE mg/dL
Ketones, ur: NEGATIVE mg/dL
NITRITE: NEGATIVE
PROTEIN: 30 mg/dL — AB
Specific Gravity, Urine: 1.018 (ref 1.005–1.030)
pH: 5 (ref 5.0–8.0)

## 2016-04-15 LAB — URINE MICROSCOPIC-ADD ON

## 2016-04-15 LAB — CBC WITH DIFFERENTIAL/PLATELET
Basophils Absolute: 0.1 K/uL (ref 0.0–0.1)
Basophils Relative: 0 %
Eosinophils Absolute: 0.4 K/uL (ref 0.0–0.7)
Eosinophils Relative: 3 %
HCT: 39.9 % (ref 39.0–52.0)
Hemoglobin: 12.6 g/dL — ABNORMAL LOW (ref 13.0–17.0)
Lymphocytes Relative: 19 %
Lymphs Abs: 2.2 K/uL (ref 0.7–4.0)
MCH: 27.9 pg (ref 26.0–34.0)
MCHC: 31.6 g/dL (ref 30.0–36.0)
MCV: 88.3 fL (ref 78.0–100.0)
Monocytes Absolute: 0.8 K/uL (ref 0.1–1.0)
Monocytes Relative: 7 %
Neutro Abs: 8.3 K/uL — ABNORMAL HIGH (ref 1.7–7.7)
Neutrophils Relative %: 71 %
Platelets: 222 K/uL (ref 150–400)
RBC: 4.52 MIL/uL (ref 4.22–5.81)
RDW: 13.7 % (ref 11.5–15.5)
WBC: 11.7 K/uL — ABNORMAL HIGH (ref 4.0–10.5)

## 2016-04-15 LAB — PROTIME-INR
INR: 2.63
Prothrombin Time: 28.6 s — ABNORMAL HIGH (ref 11.4–15.2)

## 2016-04-15 LAB — COMPREHENSIVE METABOLIC PANEL WITH GFR
ALT: 21 U/L (ref 17–63)
AST: 26 U/L (ref 15–41)
Albumin: 2.3 g/dL — ABNORMAL LOW (ref 3.5–5.0)
Alkaline Phosphatase: 76 U/L (ref 38–126)
Anion gap: 6 (ref 5–15)
BUN: 17 mg/dL (ref 6–20)
CO2: 24 mmol/L (ref 22–32)
Calcium: 8.8 mg/dL — ABNORMAL LOW (ref 8.9–10.3)
Chloride: 103 mmol/L (ref 101–111)
Creatinine, Ser: 1.43 mg/dL — ABNORMAL HIGH (ref 0.61–1.24)
GFR calc Af Amer: 54 mL/min — ABNORMAL LOW (ref >60)
GFR calc non Af Amer: 46 mL/min — ABNORMAL LOW (ref >60)
Glucose, Bld: 105 mg/dL — ABNORMAL HIGH (ref 65–99)
Potassium: 3.9 mmol/L (ref 3.5–5.1)
Sodium: 133 mmol/L — ABNORMAL LOW (ref 135–145)
Total Bilirubin: 0.4 mg/dL (ref 0.3–1.2)
Total Protein: 7.5 g/dL (ref 6.5–8.1)

## 2016-04-15 MED ORDER — WARFARIN SODIUM 7.5 MG PO TABS
7.5000 mg | ORAL_TABLET | Freq: Once | ORAL | Status: AC
  Administered 2016-04-15: 7.5 mg via ORAL
  Filled 2016-04-15: qty 1

## 2016-04-15 MED ORDER — WARFARIN - PHARMACIST DOSING INPATIENT
Freq: Every day | Status: DC
  Administered 2016-04-16 – 2016-04-19 (×2)

## 2016-04-15 NOTE — Progress Notes (Signed)
[place] PHYSICAL MEDICINE & REHABILITATION     PROGRESS NOTE    Subjective/Complaints: No issues overnight. Denies pain at present. Excited to start therapies  ROS: Pt denies fever, rash/itching, headache, blurred or double vision, nausea, vomiting, abdominal pain, diarrhea, chest pain, shortness of breath, palpitations, dysuria, dizziness, neck pain, back pain, bleeding, anxiety, or depression  Objective: Vital Signs: Blood pressure 131/68, pulse 78, temperature 98.1 F (36.7 C), temperature source Oral, resp. rate 17, height 5\' 11"  (1.803 m), weight 72.9 kg (160 lb 11.5 oz), SpO2 96 %. No results found.  Recent Labs  04/14/16 0359 04/15/16 0603  WBC 11.5* 11.7*  HGB 12.4* 12.6*  HCT 38.8* 39.9  PLT 217 222    Recent Labs  04/13/16 0305 04/15/16 0603  NA 136 133*  K 3.6 3.9  CL 107 103  GLUCOSE 114* 105*  BUN 20 17  CREATININE 1.37* 1.43*  CALCIUM 8.6* 8.8*   CBG (last 3)  No results for input(s): GLUCAP in the last 72 hours.  Wt Readings from Last 3 Encounters:  04/15/16 72.9 kg (160 lb 11.5 oz)  04/14/16 99.7 kg (219 lb 14.4 oz)  03/30/16 101.2 kg (223 lb)    Physical Exam:  Constitutional: He is oriented to person, place, and time. He appears well-developed and well-nourished.  HENT:  NCAT.  Mouth/Throat: Oropharynx is clear and moist.  Eyes: PERRL Neck: Normal range of motion. Neck supple.  Cardiovascular: RRR   Respiratory: CTA, no wheezes GI: Soft. Bowel sounds are normal. He exhibits no distension. There is no tenderness.  Musculoskeletal: trace pedal edema   Neurological: He is alert and oriented to person, place, and time.  Voice clear  Sensory loss from feet to above knees (some of this is baseline) DTRs 1+ b/l LE and UE    Motor: RUE/RLE: 4/5 proximal to distal LUE/4/5 proximal to distal  . LLE 4/5 hf,ke and ADF/PF 3/5.  Skin: skin intact GU: foley in place  Psychiatric: pleasant and appropriate  Assessment/Plan: 1. Functional and  mobility deficits secondary to GBS which require 3+ hours per day of interdisciplinary therapy in a comprehensive inpatient rehab setting. Physiatrist is providing close team supervision and 24 hour management of active medical problems listed below. Physiatrist and rehab team continue to assess barriers to discharge/monitor patient progress toward functional and medical goals.  Function:  Bathing Bathing position      Bathing parts      Bathing assist        Upper Body Dressing/Undressing Upper body dressing                    Upper body assist        Lower Body Dressing/Undressing Lower body dressing                                  Lower body assist        Toileting Toileting          Toileting assist     Transfers Chair/bed transfer             Locomotion Ambulation           Wheelchair          Cognition Comprehension    Expression    Social Interaction    Problem Solving    Memory     Medical Problem List and Plan: 1.  Weakness, gait abnormality,  poor activity tolerance secondary to GBS (with history of GBS).  -begin therapies. Pt is motivate 2.  DVT Prophylaxis/Anticoagulation: Pharmaceutical: Coumadin 3. Pain Management: added low dose Neurontin. Tylenol prn effective 4. Mood: Motivated to work hard and get better. LCSW to follow for evaluation and support.  5. Neuropsych: This patient is capable of making decisions on his own behalf. 6. Skin/Wound Care: routine pressure relief measures.  7. Fluids/Electrolytes/Nutrition: Monitor I/O. I personally reviewed the patient's labs today.   -continue to encourage PO. Sodium 133  8. HTN: Monitor BID.  metoprolol and cardizem 9. A fib: in NSR- HR controlled on cardizem, metoprolol and coumadin.   10. Recurrent meningiomas: Being monitored.  11. CKD: Monitor with serial checks. Baseline Cr- 1.4-1.5.   -Cr  1.4 today 12. Urinary retention: Has failed one voiding trial and was  to follow with GU last week.    -consider removing foley tomorrow  -pt would like to try voiding again  -ua ucx pending 13. Leucocytosis:   -up to 11.7 today  -ua ucx  .  LOS (Days) 1 A FACE TO FACE EVALUATION WAS PERFORMED  Brileigh Sevcik T 04/15/2016 8:18 AM

## 2016-04-15 NOTE — Evaluation (Signed)
Occupational Therapy Assessment and Plan  Patient Details  Name: [name] MRN: [id number] Date of Birth: Jul 07, 1940  OT Diagnosis: muscle weakness (generalized) Rehab Potential: Rehab Potential (ACUTE ONLY): Good ELOS: 10-12 days   Today's Date: 04/15/2016 OT Individual Time: 2725-3664 OT Individual Time Calculation (min): 91 min      Problem List: Patient Active Problem List   Diagnosis Date Noted  . GBS (Guillain Barre syndrome) ([place]) 04/14/2016  . Neuropathic pain   . Leukocytosis   . Chronic anticoagulation   . Benign essential HTN   . Stage 3 chronic kidney disease   . PAF (paroxysmal atrial fibrillation) ([place])   . History of meningioma   . Urinary retention   . History of Guillain-Barre syndrome   . Weakness of both lower extremities   . Leg weakness 04/04/2016  . Guillain Barr syndrome ([place]) 04/04/2016  . Cellulitis of left knee 03/12/2016  . Atrial fibrillation, new onset ([place]) 03/07/2016  . Urinary tract infection without hematuria 03/07/2016  . Renal insufficiency 03/07/2016  . Hypokalemia 03/07/2016  . History of CVA (cerebrovascular accident) 03/07/2016  . HTN (hypertension) 03/07/2016  . Atrial fibrillation ([place]) 03/07/2016  . Acute urinary retention   . Occlusion and stenosis of carotid artery without mention of cerebral infarction 07/11/2012  . Carotid stenosis 07/11/2012    Past Medical History:  Past Medical History:  Diagnosis Date  . Atrial fibrillation ([place]) 02/2016   NEW ONSET  . Brain tumor (benign) ([place]) 1998   2 small tumors- since 2015 (Dr. [name] following)  . Carotid artery occlusion   . Cerebrovascular disease September 09, 2001   TIA, Left brain  . CKD (chronic kidney disease), stage III   . Diverticulosis   . ED (erectile dysfunction)   . Guillain-Barre syndrome ([place]) 1999  . Hx of elevated lipids   . Hyperlipidemia   . Hypertension   . Memory loss   . Stroke [facility]) 2003   Past Surgical History:  Past Surgical History:   Procedure Laterality Date  . BRAIN SURGERY  1998   Benign brain tumor removed, Left hemisphere- ? Meningioma  . CAROTID ENDARTERECTOMY Left September 13, 2001   LEFT cea  . CHOLECYSTECTOMY  Nov. 2001  . COLON SURGERY  Feb. 2003   Colonic polyps removed endoscopically  . COLONOSCOPY WITH PROPOFOL N/A 11/11/2015   Procedure: COLONOSCOPY WITH PROPOFOL;  Surgeon: Garlan Fair, MD;  Location: [facility] ENDOSCOPY;  Service: Endoscopy;  Laterality: N/A;  . INGUINAL HERNIA REPAIR  1970's    Assessment & Plan Clinical Impression: Patient is a 75 y.o. year old male with recent admission to the hospital on 04/04/16 with 2-3 day history of progressive BLE numbness with tingling and falls. MRI spine without evidence of significant stenosis. MRI brain with mildly enlarged right paracentral meningioma and stable left clinoid process meningioma. Neurology evaluated patient and exam consistent with significant BLE weakness with areflexia and BUE with hyporeflexia. He was started on IVIG on 11/19 without significant improvement .  Patient transferred to CIR on 04/14/2016 .    Patient currently requires mod with basic self-care skills secondary to muscle weakness, impaired timing and sequencing and unbalanced muscle activation and decreased standing balance, decreased postural control and decreased balance strategies.  Prior to hospitalization, patient could complete  with independent .  Patient will benefit from skilled intervention to decrease level of assist with basic self-care skills and increase independence with basic self-care skills prior to discharge home with care partner.  Anticipate patient  will require 24 hour supervision and follow up home health.  OT - End of Session Activity Tolerance: Decreased this session Endurance Deficit: Yes OT Assessment Rehab Potential (ACUTE ONLY): Good OT Patient demonstrates impairments in the following area(s): Balance;Motor;Endurance;Safety OT Basic ADL's Functional  Problem(s): Grooming;Bathing;Dressing OT Advanced ADL's Functional Problem(s): Simple Meal Preparation OT Transfers Functional Problem(s): Toilet;Tub/Shower OT Additional Impairment(s): None OT Plan OT Intensity: Minimum of 1-2 x/day, 45 to 90 minutes OT Frequency: 5 out of 7 days OT Duration/Estimated Length of Stay: 10-12 days OT Treatment/Interventions: Balance/vestibular training;Community reintegration;Disease mangement/prevention;Discharge planning;Patient/family education;DME/adaptive equipment instruction;Neuromuscular re-education;Psychosocial support;Therapeutic Exercise;UE/LE Coordination activities;Therapeutic Activities;Self Care/advanced ADL retraining   Skilled Therapeutic Intervention Pt worked on bathing and dressing during session.  Pt was able to ambulate to and from the shower with mod assist.  Needs mod demonstrational cueing to stay inside and closer to the walker.  Mod assist for sit to stand transitions with dressing.  Decreased efficiency when attempting to reach his feet for washing and donning socks.  Practiced tub shower transfer with tub bench as pt will have to use tub shower on the first floor.  Discussed ELOS and expected goal attainment at discharge with pt and spouse.  Pt left in wheelchair with spouse present.    OT Evaluation Precautions/Restrictions  Precautions Precautions: Fall  Pain Pain Assessment Pain Assessment: No/denies pain Home Living/Prior Functioning Home Living Available Help at Discharge: Family, Available 24 hours/day Type of Home: House Home Access: Stairs to enter [employer] of Steps: 2 Entrance Stairs-Rails: Left Home Layout: Two level, Able to live on main level with bedroom/bathroom Alternate Level Stairs-Number of Steps: flight Bathroom Shower/Tub: Tub/shower unit (on first level) Armed forces training and education officer: Yes  Lives With: Spouse IADL History Homemaking Responsibilities: No Current  License: Yes Prior Function Level of Independence: Independent with transfers, Independent with basic ADLs, Independent with gait  Able to Take Stairs?: Yes Driving: Yes 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, splits wood ADL  See Function Section of chart  Vision/Perception  Vision- History Baseline Vision/History: Wears glasses Wears Glasses: At all times Patient Visual Report: No change from baseline  Cognition Overall Cognitive Status: Within Functional Limits for tasks assessed Arousal/Alertness: Awake/alert Year: 2017 Month: November Memory: Impaired Immediate Memory Recall: Sock;Blue;Bed Memory Recall: Sock;Blue;Bed Memory Recall Sock: Without Cue Memory Recall Blue: With Cue Memory Recall Bed: Without Cue Attention: Selective;Sustained Sustained Attention: Appears intact Selective Attention: Appears intact Awareness: Appears intact Problem Solving: Appears intact Safety/Judgment: Appears intact Sensation Sensation Light Touch: Appears Intact Stereognosis: Appears Intact Hot/Cold: Appears Intact Proprioception: Appears Intact Additional Comments: Sensation intact in BUEs Coordination Gross Motor Movements are Fluid and Coordinated: Yes Fine Motor Movements are Fluid and Coordinated: Yes Motor  Motor Motor - Skilled Clinical Observations: Pt with BLE weakness Mobility  Bed Mobility Bed Mobility: Supine to Sit Supine to Sit: 4: Min assist;HOB flat Transfers Transfers: Sit to Stand;Stand to Sit Sit to Stand: 3: Mod assist;With armrests;From bed;With upper extremity assist Stand to Sit: 3: Mod assist;With armrests;With upper extremity assist Stand to Sit Details: Pt needing cueing to reach back to chair arms and sit with control.  Trunk/Postural Assessment  Cervical Assessment Cervical Assessment: Within Functional Limits Thoracic Assessment Thoracic Assessment: Exceptions to [employer] [place] (thoracic kyphosis) Lumbar Assessment Lumbar Assessment:  Within Functional Limits Postural Control Postural Control: Deficits on evaluation (flexed trunk in standing)  Balance Balance Balance Assessed: Yes Standardized Balance Assessment Standardized Balance Assessment: Berg Balance Test Berg Balance Test Sit to Stand: Needs  moderate or maximal assist to stand Standing Unsupported: Able to stand 30 seconds unsupported Sitting with Back Unsupported but Feet Supported on Floor or Stool: Able to sit safely and securely 2 minutes Stand to Sit: Needs assistance to sit Transfers: Needs one person to assist Standing Unsupported with Eyes Closed: Needs help to keep from falling Standing Ubsupported with Feet Together: Needs help to attain position and unable to hold for 15 seconds From Standing, Reach Forward with Outstretched Arm: Loses balance while trying/requires external support From Standing Position, Pick up Object from Floor: Unable to try/needs assist to keep balance From Standing Position, Turn to Look Behind Over each Shoulder: Needs assist to keep from losing balance and falling Turn 360 Degrees: Needs assistance while turning Standing Unsupported, Alternately Place Feet on Step/Stool: Needs assistance to keep from falling or unable to try Standing Unsupported, One Foot in Front: Loses balance while stepping or standing Standing on One Leg: Unable to try or needs assist to prevent fall Total Score: 7 Dynamic Sitting Balance Dynamic Sitting - Balance Support: During functional activity Dynamic Sitting - Level of Assistance: 4: Min assist Static Standing Balance Static Standing - Balance Support: During functional activity Dynamic Standing Balance Dynamic Standing - Balance Support: During functional activity;Bilateral upper extremity supported Dynamic Standing - Level of Assistance: 3: Mod assist Extremity/Trunk Assessment RUE Assessment RUE Assessment: Within Functional Limits LUE Assessment LUE Assessment: Within Functional  Limits   See Function Navigator for Current Functional Status.   Refer to Care Plan for Long Term Goals  Recommendations for other services: None  Discharge Criteria: Patient will be discharged from OT if patient refuses treatment 3 consecutive times without medical reason, if treatment goals not met, if there is a change in medical status, if patient makes no progress towards goals or if patient is discharged from hospital.  The above assessment, treatment plan, treatment alternatives and goals were discussed and mutually agreed upon: by patient and by family  , OTR/L 04/15/2016, 5:10 PM

## 2016-04-15 NOTE — Progress Notes (Signed)
Retta Diones, RN Rehab Admission Coordinator Signed Physical Medicine and Rehabilitation  PMR Pre-admission Date of Service: 04/13/2016 2:05 PM  Related encounter: ED to Hosp-Admission (Discharged) from 04/04/2016 in [place]       '[]'$ Hide copied text PMR Admission Coordinator Pre-Admission Assessment  Patient: [name] is an 75 y.o., male MRN: [id number] DOB: [birth year] Height: '5\' 11"'$  (180.3 cm) Weight: 99.7 kg (219 lb 14.4 oz)                                                                                                                                                  Insurance Information HMO:    PPO:      PCP:      IPA:      80/20:      OTHER:  PRIMARY: [employer]      Policy#: [id number]      Subscriber: Big Pool Name: Sharyn Lull      Phone#: [phone]     Fax#: [phone] Pre-Cert#:                   Employer: Retired Benefits:  Phone #: [phone]     Name:  Candelaria Stagers. Date: 05/17/14     Deduct:  $0      Out of Pocket Max: $3400 (met $563.09)      Life Max: unlimited CIR: $225 days 1-6      SNF: $0 days 1-20; $150 days 21-100 Outpatient: medical necessity     Co-Pay: $15/visit Home Health: with authorization      Co-Pay: $25/visit DME: 80%     Co-Pay: 20% Providers: in network  Emergency Contact Information        Contact Information    Name Relation Home Work [place] Spouse [phone]  [phone]   [name], [name] [phone]       Current Medical History  Patient Admitting Diagnosis:  GBS  History of Present Illness: A 75 y.o.malewith history of HTN, CKD, recent diagnosis of PAF, CVA, crani for meningioma resection, recent admissions for urosepsis with urinary retention and left knee cellulitis, h/o GBS who was admitted on 04/04/16 with 2-3 day history of progressive BLE numbness with tingling and falls. MRI spine without evidence of significant stenosis.  MRI brain with mildly enlarged right paracentral meningioma and stable left clinoid process meningioma. Neurology evaluated patient and exam consistent with significant BLE weakness with areflexia and BUE with hyporeflexia. He was started on IVIG on 11/19 without significant improvement. LP 11/22 with elevated levels of glucose and protein with cytoalbuminologic dissociation concerning for GBS and he was started on another round of IVIG. CSF cultures with rare staph that was felt to be contaminant per Dr. Tommy Medal who recommended monitoring at this time. BLE edema treated with diuresis. PT ongoing revealing  diffuse weakness.  With PT needed mod assist +2 for stand pivot transfers.  CIR recommended for follow up therapy.     Past Medical History      Past Medical History:  Diagnosis Date  . Atrial fibrillation ([place]) 02/2016   [place]  . Brain tumor (benign) ([place]) 1998   2 small tumors- since 2015 (Dr. [name] following)  . Carotid artery occlusion   . Cerebrovascular disease September 09, 2001   TIA, Left brain  . CKD (chronic kidney disease), stage III   . Diverticulosis   . ED (erectile dysfunction)   . Guillain-Barre syndrome ([place]) 1999  . Hx of elevated lipids   . Hyperlipidemia   . Hypertension   . Memory loss   . Stroke [facility]) 2003    Family History  family history includes Cancer in his mother; Colon cancer in his mother; Diabetes in his father; Stroke in his father.  Prior Rehab/Hospitalizations: Has recent HHPT for cellulitis through [facility].  Has the patient had major surgery during 100 days prior to admission? No  Current Medications   Current Facility-Administered Medications:  .  acetaminophen (TYLENOL) tablet 650 mg, 650 mg, Oral, Q6H PRN, Lavina Hamman, MD, 650 mg at 04/12/16 1624 .  diltiazem (CARDIZEM CD) 24 hr capsule 120 mg, 120 mg, Oral, Daily, Geradine Girt, DO, 120 mg at 04/14/16 1112 .  diphenhydrAMINE (BENADRYL) capsule 25 mg, 25 mg, Oral, Daily  PRN, Domenic Polite, MD, 25 mg at 04/12/16 1623 .  metoprolol (LOPRESSOR) tablet 50 mg, 50 mg, Oral, BID, Domenic Polite, MD, 50 mg at 04/14/16 1113 .  ondansetron (ZOFRAN) tablet 4 mg, 4 mg, Oral, Q6H PRN **OR** ondansetron (ZOFRAN) injection 4 mg, 4 mg, Intravenous, Q6H PRN, Geradine Girt, DO .  rosuvastatin (CRESTOR) tablet 40 mg, 40 mg, Oral, q1800, Tomi Bamberger Vann, DO, 40 mg at 04/13/16 1846 .  sodium chloride flush (NS) 0.9 % injection 3 mL, 3 mL, Intravenous, Q12H, Jessica U Vann, DO, 3 mL at 04/14/16 1115 .  tamsulosin (FLOMAX) capsule 0.4 mg, 0.4 mg, Oral, QPC supper, Geradine Girt, DO, 0.4 mg at 04/13/16 1846 .  vitamin B-12 (CYANOCOBALAMIN) tablet 1,000 mcg, 1,000 mcg, Oral, Once per day on Wed Sat, Jessica U Vann, DO, 1,000 mcg at 04/14/16 0825 .  warfarin (COUMADIN) tablet 7.5 mg, 7.5 mg, Oral, ONCE-1800, Jake Church Masters, [facility] .  Warfarin - Pharmacist Dosing Inpatient, , Does not apply, q1800, Honor Loh, [facility]  Patients Current Diet: Diet Heart Room service appropriate? Yes; Fluid consistency: Thin Diet - low sodium heart healthy  Precautions / Restrictions Precautions Precautions: Fall Precaution Comments: Only recent h/o falls. Pt independent with steady gait prior to acute illness. Restrictions Weight Bearing Restrictions: No   Has the patient had 2 or more falls or a fall with injury in the past year?Yes.  Has suffered 5 falls this year with no injury.  Prior Activity Level Community (5-7x/wk): Went out daily, was driving.  Home Assistive Devices / Equipment Home Assistive Devices/Equipment: Eyeglasses, Reacher Home Equipment: Walker - 2 wheels, Bedside commode, Toilet riser  Prior Device Use: Indicate devices/aids used by the patient prior to current illness, exacerbation or injury? Walker.  Wife reports patient using walker most recently due to cellulitis.  Prior Functional Level Prior Function Level of Independence: Independent with assistive  device(s) Comments: Pt had recent admission with Lt knee cellulitis and has been using RW since then. Receiving HHPT PTA  Self Care: Did the patient  need help bathing, dressing, using the toilet or eating?  Independent  Indoor Mobility: Did the patient need assistance with walking from room to room (with or without device)? Independent  Stairs: Did the patient need assistance with internal or external stairs (with or without device)? Independent  Functional Cognition: Did the patient need help planning regular tasks such as shopping or remembering to take medications? Independent  Current Functional Level Cognition Overall Cognitive Status: Within Functional Limits for tasks assessed Orientation Level: Oriented X4    Extremity Assessment (includes Sensation/Coordination) Upper Extremity Assessment: Overall WFL for tasks assessed RUE Deficits / Details: AROM WFL; strength 5/5 biceps, triceps, grip RUE Sensation: decreased light touch (along all 5 fingertips) LUE Deficits / Details: AROM WFL; strength 5/5 biceps, triceps, grip LUE Sensation: decreased light touch (along all 5 fingertips)  Lower Extremity Assessment: Defer to PT evaluation (reports tingling in LEs) RLE Deficits / Details: AAROM WFL; hip flexion 3+, hip extension 3+, knee extension 4, ankle DF 4, toe ext 3+ RLE Sensation: decreased light touch, decreased proprioception (circumferential below the knee "50% less") LLE Deficits / Details: AAROM WFL; hip flexion 3, hip extension 3+, knee extension 3+, ankle DF 3+, toe ext 3+ LLE Sensation: decreased light touch, decreased proprioception (circumferential below the knee "50% less")   ADLs Overall ADL's : Needs assistance/impaired Eating/Feeding: Independent, Bed level Grooming: Wash/dry hands, Wash/dry face, Oral care, Sitting, Set up Upper Body Bathing: Minimal assitance, Sitting Lower Body Bathing: Moderate assistance, Sit to/from stand Upper Body Dressing : Minimal  assistance, Sitting Lower Body Dressing: Moderate assistance, Sit to/from stand Lower Body Dressing Details (indicate cue type and reason): pt able to don and doff socks with min assist Toilet Transfer: +2 for safety/equipment, Minimal assistance, Stand-pivot, RW Toileting- Clothing Manipulation and Hygiene: Total assistance, Sit to/from stand Toileting - Clothing Manipulation Details (indicate cue type and reason): unable to release walker in standing   Mobility Overal bed mobility: Needs Assistance Bed Mobility: Supine to Sit Rolling: Supervision Sidelying to sit: Supervision Supine to sit: Min assist, HOB elevated Sit to supine: Supervision General bed mobility comments: Min A to elevate trunk   Transfers Overall transfer level: Needs assistance Equipment used: Rolling walker (2 wheeled) Transfer via Lift Equipment: Stedy Transfers: Sit to/from Stand (From EOB and chair x 2.) Sit to Stand: Mod assist, +2 physical assistance Stand pivot transfers: Min assist, +2 safety/equipment General transfer comment: Mod A + 2 to power up from EOB and chair. Pt LOB x 1 and assistance needed to correct. Verbal cues for hand placement.   Ambulation / Gait / Stairs / Wheelchair Mobility Ambulation/Gait Ambulation/Gait assistance: Mod assist, +2 physical assistance Ambulation Distance (Feet): 20 Feet (Additional trial of 20 after rest break.) Assistive device: Rolling walker (2 wheeled) Gait Pattern/deviations: Step-through pattern, Decreased step length - right, Decreased step length - left, Trunk flexed General Gait Details: Slow, labored gait; pt required rest break O2 saturation was 97%.  Verbal cues for upright posture and assistance needed to propel RW forward and while turning. Gait velocity interpretation: Below normal speed for age/gender   Posture / Balance Dynamic Sitting Balance Sitting balance - Comments: reaches 10 inches outside BOS diagonally fwd Lt and Rt Balance Overall balance  assessment: Needs assistance Sitting-balance support: No upper extremity supported, Feet supported Sitting balance-Leahy Scale: Good Sitting balance - Comments: reaches 10 inches outside BOS diagonally fwd Lt and Rt Standing balance-Leahy Scale: Poor Standing balance comment: Needs continual UE support while standing and during gait.  Special needs/care consideration BiPAP/CPAP No CPM No Continuous Drip IV No Dialysis No       Life Vest No Oxygen No Special Bed No Trach Size No Wound Vac (area) No     Skin Has thin skin, skin tears easily.                            Bowel mgmt: Last documented BM 04/13/16 Bladder mgmt: Foley catheter in place since 03/07/16 secondary to kidney illness.  Had urinary retention and had to have catheter reinserted. Diabetic mgmt No   Previous Home Environment Living Arrangements: Spouse/significant other Available Help at Discharge: Family, Available 24 hours/day Type of Home: House Home Layout: Two level, Able to live on main level with bedroom/bathroom Alternate Level Stairs-Number of Steps: flight Home Access: Stairs to enter Entrance Stairs-Number of Steps: 2 Bathroom Shower/Tub: Gaffer (on second floor) Biochemist, clinical: [place]: No  Discharge Living Setting Plans for Discharge Living Setting: Patient's home, House, Lives with (comment) (Lives with wife.  Has a son living beside them.) Type of Home at Discharge: House Discharge Home Layout: Two level, Bed/bath upstairs, Able to live on main level with bedroom/bathroom Alternate Level Stairs-Number of Steps: Flight Discharge Home Access: Stairs to enter Entrance Stairs-Number of Steps: 2 Does the patient have any problems obtaining your medications?: No  Social/Family/Support Systems Patient Roles: Spouse, Parent (Has a wife and a son.) Contact Information: Cleavon Goldman - spouse Anticipated Caregiver: wife Anticipated Caregiver's Contact Information:  Curt Bears - wife (h) [phone] (c) [phone] Ability/Limitations of Caregiver: Wife can provide supervision. Caregiver Availability: 24/7 Discharge Plan Discussed with Primary Caregiver: Yes Is Caregiver In Agreement with Plan?: Yes Does Caregiver/Family have Issues with Lodging/Transportation while Pt is in Rehab?: No  Goals/Additional Needs Patient/Family Goal for Rehab: PT/OT supervision goals Expected length of stay: 14-17 days Cultural Considerations: None Dietary Needs: Heart diet, thin liquids Equipment Needs: TBD Pt/Family Agrees to Admission and willing to participate: Yes Program Orientation Provided & Reviewed with Pt/Caregiver Including Roles  & Responsibilities: Yes  Decrease burden of Care through IP rehab admission: N/A  Possible need for SNF placement upon discharge: Not planned  Patient Condition: This patient's medical and functional status has changed since the consult dated: 04/12/16 in which the Rehabilitation Physician determined and documented that the patient's condition is appropriate for intensive rehabilitative care in an inpatient rehabilitation facility. See "History of Present Illness" (above) for medical update. Functional changes are:  Currently requiring mod assist for stand pivot transfers. Patient's medical and functional status update has been discussed with the Rehabilitation physician and patient remains appropriate for inpatient rehabilitation. Will admit to inpatient rehab today.  Preadmission Screen Completed By:  Retta Diones, 04/14/2016 3:46 PM ______________________________________________________________________   Discussed status with Dr. Posey Pronto on 04/14/16 at 1546 and received telephone approval for admission today.  Admission Coordinator:  Retta Diones, time1547/Date11/29/17       Cosigned

## 2016-04-15 NOTE — Consult Note (Signed)
   [facility] CM Inpatient Consult   04/15/2016  [name] 07/28/1940 [id number]   [name] has been active with [place] [facility] program. Spoke with [name] at bedside yesterday, prior to discharge to [facility], to make aware that [facility] NP will continue to follow post hospital discharge. Will alert inpatient rehab [employer] [place] [facility] involvement.   Marthenia Rolling, MSN-Ed, RN,BSN [facility] Liaison [phone]

## 2016-04-15 NOTE — Progress Notes (Signed)
Ankit Lorie Phenix, MD Physician Signed Physical Medicine and Rehabilitation  Consult Note Date of Service: 04/12/2016 8:27 AM  Related encounter: ED to Hosp-Admission (Discharged) from 04/04/2016 in [place] Collapse All   [] Hide copied text      Physical Medicine and Rehabilitation Consult   Reason for Consult: GBS Referring Physician: Dr. Broadus John.    HPI: [name] is a 75 y.o. male with history of HTN, CKD, recent diagnosis of PAF, CVA, crani for meningioma resection, recent admissions for urosepsis with urinary retention and left knee cellulitis, h/o GBS who was admitted on 04/04/16 with 2-3 day history of progressive BLE numbness with tingling and falls. MRI spine without evidence of significant stenosis. MRI brain with mildly enlarged right paracentral meningioma and stable left clinoid process meningioma. Neurology evaluated patient and exam consistent with significant BLE weakness with areflexia and BUE with hyporeflexia. He was started on IVIG on 11/19 without significant improvement. LP 11/22  with elevated levels of glucose and protein with cytoalbuminologic dissociation concerning for GBS and he was started on another round of IVIG. CSF cultures with rare staph that was felt to be contaminant per Dr. Tommy Medal who recommended monitoring at this time. BLE edema treated with diuresis. PT ongoing revealing diffuse weakness requiring Stedy to stand. with inability to stand and CIR recommended for follow up therapy.   Was independent without AD till Oct. Works on farm and cuts wood. He failed voiding trial at GU office and foley continues. Wife in good health. They live on a farm and son lives across from them. Family can assist after discharge.    Review of Systems  HENT: Negative for hearing loss and tinnitus.   Eyes: Negative for blurred vision and double vision.  Respiratory: Negative for cough, shortness of  breath and wheezing.   Cardiovascular: Negative for chest pain and palpitations.  Gastrointestinal: Negative for abdominal pain, constipation and heartburn.  Genitourinary: Negative for dysuria.  Musculoskeletal: Positive for joint pain (left hip). Negative for back pain.  Neurological: Positive for sensory change, focal weakness and weakness. Negative for speech change.  Psychiatric/Behavioral: The patient is not nervous/anxious and does not have insomnia.   All other systems reviewed and are negative.         Past Medical History:  Diagnosis Date  . Atrial fibrillation ([place]) 02/2016   NEW ONSET  . Brain tumor (benign) ([place]) 1998   2 small tumors- since 2015 (Dr. [name] following)  . Carotid artery occlusion   . Cerebrovascular disease September 09, 2001   TIA, Left brain  . CKD (chronic kidney disease), stage III   . Diverticulosis   . ED (erectile dysfunction)   . Guillain-Barre syndrome ([place]) 1999  . Hx of elevated lipids   . Hyperlipidemia   . Hypertension   . Memory loss   . Stroke [facility]) 2003         Past Surgical History:  Procedure Laterality Date  . BRAIN SURGERY  1998   Benign brain tumor removed, Left hemisphere- ? Meningioma  . CAROTID ENDARTERECTOMY Left September 13, 2001   LEFT cea  . CHOLECYSTECTOMY  Nov. 2001  . COLON SURGERY  Feb. 2003   Colonic polyps removed endoscopically  . COLONOSCOPY WITH PROPOFOL N/A 11/11/2015   Procedure: COLONOSCOPY WITH PROPOFOL;  Surgeon: Garlan Fair, MD;  Location: [facility] ENDOSCOPY;  Service: Endoscopy;  Laterality: N/A;  . INGUINAL HERNIA REPAIR  1970's  Family History  Problem Relation Age of Onset  . Colon cancer Mother     Metastatic   . Cancer Mother     Colon  . Stroke Father   . Diabetes Father     Social History:  Married. Independent with walker use for past 1.5 months. 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 He reports that he quit  smoking about 19 years ago. His smoking use included Cigarettes. He has never used smokeless tobacco. He reports that he does not drink alcohol or use drugs.        Allergies  Allergen Reactions  . Asa [Aspirin] Other (See Comments)    Severe skin bruising   . Immune Globulins Other (See Comments)    Tetanus Immunes Globulins    (  Pt. Cannot take the Flu shot ) Guillian Barre           Medications Prior to Admission  Medication Sig Dispense Refill  . acetaminophen (TYLENOL) 500 MG tablet Take 1,000 mg by mouth every 6 (six) hours as needed for headache (pain).    . cholecalciferol (VITAMIN D) 1000 units tablet Take 1,000 Units by mouth daily.    Marland Kitchen diltiazem (CARDIZEM CD) 120 MG 24 hr capsule Take 1 capsule (120 mg total) by mouth daily. 30 capsule 0  . furosemide (LASIX) 80 MG tablet Take 40-80 mg by mouth daily as needed for fluid or edema (weight gain of 2 lbs in 24 hours or 5 lbs in a week).     . metoprolol (LOPRESSOR) 50 MG tablet Take 1 tablet (50 mg total) by mouth 2 (two) times daily. 60 tablet 0  . rosuvastatin (CRESTOR) 40 MG tablet Take 40 mg by mouth daily.    . tamsulosin (FLOMAX) 0.4 MG CAPS capsule Take 1 capsule (0.4 mg total) by mouth daily after supper. 30 capsule 0  . vitamin B-12 (CYANOCOBALAMIN) 1000 MCG tablet Take 1,000 mcg by mouth 2 (two) times a week. Wednesday and Saturday    . warfarin (COUMADIN) 5 MG tablet Take 1 tablet (5 mg total) by mouth daily at 6 PM. 30 tablet 0    Home: Home Living Family/patient expects to be discharged to:: Private residence Living Arrangements: Spouse/significant other Available Help at Discharge: Family (wife, son) Type of Home: House Home Access: Stairs to enter Technical brewer of Steps: 2 Home Layout: Two level, Able to live on main level with bedroom/bathroom Home Equipment: Walker - 2 wheels, Bedside commode (walker is borrowed)  Functional History: Prior Function Level of Independence:  Independent with assistive device(s) Comments: Pt had recent admission with Lt knee cellulitis and has been using RW since then. Receiving HHPT PTA Functional Status:  Mobility: Bed Mobility Overal bed mobility: Needs Assistance Bed Mobility: Supine to Sit Rolling: Supervision Sidelying to sit: Supervision Supine to sit: Min assist Sit to supine: Supervision General bed mobility comments: Pt required cues for hand placement and use of pad to advance LEs to edge of bed.    Transfers Overall transfer level: Needs assistance Equipment used: Rolling walker (2 wheeled) Transfer via Lift Equipment: Stedy Transfers: Sit to/from Stand Sit to Stand: Mod assist, +2 physical assistance General transfer comment: Pt able to pull up on stedy bars while PTA and SPTA lifted patient into standing.  Pt performed transfer from bed and BSC.  Pt performed 10 partial sit to stand for strength from stedy frame plates.   Ambulation/Gait Ambulation/Gait assistance:  (unable.  ) General Gait  Details: unable    ADL:    Cognition: Cognition Overall Cognitive Status: Within Functional Limits for tasks assessed Orientation Level: Oriented X4 Cognition Arousal/Alertness: Awake/alert Behavior During Therapy: WFL for tasks assessed/performed Overall Cognitive Status: Within Functional Limits for tasks assessed   Blood pressure (!) 131/55, pulse (!) 57, temperature 98 F (36.7 C), temperature source Oral, resp. rate 20, height 5\' 11"  (1.803 m), weight 99 kg (218 lb 4.1 oz), SpO2 97 %. Physical Exam  Nursing note and vitals reviewed. Constitutional: He is oriented to person, place, and time. He appears well-developed and well-nourished.  HENT:  Head: Normocephalic and atraumatic.  Mouth/Throat: Oropharynx is clear and moist.  Eyes: Conjunctivae and EOM are normal. Pupils are equal, round, and reactive to light.  Neck: Normal range of motion. Neck supple.  Cardiovascular: Normal rate and regular  rhythm.   Respiratory: Effort normal and breath sounds normal. Stridor present. No respiratory distress. He has no wheezes.  GI: Soft. Bowel sounds are normal. He exhibits distension. There is no tenderness.  Musculoskeletal: He exhibits edema (1+  edema BLE). He exhibits no tenderness.  Neurological: He is alert and oriented to person, place, and time.  Minimal dysarthria.  Able to follow basic commands without difficulty.  Sensory deficits b/l plantar feet (baseline) DTRs 1+ RUE with pronator drift with proximal weakness.  Motor: RUE/RLE: 4/5 proximal to distal LUE/LLE: 4-/5 proximal to distal  Skin: Skin is warm and dry. No rash noted. No erythema.  Psychiatric: He has a normal mood and affect. Thought content normal.    Lab Results Last 24 Hours       Results for orders placed or performed during the hospital encounter of 04/04/16 (from the past 24 hour(s))  Protime-INR     Status: Abnormal   Collection Time: 04/12/16  6:55 AM  Result Value Ref Range   Prothrombin Time 19.8 (H) 11.4 - 15.2 seconds   INR 1.66   CBC     Status: Abnormal   Collection Time: 04/12/16  6:55 AM  Result Value Ref Range   WBC 9.9 4.0 - 10.5 K/uL   RBC 4.44 4.22 - 5.81 MIL/uL   Hemoglobin 12.5 (L) 13.0 - 17.0 g/dL   HCT 39.1 39.0 - 52.0 %   MCV 88.1 78.0 - 100.0 fL   MCH 28.2 26.0 - 34.0 pg   MCHC 32.0 30.0 - 36.0 g/dL   RDW 13.2 11.5 - 15.5 %   Platelets 217 150 - 400 K/uL     Imaging Results (Last 48 hours)  No results found.    Assessment/Plan: Diagnosis: GBS Labs and images independently reviewed.  Records reviewed and summated above.  1. Does the need for close, 24 hr/day medical supervision in concert with the patient's rehab needs make it unreasonable for this patient to be served in a less intensive setting? Yes  2. Co-Morbidities requiring supervision/potential complications: HTN (monitor and provide prns in accordance with increased physical exertion and pain),  CKD (avoid nephrotoxic meds), PAF (cont meds, monitor HR with increased physical activity),  History of CVA , crani for meningioma resection, recent admissions for urosepsis, urinary retention (foley per Urology), and left knee cellulitis, h/o GBS 3. Due to bladder management, safety, disease management and patient education, does the patient require 24 hr/day rehab nursing? Yes 4. Does the patient require coordinated care of a physician, rehab nurse, PT (1-2 hrs/day, 5 days/week) and OT (1-2 hrs/day, 5 days/week) to address physical and functional deficits in the context of the  above medical diagnosis(es)? Yes Addressing deficits in the following areas: balance, endurance, locomotion, strength, transferring, dressing, toileting and psychosocial support 5. Can the patient actively participate in an intensive therapy program of at least 3 hrs of therapy per day at least 5 days per week? Yes 6. The potential for patient to make measurable gains while on inpatient rehab is excellent 7. Anticipated functional outcomes upon discharge from inpatient rehab are supervision  with PT, supervision with OT, n/a with SLP. 8. Estimated rehab length of stay to reach the above functional goals is: 14-17 days. 9. Does the patient have adequate social supports and living environment to accommodate these discharge functional goals? Yes 10. Anticipated D/C setting: Home 11. Anticipated post D/C treatments: HH therapy and Home excercise program 12. Overall Rehab/Functional Prognosis: good  RECOMMENDATIONS: This patient's condition is appropriate for continued rehabilitative care in the following setting: CIR Patient has agreed to participate in recommended program. Yes Note that insurance prior authorization may be required for reimbursement for recommended care.  Comment: Rehab Admissions Coordinator to follow up.  Delice Lesch, MD, Mellody Drown 04/12/2016

## 2016-04-15 NOTE — Evaluation (Signed)
Physical Therapy Assessment and Plan  Patient Details  Name: [name] MRN: [id number] Date of Birth: [birth year]  PT Diagnosis: Abnormal posture, Abnormality of gait, Difficulty walking, Impaired sensation and Muscle weakness Rehab Potential: Good ELOS: 12-14 days   Today's Date: 04/15/2016 PT Individual Time: 0800-0900 and 1515-1610 PT Individual Time Calculation (min): 60 min and 55 min    Problem List:  Patient Active Problem List   Diagnosis Date Noted  . GBS (Guillain Barre syndrome) ([place]) 04/14/2016  . Neuropathic pain   . Leukocytosis   . Chronic anticoagulation   . Benign essential HTN   . Stage 3 chronic kidney disease   . PAF (paroxysmal atrial fibrillation) ([place])   . History of meningioma   . Urinary retention   . History of Guillain-Barre syndrome   . Weakness of both lower extremities   . Leg weakness 04/04/2016  . Guillain Barr syndrome ([place]) 04/04/2016  . Cellulitis of left knee 03/12/2016  . Atrial fibrillation, new onset ([place]) 03/07/2016  . Urinary tract infection without hematuria 03/07/2016  . Renal insufficiency 03/07/2016  . Hypokalemia 03/07/2016  . History of CVA (cerebrovascular accident) 03/07/2016  . HTN (hypertension) 03/07/2016  . Atrial fibrillation ([place]) 03/07/2016  . Acute urinary retention   . Occlusion and stenosis of carotid artery without mention of cerebral infarction 07/11/2012  . Carotid stenosis 07/11/2012    Past Medical History:  Past Medical History:  Diagnosis Date  . Atrial fibrillation ([place]) 02/2016   NEW ONSET  . Brain tumor (benign) ([place]) 1998   2 small tumors- since 2015 (Dr. [name] following)  . Carotid artery occlusion   . Cerebrovascular disease September 09, 2001   TIA, Left brain  . CKD (chronic kidney disease), stage III   . Diverticulosis   . ED (erectile dysfunction)   . Guillain-Barre syndrome ([place]) 1999  . Hx of elevated lipids   . Hyperlipidemia   . Hypertension   . Memory loss   . Stroke [facility])  2003   Past Surgical History:  Past Surgical History:  Procedure Laterality Date  . BRAIN SURGERY  1998   Benign brain tumor removed, Left hemisphere- ? Meningioma  . CAROTID ENDARTERECTOMY Left September 13, 2001   LEFT cea  . CHOLECYSTECTOMY  Nov. 2001  . COLON SURGERY  Feb. 2003   Colonic polyps removed endoscopically  . COLONOSCOPY WITH PROPOFOL N/A 11/11/2015   Procedure: COLONOSCOPY WITH PROPOFOL;  Surgeon: Garlan Fair, MD;  Location: [facility];  Service: Endoscopy;  Laterality: N/A;  . INGUINAL HERNIA REPAIR  1970's    Assessment & Plan Clinical Impression: [name] a 75 y.o.malewith history of HTN, CKD, recent diagnosis of PAF, CVA, crani for meningioma resection, recent admissions for urosepsis with urinary retention and left knee cellulitis, h/o GBS who was admitted on 04/04/16 with 2-3 day history of progressive BLE numbness with tingling and falls. MRI spine without evidence of significant stenosis. MRI brain with mildly enlarged right paracentral meningioma and stable left clinoid process meningioma. Neurology evaluated patient and exam consistent with significant BLE weakness with areflexia and BUE with hyporeflexia. He was started on IVIG on 11/19 without significant improvement. LP 11/22 with elevated levels of glucose and protein with cytoalbuminologic dissociation concerning for GBS and he was started on another round of IVIG. CSF cultures with rare staph that was felt to be contaminant per Dr. Tommy Medal who recommended monitoring at this time. BLE edema treated with diuresis. PT ongoing revealing diffuse weakness requiring Stedy  to stand with inability to stand and CIR recommended for follow up therapy.  Patient transferred to CIR on 04/14/2016.   Patient currently requires max with mobility secondary to muscle weakness and muscle joint tightness, decreased cardiorespiratoy endurance, impaired timing and sequencing and unbalanced muscle activation and decreased  standing balance, decreased postural control and decreased balance strategies.  Prior to hospitalization, patient was independent  with mobility and lived with Spouse in a House home.  Home access is 2Stairs to enter.  Patient will benefit from skilled PT intervention to maximize safe functional mobility, minimize fall risk and decrease caregiver burden for planned discharge home with 24 hour supervision.  Anticipate patient will benefit from follow up [place] at discharge.  PT - End of Session Activity Tolerance: Tolerates 30+ min activity with multiple rests Endurance Deficit: Yes Endurance Deficit Description: required seated rest breaks with mobility PT Assessment Rehab Potential (ACUTE/IP ONLY): Good Barriers to Discharge: Inaccessible home environment Barriers to Discharge Comments: steps PT Patient demonstrates impairments in the following area(s): Balance;Endurance;Motor;Nutrition;Safety;Sensory PT Transfers Functional Problem(s): Bed Mobility;Bed to Chair;Car;Furniture PT Locomotion Functional Problem(s): Ambulation;Wheelchair Mobility;Stairs PT Plan PT Intensity: Minimum of 1-2 x/day ,45 to 90 minutes PT Frequency: 5 out of 7 days PT Duration Estimated Length of Stay: 12-14 days PT Treatment/Interventions: Ambulation/gait training;Balance/vestibular training;Community reintegration;Discharge planning;Disease management/prevention;DME/adaptive equipment instruction;Functional mobility training;Neuromuscular re-education;Pain management;Patient/family education;Psychosocial support;Stair training;Therapeutic Activities;Therapeutic Exercise;UE/LE Strength taining/ROM;UE/LE Coordination activities;Wheelchair propulsion/positioning PT Transfers Anticipated Outcome(s): supervision PT Locomotion Anticipated Outcome(s): supervision PT Recommendation Follow Up Recommendations: Home health PT;24 hour supervision/assistance Patient destination: Home Equipment Recommended: To be  determined Equipment Details: owns RW  Skilled Therapeutic Intervention Treatment 1: Skilled therapeutic intervention initiated after completion of evaluation. Discussed with patient falls risk, safety within room, focus of therapy during stay, possible length of stay, goals, and follow-up therapy. Retrieved 18 x 18 wheelchair for improved sitting tolerance. Patient required up to max A for sit <> stand transfers, min A gait x 40 ft, and min-mod A on 3" steps using 2 rails. Patient left sitting in recliner with BLE elevated and needs in reach.   Treatment 2: Patient in wheelchair shaving using electric razor with wife present. Discussed home entry as patient reports storm door opens and blocks use of L rail, patient and wife educated on possible need to install R railing, both verbalized understanding. Patient performed UB/LB dressing with sit <> stand from wheelchair. Patient propelled wheelchair using BUE with supervision and increased time x 100 ft. Ambulatory transfer to simulated sedan height car using RW with max A for sit <> stand. Gait training up/down ramp and across uneven mulched surface using RW with min A and cues for safe use of RW. Patient demonstrates high fall risk as noted by score of 7/56 on Berg Balance Scale. Patient reporting urge for bowel movement, performed squat pivot back to wheelchair with min A and in room stand pivot transfer to toilet using RW with max A. Patient unable to void. Patient ambulated from bathroom > bed approx 25 ft with steady assist and transferred sit > supine with supervision. Patient left semi reclined in bed with all needs within reach.    PT Evaluation Precautions/Restrictions Precautions Precautions: Fall Restrictions Weight Bearing Restrictions: No General Chart Reviewed: Yes Family/Caregiver Present: No  Vital Signs at rest 116/80 and HR 57, after stairs 108/48 and HR 61 Pain  Denied pain Home Living/Prior Functioning Home Living Available  Help at Discharge: Family;Available 24 hours/day Type of Home: House Home Access: Stairs to enter Entrance  Stairs-Number of Steps: 2 Entrance Stairs-Rails: Left (door opens to this side and blocks rail) Home Layout: Two level;Able to live on main level with bedroom/bathroom Bathroom Shower/Tub: Chiropodist: Standard Bathroom Accessibility: Yes  Lives With: Spouse Prior Function Level of Independence: Independent with transfers;Independent with basic ADLs;Independent with gait  Able to Take Stairs?: Yes Driving: Yes 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, splits wood Vision/Perception    No change from baseline Cognition Overall Cognitive Status: Within Functional Limits for tasks assessed Sensation Sensation Light Touch: Impaired Detail Light Touch Impaired Details: Impaired RLE;Impaired LLE Proprioception: Impaired Detail Proprioception Impaired Details: Impaired RLE;Impaired LLE Additional Comments: numbness and tingling in distal BLE Coordination Gross Motor Movements are Fluid and Coordinated: No Fine Motor Movements are Fluid and Coordinated: Yes Motor  Motor Motor: Within Functional Limits  Mobility Bed Mobility Bed Mobility: Supine to Sit Supine to Sit: 4: Min assist;With rails Transfers Transfers: Yes Stand Pivot Transfers: 2: Max assist;With armrests Locomotion  Ambulation Ambulation: Yes Ambulation/Gait Assistance: 4: Min assist Ambulation Distance (Feet): 40 Feet Assistive device: Rolling walker Gait Gait: Yes Gait Pattern: Impaired Gait Pattern: Trunk flexed;Poor foot clearance - left;Poor foot clearance - right;Step-through pattern Gait velocity: decreased Stairs / Additional Locomotion Stairs: Yes Stairs Assistance: 3: Mod assist Stair Management Technique: Two rails;Alternating pattern;Step to pattern;Forwards Number of Stairs: 8 Height of Stairs: 3 Wheelchair Mobility Wheelchair Mobility: Yes Wheelchair  Assistance: 4: Energy manager: Both upper extremities Wheelchair Parts Management: Needs assistance Distance: 200 ft  Trunk/Postural Assessment  Cervical Assessment Cervical Assessment: Within Scientist, physiological Assessment: Exceptions to [facility] (rounded shoulders) Lumbar Assessment Lumbar Assessment: Within Functional Limits Postural Control Postural Control: Deficits on evaluation (impaired )  Balance Balance Balance Assessed: Yes Standardized Balance Assessment Standardized Balance Assessment: Berg Balance Test Berg Balance Test Sit to Stand: Needs moderate or maximal assist to stand Standing Unsupported: Able to stand 30 seconds unsupported Sitting with Back Unsupported but Feet Supported on Floor or Stool: Able to sit safely and securely 2 minutes Stand to Sit: Needs assistance to sit Transfers: Needs one person to assist Standing Unsupported with Eyes Closed: Needs help to keep from falling Standing Ubsupported with Feet Together: Needs help to attain position and unable to hold for 15 seconds From Standing, Reach Forward with Outstretched Arm: Loses balance while trying/requires external support From Standing Position, Pick up Object from Floor: Unable to try/needs assist to keep balance From Standing Position, Turn to Look Behind Over each Shoulder: Needs assist to keep from losing balance and falling Turn 360 Degrees: Needs assistance while turning Standing Unsupported, Alternately Place Feet on Step/Stool: Needs assistance to keep from falling or unable to try Standing Unsupported, One Foot in Front: Loses balance while stepping or standing Standing on One Leg: Unable to try or needs assist to prevent fall Total Score: 7/56 Extremity Assessment  RLE Assessment RLE Assessment: Within Functional Limits (grossly 5/5 hip flexion and knee flex/extension, 4/5 ankle DF/PF) LLE Assessment LLE Assessment: Within Functional Limits  (grossly 5/5 hip flexion and knee flex/extension, 4/5 ankle DF/PF)   See Function Navigator for Current Functional Status.   Refer to Care Plan for Long Term Goals  Recommendations for other services: None  Discharge Criteria: Patient will be discharged from PT if patient refuses treatment 3 consecutive times without medical reason, if treatment goals not met, if there is a change in medical status, if patient makes no progress towards goals or if patient is discharged from  hospital.  The above assessment, treatment plan, treatment alternatives and goals were discussed and mutually agreed upon: by patient  Laretta Alstrom 04/15/2016, 12:58 PM

## 2016-04-15 NOTE — Care Management Note (Signed)
[facility] Individual Statement of Services  Patient Name:  [name]  Date:  04/15/2016  Welcome to the [facility].  Our goal is to provide you with an individualized program based on your diagnosis and situation, designed to meet your specific needs.  With this comprehensive rehabilitation program, you will be expected to participate in at least 3 hours of rehabilitation therapies Monday-Friday, with modified therapy programming on the weekends.  Your rehabilitation program will include the following services:  Physical Therapy (PT), Occupational Therapy (OT), 24 hour per day rehabilitation nursing, Therapeutic Recreaction (TR), Neuropsychology, Case Management (Social Worker), Rehabilitation Medicine, Nutrition Services and Pharmacy Services  Weekly team conferences will be held on Tuesdays to discuss your progress.  Your Social Worker will talk with you frequently to get your input and to update you on team discussions.  Team conferences with you and your family in attendance may also be held.  Expected length of stay: 14 days  Overall anticipated outcome: supervision  Depending on your progress and recovery, your program may change. Your Social Worker will coordinate services and will keep you informed of any changes. Your Social Worker's name and contact numbers are listed  below.  The following services may also be recommended but are not provided by the [facility] will be made to provide these services after discharge if needed.  Arrangements include referral to agencies that provide these services.  Your insurance has been verified to be:  [employer] Your primary doctor is:  Dr. Laurann Montana  Pertinent information will be shared with your doctor and your insurance company.  Social Worker:   G[place], [place] or ([phone]   Information discussed with and copy given to patient by: Lennart Pall, 04/15/2016, 2:58 PM

## 2016-04-15 NOTE — Progress Notes (Signed)
ANTICOAGULATION CONSULT NOTE - Follow Up Consult  Pharmacy Consult for coumadin Indication: afib and CVA  Allergies  Allergen Reactions  . Immune Globulins Other (See Comments)    Tetanus Immunes Globulins    (  Pt. Cannot take the Flu shot ) Guillian Barre   . Asa [Aspirin] Other (See Comments)    Severe skin bruising     Patient Measurements: Height: 5\' 11"  (180.3 cm) Weight: 160 lb 11.5 oz (72.9 kg) IBW/kg (Calculated) : 75.3 Heparin Dosing Weight:   Vital Signs: Temp: 98.1 F (36.7 C) (11/30 0538) Temp Source: Oral (11/30 0538)  Labs:  Recent Labs  04/13/16 0305 04/14/16 0359 04/15/16 0603  HGB 12.1* 12.4* 12.6*  HCT 38.1* 38.8* 39.9  PLT 218 217 222  LABPROT 23.0* 25.5* 28.6*  INR 2.00 2.28 2.63  CREATININE 1.37*  --  1.43*    Estimated Creatinine Clearance: 46 mL/min (by C-G formula based on SCr of 1.43 mg/dL (H)).   Medications:  Scheduled:  . cholecalciferol  1,000 Units Oral Daily  . diltiazem  120 mg Oral Daily  . gabapentin  100 mg Oral QHS  . metoprolol tartrate  50 mg Oral BID  . rosuvastatin  40 mg Oral q1800  . tamsulosin  0.4 mg Oral QPC supper  . [START ON 04/17/2016] vitamin B-12  1,000 mcg Oral Once per day on Wed Sat  . Warfarin - Pharmacist Dosing Inpatient   Does not apply q1800   Infusions:    Assessment: 75 yo male with afib and CVA is currently on therapeutic coumadin.  INR today is 2.63.  Goal of Therapy:  INR 2-3 Monitor platelets by anticoagulation protocol: Yes   Plan:  - coumadin 7.5 mg po x1 - INR in am  Stephine Langbehn, Tsz-Yin 04/15/2016,9:37 AM

## 2016-04-15 NOTE — Progress Notes (Signed)
Patient information reviewed and entered into eRehab system by Nyles Mitton, RN, CRRN, PPS Coordinator.  Information including medical coding and functional independence measure will be reviewed and updated through discharge.     Per nursing patient was given "Data Collection Information Summary for Patients in Inpatient Rehabilitation Facilities with attached "Privacy Act Statement-Health Care Records" upon admission.  

## 2016-04-16 ENCOUNTER — Inpatient Hospital Stay (HOSPITAL_COMMUNITY): Payer: PPO | Admitting: Occupational Therapy

## 2016-04-16 ENCOUNTER — Inpatient Hospital Stay (HOSPITAL_COMMUNITY): Payer: PPO

## 2016-04-16 ENCOUNTER — Encounter (HOSPITAL_COMMUNITY): Payer: Self-pay

## 2016-04-16 ENCOUNTER — Inpatient Hospital Stay (HOSPITAL_COMMUNITY): Payer: PPO | Admitting: Physical Therapy

## 2016-04-16 DIAGNOSIS — D72829 Elevated white blood cell count, unspecified: Secondary | ICD-10-CM

## 2016-04-16 DIAGNOSIS — R339 Retention of urine, unspecified: Secondary | ICD-10-CM | POA: Diagnosis not present

## 2016-04-16 DIAGNOSIS — G610 Guillain-Barre syndrome: Secondary | ICD-10-CM | POA: Diagnosis not present

## 2016-04-16 LAB — PROTIME-INR
INR: 2.95
Prothrombin Time: 31.3 seconds — ABNORMAL HIGH (ref 11.4–15.2)

## 2016-04-16 LAB — URINE CULTURE: CULTURE: NO GROWTH

## 2016-04-16 MED ORDER — WARFARIN SODIUM 5 MG PO TABS
5.0000 mg | ORAL_TABLET | Freq: Once | ORAL | Status: AC
Start: 2016-04-16 — End: 2016-04-16
  Administered 2016-04-16: 5 mg via ORAL
  Filled 2016-04-16: qty 1

## 2016-04-16 NOTE — Progress Notes (Signed)
[facility] PHYSICAL MEDICINE & REHABILITATION     PROGRESS NOTE    Subjective/Complaints: Up with PT working on gait. No new issues. Denies new pain.  ROS: pt denies nausea, vomiting, diarrhea, cough, shortness of breath or chest pain  Objective: Vital Signs: Blood pressure 138/70, pulse 76, temperature 98 F (36.7 C), temperature source Oral, resp. rate 18, height 5\' 11"  (1.803 m), weight 87.9 kg (193 lb 12.6 oz), SpO2 96 %. No results found.  Recent Labs  04/14/16 0359 04/15/16 0603  WBC 11.5* 11.7*  HGB 12.4* 12.6*  HCT 38.8* 39.9  PLT 217 222    Recent Labs  04/15/16 0603  NA 133*  K 3.9  CL 103  GLUCOSE 105*  BUN 17  CREATININE 1.43*  CALCIUM 8.8*   CBG (last 3)  No results for input(s): GLUCAP in the last 72 hours.  Wt Readings from Last 3 Encounters:  04/16/16 87.9 kg (193 lb 12.6 oz)  04/14/16 99.7 kg (219 lb 14.4 oz)  03/30/16 101.2 kg (223 lb)    Physical Exam:  Constitutional: He is oriented to person, place, and time. He appears well-developed and well-nourished.  HENT:  NCAT.  Mouth/Throat: Oropharynx is clear and moist.  Eyes: PERRL Neck: Normal range of motion. Neck supple.  Cardiovascular: RRR   Respiratory: CTA GI: Soft. Bowel sounds are normal. He exhibits no distension. There is no tenderness.  Musculoskeletal: trace pedal edema   Neurological: He is alert and oriented to person, place, and time.   Sensory loss from feet to above knees unchanged DTRs 1+ bilateral LE and UE    Motor: RUE/RLE: 4/5 proximal to distal LUE/4/5 proximal to distal  . LLE 4/5 hf,ke and ADF/PF 3/5.  Skin: skin intact GU: foley in place, urine appears clear Psychiatric: pleasant and appropriate  Assessment/Plan: 1. Functional and mobility deficits secondary to GBS which require 3+ hours per day of interdisciplinary therapy in a comprehensive inpatient rehab setting. Physiatrist is providing close team supervision and 24 hour management of active medical  problems listed below. Physiatrist and rehab team continue to assess barriers to discharge/monitor patient progress toward functional and medical goals.  Function:  Bathing Bathing position   Position: Shower  Bathing parts Body parts bathed by patient: Right arm, Left arm, Chest, Abdomen, Front perineal area, Buttocks, Right upper leg, Left upper leg Body parts bathed by helper: Right lower leg, Left lower leg, Back  Bathing assist        Upper Body Dressing/Undressing Upper body dressing   What is the patient wearing?: Hospital gown                Upper body assist        Lower Body Dressing/Undressing Lower body dressing   What is the patient wearing?: Ted Hose, Non-skid slipper socks         Non-skid slipper socks- Performed by patient: Don/doff right sock Non-skid slipper socks- Performed by helper: Don/doff left sock               TED Hose - Performed by helper: Don/doff right TED hose, Don/doff left TED hose  Lower body assist        Toileting Toileting   Toileting steps completed by patient: Adjust clothing prior to toileting, Performs perineal hygiene Toileting steps completed by helper: Adjust clothing after toileting Toileting Assistive Devices: Grab bar or rail  Toileting assist Assist level: Two helpers   Transfers Chair/bed transfer   Chair/bed transfer method: Stand pivot  Chair/bed transfer assist level: Touching or steadying assistance (Pt > 75%) Chair/bed transfer assistive device: Walker, Air cabin crew     Max distance: 90 Assist level: Touching or steadying assistance (Pt > 75%)   Wheelchair   Type: Manual Max wheelchair distance: 175 Assist Level: Supervision or verbal cues  Cognition Comprehension Comprehension assist level: Follows complex conversation/direction with extra time/assistive device  Expression Expression assist level: Expresses complex ideas: With extra time/assistive device  Social  Interaction Social Interaction assist level: Interacts appropriately with others - No medications needed.  Problem Solving Problem solving assist level: Solves basic 75 - 89% of the time/requires cueing 10 - 24% of the time  Memory Memory assist level: Recognizes or recalls 50 - 74% of the time/requires cueing 25 - 49% of the time   Medical Problem List and Plan: 1.  Weakness, gait abnormality, poor activity tolerance secondary to GBS (with history of GBS).  -continue CIR therapies 2.  DVT Prophylaxis/Anticoagulation: continue Coumadin 3. Pain Management: added low dose Neurontin. Tylenol prn effective 4. Mood: Motivated to work hard and get better. LCSW to follow for evaluation and support.  5. Neuropsych: This patient is capable of making decisions on his own behalf. 6. Skin/Wound Care: routine pressure relief measures.  7. Fluids/Electrolytes/Nutrition: Monitor I/O. I personally reviewed the patient's labs today.   -push po  8. HTN: Monitor BID.  metoprolol and cardizem 9. A fib: in NSR- HR controlled on cardizem, metoprolol and coumadin.   10. Recurrent meningiomas: Being monitored.  11. CKD: Monitor with serial checks. Baseline Cr- 1.4-1.5.   -Cr  1.4 today 12. Urinary retention: Has failed one voiding trial and was to follow with GU last week.    -dc foley today---voiding trial  -ucx negative 13. Leucocytosis:   -up to 11.7 on 11/30---recheck monday  .  LOS (Days) 2 A FACE TO FACE EVALUATION WAS PERFORMED  Kaijah Abts T 04/16/2016 8:53 AM

## 2016-04-16 NOTE — Progress Notes (Signed)
Physical Therapy Session Note  Patient Details  Name: [name] MRN: [id number] Date of Birth: 05/03/1941  Today's Date: 04/16/2016 PT Individual Time: 0800-0900 PT Individual Time Calculation (min): 60 min    Short Term Goals: Week 1:  PT Short Term Goal 1 (Week 1): Patient will perform transfers using RW with min A.  PT Short Term Goal 2 (Week 1): Patient will ambulate 150 ft with min A.  PT Short Term Goal 3 (Week 1): Patient will negotiate up/down 2 stairs using L rail with min A.  PT Short Term Goal 4 (Week 1): Patient will maintain dynamic standing balance x 3 min with min A.  PT Short Term Goal 5 (Week 1): Patient will perform bed mobility with HOB flat with supervision.  Skilled Therapeutic Interventions/Progress Updates:   Pt received supine in bed, denies pain and agreeable to treatment. Supine>sit with HOB elevated and bed rails with S. Squat pivot transfer with minA to w/c. W/c propulsion x175' with BUE for strengthening and endurance; min cues for hand placement for efficiency and shoulder preservation. Stand pivot transfer with RW and minA w/c >mat table. Sit <>stand x5 reps with RW and BUE support; min cues for hand placement on mat to reduce risk of posterior LOB with RW moving during transition. Sit <>stand x5 reps without RW and min guard, reliance of LEs on mat table for support. Gait 2x90' with RW and min guard, mod cues for trunk extension and upright posture with forward gaze. Standing alternating toe taps to 1" step with L HHA and min/modA for balance, with RLE buckling requiring assist to control descent to mat table. Gait x10' to nustep with RW and min guard. Performed nustep x8 min level 6 with BUE/BLE average 55 steps/min; performed for LE strengthening, coordination and endurance. Transfer in/out of bathroom ambulating with RW and min guard. Performed clothing management and hygiene with close S. Remained seated in w/c at end of session, all needs in reach.    Therapy Documentation Precautions:  Precautions Precautions: Fall Restrictions Weight Bearing Restrictions: No Pain: Pain Assessment Pain Assessment: No/denies pain   See Function Navigator for Current Functional Status.   Therapy/Group: Individual Therapy  Luberta Mutter 04/16/2016, 8:42 AM

## 2016-04-16 NOTE — IPOC Note (Signed)
Overall Plan of Care [facility]) Patient Details Name: [name] MRN: [id number] DOB: 07-19-1940  Admitting Diagnosis: GBS  Hospital Problems: Active Problems:   GBS (Guillain Barre syndrome) ([place])     Functional Problem List: Nursing Behavior, Bowel, Bladder, Edema, Medication Management, Nutrition, Pain, Safety, Sensory, Skin Integrity, Motor, Perception, Endurance  PT Balance, Endurance, Motor, Nutrition, Safety, Sensory  OT Balance, Motor, Endurance, Safety  SLP    TR         Basic ADL's: OT Grooming, Bathing, Dressing     Advanced  ADL's: OT Simple Meal Preparation     Transfers: PT Bed Mobility, Bed to Chair, Car, Manufacturing systems engineer, Metallurgist: PT Ambulation, Emergency planning/management officer, Stairs     Additional Impairments: OT None  SLP        TR      Anticipated Outcomes Item Anticipated Outcome  Self Feeding    Swallowing      Basic self-care     Toileting      Bathroom Transfers    Bowel/Bladder  min assist ( foley cath in place)  Transfers  supervision  Locomotion  supervision  Communication     Cognition     Pain  less<3  Safety/Judgment  min assist   Therapy Plan: PT Intensity: Minimum of 1-2 x/day ,45 to 90 minutes PT Frequency: 5 out of 7 days PT Duration Estimated Length of Stay: 12-14 days OT Intensity: Minimum of 1-2 x/day, 45 to 90 minutes OT Frequency: 5 out of 7 days OT Duration/Estimated Length of Stay: 10-12 days         Team Interventions: Nursing Interventions Patient/Family Education, Bladder Management, Bowel Management, Disease Management/Prevention, Pain Management, Medication Management, Skin Care/Wound Management, Cognitive Remediation/Compensation, Discharge Planning, Psychosocial Support  PT interventions Ambulation/gait training, Training and development officer, Community reintegration, Discharge planning, Disease management/prevention, DME/adaptive equipment instruction, Functional mobility  training, Neuromuscular re-education, Pain management, Patient/family education, Psychosocial support, Stair training, Therapeutic Activities, Therapeutic Exercise, UE/LE Strength taining/ROM, UE/LE Coordination activities, Wheelchair propulsion/positioning  OT Interventions Training and development officer, Community reintegration, Disease mangement/prevention, Discharge planning, Patient/family education, DME/adaptive equipment instruction, Neuromuscular re-education, Psychosocial support, Therapeutic Exercise, UE/LE Coordination activities, Therapeutic Activities, Self Care/advanced ADL retraining  SLP Interventions    TR Interventions    SW/CM Interventions Discharge Planning, Psychosocial Support, Patient/Family Education    Team Discharge Planning: Destination: PT-Home ,OT-   , SLP-  Projected Follow-up: PT-Home health PT, 24 hour supervision/assistance, OT-   , SLP-  Projected Equipment Needs: PT-To be determined, OT-  , SLP-  Equipment Details: PT-owns RW, OT-  Patient/family involved in discharge planning: PT- Patient,  OT- , SLP-   MD ELOS: 10-12 days Medical Rehab Prognosis:  Excellent Assessment: The patient has been admitted for CIR therapies with the diagnosis of GBS. The team will be addressing functional mobility, strength, stamina, balance, safety, adaptive techniques and equipment, self-care, bowel and bladder mgt, patient and caregiver education, NMR, activity tolerance, pain control. Goals have been set at supervision to mod I with mobility and self-care. Pt is motivated.    Meredith Staggers, MD, FAAPMR      See Team Conference Notes for weekly updates to the plan of care

## 2016-04-16 NOTE — Progress Notes (Signed)
Occupational Therapy Session Note  Patient Details  Name: [name] MRN: [id number] Date of Birth: 03-29-1941  Today's Date: 04/16/2016  Session 1 OT Individual Time: 0900-1000 OT Individual Time Calculation (min): 60 min   Session 2 OT Individual Time: 1445-1530 OT Individual Time Calculation (min): 45 min    Short Term Goals: Week 1:  OT Short Term Goal 1 (Week 1): Pt will complete LB bathing with supervision sit to stand 2 consecutive sessions.  OT Short Term Goal 2 (Week 1): Pt will perform tub/shower transfers at supervision level.  OT Short Term Goal 3 (Week 1): Pt will perform LB dressing sit to stand with min supervision.  OT Short Term Goal 4 (Week 1): Pt will tolerate standing for 3 mins with unilateral support of UEs at supervision level during grooming tasks.    Skilled Therapeutic Interventions/Progress Updates:    Session 1 1:1 OT session focused on modified bathing/dressing, improved sit<>stands, and activity tolerance. Pt reported he was too tired after PT session to ambulated to shower. Stand-pivot transfer completed w/c<>tub transfer bench w/ Mod A + grab bars. Instructed on modified bathing/dressing strategies- Mod A overall for B/D.  Mod A progressing to Min A sit<>stands w/ Rw, VC for hand placement and safe technique. Pt left seated in w/c at end of session with needs met.   Session 2 1:1 OT session focused on LB dressing, and standing balance/endurance. Pt required Max A to don TED hose with OT providing education on technique. Self-propelled w/c to therapy gym with cues for bilateral UE coordination to facilitate forward motion. Weight shifting, balance strategies, and trunk extension facilitated with dynamic standing activity using velcro board. Min/Mod A for standing balance when reaching outside base of support.  Pt returned to room and left seated in recliner at end of session with needs met.   Therapy Documentation Precautions:   Precautions Precautions: Fall Restrictions Weight Bearing Restrictions: No   Pain: Pain Assessment Pain Assessment: No/denies pain Pain Score: 0-No pain   See Function Navigator for Current Functional Status.   Therapy/Group: Individual Therapy  Valma Cava 04/16/2016, 3:48 PM

## 2016-04-16 NOTE — Progress Notes (Signed)
Social Work  Social Work Assessment and Plan  Patient Details  Name: [name] MRN: [id number] Date of Birth: [birth year]  Today's Date: 04/16/2016  Problem List:  Patient Active Problem List   Diagnosis Date Noted  . GBS (Guillain Barre syndrome) ([place]) 04/14/2016  . Neuropathic pain   . Leukocytosis   . Chronic anticoagulation   . Benign essential HTN   . Stage 3 chronic kidney disease   . PAF (paroxysmal atrial fibrillation) ([place])   . History of meningioma   . Urinary retention   . History of Guillain-Barre syndrome   . Weakness of both lower extremities   . Leg weakness 04/04/2016  . Guillain Barr syndrome ([place]) 04/04/2016  . Cellulitis of left knee 03/12/2016  . Atrial fibrillation, new onset ([place]) 03/07/2016  . Urinary tract infection without hematuria 03/07/2016  . Renal insufficiency 03/07/2016  . Hypokalemia 03/07/2016  . History of CVA (cerebrovascular accident) 03/07/2016  . HTN (hypertension) 03/07/2016  . Atrial fibrillation ([place]) 03/07/2016  . Acute urinary retention   . Occlusion and stenosis of carotid artery without mention of cerebral infarction 07/11/2012  . Carotid stenosis 07/11/2012   Past Medical History:  Past Medical History:  Diagnosis Date  . Atrial fibrillation ([place]) 02/2016   NEW ONSET  . Brain tumor (benign) ([place]) 1998   2 small tumors- since 2015 (Dr. [name] following)  . Carotid artery occlusion   . Cerebrovascular disease September 09, 2001   TIA, Left brain  . CKD (chronic kidney disease), stage III   . Diverticulosis   . ED (erectile dysfunction)   . Guillain-Barre syndrome ([place]) 1999  . Hx of elevated lipids   . Hyperlipidemia   . Hypertension   . Memory loss   . Stroke [facility]) 2003   Past Surgical History:  Past Surgical History:  Procedure Laterality Date  . BRAIN SURGERY  1998   Benign brain tumor removed, Left hemisphere- ? Meningioma  . CAROTID ENDARTERECTOMY Left September 13, 2001   LEFT cea  . CHOLECYSTECTOMY  Nov.  2001  . COLON SURGERY  Feb. 2003   Colonic polyps removed endoscopically  . COLONOSCOPY WITH PROPOFOL N/A 11/11/2015   Procedure: COLONOSCOPY WITH PROPOFOL;  Surgeon: Garlan Fair, MD;  Location: [facility] ENDOSCOPY;  Service: Endoscopy;  Laterality: N/A;  . INGUINAL HERNIA REPAIR  1970's   Social History:  reports that he quit smoking about 19 years ago. His smoking use included Cigarettes. He has never used smokeless tobacco. He reports that he drinks about 0.6 oz of alcohol per week . He reports that he does not use drugs.  Family / Support Systems Marital Status: Married Patient Roles: Spouse, Parent Spouse/Significant Other: wife, Riely Baskett @ [phone] Children: son, Esli Jernigan [facility]) @ ([phone] Anticipated Caregiver: wife Ability/Limitations of Caregiver: Wife can provide supervision. Caregiver Availability: 24/7 Family Dynamics: Pt describes wife and son as very supportive.  Wife encouraging to patient and assists him with completion of assessment with genle corrections at times.    Social History Preferred language: English Religion: Christian Cultural Background: NA Education: HS Read: Yes Write: Yes Employment Status: Retired Date Retired/Disabled/Unemployed: 1998 Freight forwarder Issues: None Guardian/Conservator: None - per MD, pt is capable of making decisions on his own behalf.   Abuse/Neglect Physical Abuse: Denies Verbal Abuse: Denies Sexual Abuse: Denies Exploitation of patient/patient's resources: Denies Self-Neglect: Denies Possible abuse reported to:: Other (Comment)  Emotional Status Pt's affect, behavior adn adjustment status: Pt pleasant and able to complete  assessment with minimal assistance from wife.  Rather flat affect, however, denies any significant emotional distress. Wife denies noting in emotional changes since admission.  Both focused on therapy.  Will monitor and refer to neuropsychology as indicated. Recent  Psychosocial Issues: Wife notes functional decline since health issues began in Oct with kidney and heart issues as well as cellulits in knee.   Pyschiatric History: None Substance Abuse History: None  Patient / Family Perceptions, Expectations & Goals Pt/Family understanding of illness & functional limitations: Pt and wife with very good understanding of diagnosis/ GBS and of current functional limitations / need for CIR. Premorbid pt/family roles/activities: Pt was independent overall, however, but having increase in falls over the past month.  Wife primary caregiver. Anticipated changes in roles/activities/participation: Little change aniticipated is able to reach supervision goals. Pt/family expectations/goals: Pt and wife both agreed that their primary concerns are his falls.  Community [employer] Agencies: None Premorbid Home Care/DME Agencies: Other (Comment) ([place]) Transportation available at discharge: yes  Discharge Planning Living Arrangements: Spouse/significant other Support Systems: Spouse/significant other, Children Type of Residence: Private residence Insurance Resources: Multimedia programmer (specify) ([place]) Financial Resources: [employer] Referred: No Living Expenses: Own Money Management: Spouse Does the patient have any problems obtaining your medications?: No Home Management: mostly wife Patient/Family Preliminary Plans: pt ro return home with wife as primary caregiver Social Work Anticipated Follow Up Needs: HH/OP Expected length of stay: 12-14 days  Clinical Impression Pleasant gentleman here following a return of GBS (1st occurrence 20+ yrs ago). Wife at bedside and very involved and supportive.  Pt motivated for CIR and denies any significant emotional distress.  Will follow for support and d/c planning needs.  [place] Bills 04/16/2016, 7:35 AM

## 2016-04-16 NOTE — Progress Notes (Signed)
ANTICOAGULATION CONSULT NOTE - Follow Up Consult  Pharmacy Consult for coumadin Indication: afib and CVA  Allergies  Allergen Reactions  . Immune Globulins Other (See Comments)    Tetanus Immunes Globulins    (  Pt. Cannot take the Flu shot ) Guillian Barre   . Asa [Aspirin] Other (See Comments)    Severe skin bruising     Patient Measurements: Height: 5\' 11"  (180.3 cm) Weight: 193 lb 12.6 oz (87.9 kg) IBW/kg (Calculated) : 75.3 Heparin Dosing Weight:   Vital Signs: Temp: 98 F (36.7 C) (12/01 0541) Temp Source: Oral (12/01 0541)  Labs:  Recent Labs  04/14/16 0359 04/15/16 0603 04/16/16 0509  HGB 12.4* 12.6*  --   HCT 38.8* 39.9  --   PLT 217 222  --   LABPROT 25.5* 28.6* 31.3*  INR 2.28 2.63 2.95  CREATININE  --  1.43*  --     Estimated Creatinine Clearance: 47.5 mL/min (by C-G formula based on SCr of 1.43 mg/dL (H)).   Medications:  Scheduled:  . cholecalciferol  1,000 Units Oral Daily  . diltiazem  120 mg Oral Daily  . gabapentin  100 mg Oral QHS  . metoprolol tartrate  50 mg Oral BID  . rosuvastatin  40 mg Oral q1800  . tamsulosin  0.4 mg Oral QPC supper  . [START ON 04/17/2016] vitamin B-12  1,000 mcg Oral Once per day on Wed Sat  . Warfarin - Pharmacist Dosing Inpatient   Does not apply q1800   Infusions:    Assessment: 75 yo male with afib and CVA is currently on therapeutic coumadin.  INR today is 2.95   Goal of Therapy:  INR 2-3 Monitor platelets by anticoagulation protocol: Yes   Plan:  - coumadin 5 mg po x1 - INR in am  Maryanna Shape, PharmD, BCPS  Clinical Pharmacist  Pager: [phone]   04/16/2016,1:21 PM

## 2016-04-16 NOTE — Progress Notes (Signed)
Physical Therapy Session Note  Patient Details  Name: [name] MRN: [id number] Date of Birth: 11/17/1940  Today's Date: 04/16/2016 PT Individual Time: 1026-1100 PT Individual Time Calculation (min): 34 min    Short Term Goals: Week 1:  PT Short Term Goal 1 (Week 1): Patient will perform transfers using RW with min A.  PT Short Term Goal 2 (Week 1): Patient will ambulate 150 ft with min A.  PT Short Term Goal 3 (Week 1): Patient will negotiate up/down 2 stairs using L rail with min A.  PT Short Term Goal 4 (Week 1): Patient will maintain dynamic standing balance x 3 min with min A.  PT Short Term Goal 5 (Week 1): Patient will perform bed mobility with HOB flat with supervision.  Skilled Therapeutic Interventions/Progress Updates:    Session focused on functional transfers with focus on technique using RW with min assist and neuro re-ed for balance re-training, coordination, and postural control on compliant surface, heel raises x 10 reps, and on Kinetron in standing (resistance 20 cm/sec) x 2 trials. Pt with tendency for posterior LOB requiring min to mod assist to recover. Transferred back to bed end of session to rest with close supervision.    Therapy Documentation Precautions:  Precautions Precautions: Fall Restrictions Weight Bearing Restrictions: No  Pain: Pain Assessment Pain Assessment: No/denies pain   See Function Navigator for Current Functional Status.   Therapy/Group: Individual Therapy  [name], PT, DPT  04/16/2016, 11:02 AM

## 2016-04-17 DIAGNOSIS — I10 Essential (primary) hypertension: Secondary | ICD-10-CM

## 2016-04-17 DIAGNOSIS — I480 Paroxysmal atrial fibrillation: Secondary | ICD-10-CM

## 2016-04-17 DIAGNOSIS — G610 Guillain-Barre syndrome: Secondary | ICD-10-CM | POA: Diagnosis not present

## 2016-04-17 LAB — PROTIME-INR
INR: 3.42
Prothrombin Time: 35.3 seconds — ABNORMAL HIGH (ref 11.4–15.2)

## 2016-04-17 NOTE — Progress Notes (Signed)
[name] is a 75 y.o. male 1940/08/13 [id number]  Subjective: No new complaints. No new problems. Slept well.   Objective: Vital signs in last 24 hours: Temp:  [98 F (36.7 C)-98.2 F (36.8 C)] 98 F (36.7 C) (12/02 1520) Pulse Rate:  [60] 60 (12/02 2000) Resp:  [16-17] 16 (12/02 2000) BP: (119-130)/(57-61) 126/61 (12/02 2000) SpO2:  [97 %] 97 % (12/02 1520) Weight:  [197 lb 1.5 oz (89.4 kg)] 197 lb 1.5 oz (89.4 kg) (12/02 0555) Weight change: 3 lb 4.9 oz (1.5 kg) Last BM Date: 04/16/16  Intake/Output from previous day: 12/01 0701 - 12/02 0700 In: 1540 [P.O.:1540] Out: 1850 [Urine:1850] Last cbgs: CBG (last 3)  No results for input(s): GLUCAP in the last 72 hours.   Physical Exam General: No apparent distress   HEENT: not dry Lungs: Normal effort. Lungs clear to auscultation, no crackles or wheezes. Cardiovascular: Regular rate and rhythm, no edema Abdomen: S/NT/ND; BS(+) Musculoskeletal:  unchanged Neurological: No new neurological deficits Wounds: N/A    Skin: clear  Aging changes Mental state: Alert, oriented, cooperative    Lab Results: BMET    Component Value Date/Time   NA 133 (L) 04/15/2016 0603   K 3.9 04/15/2016 0603   CL 103 04/15/2016 0603   CO2 24 04/15/2016 0603   GLUCOSE 105 (H) 04/15/2016 0603   BUN 17 04/15/2016 0603   CREATININE 1.43 (H) 04/15/2016 0603   CALCIUM 8.8 (L) 04/15/2016 0603   GFRNONAA 46 (L) 04/15/2016 0603   GFRAA 54 (L) 04/15/2016 0603   CBC    Component Value Date/Time   WBC 11.7 (H) 04/15/2016 0603   RBC 4.52 04/15/2016 0603   HGB 12.6 (L) 04/15/2016 0603   HCT 39.9 04/15/2016 0603   PLT 222 04/15/2016 0603   MCV 88.3 04/15/2016 0603   MCH 27.9 04/15/2016 0603   MCHC 31.6 04/15/2016 0603   RDW 13.7 04/15/2016 0603   LYMPHSABS 2.2 04/15/2016 0603   MONOABS 0.8 04/15/2016 0603   EOSABS 0.4 04/15/2016 0603   BASOSABS 0.1 04/15/2016 0603    Studies/Results: No results found.  Medications: I have  reviewed the patient's current medications.  Assessment/Plan:  1. GBS - I in therapy 2. DVT proph - Coumadin 3. HTN - Metoprolol, Cardizem 4. A fib - Coumadin, Cardizem, Toprol 5. CKD: monitor BMET    Length of stay, days: 3  Walker Kehr , MD 04/17/2016, 10:44 PM

## 2016-04-17 NOTE — Progress Notes (Signed)
ANTICOAGULATION CONSULT NOTE - Follow Up Consult  Pharmacy Consult for Coumadin Indication: afib and CVA  Allergies  Allergen Reactions  . Immune Globulins Other (See Comments)    Tetanus Immunes Globulins    (  Pt. Cannot take the Flu shot ) Guillian Barre   . Asa [Aspirin] Other (See Comments)    Severe skin bruising     Patient Measurements: Height: 5\' 11"  (180.3 cm) Weight: 197 lb 1.5 oz (89.4 kg) IBW/kg (Calculated) : 75.3  Vital Signs: Temp: 98.2 F (36.8 C) (12/02 0555) Temp Source: Oral (12/02 0555) BP: 130/57 (12/02 1045)  Labs:  Recent Labs  04/15/16 0603 04/16/16 0509 04/17/16 0615  HGB 12.6*  --   --   HCT 39.9  --   --   PLT 222  --   --   LABPROT 28.6* 31.3* 35.3*  INR 2.63 2.95 3.42  CREATININE 1.43*  --   --     Estimated Creatinine Clearance: 47.5 mL/min (by C-G formula based on SCr of 1.43 mg/dL (H)).   Medications:  Scheduled:  . cholecalciferol  1,000 Units Oral Daily  . diltiazem  120 mg Oral Daily  . gabapentin  100 mg Oral QHS  . metoprolol tartrate  50 mg Oral BID  . rosuvastatin  40 mg Oral q1800  . tamsulosin  0.4 mg Oral QPC supper  . vitamin B-12  1,000 mcg Oral Once per day on Wed Sat  . Warfarin - Pharmacist Dosing Inpatient   Does not apply q1800   Infusions:    Assessment: 75 yo male with afib and CVA is currently on Coumadin.  INR is trending up 2.6 > 2.9 > 3.4 after increased Coumadin doses.  Will hold dose tonight.  No bleeding noted.  Goal of Therapy:  INR 2-3 Monitor platelets by anticoagulation protocol: Yes   Plan:  No Coumadin tonight Daily INR  [employer], Pharm.D., BCPS Clinical Pharmacist 04/17/2016 10:57 AM

## 2016-04-18 ENCOUNTER — Inpatient Hospital Stay (HOSPITAL_COMMUNITY): Payer: PPO | Admitting: Physical Therapy

## 2016-04-18 ENCOUNTER — Inpatient Hospital Stay (HOSPITAL_COMMUNITY): Payer: PPO

## 2016-04-18 DIAGNOSIS — G610 Guillain-Barre syndrome: Secondary | ICD-10-CM | POA: Diagnosis not present

## 2016-04-18 DIAGNOSIS — I480 Paroxysmal atrial fibrillation: Secondary | ICD-10-CM | POA: Diagnosis not present

## 2016-04-18 DIAGNOSIS — I10 Essential (primary) hypertension: Secondary | ICD-10-CM | POA: Diagnosis not present

## 2016-04-18 LAB — PROTIME-INR
INR: 3.19
PROTHROMBIN TIME: 33.4 s — AB (ref 11.4–15.2)

## 2016-04-18 MED ORDER — WARFARIN SODIUM 5 MG PO TABS
2.5000 mg | ORAL_TABLET | Freq: Once | ORAL | Status: AC
  Administered 2016-04-18: 2.5 mg via ORAL
  Filled 2016-04-18: qty 1

## 2016-04-18 NOTE — Plan of Care (Signed)
Problem: RH BLADDER ELIMINATION Goal: RH STG MANAGE BLADDER WITH ASSISTANCE STG Manage Bladder With Assistance   Outcome: Not Progressing Patient requires straight cath q 4 hrs

## 2016-04-18 NOTE — Progress Notes (Signed)
Occupational Therapy Session Note  Patient Details  Name: [name] MRN: [id number] Date of Birth: 10-Sep-1940  Today's Date: 04/18/2016 OT Individual Time:  -      Short Term Goals: Week 1:  OT Short Term Goal 1 (Week 1): Pt will complete LB bathing with supervision sit to stand 2 consecutive sessions.  OT Short Term Goal 2 (Week 1): Pt will perform tub/shower transfers at supervision level.  OT Short Term Goal 3 (Week 1): Pt will perform LB dressing sit to stand with min supervision.  OT Short Term Goal 4 (Week 1): Pt will tolerate standing for 3 mins with unilateral support of UEs at supervision level during grooming tasks.    Skilled Therapeutic Interventions/Progress Updates:     Therapy Documentation Precautions:  Precautions Precautions: Fall Restrictions Weight Bearing Restrictions: No   General:     Vital Signs: Therapy Vitals Temp: 97.8 F (36.6 C) Temp Source: Oral Resp: 16 Oxygen Therapy SpO2: 92 % O2 Device: Not Delivered   Pain:     ADL:     Exercises:     Other Treatments:    See Function Navigator for Current Functional Status.   Therapy/Group: Individual Therapy   Second session: Time:  Time Calculation (min):    min  Pain Assessment:    Skilled Therapeutic Interventions:   See FIM for current functional status  Therapy/Group: Individual Therapy  Emika Tiano 04/18/2016, 7:49 AM

## 2016-04-18 NOTE — Progress Notes (Signed)
Occupational Therapy Session Note  Patient Details  Name: Cayetano Mikita MRN: [id number] Date of Birth: 06/23/1940  Today's Date: 04/18/2016 OT Individual Time: 1100-1200o  OT Individual Time Calculation (min): 60 min   Short Term Goals: Week 1:  OT Short Term Goal 1 (Week 1): Pt will complete LB bathing with supervision sit to stand 2 consecutive sessions.  OT Short Term Goal 2 (Week 1): Pt will perform tub/shower transfers at supervision level.  OT Short Term Goal 3 (Week 1): Pt will perform LB dressing sit to stand with min supervision.  OT Short Term Goal 4 (Week 1): Pt will tolerate standing for 3 mins with unilateral support of UEs at supervision level during grooming tasks.    Skilled Therapeutic Interventions/Progress Updates:   ADL-retraining at shower level with focus on improved endurance, sit<>stand, standing balance, transfers, functional mobility using RW, and adapted bathing/dressing skills.   Pt received seated in w/c and awaiting therapist in prep for am BADL session.  With setup to place RW, min instructional cues and contact guard for safety, pt rose to RW and ambulated to bathroom, approx 15', requiring just one vc for correction to posture.   Pt transferred to tub bench with steadying assist and undressed seated with extra time.   Pt progressed through bathing, requiring assist only to wash his back and re-ed on use of hand shower.   Pt recovered to w/c after bathing and was escorted to sink to groom and dress, using counter for support.   Pt required only setup for use of brief, min assist to adjust brief and setup to place TEDs.    Pt groomed with 75% accuracy during shaving and dressed with overall min assist while standing supported at sink.   Pt recovered to recliner at end of session with all needs placed within reach.   Pt reported mild fatigue and LE weakness after BADL session.  Therapy Documentation Precautions:  Precautions Precautions:  Fall Restrictions Weight Bearing Restrictions: No   Vital Signs: Therapy Vitals Pulse Rate: 60 BP: (!) 116/56   Pain: Pain Assessment Pain Assessment: No/denies pain   See Function Navigator for Current Functional Status.   Therapy/Group: Individual Therapy   Second session: Time: 0017-4944 Time Calculation (min):  45  min  Pain Assessment: No/Denies pain    Skilled Therapeutic Interventions: Therapeutic activity with focus on endurance, standing balance, functional mobility using RW.   Pt received seated in recliner, receptive for continued treatment.  With setup to place RW, pt ambulated to rehab gym, approx 160' with 1 rest break and intermittent cues to improve posture to reduce forward lean/head tilt and relax his shoulders.   Pt completed transfer to NuStep for light warm-up activity to increase knee flexion and enhance standing balance performance.   After setup to calibrate balance board for pt, pt performed standing balance task for 18 minutes using Nintendo Wii balance board and selected activities with 3 rest breaks required maintain participation.   Pt was able to complete balance awareness task and table tilt with overall min assist to weight-shift and extra time to orient to feeback provided during task to enable self-correction.   Pt recovered to w/c at end of session and was escorted back to his room to transfer to recliner with standby assist only.  See FIM for current functional status  Therapy/Group: Individual Therapy  [place] 04/18/2016, 12:18 PM

## 2016-04-18 NOTE — Progress Notes (Signed)
ANTICOAGULATION CONSULT NOTE - Follow Up Consult  Pharmacy Consult for Coumadin Indication: afib and CVA  Allergies  Allergen Reactions  . Immune Globulins Other (See Comments)    Tetanus Immunes Globulins    (  Pt. Cannot take the Flu shot ) Guillian Barre   . Asa [Aspirin] Other (See Comments)    Severe skin bruising     Patient Measurements: Height: 5\' 11"  (180.3 cm) Weight: 210 lb 1.6 oz (95.3 kg) IBW/kg (Calculated) : 75.3  Vital Signs: Temp: 97.8 F (36.6 C) (12/03 0544) Temp Source: Oral (12/03 0544) BP: 116/56 (12/03 1015) Pulse Rate: 60 (12/03 1011)  Labs:  Recent Labs  04/16/16 0509 04/17/16 0615 04/18/16 0449  LABPROT 31.3* 35.3* 33.4*  INR 2.95 3.42 3.19    Estimated Creatinine Clearance: 52.6 mL/min (by C-G formula based on SCr of 1.43 mg/dL (H)).   Medications:  Scheduled:  . cholecalciferol  1,000 Units Oral Daily  . diltiazem  120 mg Oral Daily  . gabapentin  100 mg Oral QHS  . metoprolol tartrate  50 mg Oral BID  . rosuvastatin  40 mg Oral q1800  . tamsulosin  0.4 mg Oral QPC supper  . vitamin B-12  1,000 mcg Oral Once per day on Wed Sat  . warfarin  2.5 mg Oral ONCE-1800  . Warfarin - Pharmacist Dosing Inpatient   Does not apply q1800   Infusions:    Assessment: 75 yo male with afib and CVA is currently on Coumadin.  INR is trending up 2.6 > 2.9 > 3.4 after increased Coumadin doses.  Dose held 12/2 and INR trending down to near goal.  Will give low dose tonight.  No bleeding noted.  Goal of Therapy:  INR 2-3 Monitor platelets by anticoagulation protocol: Yes   Plan:  Coumadin 2.5 mg Po x 1 tonight Daily INR  [employer], Pharm.D., BCPS Clinical Pharmacist 04/18/2016 11:19 AM

## 2016-04-18 NOTE — Progress Notes (Signed)
Axcel Horsch is a 75 y.o. male 1940/06/26 [id number]  Subjective: No new complaints. No new problems. Slept well. Feeling OK.  Objective: Vital signs in last 24 hours: Temp:  [97.5 F (36.4 C)-97.8 F (36.6 C)] 97.5 F (36.4 C) (12/03 1505) Pulse Rate:  [55-60] 60 (12/03 2045) Resp:  [16-18] 18 (12/03 1505) BP: (116-156)/(56-64) 131/63 (12/03 2045) SpO2:  [92 %-97 %] 97 % (12/03 1505) Weight:  [210 lb 1.6 oz (95.3 kg)] 210 lb 1.6 oz (95.3 kg) (12/03 0544) Weight change: 13 lb 0.1 oz (5.9 kg) Last BM Date: 04/16/16  Intake/Output from previous day: 12/02 0701 - 12/03 0700 In: 840 [P.O.:840] Out: 2800 [Urine:2800] Last cbgs: CBG (last 3)  No results for input(s): GLUCAP in the last 72 hours.   Physical Exam General: No apparent distress   HEENT: not dry Lungs: Normal effort. Lungs clear to auscultation, no crackles or wheezes. Cardiovascular: Regular rate and rhythm, no edema Abdomen: S/NT/ND; BS(+) Musculoskeletal:  Unchanged - w/c Neurological: No new neurological deficits Wounds: N/A    Skin: clear  Aging changes Mental state: Alert, oriented, cooperative    Lab Results: BMET    Component Value Date/Time   NA 133 (L) 04/15/2016 0603   K 3.9 04/15/2016 0603   CL 103 04/15/2016 0603   CO2 24 04/15/2016 0603   GLUCOSE 105 (H) 04/15/2016 0603   BUN 17 04/15/2016 0603   CREATININE 1.43 (H) 04/15/2016 0603   CALCIUM 8.8 (L) 04/15/2016 0603   GFRNONAA 46 (L) 04/15/2016 0603   GFRAA 54 (L) 04/15/2016 0603   CBC    Component Value Date/Time   WBC 11.7 (H) 04/15/2016 0603   RBC 4.52 04/15/2016 0603   HGB 12.6 (L) 04/15/2016 0603   HCT 39.9 04/15/2016 0603   PLT 222 04/15/2016 0603   MCV 88.3 04/15/2016 0603   MCH 27.9 04/15/2016 0603   MCHC 31.6 04/15/2016 0603   RDW 13.7 04/15/2016 0603   LYMPHSABS 2.2 04/15/2016 0603   MONOABS 0.8 04/15/2016 0603   EOSABS 0.4 04/15/2016 0603   BASOSABS 0.1 04/15/2016 0603    Studies/Results: No results  found.  Medications: I have reviewed the patient's current medications.  Assessment/Plan:  1.GBS - cont w/therapy 2. DVT prophylaxis w/Coumadin 3. HTN: cont w/Metoprolol and Cardizem 4. A fib: Cardizem, Toprol, Coumadin 5. CKD      Length of stay, days: 4  Walker Kehr , MD 04/18/2016, 9:36 PM

## 2016-04-18 NOTE — Progress Notes (Signed)
Physical Therapy Session Note  Patient Details  Name: [name] MRN: [id number] Date of Birth: 1940/05/24  Today's Date: 04/18/2016 PT Individual Time: 0915-1000 and 1430-1500 PT Individual Time Calculation (min): 45 min and 30 min (total 75 min)    Short Term Goals: Week 1:  PT Short Term Goal 1 (Week 1): Patient will perform transfers using RW with min A.  PT Short Term Goal 2 (Week 1): Patient will ambulate 150 ft with min A.  PT Short Term Goal 3 (Week 1): Patient will negotiate up/down 2 stairs using L rail with min A.  PT Short Term Goal 4 (Week 1): Patient will maintain dynamic standing balance x 3 min with min A.  PT Short Term Goal 5 (Week 1): Patient will perform bed mobility with HOB flat with supervision.  Skilled Therapeutic Interventions/Progress Updates:  Tx 1: Pt missed 15 min PT due to nursing care to perform urinary catheterization. Pt received supine in bed, denies pain and agreeable to treatment. Supine>sit with S, bedrails and increased time. Gait in/out of bathroom with RW and close S. Performed management of shorts with S; required assist for managing brief and performing hygiene. When ambulating out of bathroom, pt reports R knee pain and sensation of "giving out". Seated at sink pt performed hand washing, teeth brushing, and brushing hair with modI. W/c propulsion x175' with BUE for strengthening and endurance. Stand pivot transfer w/c <>nustep with close S. Performed nustep at level 5 x9 min with BUE/BLE for strengthening, coordination, aerobic endurance. Gait with min guard using RW x90' and x115', min cues for upright posture, forward gaze. Remained seated in w/c at end of session, all needs in reach.   Tx 2: Pt received seated in recliner, denies pain and agreeable to treatment. Stand pivot transfer recliner <>w/c with min guard; repetitive cues for upright posture and eccentric control. Standing balance on foam wedge for gastroc/soleus stretch and anterior tib  activation for ankle strategy with posterior LOB; performed while playing horseshoes for dynamic balance challenge and anticipatory postural adjustments. Supine hip flexor stretch on R side; ineffective for sufficient ROM due to pt rigidity and difficulty relaxing. Returned to room totalA for energy conservation; stand pivot to return to bed with min guard as above. Remained supine in bed with alarm intact and all needs in reach at end of session.   Therapy Documentation Precautions:  Precautions Precautions: Fall Restrictions Weight Bearing Restrictions: No General: PT Amount of Missed Time (min): 15 Minutes PT Missed Treatment Reason: Nursing care    See Function Navigator for Current Functional Status.   Therapy/Group: Individual Therapy  Luberta Mutter 04/18/2016, 10:15 AM

## 2016-04-19 ENCOUNTER — Inpatient Hospital Stay (HOSPITAL_COMMUNITY): Payer: PPO | Admitting: Occupational Therapy

## 2016-04-19 ENCOUNTER — Inpatient Hospital Stay (HOSPITAL_COMMUNITY): Payer: PPO | Admitting: Physical Therapy

## 2016-04-19 DIAGNOSIS — N319 Neuromuscular dysfunction of bladder, unspecified: Secondary | ICD-10-CM | POA: Diagnosis not present

## 2016-04-19 DIAGNOSIS — G610 Guillain-Barre syndrome: Secondary | ICD-10-CM | POA: Diagnosis not present

## 2016-04-19 LAB — CBC
HEMATOCRIT: 38 % — AB (ref 39.0–52.0)
Hemoglobin: 12.1 g/dL — ABNORMAL LOW (ref 13.0–17.0)
MCH: 28 pg (ref 26.0–34.0)
MCHC: 31.8 g/dL (ref 30.0–36.0)
MCV: 88 fL (ref 78.0–100.0)
PLATELETS: 208 10*3/uL (ref 150–400)
RBC: 4.32 MIL/uL (ref 4.22–5.81)
RDW: 13.9 % (ref 11.5–15.5)
WBC: 10.2 10*3/uL (ref 4.0–10.5)

## 2016-04-19 LAB — PROTIME-INR
INR: 2.91
Prothrombin Time: 31 seconds — ABNORMAL HIGH (ref 11.4–15.2)

## 2016-04-19 MED ORDER — BETHANECHOL CHLORIDE 25 MG PO TABS
25.0000 mg | ORAL_TABLET | Freq: Three times a day (TID) | ORAL | Status: DC
  Administered 2016-04-19 (×3): 25 mg via ORAL
  Filled 2016-04-19 (×3): qty 1

## 2016-04-19 MED ORDER — PRO-STAT SUGAR FREE PO LIQD
30.0000 mL | Freq: Two times a day (BID) | ORAL | Status: DC
  Administered 2016-04-19 – 2016-04-28 (×19): 30 mL via ORAL
  Filled 2016-04-19 (×19): qty 30

## 2016-04-19 MED ORDER — LIDOCAINE HCL 2 % EX GEL
1.0000 "application " | CUTANEOUS | Status: DC | PRN
  Administered 2016-04-19: 1 via URETHRAL
  Filled 2016-04-19: qty 5

## 2016-04-19 MED ORDER — WARFARIN SODIUM 5 MG PO TABS
2.5000 mg | ORAL_TABLET | Freq: Once | ORAL | Status: AC
  Administered 2016-04-19: 2.5 mg via ORAL
  Filled 2016-04-19: qty 1

## 2016-04-19 NOTE — Progress Notes (Signed)
[place] PHYSICAL MEDICINE & REHABILITATION     PROGRESS NOTE    Subjective/Complaints: Had difficulty with passing of cath to bladder, uncomfortable, with some bleeding. No spontaneous voids over weekend  ROS: pt denies nausea, vomiting, diarrhea, cough, shortness of breath or chest pain   Objective: Vital Signs: Blood pressure (!) 107/55, pulse (!) 55, temperature 98.2 F (36.8 C), temperature source Oral, resp. rate 18, height 5\' 11"  (1.803 m), weight 95 kg (209 lb 7 oz), SpO2 95 %. No results found.  Recent Labs  04/19/16 0509  WBC 10.2  HGB 12.1*  HCT 38.0*  PLT 208   No results for input(s): NA, K, CL, GLUCOSE, BUN, CREATININE, CALCIUM in the last 72 hours.  Invalid input(s): CO CBG (last 3)  No results for input(s): GLUCAP in the last 72 hours.  Wt Readings from Last 3 Encounters:  04/19/16 95 kg (209 lb 7 oz)  04/14/16 99.7 kg (219 lb 14.4 oz)  03/30/16 101.2 kg (223 lb)    Physical Exam:  Constitutional: He is oriented to person, place, and time. He appears well-developed and well-nourished.  HENT:  NCAT.  Mouth/Throat: Oropharynx is clear and moist.  Eyes: PERRL Neck: Normal range of motion. Neck supple.  Cardiovascular: FFF  Respiratory: cta GI: Soft. Bowel sounds are normal. He exhibits no distension. There is no tenderness.  Musculoskeletal: trace pedal edema still present. Wearing teds  Neurological: He is alert and oriented to person, place, and time.   Sensory loss from kees to feet DTRs 1+ bilateral LE and UE    Motor: RUE/RLE: 4/5 proximal to distal LUE/4/5 proximal to distal  . LLE 4/5 hf,ke and ADF/PF 3/5.  Skin: skin intact GU: no breakdown around penis Psychiatric: pleasant and appropriate  Assessment/Plan: 1. Functional and mobility deficits secondary to GBS which require 3+ hours per day of interdisciplinary therapy in a comprehensive inpatient rehab setting. Physiatrist is providing close team supervision and 24 hour management  of active medical problems listed below. Physiatrist and rehab team continue to assess barriers to discharge/monitor patient progress toward functional and medical goals.  Function:  Bathing Bathing position   Position: Shower  Bathing parts Body parts bathed by patient: Left arm, Right arm, Chest, Abdomen, Front perineal area, Buttocks, Right upper leg, Left upper leg, Right lower leg, Left lower leg Body parts bathed by helper: Back  Bathing assist Assist Level: Supervision or verbal cues      Upper Body Dressing/Undressing Upper body dressing   What is the patient wearing?: (P) Pull over shirt/dress     Pull over shirt/dress - Perfomed by patient: (P) Thread/unthread right sleeve, Thread/unthread left sleeve, Put head through opening, Pull shirt over trunk          Upper body assist Assist Level: (P) More than reasonable time   Set up : To obtain clothing/put away  Lower Body Dressing/Undressing Lower body dressing   What is the patient wearing?: (P) Pants, Non-skid slipper socks, Ted Hose Underwear - Performed by patient: Thread/unthread right underwear leg, Thread/unthread left underwear leg Underwear - Performed by helper: Pull underwear up/down Pants- Performed by patient: (P) Thread/unthread right pants leg, Thread/unthread left pants leg, Pull pants up/down, Fasten/unfasten pants Pants- Performed by helper: Thread/unthread left pants leg, Pull pants up/down, Thread/unthread right pants leg Non-skid slipper socks- Performed by patient: (P) Don/doff right sock, Don/doff left sock Non-skid slipper socks- Performed by helper: Don/doff left sock  TED Hose - Performed by helper: (P) Don/doff right TED hose, Don/doff left TED hose  Lower body assist Assist for lower body dressing: (P) Touching or steadying assistance (Pt > 75%)   Set up : (P) Don/doff TED stockings  Toileting Toileting Toileting activity did not occur: Refused Toileting steps completed  by patient: Adjust clothing prior to toileting, Adjust clothing after toileting Toileting steps completed by helper: Adjust clothing prior to toileting, Performs perineal hygiene, Adjust clothing after toileting Toileting Assistive Devices: Grab bar or rail  Toileting assist Assist level: Touching or steadying assistance (Pt.75%)   Transfers Chair/bed transfer   Chair/bed transfer method: Ambulatory Chair/bed transfer assist level: Supervision or verbal cues Chair/bed transfer assistive device: Bedrails, Armrests, Medical sales representative     Max distance: 115 Assist level: Touching or steadying assistance (Pt > 75%)   Wheelchair   Type: Manual Max wheelchair distance: 175 Assist Level: Supervision or verbal cues  Cognition Comprehension Comprehension assist level: Follows complex conversation/direction with extra time/assistive device  Expression Expression assist level: Expresses complex ideas: With extra time/assistive device  Social Interaction Social Interaction assist level: Interacts appropriately with others with medication or extra time (anti-anxiety, antidepressant).  Problem Solving Problem solving assist level: Solves basic problems with no assist  Memory Memory assist level: Requires cues to use assistive device   Medical Problem List and Plan: 1.  Weakness, gait abnormality, poor activity tolerance secondary to GBS (with history of GBS).  -continue CIR therapies 2.  DVT Prophylaxis/Anticoagulation:  Coumadin 3. Pain Management: added low dose Neurontin. Tylenol prn effective 4. Mood: Motivated to work hard and get better. LCSW to follow for evaluation and support.  5. Neuropsych: This patient is capable of making decisions on his own behalf. 6. Skin/Wound Care: routine pressure relief measures.  7. Fluids/Electrolytes/Nutrition: Monitor I/O. I personally reviewed the patient's labs today.   -push po  8. HTN: Monitor BID.  metoprolol and cardizem 9. A fib:  in NSR- HR remains controlled on cardizem, metoprolol and coumadin.   10. Recurrent meningiomas: Being monitored.  11. CKD: Monitor with serial checks. Baseline Cr- 1.4-1.5.   -  12. Urinary retention: no spontaneous voids    -pt wants to continue voiding trial  -add urecholine  -smaller cath/lidocaine gel  -continue same dose flomax (bp too low to increase further)  -ucx negative 13. Leucocytosis:   -10.2 today  .  LOS (Days) 5 A FACE TO FACE EVALUATION WAS PERFORMED  Janah Mcculloh T 04/19/2016 9:33 AM

## 2016-04-19 NOTE — Progress Notes (Signed)
Occupational Therapy Session Note  Patient Details  Name: [name] MRN: [id number] Date of Birth: 02-Jul-1940  Today's Date: 04/19/2016 OT Individual Time: 1300-1330 OT Individual Time Calculation (min): 30 min     Short Term Goals: Week 1:  OT Short Term Goal 1 (Week 1): Pt will complete LB bathing with supervision sit to stand 2 consecutive sessions.  OT Short Term Goal 2 (Week 1): Pt will perform tub/shower transfers at supervision level.  OT Short Term Goal 3 (Week 1): Pt will perform LB dressing sit to stand with min supervision.  OT Short Term Goal 4 (Week 1): Pt will tolerate standing for 3 mins with unilateral support of UEs at supervision level during grooming tasks.    Skilled Therapeutic Interventions/Progress Updates:    Pt completed sit to stand and standing balance intervals in the therapy gym.  He needed min assist for balance when picking up checkers from a lower surface and placing on the bedside table during activity.  Worked on sit to stand intervals/squats from elevated mat with min assist for sit to stand.  Lowered mat for second interval of sit to stand transitions as well.  Needed mod demonstrational cueing to achieve full hip extension with sit to stand intervals as pt demonstrates hip flexion with decreased timing of knee and hip extension during repetitions.  Returned to bed at end of session per pt request.  Call button and phone in reach.    Therapy Documentation Precautions:  Precautions Precautions: Fall Restrictions Weight Bearing Restrictions: No  Pain: Pain Assessment Pain Assessment: No/denies pain ADL: See Function Navigator for Current Functional Status.   Therapy/Group: Individual Therapy  Jeanell Mangan OTR/L 04/19/2016, 3:50 PM

## 2016-04-19 NOTE — Progress Notes (Signed)
Occupational Therapy Session Note  Patient Details  Name: [name] MRN: [id number] Date of Birth: [birth year]  Today's Date: 04/19/2016 OT Individual Time: 2426-8341 and 1045-1130 OT Individual Time Calculation (min): 75 min and 45 min    Short Term Goals:Week 1:  OT Short Term Goal 1 (Week 1): Pt will complete LB bathing with supervision sit to stand 2 consecutive sessions.  OT Short Term Goal 2 (Week 1): Pt will perform tub/shower transfers at supervision level.  OT Short Term Goal 3 (Week 1): Pt will perform LB dressing sit to stand with min supervision.  OT Short Term Goal 4 (Week 1): Pt will tolerate standing for 3 mins with unilateral support of UEs at supervision level during grooming tasks.    Skilled Therapeutic Interventions/Progress Updates:    Session One: Pt seen for OT ADL bathing/dressing session. Pt in supine upon arrival, denying pain and agreeable to tx session. He transferred to EOB with use of bed rails and increased time. Min A/ CGA provided for ambulation into bathroom using RW. Min VCs provided for RW management in functional task. Mod A required for controlled descent onto lower seating surfaces including tub bench and standard height toilet. He bathed seated on tub bench with distant supervision, assist provided for washing back. Completed toileting task with steadying assist to complete hygiene with therapist providing assist for thoroughness following pt's attempt. He gathered clothing items from w/c level and dressed seated in w/c with steadying assist provided while pulling pants up. Grooming tasks completed mod I from seated level at sink.  He self propelled w/c to therapy gym with increased time for UE strengthening/ endurance. Completed  Total x12 sit <> stands in sets of 3 with therapy mat progressively being lowered. Pt completed without use of UE support, working on descentric control. Completed with guarding assist. Pt able to stand with supervision and  increased time and effort. Completed horse shoe in standing without AD for balance. Initially required steadying assist, however, progressed to close supervision. Pt taken back to room at end of session, left seated in w/c with NT present.   Session Two: Pt seen for OT session focusing on functional transfers and standing activity tolerance. Pt sitting up in w/c upon arrival, agreeable to tx session. In ADL apartment, pt completed simulated shower stall transfer. Following demonstration and VCs for technique pt completed transfer x2 trials initially requiring min A on first trial progressing to supervision. Required min A to stand from chair without arm support.  He completed transfer from low soft surface couch with min-mod A from low soft surface. VCs provided for effective technique of "nose over toes" and rocking method. In ADL kitchen, pt practiced retrieving and replacing items from overhead cabinet with emphasis on standing tolerance and dynamic balance. Completed with close supervision with one UE on counter for support. Pt knocked arm into cabinet door causing skin team with bleeding resulting. RN made aware and applied dressing, pt denied any pain from cut. He returned to room and left seated in w/c with NT present.  Discussed at length with pt plans for d/c, PLOF, returning to activities, and d/c planning. Pt very active outdoors PTA and would like to return to outdoor chores as soon as able.   Therapy Documentation Precautions:  Precautions Precautions: Fall Restrictions Weight Bearing Restrictions: No  See Function Navigator for Current Functional Status.   Therapy/Group: Individual Therapy  Lewis, Yesica Kemler C 04/19/2016, 7:09 AM

## 2016-04-19 NOTE — Progress Notes (Signed)
Physical Therapy Session Note  Patient Details  Name: [name] MRN: [id number] Date of Birth: May 02, 1941  Today's Date: 04/19/2016 PT Individual Time: 0900-0945 PT Individual Time Calculation (min): 45 min  PT Concurrent Time: 0945-1000 PT Concurrent Time Calculation (min): 15 min   Short Term Goals: Week 1:  PT Short Term Goal 1 (Week 1): Patient will perform transfers using RW with min A.  PT Short Term Goal 2 (Week 1): Patient will ambulate 150 ft with min A.  PT Short Term Goal 3 (Week 1): Patient will negotiate up/down 2 stairs using L rail with min A.  PT Short Term Goal 4 (Week 1): Patient will maintain dynamic standing balance x 3 min with min A.  PT Short Term Goal 5 (Week 1): Patient will perform bed mobility with HOB flat with supervision.  Skilled Therapeutic Interventions/Progress Updates:   Pt received seated in w/c, denies pain and agreeable to treatment. Transported to gym in w/c totalA for energy conservation. Gait in parallel bars forward/backward with BUE support decreased to no UE support with repetition. Side stepping and side stepping with resistance pushing weighted box, BUE support on parallel bars for hip abduction strengthening. Side step braiding x4 trials R/L for coordination, weight shifting; BUE support and min guard with cues for upright posture. Progressive standing balance exercises in parallel bars including normal BOS, narrow BOS, staggered stance, tandem stance, compliant surface, all with eyes open/closed conditions. Pt has greatest difficulty as BOS narrowed and with eyes closed. Standing lunge gastroc stretch in parallel bars x30 sec each side; educated pt in purpose of stretch for improving gait mechanics and balance. Stand pivot transfer w/c <>nustep with close S. Performed nustep at level 6 BUE/BLE x9 min for strengthening and aerobic endurance. Stand pivot transfer w/c >recliner with close S. Remained seated in recliner with all needs in reach at  completion of session.   Therapy Documentation Precautions:  Precautions Precautions: Fall Restrictions Weight Bearing Restrictions: No   See Function Navigator for Current Functional Status.   Therapy/Group: Individual Therapy  Luberta Mutter 04/19/2016, 9:49 AM

## 2016-04-19 NOTE — Progress Notes (Signed)
ANTICOAGULATION CONSULT NOTE - Follow Up Consult  Pharmacy Consult for Coumadin Indication: afib and CVA  Allergies  Allergen Reactions  . Immune Globulins Other (See Comments)    Tetanus Immunes Globulins    (  Pt. Cannot take the Flu shot ) Guillian Barre   . Influenza Vaccines Other (See Comments)    Guillian Barre  . Asa [Aspirin] Other (See Comments)    Severe skin bruising     Patient Measurements: Height: 5\' 11"  (180.3 cm) Weight: 209 lb 7 oz (95 kg) IBW/kg (Calculated) : 75.3  Vital Signs: Temp: 98.2 F (36.8 C) (12/04 0534) Temp Source: Oral (12/04 0534) BP: 107/55 (12/04 0800) Pulse Rate: 55 (12/04 0800)  Labs:  Recent Labs  04/17/16 0615 04/18/16 0449 04/19/16 0509  HGB  --   --  12.1*  HCT  --   --  38.0*  PLT  --   --  208  LABPROT 35.3* 33.4* 31.0*  INR 3.42 3.19 2.91    Estimated Creatinine Clearance: 52.5 mL/min (by C-G formula based on SCr of 1.43 mg/dL (H)).   Medications:  Scheduled:  . bethanechol  25 mg Oral TID  . cholecalciferol  1,000 Units Oral Daily  . diltiazem  120 mg Oral Daily  . feeding supplement (PRO-STAT SUGAR FREE 64)  30 mL Oral BID  . gabapentin  100 mg Oral QHS  . metoprolol tartrate  50 mg Oral BID  . rosuvastatin  40 mg Oral q1800  . tamsulosin  0.4 mg Oral QPC supper  . vitamin B-12  1,000 mcg Oral Once per day on Wed Sat  . Warfarin - Pharmacist Dosing Inpatient   Does not apply q1800   Infusions:    Assessment: 75 yo male with afib and CVA is currently on Coumadin.  INR is 2.91 today. CBC stable, No bleeding noted per chart.   Goal of Therapy:  INR 2-3 Monitor platelets by anticoagulation protocol: Yes   Plan:  Coumadin 2.5 mg Po x 1 tonight Daily INR  Maryanna Shape, PharmD, BCPS  Clinical Pharmacist  Pager: [phone]    04/19/2016 1:26 PM

## 2016-04-19 NOTE — Progress Notes (Signed)
At 2245 assisted the Tech with the straight cath. Because Tech was unable to pass the catheter through. RN was successful and a blood clot passed. During this next straight cath. Was successful but the pt was bleeding around the catheter site. Another clot passed and after removing the catheter it continue to bleed afterwards (drippling). Consulted the Counselling psychologist and will address with the MD before the next straight cath.

## 2016-04-20 ENCOUNTER — Inpatient Hospital Stay (HOSPITAL_COMMUNITY): Payer: PPO | Admitting: Occupational Therapy

## 2016-04-20 ENCOUNTER — Inpatient Hospital Stay (HOSPITAL_COMMUNITY): Payer: PPO | Admitting: Physical Therapy

## 2016-04-20 DIAGNOSIS — G610 Guillain-Barre syndrome: Secondary | ICD-10-CM | POA: Diagnosis not present

## 2016-04-20 DIAGNOSIS — N319 Neuromuscular dysfunction of bladder, unspecified: Secondary | ICD-10-CM | POA: Diagnosis not present

## 2016-04-20 LAB — PROTIME-INR
INR: 2.34
Prothrombin Time: 26 seconds — ABNORMAL HIGH (ref 11.4–15.2)

## 2016-04-20 MED ORDER — BETHANECHOL CHLORIDE 25 MG PO TABS
50.0000 mg | ORAL_TABLET | Freq: Three times a day (TID) | ORAL | Status: DC
  Administered 2016-04-20 – 2016-04-23 (×10): 50 mg via ORAL
  Filled 2016-04-20 (×10): qty 2

## 2016-04-20 MED ORDER — WARFARIN SODIUM 5 MG PO TABS
5.0000 mg | ORAL_TABLET | Freq: Every day | ORAL | Status: DC
  Administered 2016-04-20 – 2016-04-27 (×8): 5 mg via ORAL
  Filled 2016-04-20 (×8): qty 1

## 2016-04-20 NOTE — Progress Notes (Signed)
Occupational Therapy Note  Patient Details  Name: [name] MRN: [id number] Date of Birth: Jul 22, 1940  Today's Date: 04/20/2016 OT Individual Time: 6191-5502 OT Individual Time Calculation (min): 30 min    1:1 Focus on dynamic standing balance on firm surface on [employer] with focus on visual feedback for weight shifts in all directions (limits of stability and lateral and anterior/posterior weight shifts ) with min A. Pt with decr sensation impacting his ability for feedback through LEs. Pt with more difficulty with weight shifts to the right and forward requiring more facilitation. Pt also over compensates with trunk rotation. Returned to room and into recliner with RW with close supervision.Willeen [facility] 04/20/2016, 2:44 PM

## 2016-04-20 NOTE — Evaluation (Signed)
Recreational Therapy Assessment and Plan  Patient Details  Name: [name] MRN: [id number] Date of Birth: 02-03-1941 Today's Date: 04/20/2016  Rehab Potential: Good ELOS: 10 days   Assessment Clinical Impression: Problem List:      Patient Active Problem List   Diagnosis Date Noted  . GBS (Guillain Barre syndrome) ([place]) 04/14/2016  . Neuropathic pain   . Leukocytosis   . Chronic anticoagulation   . Benign essential HTN   . Stage 3 chronic kidney disease   . PAF (paroxysmal atrial fibrillation) ([place])   . History of meningioma   . Urinary retention   . History of Guillain-Barre syndrome   . Weakness of both lower extremities   . Leg weakness 04/04/2016  . Guillain Barr syndrome ([place]) 04/04/2016  . Cellulitis of left knee 03/12/2016  . Atrial fibrillation, new onset ([place]) 03/07/2016  . Urinary tract infection without hematuria 03/07/2016  . Renal insufficiency 03/07/2016  . Hypokalemia 03/07/2016  . History of CVA (cerebrovascular accident) 03/07/2016  . HTN (hypertension) 03/07/2016  . Atrial fibrillation ([place]) 03/07/2016  . Acute urinary retention   . Occlusion and stenosis of carotid artery without mention of cerebral infarction 07/11/2012  . Carotid stenosis 07/11/2012    Past Medical History:      Past Medical History:  Diagnosis Date  . Atrial fibrillation ([place]) 02/2016   NEW O[place]ET  . Brain tumor (benign) ([place]) 1998   2 small tumors- since 2015 (Dr. Saintclair Johnson following)  . Carotid artery occlusion   . Cerebrovascular disease September 09, 2001   TIA, Left brain  . CKD (chronic kidney disease), stage III   . Diverticulosis   . ED (erectile dysfunction)   . Guillain-Barre syndrome ([place]) 1999  . Hx of elevated lipids   . Hyperlipidemia   . Hypertension   . Memory loss   . Stroke [facility]) 2003   Past Surgical History:       Past Surgical History:  Procedure Laterality Date  . BRAIN SURGERY  1998   Benign brain tumor removed,  Left hemisphere- ? Meningioma  . CAROTID ENDARTERECTOMY Left September 13, 2001   LEFT cea  . CHOLECYSTECTOMY  Nov. 2001  . COLON SURGERY  Feb. 2003   Colonic polyps removed endoscopically  . COLONOSCOPY WITH PROPOFOL N/A 11/11/2015   Procedure: COLONOSCOPY WITH PROPOFOL;  Surgeon: Ross Fair, MD;  Location: [facility] ENDOSCOPY;  Service: Endoscopy;  Laterality: N/A;  . INGUINAL HERNIA REPAIR  1970's    Assessment & Plan Clinical Impression: [name] a 75 y.o.malewith history of HTN, CKD, recent diagnosis of PAF, CVA, crani for meningioma resection, recent admissions for urosepsis with urinary retention and left knee cellulitis, h/o GBS who was admitted on 04/04/16 with 2-3 day history of progressive BLE numbness with tingling and falls. MRI spine without evidence of significant stenosis. MRI brain with mildly enlarged right paracentral meningioma and stable left clinoid process meningioma. Neurology evaluated patient and exam consistent with significant BLE weakness with areflexia and BUE with hyporeflexia. He was started on IVIG on 11/19 without significant improvement. LP 11/22 with elevated levels of glucose and protein with cytoalbuminologic dissociation concerning for GBS and he was started on another round of IVIG. CSF cultures with rare staph that was felt to be contaminant per Dr. Tommy Johnson who recommended monitoring at this time. BLE edema treated with diuresis. PT ongoing revealing diffuse weakness requiring Stedy to stand with inability to stand and CIR recommended for follow up therapy.  Patient transferred to  CIR on 04/14/2016.   Pt presents with decreased activity tolerance, decreased functional mobility, decreased balance Limiting pt's independence with leisure/community pursuits.  Leisure History/Participation Premorbid leisure interest/current participation: Joretta Bachelor care;Nature - Other (Comment);Community - Psychologist, forensic (anything  outdoors-likes to stay busy doing whatever needs doing) Other Leisure Interests: Television Leisure Participation Style: With Family/Friends Awareness of Community Resources: Excellent Psychosocial / Spiritual Patient agreeable to Pet Therapy: Yes Does patient have pets?: Yes Social interaction - Mood/Behavior: Cooperative Academic librarian Appropriate for Education?: Yes Recreational Therapy Orientation Orientation -Reviewed with patient: Available activity resources Strengths/Weaknesses Patient Strengths/Abilities: Willingness to participate;Active premorbidly Patient weaknesses: Physical limitations TR Patient demonstrates impairments in the following area(s): Endurance;Motor;Safety  Plan Rec Therapy Plan Is patient appropriate for Therapeutic Recreation?: Yes Rehab Potential: Good Treatment times per week: Min 1 time per week >20 minutes Estimated Length of Stay: 10 days TR Treatment/Interventions: Adaptive equipment instruction;1:1 session;Balance/vestibular training;Functional mobility training;Community reintegration;Patient/family education;Therapeutic activities;Recreation/leisure participation;Therapeutic exercise;UE/LE Coordination activities  Recommendations for other services: None  Discharge Criteria: Patient will be discharged from TR if patient refuses treatment 3 consecutive times without medical reason.  If treatment goals not met, if there is a change in medical status, if patient makes no progress towards goals or if patient is discharged from hospital.  The above assessment, treatment plan, treatment alternatives and goals were discussed and mutually agreed upon: by patient  [place] 04/20/2016, 11:57 AM

## 2016-04-20 NOTE — Progress Notes (Signed)
Social Work Patient ID: [name], male   DOB: 1941/05/07, 75 y.o.   MRN: [id number]  Met with pt and wife who was here and wanted a team conference update due to having cataract surgery tomorrow and Will not be here. Informed his goals are supervision level and target discharge date 12/13. Discussed issue about his bladder and may need to do I & O cath's at home. Wife wanted to know if she can't learn what would Happen. She is pleased with his progress and hopes his bladder will start emptying. Lucy to follow up with upon her return tomorrow.

## 2016-04-20 NOTE — Plan of Care (Signed)
Problem: RH BLADDER ELIMINATION Goal: RH STG MANAGE BLADDER WITH ASSISTANCE STG Manage Bladder With Assistance   Outcome: Not Progressing Coude cath I/O

## 2016-04-20 NOTE — Progress Notes (Signed)
Occupational Therapy Session Note  Patient Details  Name: [name] MRN: [id number] Date of Birth: [birth year]  Today's Date: 04/20/2016 OT Individual Time: 1300-1400 OT Individual Time Calculation (min): 60 min     Short Term Goals: Week 1:  OT Short Term Goal 1 (Week 1): Pt will complete LB bathing with supervision sit to stand 2 consecutive sessions.  OT Short Term Goal 2 (Week 1): Pt will perform tub/shower transfers at supervision level.  OT Short Term Goal 3 (Week 1): Pt will perform LB dressing sit to stand with min supervision.  OT Short Term Goal 4 (Week 1): Pt will tolerate standing for 3 mins with unilateral support of UEs at supervision level during grooming tasks.    Skilled Therapeutic Interventions/Progress Updates:    Pt seen for OT session focusing on functional standing balance, neuro re-ed, and activity tolerance. Pt sitting up in w/c upon arrival, agreeable to tx session. He ambulated to therapy gym with min guard assist using RW. In therapy gym, pt completed weight shifting to R/L to place and remove horseshoes- CGA. Pt then completed reaching task while standing on non- compliant surface, VCs for safety awareness and weight shift. Occasional min A required for balance.  Throughout session required VCs for hand placement during sit <> Stands.  In Therapy day room, watered plants from standing level, completing side steps with RW requiring min A for balance.  Completed side stepping along side of table without UE support focusing on dynamic balance with min A. Pt returned to room at end of session, left seated in w/c with all needs in reach. Discussed at length energy conservation, return to previous leisure and IADL activities, return to driving, current deficits and level of assist and d/c planning.    Therapy Documentation Precautions:  Precautions Precautions: Fall Restrictions Weight Bearing Restrictions: No Pain:   No/ denies pain  See Function  Navigator for Current Functional Status.   Therapy/Group: Individual Therapy  Lewis, Alpheus Stiff C 04/20/2016, 2:04 PM

## 2016-04-20 NOTE — Progress Notes (Signed)
ANTICOAGULATION CONSULT NOTE - Follow Up Consult  Pharmacy Consult for Coumadin Indication: atrial fibrillation and stroke  Allergies  Allergen Reactions  . Immune Globulins Other (See Comments)    Tetanus Immunes Globulins    (  Pt. Cannot take the Flu shot ) Guillian Barre   . Influenza Vaccines Other (See Comments)    Guillian Barre  . Asa [Aspirin] Other (See Comments)    Severe skin bruising     Patient Measurements: Height: 5\' 11"  (180.3 cm) Weight: 209 lb 7 oz (95 kg) IBW/kg (Calculated) : 75.3   Vital Signs: Temp: 98.2 F (36.8 C) (12/05 0533) Temp Source: Oral (12/05 0533) BP: 107/50 (12/05 0907) Pulse Rate: 60 (12/05 0910)  Labs:  Recent Labs  04/18/16 0449 04/19/16 0509 04/20/16 0455  HGB  --  12.1*  --   HCT  --  38.0*  --   PLT  --  208  --   LABPROT 33.4* 31.0* 26.0*  INR 3.19 2.91 2.34    Estimated Creatinine Clearance: 52.5 mL/min (by C-G formula based on SCr of 1.43 mg/dL (H)).   Medications:  Scheduled:  . bethanechol  50 mg Oral TID  . cholecalciferol  1,000 Units Oral Daily  . diltiazem  120 mg Oral Daily  . feeding supplement (PRO-STAT SUGAR FREE 64)  30 mL Oral BID  . gabapentin  100 mg Oral QHS  . metoprolol tartrate  50 mg Oral BID  . rosuvastatin  40 mg Oral q1800  . tamsulosin  0.4 mg Oral QPC supper  . vitamin B-12  1,000 mcg Oral Once per day on Wed Sat  . Warfarin - Pharmacist Dosing Inpatient   Does not apply q1800    Assessment: 75yo male with AFib and stroke.  INR 2.34- decreased but remaining within goal range.  Dose was held 12/2 for supratherapeutic INR.  No bleeding noted.    Goal of Therapy:  INR 2-3 Monitor platelets by anticoagulation protocol: Yes   Plan:  Coumadin 5mg  qday (home dose) COntinue daily INR  Gracy Bruins, PharmD Clinical Pharmacist [facility]

## 2016-04-20 NOTE — Progress Notes (Signed)
PHYSICAL MEDICINE & REHABILITATION     PROGRESS NOTE    Subjective/Complaints: Requiring I/O caths. Doing better with smaller cath/lidocaine. Experiencing no urge to empty however.   ROS: pt denies nausea, vomiting, diarrhea, cough, shortness of breath or chest pain    Objective: Vital Signs: Blood pressure (!) 126/57, pulse 66, temperature 98.2 F (36.8 C), temperature source Oral, resp. rate 16, height 5\' 11"  (1.803 m), weight 95 kg (209 lb 7 oz), SpO2 97 %. No results found.  Recent Labs  04/19/16 0509  WBC 10.2  HGB 12.1*  HCT 38.0*  PLT 208   No results for input(s): NA, K, CL, GLUCOSE, BUN, CREATININE, CALCIUM in the last 72 hours.  Invalid input(s): CO CBG (last 3)  No results for input(s): GLUCAP in the last 72 hours.  Wt Readings from Last 3 Encounters:  04/19/16 95 kg (209 lb 7 oz)  04/14/16 99.7 kg (219 lb 14.4 oz)  03/30/16 101.2 kg (223 lb)    Physical Exam:  Constitutional: He is oriented to person, place, and time. He appears well-developed and well-nourished.  HENT:  NCAT.  Mouth/Throat: Oropharynx is clear and moist.  Eyes: EOMI Neck: Normal range of motion. Neck supple.  Cardiovascular: RRR  Respiratory: clear to auscult GI: Soft. Bowel sounds are normal. He exhibits no distension. There is no tenderness.  Musculoskeletal: trace to 1+ pedal edema B/L LE  Neurological: He is alert and oriented to person, place, and time.   Sensory loss from knees to feet is consistent with prior exams DTRs 1+ bilateral LE and UE    Motor: RUE/RLE: 4/5 proximal to distal LUE/4/5 proximal to distal   LLE 4/5 hf,ke and ADF/PF 3 to 3+/5.  Skin: skin intact GU: no urethral irritation Psychiatric: pleasant and appropriate  Assessment/Plan: 1. Functional and mobility deficits secondary to GBS which require 3+ hours per day of interdisciplinary therapy in a comprehensive inpatient rehab setting. Physiatrist is providing close team supervision and 24  hour management of active medical problems listed below. Physiatrist and rehab team continue to assess barriers to discharge/monitor patient progress toward functional and medical goals.  Function:  Bathing Bathing position   Position: Shower  Bathing parts Body parts bathed by patient: Left arm, Right arm, Chest, Abdomen, Front perineal area, Buttocks, Right upper leg, Left upper leg, Right lower leg, Left lower leg Body parts bathed by helper: Back  Bathing assist Assist Level: Supervision or verbal cues      Upper Body Dressing/Undressing Upper body dressing   What is the patient wearing?: Pull over shirt/dress     Pull over shirt/dress - Perfomed by patient: Thread/unthread right sleeve, Thread/unthread left sleeve, Put head through opening, Pull shirt over trunk          Upper body assist Assist Level: More than reasonable time   Set up : To obtain clothing/put away  Lower Body Dressing/Undressing Lower body dressing   What is the patient wearing?: Pants, Non-skid slipper socks, Ted Hose Underwear - Performed by patient: Thread/unthread right underwear leg, Thread/unthread left underwear leg Underwear - Performed by helper: Pull underwear up/down Pants- Performed by patient: Thread/unthread right pants leg, Thread/unthread left pants leg, Pull pants up/down, Fasten/unfasten pants Pants- Performed by helper: Thread/unthread left pants leg, Pull pants up/down, Thread/unthread right pants leg Non-skid slipper socks- Performed by patient: Don/doff right sock, Don/doff left sock Non-skid slipper socks- Performed by helper: Don/doff left sock  TED Hose - Performed by helper: Don/doff right TED hose, Don/doff left TED hose  Lower body assist Assist for lower body dressing: Touching or steadying assistance (Pt > 75%)   Set up : Don/doff TED stockings  Toileting Toileting Toileting activity did not occur: Refused Toileting steps completed by patient: Adjust  clothing prior to toileting, Adjust clothing after toileting Toileting steps completed by helper: Adjust clothing after toileting Toileting Assistive Devices: Grab bar or rail  Toileting assist Assist level: Touching or steadying assistance (Pt.75%)   Transfers Chair/bed transfer   Chair/bed transfer method: Stand pivot Chair/bed transfer assist level: Touching or steadying assistance (Pt > 75%) Chair/bed transfer assistive device: Armrests, Medical sales representative     Max distance: 115 Assist level: Touching or steadying assistance (Pt > 75%)   Wheelchair   Type: Manual Max wheelchair distance: 175 Assist Level: Supervision or verbal cues  Cognition Comprehension Comprehension assist level: Follows complex conversation/direction with extra time/assistive device  Expression Expression assist level: Expresses complex ideas: With extra time/assistive device  Social Interaction Social Interaction assist level: Interacts appropriately with others with medication or extra time (anti-anxiety, antidepressant).  Problem Solving Problem solving assist level: Solves basic problems with no assist  Memory Memory assist level: Requires cues to use assistive device   Medical Problem List and Plan: 1.  Weakness, gait abnormality, poor activity tolerance secondary to GBS (with history of GBS).  -continue CIR therapies 2.  DVT Prophylaxis/Anticoagulation:  Coumadin 3. Pain Management: continue low dose Neurontin. Tylenol prn effective 4. Mood: Motivated to work hard and get better. LCSW to follow for evaluation and support.  5. Neuropsych: This patient is capable of making decisions on his own behalf. 6. Skin/Wound Care: routine pressure relief measures.  7. Fluids/Electrolytes/Nutrition: Monitor I/O. I personally reviewed the patient's labs today.   -push po  8. HTN: Monitor BID.  metoprolol and cardizem 9. A fib: in NSR- HR remains controlled on cardizem, metoprolol and coumadin.    10. Recurrent meningiomas: Being monitored.  11. CKD: Monitor with serial checks. Baseline Cr- 1.4-1.5.   - edema is trace  -elevation/compression 12. Urinary retention: no spontaneous voids    -pt wants to continue voiding trial  -increase urecholine to 50mg  TID  -smaller cath/lidocaine gel  -continue same dose flomax (bp too low to increase further)  -ucx negative  -If we don't start seeing signs of improvement, we will need to begin I/O cathing program. He (or wife) will need to learn.  13. Leucocytosis:   -10.2   .  LOS (Days) 6 A FACE TO FACE EVALUATION WAS PERFORMED  [name] T 04/20/2016 8:28 AM

## 2016-04-20 NOTE — Progress Notes (Signed)
Physical Therapy Session Note  Patient Details  Name: [name] MRN: [id number] Date of Birth: [birth year]  Today's Date: 04/20/2016 PT Individual Time: 0800-0900 and 1425-1525 PT Individual Time Calculation (min): 60 min and 60 min (total 120 min)    Short Term Goals: Week 1:  PT Short Term Goal 1 (Week 1): Patient will perform transfers using RW with min A.  PT Short Term Goal 2 (Week 1): Patient will ambulate 150 ft with min A.  PT Short Term Goal 3 (Week 1): Patient will negotiate up/down 2 stairs using L rail with min A.  PT Short Term Goal 4 (Week 1): Patient will maintain dynamic standing balance x 3 min with min A.  PT Short Term Goal 5 (Week 1): Patient will perform bed mobility with HOB flat with supervision.  Skilled Therapeutic Interventions/Progress Updates:   Tx 1: Pt received supine in bed, denies pain and agreeable to treatment. Supine>sit with HOB elevated 20 degrees and bedrails with S. Seated on EOB, pt dons non-skid socks with S and increased time due to difficulty crossing LEs to reach feet. Pt requesting to shower; gait into bathroom with RW and close S; min cues for upright posture. Session with focus on sit <>stand with grab bars, dynamic sitting and standing balance, UE ROM while performing shower. Required setupA for items used during shower, and S for standing balance. Required assist for washing pt's back and bottom of feet. Assisted pt with donning brief; after donning pt requests to use restroom. Ambulates <5' with RW and close S to toilet. Gait out of bathroom with RW and S x15' to sit at sink and complete dressing. On first trial donning short, pt threaded BLEs into a single leg opening; required cueing to identify and correct. Performed teeth brushing, hair brushing with modI seated at sink. Gait x75' with RW and S; min cues for upright posture and forward gaze. Remained seated in recliner at end of session, all needs in reach.   Tx 2: Pt received seated in  recliner, denies pain and agreeable to treatment. Gait to gym with RW and min guard faded to S x175'; min cues for upright posture. Gait forward/backward and sidestepping without RW, LUE on rail and min guard. Pt fatigues easily, only requires min guard. Stand pivot transfer w/c >mat table with min guard. Sit <>stand 2x10 reps while performing stability ball toss with therapist; difficulty with recall sequencing of when to toss the ball. Static standing balance with ball toss to rebounder; min guard and cues to reduce LE reliance on mat table. Standing balance alternating toe taps to 3" step minA initially, decreased to min guard with practice and repetition. Educated pt on importance of weight shifting, single limb stance for functional mobility tasks. Pt's wife present for end of session to observe. Pt performed ascent/descent of 8 stairs 6-inch height with S and B handrails; performed 2 trials with seated rest break between due to fatigue. Gait to return to room with RW and close S x175'. Discussed home setup and estimated LOS with pt and wife, steady progress towards goals, and bladder function limiting independence upon return home. Communicated with CSW that pt's wife present and would like an update as she will not be able to be here tomorrow. Remained seated in recliner with wife present at end of session, all needs in reach.   Therapy Documentation Precautions:  Precautions Precautions: Fall Restrictions Weight Bearing Restrictions: No   See Function Navigator for Current Functional Status.  Therapy/Group: Individual Therapy  Luberta Mutter 04/20/2016, 8:59 AM

## 2016-04-21 ENCOUNTER — Inpatient Hospital Stay (HOSPITAL_COMMUNITY): Payer: PPO | Admitting: Physical Therapy

## 2016-04-21 ENCOUNTER — Inpatient Hospital Stay (HOSPITAL_COMMUNITY): Payer: PPO | Admitting: Occupational Therapy

## 2016-04-21 DIAGNOSIS — I482 Chronic atrial fibrillation: Secondary | ICD-10-CM | POA: Diagnosis not present

## 2016-04-21 DIAGNOSIS — N183 Chronic kidney disease, stage 3 (moderate): Secondary | ICD-10-CM

## 2016-04-21 DIAGNOSIS — R609 Edema, unspecified: Secondary | ICD-10-CM

## 2016-04-21 DIAGNOSIS — G610 Guillain-Barre syndrome: Secondary | ICD-10-CM | POA: Diagnosis not present

## 2016-04-21 DIAGNOSIS — R339 Retention of urine, unspecified: Secondary | ICD-10-CM | POA: Diagnosis not present

## 2016-04-21 LAB — BASIC METABOLIC PANEL
ANION GAP: 7 (ref 5–15)
BUN: 24 mg/dL — ABNORMAL HIGH (ref 6–20)
CO2: 26 mmol/L (ref 22–32)
Calcium: 9 mg/dL (ref 8.9–10.3)
Chloride: 105 mmol/L (ref 101–111)
Creatinine, Ser: 1.61 mg/dL — ABNORMAL HIGH (ref 0.61–1.24)
GFR calc Af Amer: 47 mL/min — ABNORMAL LOW (ref >60)
GFR, EST NON AFRICAN AMERICAN: 40 mL/min — AB (ref >60)
Glucose, Bld: 107 mg/dL — ABNORMAL HIGH (ref 65–99)
POTASSIUM: 4.3 mmol/L (ref 3.5–5.1)
SODIUM: 138 mmol/L (ref 135–145)

## 2016-04-21 LAB — PROTIME-INR
INR: 2.15
Prothrombin Time: 24.4 seconds — ABNORMAL HIGH (ref 11.4–15.2)

## 2016-04-21 LAB — CBC
HEMATOCRIT: 38.8 % — AB (ref 39.0–52.0)
HEMOGLOBIN: 12.2 g/dL — AB (ref 13.0–17.0)
MCH: 27.9 pg (ref 26.0–34.0)
MCHC: 31.4 g/dL (ref 30.0–36.0)
MCV: 88.6 fL (ref 78.0–100.0)
Platelets: 206 10*3/uL (ref 150–400)
RBC: 4.38 MIL/uL (ref 4.22–5.81)
RDW: 14 % (ref 11.5–15.5)
WBC: 9.9 10*3/uL (ref 4.0–10.5)

## 2016-04-21 MED ORDER — FUROSEMIDE 20 MG PO TABS
20.0000 mg | ORAL_TABLET | Freq: Every day | ORAL | Status: DC
  Administered 2016-04-21 – 2016-04-27 (×7): 20 mg via ORAL
  Filled 2016-04-21 (×7): qty 1

## 2016-04-21 NOTE — Progress Notes (Signed)
Recreational Therapy Session Note  Patient Details  Name: [name] MRN: [id number] Date of Birth: 11-11-1940 Today's Date: 04/21/2016  Pain: no c/o Pt participated in animal assisted activity/therapy seated in recliner with supervision.  Wilder 04/21/2016, 3:29 PM

## 2016-04-21 NOTE — Progress Notes (Signed)
[place] PHYSICAL MEDICINE & REHABILITATION     PROGRESS NOTE    Subjective/Complaints: Requiring I/O caths. Doing better with smaller cath/lidocaine. Experiencing no urge to empty however.   ROS: pt denies nausea, vomiting, diarrhea, cough, shortness of breath or chest pain    Objective: Vital Signs: Blood pressure (!) 120/47, pulse (!) 58, temperature 97.7 F (36.5 C), temperature source Oral, resp. rate 17, height 5\' 11"  (1.803 m), weight 95.6 kg (210 lb 12.2 oz), SpO2 94 %. No results found.  Recent Labs  04/19/16 0509 04/21/16 0415  WBC 10.2 9.9  HGB 12.1* 12.2*  HCT 38.0* 38.8*  PLT 208 206    Recent Labs  04/21/16 0415  NA 138  K 4.3  CL 105  GLUCOSE 107*  BUN 24*  CREATININE 1.61*  CALCIUM 9.0   CBG (last 3)  No results for input(s): GLUCAP in the last 72 hours.  Wt Readings from Last 3 Encounters:  04/21/16 95.6 kg (210 lb 12.2 oz)  04/14/16 99.7 kg (219 lb 14.4 oz)  03/30/16 101.2 kg (223 lb)    Physical Exam:  Constitutional: He is oriented to person, place, and time. He appears well-developed and well-nourished.  HENT:  NCAT.  Mouth/Throat: Oropharynx is clear and moist.  Eyes: EOMI Neck: Normal range of motion. Neck supple.  Cardiovascular: RRR  Respiratory: clear to auscult GI: Soft. Bowel sounds are normal. He exhibits no distension. There is no tenderness.  Musculoskeletal: trace to 1+ pedal edema B/L LE  Neurological: He is alert and oriented to person, place, and time.   Sensory loss from knees to feet is consistent with prior exams DTRs 1+ bilateral LE and UE    Motor: RUE/RLE: 4/5 proximal to distal LUE/4/5 proximal to distal   LLE 4/5 hf,ke and ADF/PF 3 to 3+/5.  Skin: skin intact GU: no urethral irritation Psychiatric: pleasant and appropriate  Assessment/Plan: 1. Functional and mobility deficits secondary to GBS which require 3+ hours per day of interdisciplinary therapy in a comprehensive inpatient rehab  setting. Physiatrist is providing close team supervision and 24 hour management of active medical problems listed below. Physiatrist and rehab team continue to assess barriers to discharge/monitor patient progress toward functional and medical goals.  Function:  Bathing Bathing position   Position: Shower  Bathing parts Body parts bathed by patient: Left arm, Right arm, Chest, Abdomen, Front perineal area, Buttocks, Right upper leg, Left upper leg, Right lower leg, Left lower leg Body parts bathed by helper: Back  Bathing assist Assist Level: Supervision or verbal cues      Upper Body Dressing/Undressing Upper body dressing   What is the patient wearing?: Pull over shirt/dress     Pull over shirt/dress - Perfomed by patient: Thread/unthread right sleeve, Thread/unthread left sleeve, Put head through opening, Pull shirt over trunk          Upper body assist Assist Level: More than reasonable time   Set up : To obtain clothing/put away  Lower Body Dressing/Undressing Lower body dressing   What is the patient wearing?: Pants, Non-skid slipper socks, Ted Hose Underwear - Performed by patient: Thread/unthread right underwear leg, Thread/unthread left underwear leg Underwear - Performed by helper: Pull underwear up/down Pants- Performed by patient: Thread/unthread right pants leg, Thread/unthread left pants leg, Pull pants up/down, Fasten/unfasten pants Pants- Performed by helper: Thread/unthread left pants leg, Pull pants up/down, Thread/unthread right pants leg Non-skid slipper socks- Performed by patient: Don/doff left sock, Don/doff right sock Non-skid slipper socks- Performed by  helper: Don/doff left sock               TED Hose - Performed by helper: Don/doff left TED hose, Don/doff right TED hose  Lower body assist Assist for lower body dressing: Touching or steadying assistance (Pt > 75%)   Set up : Don/doff TED stockings  Toileting Toileting Toileting activity did  not occur: Refused Toileting steps completed by patient: (P) Adjust clothing prior to toileting, Performs perineal hygiene, Adjust clothing after toileting Toileting steps completed by helper: Adjust clothing after toileting Toileting Assistive Devices: Grab bar or rail  Toileting assist Assist level: Supervision or verbal cues   Transfers Chair/bed transfer   Chair/bed transfer method: Ambulatory Chair/bed transfer assist level: Supervision or verbal cues Chair/bed transfer assistive device: Armrests, Medical sales representative     Max distance: 175 Assist level: Supervision or verbal cues   Wheelchair   Type: Manual Max wheelchair distance: 175 Assist Level: Supervision or verbal cues  Cognition Comprehension Comprehension assist level: Follows complex conversation/direction with extra time/assistive device  Expression Expression assist level: Expresses complex ideas: With extra time/assistive device  Social Interaction Social Interaction assist level: Interacts appropriately with others with medication or extra time (anti-anxiety, antidepressant).  Problem Solving Problem solving assist level: Solves basic problems with no assist  Memory Memory assist level: More than reasonable amount of time   Medical Problem List and Plan: 1.  Weakness, gait abnormality, poor activity tolerance secondary to GBS (with history of GBS).  -continue CIR therapies  -progressing with mobility 2.  DVT Prophylaxis/Anticoagulation:  Coumadin 3. Pain Management: continue low dose Neurontin. Tylenol as needed 4. Mood: Motivated to work hard and get better. LCSW to follow for evaluation and support.  5. Neuropsych: This patient is capable of making decisions on his own behalf. 6. Skin/Wound Care: routine pressure relief measures.  7. Fluids/Electrolytes/Nutrition: Monitor I/O. I personally reviewed the patient's labs today.   -push po  8. HTN: Monitor BID.  metoprolol and cardizem 9. A fib:  in NSR- HR controlled on cardizem, metoprolol and coumadin.   10. Recurrent meningiomas: Being monitored.  11. CKD: Monitor with serial checks. Baseline Cr- 1.4-1.5.   -I personally reviewed the patient's labs today.   - edema is trace  -elevation/compression  -weight is trending up---begin daily lasix 20mg  daily  -had normal EF in october 12. Urinary retention: no spontaneous voids but beginning to have urge to empty   -  continue voiding trial  -increased urecholine to 50mg  TID  -smaller cath/lidocaine gel for I/O caths  -consider increasing flomax (concerned about hypotension however)  -ucx negative    13. Leucocytosis:   -10.2   .  LOS (Days) 7 A FACE TO FACE EVALUATION WAS PERFORMED  Carrell Palmatier T 04/21/2016 9:23 AM

## 2016-04-21 NOTE — Progress Notes (Signed)
Occupational Therapy Weekly Progress Note  Patient Details  Name: Jadarius Commons MRN: [id number] Date of Birth: 05/27/1940  Beginning of progress report period: April 15, 2016 End of progress report period: April 27, 2016  Today's Date: 04/21/2016 OT Individual Time: 4098-1191 and 1400-1430 OT Individual Time Calculation (min): 60 min and 30 min   Patient has met 4 of 4 short term goals.  Pt making  Excellent progress towards OT goals. He is currently at supervision- min A level. He demonstrates greatest deficits with dynamic standing balance and weight shift due to decreased proprioceptive input in B LEs.   Patient continues to demonstrate the following deficits: muscle weakness (generalized)and therefore will continue to benefit from skilled OT intervention to enhance overall performance with BADL and Reduce care partner burden.  Patient progressing toward long term goals..  Continue plan of care.  OT Short Term Goals Week 1:  OT Short Term Goal 1 (Week 1): Pt will complete LB bathing with supervision sit to stand 2 consecutive sessions.  OT Short Term Goal 1 - Progress (Week 1): Met OT Short Term Goal 2 (Week 1): Pt will perform tub/shower transfers at supervision level.  OT Short Term Goal 2 - Progress (Week 1): Met OT Short Term Goal 3 (Week 1): Pt will perform LB dressing sit to stand with min supervision.  OT Short Term Goal 3 - Progress (Week 1): Met OT Short Term Goal 4 (Week 1): Pt will tolerate standing for 3 mins with unilateral support of UEs at supervision level during grooming tasks.   OT Short Term Goal 4 - Progress (Week 1): Met Week 2:  OT Short Term Goal 1 (Week 2): STG=LTG due to LOS   Skilled Therapeutic Interventions/Progress Updates:    Session One: Pt seen for OT session focusing ADL re-trianing and functional activity tolerance and balance. Pt in supine upon arrival, finishing breakfast and agreeable to tx session. He declined bathing/dressing this  morning, opting to complete duirng afternoon session.  He donned socks setaed EOB with set-up, total A to don TED hose. Educated regarding donning lotion distal to proximal for edema management. He ambulated to sink with RW and supervision, completing grooming tasks in standing with distant supervision.  He ambulated into bathroom to complete toileting task, required assist for controlled descent onto lower toilet. Steadying assist provided while pt completed clothing management.  He ambulated with close supervision to therapy gym. Verbal and tactile cuing provided for upright posture wen ambulating.  In gym, pt stood on foam wedge mat to complete pip tree activity in standing promoting upright posture/ positioning. Completed x 2 trials with seated rest break provided btwn trials. Pt ambulated back to room with supervision. Requested for another attempt to void. Toilting task completed in same manner as described above.  Left seated in recliner at end of session, LEs elevated for edema management and all needs in reach.   Session Two: Pt seen for OT ADL bathing/dressing session. Pt in supine upon arrival, voicing desire for showering task. He ambulated with RW and supervision to gather clothing items. Guarding assist provided while pt bent to obtain items from bottom drawer. He bathed seated on tub bench, completing sit <> stands with heavy reliance on grab bars for balance when completing pericare/ buttock hygiene. VCs provided for technique of crossing ankle of knee to access to wash B feet. He returned to recliner to dress, standing at [facility] with supervision to pull pants up. TEDs donned total A. Pt left seated  in recliner at end of session, all needs in reach.   Therapy Documentation Precautions:  Precautions Precautions: Fall Restrictions Weight Bearing Restrictions: No Pain:   No/ denies pain  See Function Navigator for Current Functional Status.   Therapy/Group: Individual  Therapy  Lewis, Sharni Negron C 04/21/2016, 7:03 AM

## 2016-04-21 NOTE — Plan of Care (Signed)
Problem: RH BLADDER ELIMINATION Goal: RH STG MANAGE BLADDER WITH EQUIPMENT WITH ASSISTANCE STG Manage Bladder With Equipment With min Assistance    Outcome: Not Applicable Date Met: 04/06/61 Pt voiding with low PVR's. No I/O needed at this time

## 2016-04-21 NOTE — Progress Notes (Signed)
Physical Therapy Session Note  Patient Details  Name: [name] MRN: [id number] Date of Birth: 1940-06-21  Today's Date: 04/21/2016 PT Individual Time: 1000-1100 and 1445-1530 PT Individual Time Calculation (min): 60 min and 45 min (total 105 min)    Short Term Goals: Week 1:  PT Short Term Goal 1 (Week 1): Patient will perform transfers using RW with min A.  PT Short Term Goal 2 (Week 1): Patient will ambulate 150 ft with min A.  PT Short Term Goal 3 (Week 1): Patient will negotiate up/down 2 stairs using L rail with min A.  PT Short Term Goal 4 (Week 1): Patient will maintain dynamic standing balance x 3 min with min A.  PT Short Term Goal 5 (Week 1): Patient will perform bed mobility with HOB flat with supervision.  Skilled Therapeutic Interventions/Progress Updates:   Tx 1: Pt received seated in recliner, denies pain and agreeable to treatment. Gait to gym with RW and close S; min cues for upright posture. Seated hamstring, gastroc/soleus stretch with towel and 3" step, 2 x1 min each LE with cueing for hip hinge and reducing lumbar flexion. Standing balance with dynamic stepping over trekking pole with lateral steps and forward/backward steps with heavy HHA initially reduced to light UE support. Repetitive verbal/tactile cues for upright posture. Combined stepping activity into 4 square step test with heavy HHA x4 trials, continued repetitive cueing for upright posture. Pt educated on safety following a fall, including conditions in which he should not attempt to get up without assist from emergency services. Floor transfer performed with min guard and min cues for technique. After completion of transfer, pt unable to repeat back conditions when he should not get up alone. Plan to follow up education with wife. Nustep x10 min with BUE/BLE level 6 with average 70 steps/min. Gait to return to room with RW and close S. Remained supine in bed at end of session, all needs in reach.   Tx 2:  Pt received seated in recliner, denies pain and agreeable to treatment. Pt excited about voiding on his own several times today. Gait to/from gym with RW and S, cues for upright posture. Performed bed mobility modI on flat bed in ADL apartment. Couch transfer performed with S due to low seat height. Gait on ramp, uneven mulch surface and on curb step with RW and S. Stairs 1x4 with BUE on rails and S. Standing gastroc/soleus stretch 1x1 min each position BLE. Heel raises x15 reps. Gait to return to room as above. Sit <>stand x10 reps with RUE support on recliner and S. Performed shaving with setupA to obtain razor and mirror. Remained seated in recliner at end of session, all needs in reach.   Therapy Documentation Precautions:  Precautions Precautions: Fall Restrictions Weight Bearing Restrictions: No   See Function Navigator for Current Functional Status.   Therapy/Group: Individual Therapy  Luberta Mutter 04/21/2016, 10:55 AM

## 2016-04-21 NOTE — Patient Care Conference (Signed)
Inpatient RehabilitationTeam Conference and Plan of Care Update Date: 04/20/2016   Time: 2:10 pm    Patient Name: [name]      Medical Record Number: [id number]  Date of Birth: 01/08/1941 Sex: Male         Room/Bed: 4M07C/4M07C-01 Payor Info: Payor: [employer] / Plan: [employer] / Product Type: *No Product type* /    Admitting Diagnosis: GBS  Admit Date/Time:  04/14/2016  6:09 PM Admission Comments: No comment available   Primary Diagnosis:  GBS (Guillain Barre syndrome) ([place]) Principal Problem: GBS (Guillain Barre syndrome) [facility])  Patient Active Problem List   Diagnosis Date Noted  . GBS (Guillain Barre syndrome) ([place]) 04/14/2016  . Neuropathic pain   . Leukocytosis   . Chronic anticoagulation   . Benign essential HTN   . Stage 3 chronic kidney disease   . PAF (paroxysmal atrial fibrillation) ([place])   . History of meningioma   . Urinary retention   . History of Guillain-Barre syndrome   . Weakness of both lower extremities   . Leg weakness 04/04/2016  . Guillain Barr syndrome ([place]) 04/04/2016  . Cellulitis of left knee 03/12/2016  . Atrial fibrillation, new onset ([place]) 03/07/2016  . Urinary tract infection without hematuria 03/07/2016  . Renal insufficiency 03/07/2016  . Hypokalemia 03/07/2016  . History of CVA (cerebrovascular accident) 03/07/2016  . HTN (hypertension) 03/07/2016  . Atrial fibrillation ([place]) 03/07/2016  . Acute urinary retention   . Occlusion and stenosis of carotid artery without mention of cerebral infarction 07/11/2012  . Carotid stenosis 07/11/2012    Expected Discharge Date: Expected Discharge Date: 04/28/16  Team Members Present: Physician leading conference: Dr. Alger Simons Social Worker Present: Alfonse Alpers, LCSW Nurse Present: Dorien Chihuahua, RN PT Present: Kem Parkinson, PT OT Present: Napoleon Form, OT PPS Coordinator present : Daiva Nakayama, RN, CRRN     Current Status/Progress Goal Weekly Team Focus   Medical   GBS with tetraplegia and neurogenic bladder (has hx of GBS with residual sensory loss).  retaining urine  improve functional mobility  bladder emptying, bp control, pain   Bowel/Bladder   Continent of Bowel. Last BM 12/4. Bladder: q3-4hrs bladder scan and straight cath. >350.  Continue Bowel regimen. Continue flomax and urocholine.   Teach Pt to straight cath. self. Assess abdomen for distention.   Swallow/Nutrition/ Hydration             ADL's   Min A overall  Supervision overall  Standing balance; activity tolerance; ADL/ IADL re-training   Mobility   S bed mobility, min guard/S transfers and gait, minA stairs  Supervision overall  LE strengthening, endurance, coordination, LE ROM   Communication             Safety/Cognition/ Behavioral Observations            Pain   No pain. No PRN/scheduled pain meds in use.  Assess pain qShift.  Pt will continue to be pain free.   Skin   BLE +3 Edema.  Elevate BLE at night and TED hose during the day.  Assess BLE and skin qShift. Decrease BLE edema.    Rehab Goals Patient on target to meet rehab goals: Yes *See Care Plan and progress notes for long and short-term goals.  Barriers to Discharge: neurogenic bladder    Possible Resolutions to Barriers:  medical mgt, training in i/o caths, ?foley    Discharge Planning/Teaching Needs:  Plan home with wife who can provide 24/7 assistance.  Teaching is ongoing.   Team Discussion:  Recurrent GBS with sensory loss to legs.  Bladder not emptying and will likely need I/O caths at home.  Pt becoming more open to this idea. Min guard to close supervision with amb.  Making good progress.  Revisions to Treatment Plan:  None   Continued Need for Acute Rehabilitation Level of Care: The patient requires daily medical management by a physician with specialized training in physical medicine and rehabilitation for the following conditions: Daily direction of a multidisciplinary physical  rehabilitation program to ensure safe treatment while eliciting the highest outcome that is of practical value to the patient.: Yes Daily medical management of patient stability for increased activity during participation in an intensive rehabilitation regime.: Yes Daily analysis of laboratory values and/or radiology reports with any subsequent need for medication adjustment of medical intervention for : Neurological problems;Urological problems  Natalyah Cummiskey 04/21/2016, 8:04 AM

## 2016-04-21 NOTE — Progress Notes (Signed)
ANTICOAGULATION CONSULT NOTE - Follow Up Consult  Pharmacy Consult for Coumadin Indication: atrial fibrillation and stroke  Allergies  Allergen Reactions  . Immune Globulins Other (See Comments)    Tetanus Immunes Globulins    (  Pt. Cannot take the Flu shot ) Guillian Barre   . Influenza Vaccines Other (See Comments)    Guillian Barre  . Asa [Aspirin] Other (See Comments)    Severe skin bruising     Patient Measurements: Height: 5\' 11"  (180.3 cm) Weight: 210 lb 12.2 oz (95.6 kg) IBW/kg (Calculated) : 75.3   Vital Signs: Temp: 97.7 F (36.5 C) (12/06 0535) Temp Source: Oral (12/06 0535) BP: 120/47 (12/06 0858) Pulse Rate: 58 (12/06 0858)  Labs:  Recent Labs  04/19/16 0509 04/20/16 0455 04/21/16 0415  HGB 12.1*  --  12.2*  HCT 38.0*  --  38.8*  PLT 208  --  206  LABPROT 31.0* 26.0* 24.4*  INR 2.91 2.34 2.15  CREATININE  --   --  1.61*    Estimated Creatinine Clearance: 46.8 mL/min (by C-G formula based on SCr of 1.61 mg/dL (H)).   Medications:  Scheduled:  . bethanechol  50 mg Oral TID  . cholecalciferol  1,000 Units Oral Daily  . diltiazem  120 mg Oral Daily  . feeding supplement (PRO-STAT SUGAR FREE 64)  30 mL Oral BID  . furosemide  20 mg Oral Daily  . gabapentin  100 mg Oral QHS  . metoprolol tartrate  50 mg Oral BID  . rosuvastatin  40 mg Oral q1800  . tamsulosin  0.4 mg Oral QPC supper  . vitamin B-12  1,000 mcg Oral Once per day on Wed Sat  . warfarin  5 mg Oral q1800  . Warfarin - Pharmacist Dosing Inpatient   Does not apply q1800    Assessment: 75yo male with AFib and stroke.  INR 2.15- decreased but remaining within goal range.  Dose was held 12/2 for supratherapeutic INR.  No bleeding noted.    Goal of Therapy:  INR 2-3 Monitor platelets by anticoagulation protocol: Yes   Plan:  Coumadin 5mg  qday (home dose) COntinue daily INR  Maryanna Shape, PharmD, BCPS  Clinical Pharmacist  Pager: [phone]

## 2016-04-22 ENCOUNTER — Inpatient Hospital Stay (HOSPITAL_COMMUNITY): Payer: PPO | Admitting: Occupational Therapy

## 2016-04-22 ENCOUNTER — Inpatient Hospital Stay (HOSPITAL_COMMUNITY): Payer: PPO | Admitting: Physical Therapy

## 2016-04-22 DIAGNOSIS — N183 Chronic kidney disease, stage 3 (moderate): Secondary | ICD-10-CM | POA: Diagnosis not present

## 2016-04-22 DIAGNOSIS — G610 Guillain-Barre syndrome: Secondary | ICD-10-CM | POA: Diagnosis not present

## 2016-04-22 DIAGNOSIS — I10 Essential (primary) hypertension: Secondary | ICD-10-CM | POA: Diagnosis not present

## 2016-04-22 DIAGNOSIS — R339 Retention of urine, unspecified: Secondary | ICD-10-CM | POA: Diagnosis not present

## 2016-04-22 LAB — PROTIME-INR
INR: 2.21
PROTHROMBIN TIME: 24.9 s — AB (ref 11.4–15.2)

## 2016-04-22 NOTE — Progress Notes (Signed)
ANTICOAGULATION CONSULT NOTE - Follow Up Consult  Pharmacy Consult for coumadin Indication: afib and stroke  Allergies  Allergen Reactions  . Immune Globulins Other (See Comments)    Tetanus Immunes Globulins    (  Pt. Cannot take the Flu shot ) Guillian Barre   . Influenza Vaccines Other (See Comments)    Guillian Barre  . Asa [Aspirin] Other (See Comments)    Severe skin bruising     Patient Measurements: Height: 5\' 11"  (180.3 cm) Weight: 209 lb 14.1 oz (95.2 kg) IBW/kg (Calculated) : 75.3 Heparin Dosing Weight:   Vital Signs: Temp: 97.8 F (36.6 C) (12/07 0541) Temp Source: Oral (12/07 0541) BP: 131/67 (12/07 0541) Pulse Rate: 72 (12/07 0541)  Labs:  Recent Labs  04/20/16 0455 04/21/16 0415 04/22/16 0509  HGB  --  12.2*  --   HCT  --  38.8*  --   PLT  --  206  --   LABPROT 26.0* 24.4* 24.9*  INR 2.34 2.15 2.21  CREATININE  --  1.61*  --     Estimated Creatinine Clearance: 46.7 mL/min (by C-G formula based on SCr of 1.61 mg/dL (H)).   Medications:  Scheduled:  . bethanechol  50 mg Oral TID  . cholecalciferol  1,000 Units Oral Daily  . diltiazem  120 mg Oral Daily  . feeding supplement (PRO-STAT SUGAR FREE 64)  30 mL Oral BID  . furosemide  20 mg Oral Daily  . gabapentin  100 mg Oral QHS  . metoprolol tartrate  50 mg Oral BID  . rosuvastatin  40 mg Oral q1800  . tamsulosin  0.4 mg Oral QPC supper  . vitamin B-12  1,000 mcg Oral Once per day on Wed Sat  . warfarin  5 mg Oral q1800  . Warfarin - Pharmacist Dosing Inpatient   Does not apply q1800   Infusions:    Assessment: 75 yo male with afib and stroke is currently on therapeutic coumadin.  INR today is 2.21.  Goal of Therapy:  INR 2-3 Monitor platelets by anticoagulation protocol: Yes   Plan:  - continue coumadin 5 mg po daily - INR in am   Wallis Spizzirri, Tsz-Yin 04/22/2016,8:26 AM

## 2016-04-22 NOTE — Progress Notes (Signed)
Occupational Therapy Session Note  Patient Details  Name: [name] MRN: [id number] Date of Birth: 04-Oct-1940  Today's Date: 04/22/2016 OT Individual Time: 1000-1100 OT Individual Time Calculation (min): 60 min    Short Term Goals: Week 2:  OT Short Term Goal 1 (Week 2): STG=LTG due to LOS  Skilled Therapeutic Interventions/Progress Updates:    OT session focused on dynamic standing balance, activity tolerance, LB coordination, UB strengthening, and sit<>stand. Pt completed standing activity on foam pad with tactile cues for weight shifting to maintain center of gravity. UB there ex using 1 LB weight for 10x3 sets of bicep curls and triceps press. B LE coordination and step follow through with foam block and alternating kick while ambulating in hallway with RW .  Therapy Documentation Precautions:  Precautions Precautions: Fall Restrictions Weight Bearing Restrictions: No Pain:  none/denies pain  See Function Navigator for Current Functional Status.   Therapy/Group: Individual Therapy  Valma Cava 04/22/2016, 12:37 PM

## 2016-04-22 NOTE — Progress Notes (Signed)
Physical Therapy Weekly Progress Note  Patient Details  Name: [name] MRN: [id number] Date of Birth: 03-28-1941  Beginning of progress report period: April 15, 2016 End of progress report period: April 22, 2016  Today's Date: 04/22/2016 PT Individual Time: 0900-1000 PT Individual Time Calculation (min): 60 min    Patient has met 5 of 5 short term goals.  Pt currently requires min guard to close S for all ambulatory mobility tasks due to LE strength deficits, decreased proprioception and ankle/hip strategy for recovering LOB. Patient was very active before and is hard working, motivated to improve, and have noted an improved activity tolerance both within therapy sessions and throughout the day. Patient's wife present for one therapy session and pleased with progress. She does not feel as though she can physically assist pt beyond basic setup for tasks and this therapist does not anticipate more assistance than that will be needed by d/c.   Patient continues to demonstrate the following deficits: impaired activity tolerance, balance, postural control, ability to compensate for deficits, functional use of  right lower extremity and left lower extremity, awareness and coordination and therefore will continue to benefit from skilled PT intervention to enhance overall performance with bed mobility, transfers, gait, stairs, independence with ADLs and home/community access.  Patient progressing toward long term goals..  Continue plan of care.  PT Short Term Goals Week 1:  PT Short Term Goal 1 (Week 1): Patient will perform transfers using RW with min A.  PT Short Term Goal 1 - Progress (Week 1): Met PT Short Term Goal 2 (Week 1): Patient will ambulate 150 ft with min A.  PT Short Term Goal 2 - Progress (Week 1): Met PT Short Term Goal 3 (Week 1): Patient will negotiate up/down 2 stairs using L rail with min A.  PT Short Term Goal 3 - Progress (Week 1): Met PT Short Term Goal 4 (Week  1): Patient will maintain dynamic standing balance x 3 min with min A.  PT Short Term Goal 4 - Progress (Week 1): Met PT Short Term Goal 5 (Week 1): Patient will perform bed mobility with HOB flat with supervision. PT Short Term Goal 5 - Progress (Week 1): Met Week 2:  PT Short Term Goal 1 (Week 2): =LTG due to estimated LOS, S overall   Skilled Therapeutic Interventions/Progress Updates:   Pt received seated in recliner, denies pain and agreeable to treatment. Verbalizes that he is pleased that he is consistently voiding. Gait in/out of bathroom with lights in room dimmed for increased proprioceptive demand and adaptation to low light for carryover into going to bathroom at night, etc. Pt urinated 100 mL. Gait to gym with RW and S x175'. Prone lying x5 min with alternating flta lying >propped on elbows for hip flexor/lumbar extension stretch. Quadruped cat/camel and alternating UE raises for core stability, UE strengthening and coordination. Supine bridging x10 reps, and 2x15 reps bridging with isometric hip adduction ball squeeze. Pt unable to recall standing gastroc/soleus stretch, required demonstration and verbal cues for gastroc vs soleus, and duration of stretch. Performed on BLE x45 sec each position. Gait to return to room with RW and S, cues for upright posture. Remained seated in recliner at end of session, all needs in reach.   Therapy Documentation Precautions:  Precautions Precautions: Fall Restrictions Weight Bearing Restrictions: No   See Function Navigator for Current Functional Status.  Therapy/Group: Individual Therapy  Luberta Mutter 04/22/2016, 10:00 AM

## 2016-04-22 NOTE — Progress Notes (Signed)
[place] PHYSICAL MEDICINE & REHABILITATION     PROGRESS NOTE    Subjective/Complaints: Requiring I/O caths. Doing better with smaller cath/lidocaine. Experiencing no urge to empty however.   ROS: pt denies nausea, vomiting, diarrhea, cough, shortness of breath or chest pain    Objective: Vital Signs: Blood pressure 131/67, pulse 72, temperature 97.8 F (36.6 C), temperature source Oral, resp. rate 18, height 5\' 11"  (1.803 m), weight 95.2 kg (209 lb 14.1 oz), SpO2 99 %. No results found.  Recent Labs  04/21/16 0415  WBC 9.9  HGB 12.2*  HCT 38.8*  PLT 206    Recent Labs  04/21/16 0415  NA 138  K 4.3  CL 105  GLUCOSE 107*  BUN 24*  CREATININE 1.61*  CALCIUM 9.0   CBG (last 3)  No results for input(s): GLUCAP in the last 72 hours.  Wt Readings from Last 3 Encounters:  04/22/16 95.2 kg (209 lb 14.1 oz)  04/14/16 99.7 kg (219 lb 14.4 oz)  03/30/16 101.2 kg (223 lb)    Physical Exam:  Constitutional: He is oriented to person, place, and time. He appears well-developed and well-nourished.  HENT:  NCAT.  Mouth/Throat: Oropharynx is clear and moist.  Eyes: EOMI Neck: Normal range of motion. Neck supple.  Cardiovascular: RRR  Respiratory: clear to auscult GI: Soft. Bowel sounds are normal. He exhibits no distension. There is no tenderness.  Musculoskeletal: trace to 1+ pedal edema B/L LE  Neurological: He is alert and oriented to person, place, and time.   Sensory loss from knees to feet is consistent with prior exams DTRs 1+ bilateral LE and UE    Motor: RUE/RLE: 4/5 proximal to distal LUE/4/5 proximal to distal   LLE 4/5 hf,ke and ADF/PF 3 to 3+/5.  Skin: skin intact GU: no urethral irritation Psychiatric: pleasant and appropriate  Assessment/Plan: 1. Functional and mobility deficits secondary to GBS which require 3+ hours per day of interdisciplinary therapy in a comprehensive inpatient rehab setting. Physiatrist is providing close team supervision  and 24 hour management of active medical problems listed below. Physiatrist and rehab team continue to assess barriers to discharge/monitor patient progress toward functional and medical goals.  Function:  Bathing Bathing position   Position: Shower  Bathing parts Body parts bathed by patient: Left arm, Right arm, Chest, Abdomen, Front perineal area, Buttocks, Right upper leg, Left upper leg, Right lower leg, Left lower leg Body parts bathed by helper: Back  Bathing assist Assist Level: Supervision or verbal cues      Upper Body Dressing/Undressing Upper body dressing   What is the patient wearing?: Pull over shirt/dress     Pull over shirt/dress - Perfomed by patient: Thread/unthread right sleeve, Thread/unthread left sleeve, Put head through opening, Pull shirt over trunk          Upper body assist Assist Level: More than reasonable time   Set up : To obtain clothing/put away  Lower Body Dressing/Undressing Lower body dressing   What is the patient wearing?: Pants, Non-skid slipper socks, Ted Hose Underwear - Performed by patient: Thread/unthread right underwear leg, Thread/unthread left underwear leg, Pull underwear up/down Underwear - Performed by helper: Pull underwear up/down Pants- Performed by patient: Thread/unthread right pants leg, Thread/unthread left pants leg, Pull pants up/down, Fasten/unfasten pants Pants- Performed by helper: Thread/unthread left pants leg, Pull pants up/down, Thread/unthread right pants leg Non-skid slipper socks- Performed by patient: Don/doff left sock, Don/doff right sock Non-skid slipper socks- Performed by helper: Don/doff left sock  TED Hose - Performed by helper: Don/doff left TED hose, Don/doff right TED hose  Lower body assist Assist for lower body dressing: Set up   Set up : Don/doff TED stockings  Toileting Toileting Toileting activity did not occur: Refused Toileting steps completed by patient: Adjust clothing  prior to toileting, Performs perineal hygiene, Adjust clothing after toileting Toileting steps completed by helper: Adjust clothing prior to toileting, Performs perineal hygiene, Adjust clothing after toileting Toileting Assistive Devices: Grab bar or rail  Toileting assist Assist level: Supervision or verbal cues   Transfers Chair/bed transfer   Chair/bed transfer method: Ambulatory Chair/bed transfer assist level: Supervision or verbal cues Chair/bed transfer assistive device: Medical sales representative     Max distance: 175 Assist level: Supervision or verbal cues   Wheelchair   Type: Manual Max wheelchair distance: 175 Assist Level: Supervision or verbal cues  Cognition Comprehension Comprehension assist level: Follows complex conversation/direction with extra time/assistive device  Expression Expression assist level: Expresses complex ideas: With extra time/assistive device  Social Interaction Social Interaction assist level: Interacts appropriately with others with medication or extra time (anti-anxiety, antidepressant).  Problem Solving Problem solving assist level: Solves basic problems with no assist  Memory Memory assist level: More than reasonable amount of time   Medical Problem List and Plan: 1.  Weakness, gait abnormality, poor activity tolerance secondary to GBS (with history of GBS).  -continue CIR therapies  -PT,OT 2.  DVT Prophylaxis/Anticoagulation:  Coumadin per pharm therapeutic 3. Pain Management: continue low dose Neurontin. Tylenol as needed 4. Mood: Motivated to work hard and get better. LCSW to follow for evaluation and support.  5. Neuropsych: This patient is capable of making decisions on his own behalf. 6. Skin/Wound Care: routine pressure relief measures.  7. Fluids/Electrolytes/Nutrition: Monitor I/O. Encouraging PO 8. HTN: Monitor BID.  metoprolol and cardizem 9. A fib: in NSR- HR controlled on cardizem, metoprolol and coumadin.   10.  Recurrent meningiomas: Being monitored.  11. CKD: Monitor with serial checks. Baseline Cr- 1.4-1.5.   -continue lasix 20mg  daily   - edema is trace, weight sl down today tl 95.2  -elevation/compression  -continue lasix 20mg  daily  -had normal EF in october 12. Urinary retention: has begun to empty.   -  continue voiding trial  - urecholine to 50mg  TID  -smaller cath/lidocaine gel for I/O caths  -consider increasing flomax (concerned about hypotension however)  -ucx negative    13. Leucocytosis:   -10.2   .  LOS (Days) 8 A FACE TO FACE EVALUATION WAS PERFORMED  Graviel Payeur T 04/22/2016 8:00 AM

## 2016-04-22 NOTE — Progress Notes (Signed)
Occupational Therapy Session Note  Patient Details  Name: [name] MRN: [id number] Date of Birth: [birth year]  Today's Date: 04/22/2016 OT Individual Time: 1173-5670 and 1430-1500 OT Individual Time Calculation (min): 60 min and 30 min    Short Term Goals:Week 2: STG=LTG due to LOS Skilled Therapeutic Interventions/Progress Updates:    Session One: Pt seen for OT session focusing on functional ambulation and ADL re-training. Pt in supine upon arrival, agreeable to tx session and denying pain.  He transferred to EOB using hospital bed functions mod I. He ambulated throughout room with supervision and into bathroom to complete toileting task.  He gathered clothing items, bending to bottom drawer to retrieve items with supervision. He dressed seated EOB with increased time, complete sit <> stands to pull pants up.  Grooming completed standing at sink with overall supervision, however, when pt closed his eyes to wash face, x2 different episodes of LOB posteriorly. One first LOB pt able to use sink ledge to regain balance. Second episode of LOB required max- total A from therapist to prevent fall. Pt educated regarding reason for LOB with eye clothes as pt with decreased proprioceptive input in B LEs. Educated regarding functional implications and safety awareness during tasks with low or occluded vision including standing with eyes clothes or walking in dimly lit areas.  Pt ambulated to therapy gym with supervision. Completed x2 standing trials with and without UE support with vision occluded. Pt required guarding assist with occasional min A for balance as pt swaying during static standing. Seated rest breaks provided btwn trials.  He then completed weight shift program utilizing [employer] focusing on % weightbearing. Pt able to maintain midline weightshift ~10 seconds before requiring to re-shift. Completed x2 trials one with UE support and one without. Tactile cuing provided for upright  posture. Attempted to maintain midline stance with vision occluded, however, pt with immediate posterior LOB requiring assist to regain balance. Following seated rest break, pt ambulated back to room, left seated in recliner with LEs elevated for edema control and all needs in reach. RN present administering medications.  Throughout session, discussed fall risk, reasons for LOB, and d/c planning.   Session Two: Pt seen for OT ADL bathing/dressing session. Pt sitting up in recliner upon arrival, eager for showering task. He ambulated with RW and close supervision, requiring more VCs this session for RW management this session than in previous sessions. He bathed seated on tub bench, lateral leans for buttock hygiene. Toileting task and dressing completed with steadying assist required for dynamic standing balance. Grooming completed standing at sink, pt demonstrated decreased dynamic standing balance this session, requiring occasional min A for balance.  Pt returned to recliner at end of session, all needs in reach. Increased redness noted on dorsal aspect of feet. Therefore TEDs remained off and RN made aware in order to assess.   Therapy Documentation Precautions:  Precautions Precautions: Fall Restrictions Weight Bearing Restrictions: No  See Function Navigator for Current Functional Status.   Therapy/Group: Individual Therapy  Lewis, Tawana Pasch C 04/22/2016, 7:01 AM

## 2016-04-23 ENCOUNTER — Inpatient Hospital Stay (HOSPITAL_COMMUNITY): Payer: PPO | Admitting: Occupational Therapy

## 2016-04-23 ENCOUNTER — Inpatient Hospital Stay (HOSPITAL_COMMUNITY): Payer: PPO | Admitting: Physical Therapy

## 2016-04-23 ENCOUNTER — Inpatient Hospital Stay (HOSPITAL_COMMUNITY): Payer: PPO

## 2016-04-23 DIAGNOSIS — G610 Guillain-Barre syndrome: Secondary | ICD-10-CM | POA: Diagnosis not present

## 2016-04-23 DIAGNOSIS — I482 Chronic atrial fibrillation: Secondary | ICD-10-CM | POA: Diagnosis not present

## 2016-04-23 DIAGNOSIS — R339 Retention of urine, unspecified: Secondary | ICD-10-CM | POA: Diagnosis not present

## 2016-04-23 DIAGNOSIS — N183 Chronic kidney disease, stage 3 (moderate): Secondary | ICD-10-CM | POA: Diagnosis not present

## 2016-04-23 LAB — BASIC METABOLIC PANEL
ANION GAP: 9 (ref 5–15)
BUN: 26 mg/dL — AB (ref 6–20)
CHLORIDE: 104 mmol/L (ref 101–111)
CO2: 25 mmol/L (ref 22–32)
Calcium: 8.9 mg/dL (ref 8.9–10.3)
Creatinine, Ser: 1.58 mg/dL — ABNORMAL HIGH (ref 0.61–1.24)
GFR calc Af Amer: 48 mL/min — ABNORMAL LOW (ref >60)
GFR calc non Af Amer: 41 mL/min — ABNORMAL LOW (ref >60)
GLUCOSE: 108 mg/dL — AB (ref 65–99)
POTASSIUM: 4 mmol/L (ref 3.5–5.1)
Sodium: 138 mmol/L (ref 135–145)

## 2016-04-23 LAB — PROTIME-INR
INR: 2.35
Prothrombin Time: 26.1 seconds — ABNORMAL HIGH (ref 11.4–15.2)

## 2016-04-23 MED ORDER — BETHANECHOL CHLORIDE 25 MG PO TABS
25.0000 mg | ORAL_TABLET | Freq: Three times a day (TID) | ORAL | Status: DC
  Administered 2016-04-23 (×2): 25 mg via ORAL
  Filled 2016-04-23 (×2): qty 1

## 2016-04-23 NOTE — Progress Notes (Signed)
Occupational Therapy Session Note  Patient Details  Name: Jettson Crable MRN: [id number] Date of Birth: July 11, 1940  Today's Date: 04/23/2016 OT Individual Time: 1407-1430 OT Individual Time Calculation (min): 23 min     Short Term Goals: Week 2:  OT Short Term Goal 1 (Week 2): STG=LTG due to LOS  Skilled Therapeutic Interventions/Progress Updates:    Pt seen for OT session focusing on family/ caregiver education. Unscheduled therapy session, however, pt requesting OT come to educate and discuss with wife DME, assist level, and d/c planning. Extensive time spend regarding various forms of bathroom DME, including use of 3-1 BSC, tub/shower transfer bench, and recommendations for bathroom modifications for increased safety at d/c. Also educated regarding pt's current level of function and deficits including functional implications, return to activity, continuum of care, and d/c planning.  Pt declined practicing simulated tub/shower transfer this session, opting to complete in session next week. All questions answered. Pt and pt's wife appreciative of time and education. Left seated in recliner with all needs in reach and wife present.   Therapy Documentation Precautions:  Precautions Precautions: Fall Restrictions Weight Bearing Restrictions: No  See Function Navigator for Current Functional Status.   Therapy/Group: Individual Therapy  Lewis, Bradie Lacock C 04/23/2016, 4:31 PM

## 2016-04-23 NOTE — Progress Notes (Signed)
Physical Therapy Session Note  Patient Details  Name: [name] MRN: [id number] Date of Birth: 1940-09-10  Today's Date: 04/23/2016 PT Individual Time: 9191-6606 PT Individual Time Calculation (min): 40 min    Short Term Goals: Week 2:  PT Short Term Goal 1 (Week 2): =LTG due to estimated LOS, S overall  Skilled Therapeutic Interventions/Progress Updates:    Pt resting in recliner with no c/o pain and agreeable to therapy session.  Session focus on activity tolerance, LE strengthening and flexibility.  Pt requesting to toilet prior to leaving room, ambulates to bathroom and performs 3/3 toileting steps with supervision.  Gait training to/from therapy gym (via distant gym door for greater cardiopulmonary challenge).  PT provided PROM stretching and instruction for pt to perform for hamstrings, hip flexors, glutes, piriformis, and heel cords 2x30 seconds each.  Pt returned to room at end of session and positioned self in bed with supervision.  Call bell in reach and needs met.   Therapy Documentation Precautions:  Precautions Precautions: Fall Restrictions Weight Bearing Restrictions: No   See Function Navigator for Current Functional Status.   Therapy/Group: Individual Therapy  [name] 04/23/2016, 3:40 PM

## 2016-04-23 NOTE — Progress Notes (Signed)
Occupational Therapy Session Note  Patient Details  Name: [name] MRN: [id number] Date of Birth: 1941-05-06  Today's Date: 04/23/2016 OT Individual Time: 1601-0932 OT Individual Time Calculation (min): 60 min     Short Term Goals:Week 2:  OT Short Term Goal 1 (Week 2): STG=LTG due to LOS  Skilled Therapeutic Interventions/Progress Updates:    Pt seen for OT session focusing on ADL retraining, functional transfers, and functional ambulation/ balance.  Pt sitting up in bed upon arrival, finishing breakfast and agreeable to tx session. He transferred to EOB using hospital bed functions mod I. He ambulated throughout room with RW to complete toileting and and standing grooming tasks at sink at overall supervision level. Min VCs provided for safety awareness.  He ambulated to ADL apartment where he completed simulated shower stall transfer, able to recall technique taught in previous session. Educated and discussed regarding DME. Recommending use of BSC as shower seat as it has B arm support to assist with sit <> Stand. Pt going to check with his wife regarding what pieces of DME they have at home.  Completed bed mobility on standard bed supervision- mod I. In therapy gym completed simulated bed mobility on raised mat in simulation of home bed height. Pt completed sidestepping activity in hallway utilizing rail, emphasizing upright posture, verbal and tactile cuing provided for posture. He then completed ambulation with AD using hand rail in hallway, completed with guarding assist, pt with decreased balance when only having R UE support on rail. Pt ambulated back to room at end of session, left seated in recliner with all needs in reach.   Therapy Documentation Precautions:  Precautions Precautions: Fall Restrictions Weight Bearing Restrictions: No Pain:   No/ denies pain  See Function Navigator for Current Functional Status.   Therapy/Group: Individual Therapy  Lewis, Jamesen Stahnke  C 04/23/2016, 7:05 AM

## 2016-04-23 NOTE — Progress Notes (Addendum)
ANTICOAGULATION CONSULT NOTE - Follow Up Consult  Pharmacy Consult for coumadin Indication: afib and stroke  Allergies  Allergen Reactions  . Immune Globulins Other (See Comments)    Tetanus Immunes Globulins    (  Pt. Cannot take the Flu shot ) Guillian Barre   . Influenza Vaccines Other (See Comments)    Guillian Barre  . Asa [Aspirin] Other (See Comments)    Severe skin bruising     Patient Measurements: Height: 5\' 11"  (180.3 cm) Weight: 183 lb 3.2 oz (83.1 kg) IBW/kg (Calculated) : 75.3 Heparin Dosing Weight:   Vital Signs: Temp: 97.6 F (36.4 C) (12/08 0553) Temp Source: Oral (12/08 0553)  Labs:  Recent Labs  04/21/16 0415 04/22/16 0509 04/23/16 0440  HGB 12.2*  --   --   HCT 38.8*  --   --   PLT 206  --   --   LABPROT 24.4* 24.9* 26.1*  INR 2.15 2.21 2.35  CREATININE 1.61*  --  1.58*    Estimated Creatinine Clearance: 43 mL/min (by C-G formula based on SCr of 1.58 mg/dL (H)).   Medications:  Scheduled:  . bethanechol  50 mg Oral TID  . cholecalciferol  1,000 Units Oral Daily  . diltiazem  120 mg Oral Daily  . feeding supplement (PRO-STAT SUGAR FREE 64)  30 mL Oral BID  . furosemide  20 mg Oral Daily  . gabapentin  100 mg Oral QHS  . metoprolol tartrate  50 mg Oral BID  . rosuvastatin  40 mg Oral q1800  . tamsulosin  0.4 mg Oral QPC supper  . vitamin B-12  1,000 mcg Oral Once per day on Wed Sat  . warfarin  5 mg Oral q1800  . Warfarin - Pharmacist Dosing Inpatient   Does not apply q1800   Infusions:    Assessment: 75 yo male with afib and stroke is currently on therapeutic coumadin.  INR today is 2.35.  Goal of Therapy:  INR 2-3 Monitor platelets by anticoagulation protocol: Yes   Plan:  - continue coumadin 5 mg po daily - INR x1 tom, and change INR to MWF  Vaidehi Braddy, Tsz-Yin 04/23/2016,8:20 AM

## 2016-04-23 NOTE — Consult Note (Signed)
   [facility] CM Inpatient Consult   04/23/2016  [name] April 14, 1941 [id number]    [facility] Care Management follow up. Spoke with [name] on the inpatient rehab unit. He remains agreeable to ongoing [place] Management follow up post discharge. Tentative discharge date from inpatient rehab is set for Wednesday, 04/28/16. Will alert [facility] Community NP. Spoke with inpatient rehab CSWs about [facility] to continuing to follow post discharge.  Marthenia Rolling, MSN-Ed, RN,BSN [facility] Liaison [phone]

## 2016-04-23 NOTE — Progress Notes (Signed)
Occupational Therapy Session Note  Patient Details  Name: [name] MRN: [id number] Date of Birth: 12-Sep-1940  Today's Date: 04/23/2016 OT Individual Time: 3888-7579 OT Individual Time Calculation (min): 32 min     Short Term Goals:Week 2:  OT Short Term Goal 1 (Week 2): STG=LTG due to LOS  Skilled Therapeutic Interventions/Progress Updates:    Pt seen for ADL retraining with a focus on activity tolerance and balance.  Pt used RW to ambulate to toilet then to shower then to recliner with S.  With a long handled sponge, he was S with bathing. Good balance demonstrated with all tasks of toileting and dressing in standing with S. Pt resting in recliner at end of session with all needs met.  Therapy Documentation Precautions:  Precautions Precautions: Fall Restrictions Weight Bearing Restrictions: No   Pain: Pain Assessment Pain Assessment: No/denies pain ADL:    See Function Navigator for Current Functional Status.   Therapy/Group: Individual Therapy  [place] 04/23/2016, 12:30 PM

## 2016-04-23 NOTE — Progress Notes (Signed)
Physical Therapy Session Note  Patient Details  Name: [name] MRN: [id number] Date of Birth: [birth year]  Today's Date: 04/23/2016 PT Individual Time: 1300-1358 PT Individual Time Calculation (min): 58 min     Skilled Therapeutic Interventions/Progress Updates:    Pt completed toileting needs including walking in and out of bathroom with RW at overall supervision level at start of session. Session focused on gait training with RW on unit with focus on endurance and upright posture (pt reports h/o of kyphotic posture with walking), neuro re-ed activities to address dynamic balance, balance reactions, stretching to heel cords, and gait without AD to challenge balance with overall min assist, stair negotiation training for home entry practice (R rail only) with min to mod assist, and gait over ramped and mulched surface with RW for community mobility. Pt overall supervision using RW with improved balance noted even without UE support. Cues for decreased reliance on UE's with mobility to progress functional mobility. Pt noted with R ankle inversion/supination during gait without shoes - pt declines being aware that he was doing this and unable to tell due to proprioceptive/sensory impairments. Pt donned shoes at supervision level seated in chair and wearing shoes demonstrated improved ankle support. Discussed potential for soft aircast if this is ongoing or overpowering shoes - this PT does not regularly work with patient to be able to determine if this is new or ongoing. Will discuss with primary PT. Pt continues to make functional gains with mobility and balance.   Five times Sit to Stand Test (FTSS) Method: Use a straight back chair with a solid seat that is 16-18" high. Ask participant to sit on the chair with arms folded across their chest.   Instructions: "Stand up and sit down as quickly as possible 5 times, keeping your arms folded across your chest."   Measurement: Stop timing when  the participant stands the 5th time.  TIME: _15____ (in seconds)  Times > 13.6 seconds is associated with increased disability and morbidity (Guralnik, 2000) Times > 15 seconds is predictive of recurrent falls in healthy individuals aged 80 and older (Buatois, et al., 2008) Normal performance values in community dwelling individuals aged 69 and older (Bohannon, 2006): o 60-69 years: 11.4 seconds o 70-79 years: 12.6 seconds o 80-89 years: 14.8 seconds  MCID: ? 2.3 seconds for Vestibular Disorders Mariah Milling, 2006)   Therapy Documentation Precautions:  Precautions Precautions: Fall Restrictions Weight Bearing Restrictions: No  Pain: Pain Assessment Pain Assessment: No/denies pain   See Function Navigator for Current Functional Status.   Therapy/Group: Individual Therapy  [name], PT, DPT  04/23/2016, 3:25 PM

## 2016-04-23 NOTE — Progress Notes (Signed)
[facility] PHYSICAL MEDICINE & REHABILITATION     PROGRESS NOTE    Subjective/Complaints: Emptying bladder regularly now. Actually had some incontinence over night. Denies any dysuria but has frequency.   ROS: pt denies nausea, vomiting, diarrhea, cough, shortness of breath or chest pain     Objective: Vital Signs: Blood pressure (!) 113/47, pulse (!) 56, temperature 97.6 F (36.4 C), temperature source Oral, resp. rate 16, height 5\' 11"  (1.803 m), weight 83.1 kg (183 lb 3.2 oz), SpO2 90 %. No results found.  Recent Labs  04/21/16 0415  WBC 9.9  HGB 12.2*  HCT 38.8*  PLT 206    Recent Labs  04/21/16 0415 04/23/16 0440  NA 138 138  K 4.3 4.0  CL 105 104  GLUCOSE 107* 108*  BUN 24* 26*  CREATININE 1.61* 1.58*  CALCIUM 9.0 8.9   CBG (last 3)  No results for input(s): GLUCAP in the last 72 hours.  Wt Readings from Last 3 Encounters:  04/23/16 83.1 kg (183 lb 3.2 oz)  04/14/16 99.7 kg (219 lb 14.4 oz)  03/30/16 101.2 kg (223 lb)    Physical Exam:  Constitutional: He is oriented to person, place, and time. He appears well-developed and well-nourished.  HENT:  NCAT.  Mouth/Throat: Oropharynx is clear and moist.  Eyes: EOMI Neck: Normal range of motion. Neck supple.  Cardiovascular: RRR without murmur Respiratory: cta bilaterally GI: Soft. Bowel sounds are normal. He exhibits no distension. There is no tenderness.  Musculoskeletal: trace pedal edema B/L LE  Neurological: He is alert and oriented to person, place, and time.   Sensory loss from knees to feet is consistent with prior exams DTRs 1+ bilateral LE and UE    Motor: RUE/RLE: 4/5 proximal to distal LUE/4/5 proximal to distal   LLE 4/5 hf,ke and ADF/PF 3 to 3+/5.  Skin: skin intact GU: no urethral irritation Psychiatric: pleasant and appropriate  Assessment/Plan: 1. Functional and mobility deficits secondary to GBS which require 3+ hours per day of interdisciplinary therapy in a comprehensive  inpatient rehab setting. Physiatrist is providing close team supervision and 24 hour management of active medical problems listed below. Physiatrist and rehab team continue to assess barriers to discharge/monitor patient progress toward functional and medical goals.  Function:  Bathing Bathing position   Position: Shower  Bathing parts Body parts bathed by patient: Left arm, Right arm, Chest, Abdomen, Front perineal area, Buttocks, Right upper leg, Left upper leg, Right lower leg, Left lower leg Body parts bathed by helper: Back  Bathing assist Assist Level: Supervision or verbal cues      Upper Body Dressing/Undressing Upper body dressing   What is the patient wearing?: Pull over shirt/dress     Pull over shirt/dress - Perfomed by patient: Thread/unthread right sleeve, Thread/unthread left sleeve, Put head through opening, Pull shirt over trunk          Upper body assist Assist Level: More than reasonable time   Set up : To obtain clothing/put away  Lower Body Dressing/Undressing Lower body dressing   What is the patient wearing?: Pants, Non-skid slipper socks, Ted Hose Underwear - Performed by patient: Thread/unthread right underwear leg, Thread/unthread left underwear leg, Pull underwear up/down Underwear - Performed by helper: Pull underwear up/down Pants- Performed by patient: Thread/unthread right pants leg, Thread/unthread left pants leg, Pull pants up/down, Fasten/unfasten pants Pants- Performed by helper: Thread/unthread left pants leg, Pull pants up/down, Thread/unthread right pants leg Non-skid slipper socks- Performed by patient: Don/doff left sock, Don/doff  right sock Non-skid slipper socks- Performed by helper: Don/doff left sock               TED Hose - Performed by helper: Don/doff left TED hose, Don/doff right TED hose  Lower body assist Assist for lower body dressing: Set up   Set up : Don/doff TED stockings  Toileting Toileting Toileting activity did  not occur: Safety/medical concerns Toileting steps completed by patient: Adjust clothing prior to toileting, Adjust clothing after toileting Toileting steps completed by helper: Performs perineal hygiene Toileting Assistive Devices: Toilet aid, Other (comment)  Toileting assist Assist level: Touching or steadying assistance (Pt.75%)   Transfers Chair/bed transfer   Chair/bed transfer method: Ambulatory Chair/bed transfer assist level: Supervision or verbal cues Chair/bed transfer assistive device: Medical sales representative     Max distance: 175 Assist level: Supervision or verbal cues   Wheelchair   Type: Manual Max wheelchair distance: 175 Assist Level: Supervision or verbal cues  Cognition Comprehension Comprehension assist level: Follows complex conversation/direction with extra time/assistive device  Expression Expression assist level: Expresses complex ideas: With extra time/assistive device  Social Interaction Social Interaction assist level: Interacts appropriately with others with medication or extra time (anti-anxiety, antidepressant).  Problem Solving Problem solving assist level: Solves basic problems with no assist  Memory Memory assist level: More than reasonable amount of time   Medical Problem List and Plan: 1.  Weakness, gait abnormality, poor activity tolerance secondary to GBS (with history of GBS).  -continue CIR PT,OT 2.  DVT Prophylaxis/Anticoagulation:  Coumadin per pharm therapeutic 3. Pain Management: continue low dose Neurontin. Tylenol as needed 4. Mood: Motivated to work hard and get better. LCSW to follow for evaluation and support.  5. Neuropsych: This patient is capable of making decisions on his own behalf. 6. Skin/Wound Care: routine pressure relief measures.  7. Fluids/Electrolytes/Nutrition: Monitor I/O. Encouraging PO 8. HTN: Monitor BID.  metoprolol and cardizem 9. A fib: in NSR- HR controlled on cardizem, metoprolol and coumadin.    10. Recurrent meningiomas: Being monitored.  11. CKD:  Baseline Cr- 1.4-1.5. ----at 1.58 today, labs reviewed  -recheck bmet monday  -continue lasix 20mg  daily   - edema is sl decreased. Weight inaccurate today---reweigh   -elevation/compression  -continue lasix 20mg  daily  -had normal EF in october 12. Urinary retention: emptying bladder now   -  continue voiding trial  -decrease urecholine to 25mg  TID  -I/O cath prn  -continue current flomax   -recent ucx negative    13. Leucocytosis:   -10.2   .  LOS (Days) 9 A FACE TO FACE EVALUATION WAS PERFORMED  Darianny Momon T 04/23/2016 9:29 AM

## 2016-04-24 ENCOUNTER — Inpatient Hospital Stay (HOSPITAL_COMMUNITY): Payer: PPO | Admitting: Physical Therapy

## 2016-04-24 DIAGNOSIS — I482 Chronic atrial fibrillation: Secondary | ICD-10-CM | POA: Diagnosis not present

## 2016-04-24 DIAGNOSIS — G610 Guillain-Barre syndrome: Secondary | ICD-10-CM | POA: Diagnosis not present

## 2016-04-24 DIAGNOSIS — I10 Essential (primary) hypertension: Secondary | ICD-10-CM | POA: Diagnosis not present

## 2016-04-24 DIAGNOSIS — N183 Chronic kidney disease, stage 3 (moderate): Secondary | ICD-10-CM | POA: Diagnosis not present

## 2016-04-24 DIAGNOSIS — R339 Retention of urine, unspecified: Secondary | ICD-10-CM | POA: Diagnosis not present

## 2016-04-24 LAB — PROTIME-INR
INR: 2.14
PROTHROMBIN TIME: 24.3 s — AB (ref 11.4–15.2)

## 2016-04-24 MED ORDER — BETHANECHOL CHLORIDE 10 MG PO TABS
10.0000 mg | ORAL_TABLET | Freq: Four times a day (QID) | ORAL | Status: DC
  Administered 2016-04-24 (×4): 10 mg via ORAL
  Filled 2016-04-24 (×3): qty 1

## 2016-04-24 NOTE — Progress Notes (Signed)
PHYSICAL MEDICINE & REHABILITATION     PROGRESS NOTE    Subjective/Complaints: Pt without new issues slept ok, had small void with 403 ml BVI but waited and tried again with 0 ml BVI post void   ROS: pt denies nausea, vomiting, diarrhea, cough, shortness of breath or chest pain     Objective: Vital Signs: Blood pressure 128/60, pulse 60, temperature 97.8 F (36.6 C), temperature source Oral, resp. rate 17, height 5\' 11"  (1.803 m), weight 83.8 kg (184 lb 11.9 oz), SpO2 94 %. No results found. No results for input(s): WBC, HGB, HCT, PLT in the last 72 hours.  Recent Labs  04/23/16 0440  NA 138  K 4.0  CL 104  GLUCOSE 108*  BUN 26*  CREATININE 1.58*  CALCIUM 8.9   CBG (last 3)  No results for input(s): GLUCAP in the last 72 hours.  Wt Readings from Last 3 Encounters:  04/24/16 83.8 kg (184 lb 11.9 oz)  04/14/16 99.7 kg (219 lb 14.4 oz)  03/30/16 101.2 kg (223 lb)    Physical Exam:  Constitutional: He is oriented to person, place, and time. He appears well-developed and well-nourished.  HENT:  NCAT.  Mouth/Throat: Oropharynx is clear and moist.  Eyes: EOMI Neck: Normal range of motion. Neck supple.  Cardiovascular: RRR without murmur Respiratory: cta bilaterally GI: Soft. Bowel sounds are normal. He exhibits no distension. There is no tenderness.  Musculoskeletal: trace pedal edema B/L LE  Neurological: He is alert and oriented to person, place, and time.   Sensory loss from knees to feet is consistent with prior exams DTRs 1+ bilateral LE and UE    Motor: RUE/RLE: 4/5 proximal to distal LUE/4/5 proximal to distal   LLE 4/5 hf,ke and ADF/PF 3 to 3+/5.  Skin: skin intact GU: no urethral irritation Psychiatric: pleasant and appropriate  Assessment/Plan: 1. Functional and mobility deficits secondary to GBS which require 3+ hours per day of interdisciplinary therapy in a comprehensive inpatient rehab setting. Physiatrist is providing close team  supervision and 24 hour management of active medical problems listed below. Physiatrist and rehab team continue to assess barriers to discharge/monitor patient progress toward functional and medical goals.  Function:  Bathing Bathing position   Position: Shower  Bathing parts Body parts bathed by patient: Left arm, Right arm, Chest, Abdomen, Front perineal area, Buttocks, Right upper leg, Left upper leg, Right lower leg, Left lower leg Body parts bathed by helper: Back  Bathing assist Assist Level: Supervision or verbal cues      Upper Body Dressing/Undressing Upper body dressing   What is the patient wearing?: Pull over shirt/dress     Pull over shirt/dress - Perfomed by patient: Thread/unthread right sleeve, Thread/unthread left sleeve, Put head through opening, Pull shirt over trunk          Upper body assist Assist Level: More than reasonable time   Set up : To obtain clothing/put away  Lower Body Dressing/Undressing Lower body dressing   What is the patient wearing?: Pants, Non-skid slipper socks, Maryln Manuel, Underwear (hospital brief) Underwear - Performed by patient: Thread/unthread right underwear leg, Thread/unthread left underwear leg, Pull underwear up/down Underwear - Performed by helper: Pull underwear up/down Pants- Performed by patient: Thread/unthread right pants leg, Thread/unthread left pants leg, Pull pants up/down, Fasten/unfasten pants Pants- Performed by helper: Thread/unthread left pants leg, Pull pants up/down, Thread/unthread right pants leg Non-skid slipper socks- Performed by patient: Don/doff left sock, Don/doff right sock Non-skid slipper socks- Performed by  helper: Don/doff left sock               TED Hose - Performed by helper: Don/doff left TED hose, Don/doff right TED hose  Lower body assist Assist for lower body dressing: Set up   Set up : Don/doff TED stockings  Toileting Toileting Toileting activity did not occur: Safety/medical  concerns Toileting steps completed by patient: Adjust clothing prior to toileting, Adjust clothing after toileting Toileting steps completed by helper: Performs perineal hygiene Toileting Assistive Devices: Grab bar or rail  Toileting assist Assist level: Supervision or verbal cues   Transfers Chair/bed transfer   Chair/bed transfer method: Ambulatory Chair/bed transfer assist level: Supervision or verbal cues Chair/bed transfer assistive device: Medical sales representative     Max distance: 175 Assist level: Supervision or verbal cues   Wheelchair   Type: Manual Max wheelchair distance: 175 Assist Level: Supervision or verbal cues  Cognition Comprehension Comprehension assist level: Follows complex conversation/direction with extra time/assistive device  Expression Expression assist level: Expresses complex ideas: With extra time/assistive device  Social Interaction Social Interaction assist level: Interacts appropriately with others with medication or extra time (anti-anxiety, antidepressant).  Problem Solving Problem solving assist level: Solves basic problems with no assist  Memory Memory assist level: Requires cues to use assistive device   Medical Problem List and Plan: 1.  Weakness, gait abnormality, poor activity tolerance secondary to GBS (with history of GBS).  -continue CIR PT,OT 2.  DVT Prophylaxis/Anticoagulation:  Coumadin per pharm therapeutic 3. Pain Management: continue low dose Neurontin. Tylenol as needed 4. Mood: Motivated to work hard and get better. LCSW to follow for evaluation and support.  5. Neuropsych: This patient is capable of making decisions on his own behalf. 6. Skin/Wound Care: routine pressure relief measures.  7. Fluids/Electrolytes/Nutrition: Monitor I/O. Encouraging PO 8. HTN: Monitor BID.  metoprolol and cardizem Has orthostatic hypotension, but asymptomatic, urecholine contributing 9. A fib: in NSR- HR controlled on cardizem,  metoprolol and coumadin.   10. Recurrent meningiomas: Being monitored.  11. CKD:  Baseline Cr- 1.4-1.5. ----at 1.58 today, labs reviewed  -recheck bmet monday  -continue lasix 20mg  daily   - edema is sl decreased. Weight inaccurate today---reweigh   -elevation/compression  -continue lasix 20mg  daily  -had normal EF in october 12. Urinary retention: emptying bladder now   -  continue voiding trial PVR 0-64ml  -decrease urecholine to 10mg  TID  -I/O cath prn  -continue current flomax   -recent ucx negative    13. Leucocytosis:   -10.2   .  LOS (Days) 10 A FACE TO FACE EVALUATION WAS PERFORMED  Alysia Penna E 04/24/2016 7:20 AM

## 2016-04-24 NOTE — Progress Notes (Signed)
Physical Therapy Session Note  Patient Details  Name: [name] MRN: [id number] Date of Birth: [birth year]  Today's Date: 04/24/2016 PT Individual Time: 6720-9198 PT Individual Time Calculation (min): 60 min    Short Term Goals: Week 1:  PT Short Term Goal 1 (Week 1): Patient will perform transfers using RW with min A.  PT Short Term Goal 1 - Progress (Week 1): Met PT Short Term Goal 2 (Week 1): Patient will ambulate 150 ft with min A.  PT Short Term Goal 2 - Progress (Week 1): Met PT Short Term Goal 3 (Week 1): Patient will negotiate up/down 2 stairs using L rail with min A.  PT Short Term Goal 3 - Progress (Week 1): Met PT Short Term Goal 4 (Week 1): Patient will maintain dynamic standing balance x 3 min with min A.  PT Short Term Goal 4 - Progress (Week 1): Met PT Short Term Goal 5 (Week 1): Patient will perform bed mobility with HOB flat with supervision. PT Short Term Goal 5 - Progress (Week 1): Met  Skilled Therapeutic Interventions/Progress Updates:    Pt was seen bedside in the am. Pt performed supine to edge of bed, edge of bed to supine transfers with siderail, head of bed elevated and S. Pt performed all transfers with rolling walker and S. Pt ambulated 175 feet x 2 with rolling walker and S. Pt ascended/descended 4 stairs with R rail and min guard with verbal cues. In gym treatment focused on NMR, utilizing cone taps, alternating cone taps and criss cross cone taps, 3 sets x 10 reps each. Pt rode Nu-step 10 minutes a t level 3 with no rest breaks. Pt returned to room following treatment and left sitting up in recliner with all needs within reach.   Therapy Documentation Precautions:  Precautions Precautions: Fall Restrictions Weight Bearing Restrictions: No General:   Pain: No c/o pain.   See Function Navigator for Current Functional Status.   Therapy/Group: Individual Therapy  Dub Amis 04/24/2016, 12:46 PM

## 2016-04-24 NOTE — Progress Notes (Signed)
ANTICOAGULATION CONSULT NOTE - Follow Up Consult  Pharmacy Consult for coumadin Indication: afib and stroke  Patient Measurements: Height: 5\' 11"  (180.3 cm) Weight: 184 lb 11.9 oz (83.8 kg) IBW/kg (Calculated) : 75.3 Heparin Dosing Weight:   Vital Signs: Temp: 97.8 F (36.6 C) (12/09 0442) Temp Source: Oral (12/09 0442) BP: 133/71 (12/09 0946) Pulse Rate: 78 (12/09 0946)  Labs:  Recent Labs  04/22/16 0509 04/23/16 0440 04/24/16 0519  LABPROT 24.9* 26.1* 24.3*  INR 2.21 2.35 2.14  CREATININE  --  1.58*  --     Estimated Creatinine Clearance: 43 mL/min (by C-G formula based on SCr of 1.58 mg/dL (H)).  Assessment: 75 yo male with afib and stroke is currently on therapeutic coumadin.  INR today is 2.14  Goal of Therapy:  INR 2-3 Monitor platelets by anticoagulation protocol: Yes   Plan:  - Continue coumadin 5 mg po daily - INR MWF - CBC every 72 hours  Melburn Popper, PharmD Clinical Pharmacy Resident Pager: [phone] 04/24/16 10:12 AM

## 2016-04-25 DIAGNOSIS — R339 Retention of urine, unspecified: Secondary | ICD-10-CM | POA: Diagnosis not present

## 2016-04-25 DIAGNOSIS — N183 Chronic kidney disease, stage 3 (moderate): Secondary | ICD-10-CM | POA: Diagnosis not present

## 2016-04-25 DIAGNOSIS — I10 Essential (primary) hypertension: Secondary | ICD-10-CM | POA: Diagnosis not present

## 2016-04-25 DIAGNOSIS — I482 Chronic atrial fibrillation: Secondary | ICD-10-CM | POA: Diagnosis not present

## 2016-04-25 DIAGNOSIS — G610 Guillain-Barre syndrome: Secondary | ICD-10-CM | POA: Diagnosis not present

## 2016-04-25 MED ORDER — METOPROLOL TARTRATE 25 MG PO TABS
25.0000 mg | ORAL_TABLET | Freq: Two times a day (BID) | ORAL | Status: DC
  Administered 2016-04-26 – 2016-04-28 (×4): 25 mg via ORAL
  Filled 2016-04-25 (×7): qty 1

## 2016-04-25 NOTE — Progress Notes (Signed)
[place] PHYSICAL MEDICINE & REHABILITATION     PROGRESS NOTE    Subjective/Complaints: No ICP for 3d! Denies dizziness with standing   ROS: pt denies nausea, vomiting, diarrhea, cough, shortness of breath or chest pain     Objective: Vital Signs: Blood pressure (!) 112/55, pulse (!) 51, temperature 98.2 F (36.8 C), temperature source Oral, resp. rate 20, height 5\' 11"  (1.803 m), weight 87.4 kg (192 lb 10.9 oz), SpO2 98 %. No results found. No results for input(s): WBC, HGB, HCT, PLT in the last 72 hours.  Recent Labs  04/23/16 0440  NA 138  K 4.0  CL 104  GLUCOSE 108*  BUN 26*  CREATININE 1.58*  CALCIUM 8.9   CBG (last 3)  No results for input(s): GLUCAP in the last 72 hours.  Wt Readings from Last 3 Encounters:  04/25/16 87.4 kg (192 lb 10.9 oz)  04/14/16 99.7 kg (219 lb 14.4 oz)  03/30/16 101.2 kg (223 lb)    Physical Exam:  Constitutional: He is oriented to person, place, and time. He appears well-developed and well-nourished.  HENT:  NCAT.  Mouth/Throat: Oropharynx is clear and moist.  Eyes: EOMI Neck: Normal range of motion. Neck supple.  Cardiovascular: RRR without murmur Respiratory: cta bilaterally GI: Soft. Bowel sounds are normal. He exhibits no distension. There is no tenderness.  Musculoskeletal: trace pedal edema B/L LE  Neurological: He is alert and oriented to person, place, and time.   Sensory loss from knees to feet is consistent with prior exams DTRs 1+ bilateral LE and UE    Motor: RUE/RLE: 4/5 proximal to distal LUE/4/5 proximal to distal   LLE 4/5 hf,ke and ADF/PF 3 to 3+/5.  Skin: skin intact GU: no urethral irritation Psychiatric: pleasant and appropriate  Assessment/Plan: 1. Functional and mobility deficits secondary to GBS which require 3+ hours per day of interdisciplinary therapy in a comprehensive inpatient rehab setting. Physiatrist is providing close team supervision and 24 hour management of active medical problems  listed below. Physiatrist and rehab team continue to assess barriers to discharge/monitor patient progress toward functional and medical goals.  Function:  Bathing Bathing position   Position: Shower  Bathing parts Body parts bathed by patient: Left arm, Right arm, Chest, Abdomen, Front perineal area, Buttocks, Right upper leg, Left upper leg, Right lower leg, Left lower leg Body parts bathed by helper: Back  Bathing assist Assist Level: Supervision or verbal cues      Upper Body Dressing/Undressing Upper body dressing   What is the patient wearing?: Pull over shirt/dress     Pull over shirt/dress - Perfomed by patient: Thread/unthread right sleeve, Thread/unthread left sleeve, Put head through opening, Pull shirt over trunk          Upper body assist Assist Level: No help, No cues   Set up : To obtain clothing/put away  Lower Body Dressing/Undressing Lower body dressing   What is the patient wearing?: Pants, Non-skid slipper socks, Maryln Manuel, Underwear (hospital brief) Underwear - Performed by patient: Thread/unthread right underwear leg, Thread/unthread left underwear leg, Pull underwear up/down Underwear - Performed by helper: Pull underwear up/down Pants- Performed by patient: Thread/unthread right pants leg, Thread/unthread left pants leg, Pull pants up/down, Fasten/unfasten pants Pants- Performed by helper: Thread/unthread left pants leg, Pull pants up/down, Thread/unthread right pants leg Non-skid slipper socks- Performed by patient: Don/doff left sock, Don/doff right sock Non-skid slipper socks- Performed by helper: Don/doff left sock  TED Hose - Performed by helper: Don/doff left TED hose, Don/doff right TED hose  Lower body assist Assist for lower body dressing: Set up   Set up : Don/doff TED stockings  Toileting Toileting Toileting activity did not occur: Safety/medical concerns Toileting steps completed by patient: Adjust clothing prior to  toileting, Adjust clothing after toileting Toileting steps completed by helper: Performs perineal hygiene Toileting Assistive Devices: Grab bar or rail  Toileting assist Assist level: Supervision or verbal cues   Transfers Chair/bed transfer   Chair/bed transfer method: Ambulatory Chair/bed transfer assist level: Supervision or verbal cues Chair/bed transfer assistive device: Medical sales representative     Max distance: 175 Assist level: Supervision or verbal cues   Wheelchair   Type: Manual Max wheelchair distance: 175 Assist Level: Supervision or verbal cues  Cognition Comprehension Comprehension assist level: Follows complex conversation/direction with extra time/assistive device  Expression Expression assist level: Expresses complex ideas: With extra time/assistive device  Social Interaction Social Interaction assist level: Interacts appropriately with others with medication or extra time (anti-anxiety, antidepressant).  Problem Solving Problem solving assist level: Solves basic problems with no assist  Memory Memory assist level: Requires cues to use assistive device   Medical Problem List and Plan: 1.  Weakness, gait abnormality, poor activity tolerance secondary to GBS (with history of GBS).  -continue CIR PT,OT 2.  DVT Prophylaxis/Anticoagulation:  Coumadin per pharm therapeutic, for Afib 3. Pain Management: continue low dose Neurontin. Tylenol as needed 4. Mood: Motivated to work hard and get better. LCSW to follow for evaluation and support.  5. Neuropsych: This patient is capable of making decisions on his own behalf. 6. Skin/Wound Care: routine pressure relief measures.  7. Fluids/Electrolytes/Nutrition: Monitor I/O. Encouraging PO 8. HTN: Monitor BID.  metoprolol and cardizem Has orthostatic hypotension, but asymptomatic, urecholine and flomax contributing 9. A fib: in NSR- HR controlled but on low side reduced metoprolol , off cardizem 10. Recurrent  meningiomas: Being monitored.  11. CKD:  Baseline Cr- 1.4-1.5. ----at 1.58 today, labs reviewed  -recheck bmet monday  -continue lasix 20mg  daily   - edema is sl decreased. Weight inaccurate today---reweigh   -elevation/compression  -continue lasix 20mg  daily  -had normal EF in october 12. Urinary retention: emptying bladder now   -  continue voiding trial PVR 0-11ml  -d/c urecholine   -I/O cath prn  -continue current flomax home med  -recent ucx negative    13. Leucocytosis:   -10.2   .  LOS (Days) 11 A FACE TO FACE EVALUATION WAS PERFORMED  Charlett Blake 04/25/2016 7:38 AM

## 2016-04-26 ENCOUNTER — Inpatient Hospital Stay (HOSPITAL_COMMUNITY): Payer: PPO | Admitting: Occupational Therapy

## 2016-04-26 ENCOUNTER — Inpatient Hospital Stay (HOSPITAL_COMMUNITY): Payer: PPO | Admitting: Physical Therapy

## 2016-04-26 DIAGNOSIS — I4891 Unspecified atrial fibrillation: Secondary | ICD-10-CM

## 2016-04-26 DIAGNOSIS — R339 Retention of urine, unspecified: Secondary | ICD-10-CM | POA: Diagnosis not present

## 2016-04-26 DIAGNOSIS — G610 Guillain-Barre syndrome: Secondary | ICD-10-CM | POA: Diagnosis not present

## 2016-04-26 DIAGNOSIS — N183 Chronic kidney disease, stage 3 (moderate): Secondary | ICD-10-CM | POA: Diagnosis not present

## 2016-04-26 LAB — BASIC METABOLIC PANEL
ANION GAP: 9 (ref 5–15)
BUN: 21 mg/dL — ABNORMAL HIGH (ref 6–20)
CALCIUM: 8.9 mg/dL (ref 8.9–10.3)
CO2: 26 mmol/L (ref 22–32)
Chloride: 103 mmol/L (ref 101–111)
Creatinine, Ser: 1.59 mg/dL — ABNORMAL HIGH (ref 0.61–1.24)
GFR calc non Af Amer: 41 mL/min — ABNORMAL LOW (ref >60)
GFR, EST AFRICAN AMERICAN: 47 mL/min — AB (ref >60)
Glucose, Bld: 109 mg/dL — ABNORMAL HIGH (ref 65–99)
Potassium: 4.2 mmol/L (ref 3.5–5.1)
Sodium: 138 mmol/L (ref 135–145)

## 2016-04-26 LAB — PROTIME-INR
INR: 2.19
PROTHROMBIN TIME: 24.7 s — AB (ref 11.4–15.2)

## 2016-04-26 MED ORDER — BETHANECHOL CHLORIDE 10 MG PO TABS
10.0000 mg | ORAL_TABLET | Freq: Three times a day (TID) | ORAL | Status: DC
  Administered 2016-04-26 – 2016-04-27 (×4): 10 mg via ORAL
  Filled 2016-04-26 (×4): qty 1

## 2016-04-26 NOTE — Progress Notes (Signed)
ANTICOAGULATION CONSULT NOTE - Follow Up Consult  Pharmacy Consult for coumadin Indication: afib and stroke  Patient Measurements: Height: 5\' 11"  (180.3 cm) Weight: 193 lb 9 oz (87.8 kg) IBW/kg (Calculated) : 75.3   Vital Signs: Temp: 98.3 F (36.8 C) (12/11 0545) Temp Source: Oral (12/11 0545) BP: 113/56 (12/11 0922) Pulse Rate: 83 (12/11 0922)  Labs:  Recent Labs  04/24/16 0519 04/26/16 0626  LABPROT 24.3* 24.7*  INR 2.14 2.19  CREATININE  --  1.59*    Estimated Creatinine Clearance: 42.8 mL/min (by C-G formula based on SCr of 1.59 mg/dL (H)).  Assessment: 75 yo male with afib and stroke is currently on coumadin.  INR today is 2.19, remains therapeutic. No bleeding reported. Coumadin education completed on 04/08/16 prior to transfer to rehab unit.  Goal of Therapy:  INR 2-3 Monitor platelets by anticoagulation protocol: Yes   Plan:  - Continue coumadin 5 mg po daily - INR MWF - CBC every 72 hours  Nicole Cella, RPh Clinical Pharmacist Pager: [phone] 04/26/16 1:53 PM

## 2016-04-26 NOTE — Progress Notes (Signed)
Occupational Therapy Session Note  Patient Details  Name: [name] MRN: [id number] Date of Birth: 1940-06-27  Today's Date: 04/26/2016 OT Individual Time: 4287-6811 OT Individual Time Calculation (min): 70 min     Short Term Goals:Week 1:  OT Short Term Goal 1 (Week 1): Pt will complete LB bathing with supervision sit to stand 2 consecutive sessions.  OT Short Term Goal 1 - Progress (Week 1): Met OT Short Term Goal 2 (Week 1): Pt will perform tub/shower transfers at supervision level.  OT Short Term Goal 2 - Progress (Week 1): Met OT Short Term Goal 3 (Week 1): Pt will perform LB dressing sit to stand with min supervision.  OT Short Term Goal 3 - Progress (Week 1): Met OT Short Term Goal 4 (Week 1): Pt will tolerate standing for 3 mins with unilateral support of UEs at supervision level during grooming tasks.   OT Short Term Goal 4 - Progress (Week 1): Met Week 2:  OT Short Term Goal 1 (Week 2): STG=LTG due to LOS      Skilled Therapeutic Interventions/Progress Updates:    Pt seen this session for a focus on dynamic balance, standing tolerance, and UB strength.  Pt declined shower/dressing until later session. Used RW for support with toileting and then ambulated to sink for grooming.  Pt ambulated to gym to work on sit to stand from various heights without UE support holding ball in hands. Pt completed 10 reps, 5 sets with short rest breaks in between.  Standing with one hand on RW for support placed at his side with stepping forward onto target at various angles. Pt slightly limited by R knee pain.   Worked on UB strength and postural strength with orange theraband for horizontal shoulder abduction and shoulder retraction. 3# dowel bar for shoulder and chest presses 10 x 3, followed by rowing circles forward 10, back 10 3 sets.   Pt ambulated to day room with RW with cues for upright posture, rested 2-3 min and then ambulated back to his room with RW with S. Pt in recliner with  all needs met.    Therapy Documentation Precautions:  Precautions Precautions: Fall Restrictions Weight Bearing Restrictions: No    Vital Signs: Therapy Vitals Temp: 98.3 F (36.8 C) Temp Source: Oral Pulse Rate: 83 Resp: 18 BP: (!) 113/56 Patient Position (if appropriate): Sitting Oxygen Therapy SpO2: 95 % O2 Device: Not Delivered Pain: Pain Assessment Pain Assessment: No/denies pain ADL:    See Function Navigator for Current Functional Status.   Therapy/Group: Individual Therapy  SAGUIER,JULIA 04/26/2016, 9:38 AM

## 2016-04-26 NOTE — Progress Notes (Signed)
[facility] PHYSICAL MEDICINE & REHABILITATION     PROGRESS NOTE    Subjective/Complaints: Had to be cathed twice late last night and this AM. Disappointed  ROS: pt denies nausea, vomiting, diarrhea, cough, shortness of breath or chest pain     Objective: Vital Signs: Blood pressure (!) 131/58, pulse 63, temperature 98.3 F (36.8 C), temperature source Oral, resp. rate 18, height 5\' 11"  (1.803 m), weight 87.8 kg (193 lb 9 oz), SpO2 94 %. No results found. No results for input(s): WBC, HGB, HCT, PLT in the last 72 hours.  Recent Labs  04/26/16 0626  NA 138  K 4.2  CL 103  GLUCOSE 109*  BUN 21*  CREATININE 1.59*  CALCIUM 8.9   CBG (last 3)  No results for input(s): GLUCAP in the last 72 hours.  Wt Readings from Last 3 Encounters:  04/26/16 87.8 kg (193 lb 9 oz)  04/14/16 99.7 kg (219 lb 14.4 oz)  03/30/16 101.2 kg (223 lb)    Physical Exam:  Constitutional: He is oriented to person, place, and time. He appears well-developed and well-nourished.  HENT:  NCAT.  Mouth/Throat: Oropharynx is clear and moist.  Eyes: PERRL Neck: Normal range of motion. Neck supple.  Cardiovascular: RRR without murmur Respiratory: cta bilaterally GI: Soft. Bowel sounds are normal. He exhibits no distension. There is no tenderness.  Musculoskeletal: trace pedal edema B/L LE  Neurological: He is alert and oriented to person, place, and time.   Sensory loss from knees to feet to LT/propriocetpion DTRs 1+ bilateral LE and UE    Motor: RUE/RLE: 4/5 proximal to distal LUE/4/5 proximal to distal   LLE 4/5 hf,ke and ADF/PF 3 to 3+/5.  Skin: skin intact GU: no urethral irritation Psychiatric: pleasant and appropriate  Assessment/Plan: 1. Functional and mobility deficits secondary to GBS which require 3+ hours per day of interdisciplinary therapy in a comprehensive inpatient rehab setting. Physiatrist is providing close team supervision and 24 hour management of active medical problems  listed below. Physiatrist and rehab team continue to assess barriers to discharge/monitor patient progress toward functional and medical goals.  Function:  Bathing Bathing position   Position: Shower  Bathing parts Body parts bathed by patient: Left arm, Right arm, Chest, Abdomen, Front perineal area, Buttocks, Right upper leg, Left upper leg, Right lower leg, Left lower leg Body parts bathed by helper: Back  Bathing assist Assist Level: Supervision or verbal cues      Upper Body Dressing/Undressing Upper body dressing   What is the patient wearing?: Pull over shirt/dress     Pull over shirt/dress - Perfomed by patient: Thread/unthread right sleeve, Thread/unthread left sleeve, Put head through opening, Pull shirt over trunk          Upper body assist Assist Level: No help, No cues   Set up : To obtain clothing/put away  Lower Body Dressing/Undressing Lower body dressing   What is the patient wearing?: Maryln Manuel, Shoes Underwear - Performed by patient: Thread/unthread right underwear leg, Thread/unthread left underwear leg, Pull underwear up/down Underwear - Performed by helper: Pull underwear up/down Pants- Performed by patient: Thread/unthread right pants leg, Thread/unthread left pants leg, Pull pants up/down, Fasten/unfasten pants Pants- Performed by helper: Thread/unthread left pants leg, Pull pants up/down, Thread/unthread right pants leg Non-skid slipper socks- Performed by patient: Don/doff left sock, Don/doff right sock Non-skid slipper socks- Performed by helper: Don/doff left sock     Shoes - Performed by patient: Don/doff right shoe, Don/doff left shoe, Fasten right,  Fasten left         TED Hose - Performed by helper: Don/doff right TED hose, Don/doff left TED hose  Lower body assist Assist for lower body dressing: Set up   Set up : Don/doff TED stockings, To obtain clothing/put away  Toileting Toileting Toileting activity did not occur: Safety/medical  concerns Toileting steps completed by patient: Adjust clothing prior to toileting, Adjust clothing after toileting, Performs perineal hygiene Toileting steps completed by helper: Performs perineal hygiene Toileting Assistive Devices: Grab bar or rail  Toileting assist Assist level: Supervision or verbal cues   Transfers Chair/bed transfer   Chair/bed transfer method: Ambulatory Chair/bed transfer assist level: Supervision or verbal cues Chair/bed transfer assistive device: Medical sales representative     Max distance: 175 Assist level: Supervision or verbal cues   Wheelchair   Type: Manual Max wheelchair distance: 175 Assist Level: Supervision or verbal cues  Cognition Comprehension Comprehension assist level: Follows complex conversation/direction with extra time/assistive device  Expression Expression assist level: Expresses complex ideas: With extra time/assistive device  Social Interaction Social Interaction assist level: Interacts appropriately with others with medication or extra time (anti-anxiety, antidepressant).  Problem Solving Problem solving assist level: Solves basic problems with no assist  Memory Memory assist level: Requires cues to use assistive device   Medical Problem List and Plan: 1.  Weakness, gait abnormality, poor activity tolerance secondary to GBS (with history of GBS).  -continue CIR PT,OT 2.  DVT Prophylaxis/Anticoagulation:  Coumadin per pharm therapeutic, for Afib 3. Pain Management: continue low dose Neurontin. Tylenol as needed 4. Mood: Motivated to work hard and get better. LCSW to follow for evaluation and support.  5. Neuropsych: This patient is capable of making decisions on his own behalf. 6. Skin/Wound Care: routine pressure relief measures.  7. Fluids/Electrolytes/Nutrition: Monitor I/O. Encouraging PO 8. HTN: Monitor BID.  metoprolol and cardizem Has orthostatic hypotension, but asymptomatic, urecholine and flomax contributing 9. A  fib: in NSR- HR controlled but on low side reduced metoprolol , off cardizem 10. Recurrent meningiomas: Being monitored.  11. CKD:  Baseline Cr- 1.4-1.5. ----at 1.59 today, labs reviewed personally  -continue lasix 20mg  daily   - edema is decr. Weight 87kg  -elevation/compression  -continue lasix 20mg  daily  -had normal EF in october 12. Urinary retention: emptying bladder now   -  continue voiding trial PVR 0-86ml  -resume low dose urecholine   -I/O cath prn--he and wife need to be familiar with technique  -continue current flomax home med  -recent ucx negative    13. Leucocytosis:   -10.2   .  LOS (Days) 12 A FACE TO FACE EVALUATION WAS PERFORMED  Celena Lanius T 04/26/2016 8:50 AM

## 2016-04-26 NOTE — Progress Notes (Signed)
Physical Therapy Session Note  Patient Details  Name: [name] MRN: [id number] Date of Birth: April 10, 1941  Today's Date: 04/26/2016 PT Individual Time: 0800-0900 PT Individual Time Calculation (min): 60 min    Short Term Goals: Week 2:  PT Short Term Goal 1 (Week 2): =LTG due to estimated LOS, S overall  Skilled Therapeutic Interventions/Progress Updates:   Pt received supine in bed, denies pain and agreeable to treatment. Bed mobility to sit on EOB with modI. Therapist donned TEDs totalA, pt doffed socks and donned shoes with setupA. Pt ambulated in/out of bathroom with RW and S; performed clothing management and hygiene with distant S. Pt unable to void at this time. Gait to gym with RW and S x175'. Gait training with quad cane and SPC. Requires S for gait with straight trajectory and minimal distractions when using both quad cane and SPC. When performing obstacle course, head turns, and cognitive dual task pt with several minor LOBs requiring S>minA to recover balance. Educated pt on use of SPC or no AD in therapy only to continue training balance strategies, but recommendation that pt use RW when d/c home due to high falls risk and continue working on progressing balance/ AD with HHPT.; pt verbalizes understanding. 4 square step test performed x3 trials with min guard throughout and several LOBs due to insufficient stepping strategy and pt frequently stepping on poles requiring modA to correct. Returned to room with gait as above d/t pt feeling like his brief was falling off. In restroom, attempted again to use restroom unsuccessfully. Required assist to don new brief. Sit <>stand from recliner 2x10 reps with light UE support on first trial, no UE support second trial and S for both. Dynamic gait within room using RW to remove sheets/pillowcases from bed; required S overall and no cues needed for safe management of RW in dynamic environment. Remained seated in recliner at end of session,  all needs in reach.   Therapy Documentation Precautions:  Precautions Precautions: Fall Restrictions Weight Bearing Restrictions: No Pain: Pain Assessment Pain Assessment: No/denies pain   See Function Navigator for Current Functional Status.   Therapy/Group: Individual Therapy  Luberta Mutter 04/26/2016, 8:48 AM

## 2016-04-26 NOTE — Progress Notes (Signed)
Occupational Therapy Session Note  Patient Details  Name: [name] MRN: [id number] Date of Birth: March 25, 1941  Today's Date: 04/26/2016 OT Individual Time: 1300-1400 OT Individual Time Calculation (min): 60 min     Short Term Goals:Week 2:  OT Short Term Goal 1 (Week 2): STG=LTG due to LOS  Skilled Therapeutic Interventions/Progress Updates:    Pt seen for OT ADL bathing/dressing session as well as emphasis on family education. Pt sitting up in recliner upon arrival, agreeable to tx session. Pt's wife present and attended therapy session.  Pt completed showering activity in ADL apartment utilizing tub/shower combination and tub transfer bench. Completed transfer and bathing task with supervision. Heavy reliance on grab bars, recommended to wife getting grab bars installed. Also, recommended utilizing lateral leans for buttock hygiene to reduce dynamic standing/ fall risk in shower. He dressed seated on Edge of bench, standing with RW to pull pants up. Demonstration and education provided to wife regarding set-up of tub transfer bench. Pt returned to room and completed standing grooming task with supervision. Pt returned to recliner, left with all needs in reach.  Educated throughout session regarding fall risk, DME, assist recommendations, continuum of care, and d/c planning.   Therapy Documentation Precautions:  Precautions Precautions: Fall Restrictions Weight Bearing Restrictions: No Pain:   No/ denies pain  See Function Navigator for Current Functional Status.   Therapy/Group: Individual Therapy  Lewis, Dirk Vanaman C 04/26/2016, 7:14 AM

## 2016-04-27 ENCOUNTER — Inpatient Hospital Stay (HOSPITAL_COMMUNITY): Payer: PPO | Admitting: Physical Therapy

## 2016-04-27 ENCOUNTER — Inpatient Hospital Stay (HOSPITAL_COMMUNITY): Payer: PPO | Admitting: Occupational Therapy

## 2016-04-27 DIAGNOSIS — I482 Chronic atrial fibrillation: Secondary | ICD-10-CM | POA: Diagnosis not present

## 2016-04-27 DIAGNOSIS — R339 Retention of urine, unspecified: Secondary | ICD-10-CM | POA: Diagnosis not present

## 2016-04-27 DIAGNOSIS — G610 Guillain-Barre syndrome: Secondary | ICD-10-CM | POA: Diagnosis not present

## 2016-04-27 MED ORDER — BETHANECHOL CHLORIDE 25 MG PO TABS: 25.0000 mg | ORAL_TABLET | Freq: Three times a day (TID) | ORAL | Status: DC

## 2016-04-27 MED ORDER — ACETAMINOPHEN 325 MG PO TABS: 325.0000 mg | ORAL_TABLET | ORAL | Status: DC | PRN

## 2016-04-27 MED ORDER — BETHANECHOL CHLORIDE 10 MG PO TABS: 10.0000 mg | ORAL_TABLET | Freq: Once | ORAL | Status: AC

## 2016-04-27 MED ORDER — LIDOCAINE HCL 2 % EX GEL
1.0000 "application " | Freq: Once | CUTANEOUS | Status: DC
  Filled 2016-04-27: qty 5

## 2016-04-27 MED ORDER — BETHANECHOL CHLORIDE 25 MG PO TABS
25.0000 mg | ORAL_TABLET | Freq: Three times a day (TID) | ORAL | Status: DC
  Administered 2016-04-27 – 2016-04-28 (×3): 25 mg via ORAL
  Filled 2016-04-27 (×4): qty 1

## 2016-04-27 NOTE — Patient Care Conference (Signed)
Inpatient RehabilitationTeam Conference and Plan of Care Update Date: 04/27/2016   Time: 2:05 pm    Patient Name: [name]      Medical Record Number: [id number]  Date of Birth: 1940/12/01 Sex: Male         Room/Bed: 4M07C/4M07C-01 Payor Info: Payor: [employer] / Plan: [employer] / Product Type: *No Product type* /    Admitting Diagnosis: GBS  Admit Date/Time:  04/14/2016  6:09 PM Admission Comments: No comment available   Primary Diagnosis:  GBS (Guillain Barre syndrome) ([place]) Principal Problem: GBS (Guillain Barre syndrome) [facility])  Patient Active Problem List   Diagnosis Date Noted  . GBS (Guillain Barre syndrome) ([place]) 04/14/2016  . Neuropathic pain   . Leukocytosis   . Chronic anticoagulation   . Benign essential HTN   . Stage 3 chronic kidney disease   . PAF (paroxysmal atrial fibrillation) ([place])   . History of meningioma   . Urinary retention   . History of Guillain-Barre syndrome   . Weakness of both lower extremities   . Leg weakness 04/04/2016  . Guillain Barr syndrome ([place]) 04/04/2016  . Cellulitis of left knee 03/12/2016  . Atrial fibrillation, new onset ([place]) 03/07/2016  . Urinary tract infection without hematuria 03/07/2016  . Renal insufficiency 03/07/2016  . Hypokalemia 03/07/2016  . History of CVA (cerebrovascular accident) 03/07/2016  . HTN (hypertension) 03/07/2016  . Atrial fibrillation ([place]) 03/07/2016  . Acute urinary retention   . Occlusion and stenosis of carotid artery without mention of cerebral infarction 07/11/2012  . Carotid stenosis 07/11/2012    Expected Discharge Date: Expected Discharge Date: 04/28/16  Team Members Present: Physician leading conference: Dr. Alger Simons Social Worker Present: Lennart Pall, LCSW Nurse Present: Heather Roberts, RN PT Present: Kem Parkinson, PT OT Present: Napoleon Form, OT SLP Present: Gunnar Fusi, SLP PPS Coordinator present : Daiva Nakayama, RN, CRRN     Current  Status/Progress Goal Weekly Team Focus  Medical   bladder with some retention. pain controlled  improve bladder emptying  emptying bladder, i/o cath prn   Bowel/Bladder   Cont B/B. urinary retention. LBM 04/25/16  Assess urine retention, Post PVR, I/O cath as need   Educate pt/caregiver I/O cath, Assist with toileting needs (bladder program)    Swallow/Nutrition/ Hydration             ADL's   Supervision overall, occasional min A with LOB  Supervision overall  d/c planning, family ed, planned d/c home tomorrow   Mobility   S overall  Supervision overall  Pt/family education, activity tolerance, gait training   Communication             Safety/Cognition/ Behavioral Observations  Safety  Awareness  Keep call light at reach, bed alarm pm, and walker for ambulation  Prevention of Falls    Pain   No pain  Assess pain qshift and as needed, provide pain regimen when needed.  Pain address    Skin   Skin tear left F.A. with pink foam dsg  Address any other skin issues, and maintain skin integrity   Assess skin qshift and as needed       *See Care Plan and progress notes for long and short-term goals.  Barriers to Discharge: bladder emptying    Possible Resolutions to Barriers:  urecholine, i/ocath training    Discharge Planning/Teaching Needs:  Plan home with wife who can provide 24/7 assistance.    Teaching is ongoing.  I/O cath education underway.  Team Discussion:  Still with bladder issues and will educate wife on I/O caths this afternoon.  Family educated completed with therapies.  Meeting all supervision goals and ready for d/c tomorrow.  Revisions to Treatment Plan:  None   Continued Need for Acute Rehabilitation Level of Care: The patient requires daily medical management by a physician with specialized training in physical medicine and rehabilitation for the following conditions: Daily direction of a multidisciplinary physical rehabilitation program to ensure safe  treatment while eliciting the highest outcome that is of practical value to the patient.: Yes Daily medical management of patient stability for increased activity during participation in an intensive rehabilitation regime.: Yes Daily analysis of laboratory values and/or radiology reports with any subsequent need for medication adjustment of medical intervention for : Neurological problems;Urological problems  [name] 04/29/2016, 6:26 AM

## 2016-04-27 NOTE — Progress Notes (Signed)
1800:  Pt did not require any I/O caths this shift. Voids ranged in volume 125-175cc with PVRs 0-114. See FS. Wife educated with equipment, procedure.  Will complete cath in AM.

## 2016-04-27 NOTE — Progress Notes (Signed)
Occupational Therapy Session Note  Patient Details  Name: [name] MRN: [id number] Date of Birth: 06/14/1940  Today's Date: 04/27/2016 OT Individual Time: 0800-0900 OT Individual Time Calculation (min): 60 min     Short Term Goals:Week 2:  OT Short Term Goal 1 (Week 2): STG=LTG due to LOS  Skilled Therapeutic Interventions/Progress Updates:    Pt seen for OT ADL bathing/dressing session. Pt in supine upon arrival, agreeable to tx session. He ambulated throughout session with RW and distant supervision. He gathered clothing items in prep for shower, bending over to low drawers to obtain clothing. He bathed seated on tub bench with distant supervision, standing with use of grab bars to complete buttock hygiene/ pericare.  He dressed seated in recliner with increased time, TEDs donned total A.  He ambulated to sink and completed grooming tasks standing at sink supervision- mod I.  He ambulated to ADL apartment where we discussed placement and recommendations for grab bar placement.  Practiced ambulation without AD in hallway utilizing hallway rail. Completed with CGA and one UE support. Pt ambulated back to room at end of session, left seated in recliner with all needs in reach. Educated throughout session regarding fall risk, decreased sensation and functional implications, DME, and d/c planning. Pt voiced ffeeling comfortable and confident with planned d/c home tomorrow, hoping he wont need to be cathed!  Therapy Documentation Precautions:  Precautions Precautions: Fall Restrictions Weight Bearing Restrictions: No Pain:   No/denies pain  See Function Navigator for Current Functional Status.   Therapy/Group: Individual Therapy  Lewis, Jakerra Floyd C 04/27/2016, 6:39 AM

## 2016-04-27 NOTE — Progress Notes (Signed)
Recreational Therapy Session Note  Patient Details  Name: [name] MRN: [id number] Date of Birth: [birth year] Today's Date: 04/27/2016  Pain: no c/o Skilled Therapeutic Interventions/Progress Updates: Session focused on discharge planning, use of leisure time post discharge and community pursuits.  Pt ambulated with RW with supervision, contact guard assist for side stepping to negotiation obstacles.  Therapy/Group: Co-Treatment   Chen Saadeh 04/27/2016, 11:20 AM

## 2016-04-27 NOTE — Progress Notes (Signed)
[facility] PHYSICAL MEDICINE & REHABILITATION     PROGRESS NOTE    Subjective/Complaints: Had to be cathed this morning. Having difficulty when he is able to empty as well.  ROS: pt denies nausea, vomiting, diarrhea, cough, shortness of breath or chest pain     Objective: Vital Signs: Blood pressure (!) 105/49, pulse (!) 59, temperature 97.6 F (36.4 C), temperature source Oral, resp. rate 19, height 5\' 11"  (1.803 m), weight 90.8 kg (200 lb 2.8 oz), SpO2 96 %. No results found. No results for input(s): WBC, HGB, HCT, PLT in the last 72 hours.  Recent Labs  04/26/16 0626  NA 138  K 4.2  CL 103  GLUCOSE 109*  BUN 21*  CREATININE 1.59*  CALCIUM 8.9   CBG (last 3)  No results for input(s): GLUCAP in the last 72 hours.  Wt Readings from Last 3 Encounters:  04/27/16 90.8 kg (200 lb 2.8 oz)  04/14/16 99.7 kg (219 lb 14.4 oz)  03/30/16 101.2 kg (223 lb)    Physical Exam:  Constitutional: He is oriented to person, place, and time. He appears well-developed and well-nourished.  HENT:  NCAT.  Mouth/Throat: Oropharynx is clear and moist.  Eyes: PERRL Neck: Normal range of motion. Neck supple.  Cardiovascular: RRR without murmur Respiratory: cta bilaterally GI: Soft. Bowel sounds are normal. He exhibits no distension. There is no tenderness.  Musculoskeletal: no pedal edema B/L LE  Neurological: He is alert and oriented to person, place, and time.   Sensory loss from knees to feet to LT/propriocetpion--no change DTRs 1+ bilateral LE and UE    Motor: RUE/RLE: 4/5 proximal to distal LUE/4/5 proximal to distal   LLE 4/5 hf,ke and ADF/PF 3 to 3+/5.  Skin: skin intact GU: no urethral irritation Psychiatric: pleasant and appropriate  Assessment/Plan: 1. Functional and mobility deficits secondary to GBS which require 3+ hours per day of interdisciplinary therapy in a comprehensive inpatient rehab setting. Physiatrist is providing close team supervision and 24 hour  management of active medical problems listed below. Physiatrist and rehab team continue to assess barriers to discharge/monitor patient progress toward functional and medical goals.  Function:  Bathing Bathing position   Position: Shower  Bathing parts Body parts bathed by patient: Left arm, Right arm, Chest, Abdomen, Front perineal area, Buttocks, Right upper leg, Left upper leg, Right lower leg, Left lower leg, Back Body parts bathed by helper: Back  Bathing assist Assist Level: Supervision or verbal cues      Upper Body Dressing/Undressing Upper body dressing   What is the patient wearing?: Pull over shirt/dress     Pull over shirt/dress - Perfomed by patient: Thread/unthread right sleeve, Thread/unthread left sleeve, Put head through opening, Pull shirt over trunk          Upper body assist Assist Level: No help, No cues   Set up : To obtain clothing/put away  Lower Body Dressing/Undressing Lower body dressing   What is the patient wearing?: Maryln Manuel, Underwear, Pants Underwear - Performed by patient: Thread/unthread right underwear leg, Thread/unthread left underwear leg, Pull underwear up/down Underwear - Performed by helper: Pull underwear up/down Pants- Performed by patient: Thread/unthread right pants leg, Thread/unthread left pants leg, Pull pants up/down, Fasten/unfasten pants Pants- Performed by helper: Thread/unthread left pants leg, Pull pants up/down, Thread/unthread right pants leg Non-skid slipper socks- Performed by patient: Don/doff left sock, Don/doff right sock Non-skid slipper socks- Performed by helper: Don/doff left sock     Shoes - Performed by patient:  Don/doff right shoe, Don/doff left shoe, Fasten right, Fasten left         TED Hose - Performed by helper: Don/doff right TED hose, Don/doff left TED hose  Lower body assist Assist for lower body dressing: Supervision or verbal cues   Set up : Don/doff TED stockings  Toileting Toileting Toileting  activity did not occur: Safety/medical concerns Toileting steps completed by patient: Adjust clothing prior to toileting, Performs perineal hygiene, Adjust clothing after toileting Toileting steps completed by helper: Performs perineal hygiene Toileting Assistive Devices: Grab bar or rail  Toileting assist Assist level: Supervision or verbal cues   Transfers Chair/bed transfer   Chair/bed transfer method: Ambulatory Chair/bed transfer assist level: Supervision or verbal cues Chair/bed transfer assistive device: Medical sales representative     Max distance: 175 Assist level: Supervision or verbal cues   Wheelchair   Type: Manual Max wheelchair distance: 175 Assist Level: Supervision or verbal cues  Cognition Comprehension Comprehension assist level: Follows complex conversation/direction with no assist  Expression Expression assist level: Expresses complex ideas: With no assist  Social Interaction Social Interaction assist level: Interacts appropriately with others - No medications needed.  Problem Solving Problem solving assist level: Solves complex problems: Recognizes & self-corrects  Memory Memory assist level: Complete Independence: No helper   Medical Problem List and Plan: 1.  Weakness, gait abnormality, poor activity tolerance secondary to GBS (with history of GBS).  -continue CIR PT,OT  -team conference today 2.  DVT Prophylaxis/Anticoagulation:  Coumadin per pharm therapeutic, for Afib 3. Pain Management: continue low dose Neurontin. Tylenol as needed 4. Mood: Motivated to work hard and get better. LCSW to follow for evaluation and support.  5. Neuropsych: This patient is capable of making decisions on his own behalf. 6. Skin/Wound Care: routine pressure relief measures.  7. Fluids/Electrolytes/Nutrition: Monitor I/O. Encouraging PO 8. HTN: Monitor BID.  metoprolol and cardizem Has orthostatic hypotension, but asymptomatic,   flomax contributing  9. A fib: in  NSR- HR controlled but on low side reduced metoprolol , off cardizem 10. Recurrent meningiomas: Being monitored.  11. CKD:  Baseline Cr- 1.4-1.5. ----at 1.59 today, labs reviewed personally  - dc lasix 20mg  daily   - edema is decr. Weight 87kg  -elevation/compression  -had normal EF in october 12. Urinary retention: e   -resumed low dose urecholine --increase to 25mg  tid  -will have to deal with some incontinence perhaps  -I/O cath prn--he and wife need to be familiar with technique--need to learn today  -continue current flomax home med  -recent ucx negative    13. Leucocytosis:   -10.2   .  LOS (Days) 13 A FACE TO FACE EVALUATION WAS PERFORMED  [name] 04/27/2016 8:36 AM

## 2016-04-27 NOTE — Progress Notes (Signed)
Physical Therapy Discharge Summary  Patient Details  Name: [name] MRN: [id number] Date of Birth: 1941/03/03  Today's Date: 04/27/2016   Patient has met 8 of 8 long term goals due to improved activity tolerance, improved balance, improved postural control, increased strength, increased range of motion, decreased pain, ability to compensate for deficits, functional use of  right lower extremity and left lower extremity and improved coordination.  Patient to discharge at an ambulatory level Supervision.   Patient's care partner is independent to provide the necessary physical assistance at discharge.  Reasons goals not met: All goals met  Recommendation:  Patient will benefit from ongoing skilled PT services in home health setting to continue to advance safe functional mobility, address ongoing impairments in balance, strength, ROM, coordination, activity tolerance, and minimize fall risk.  Equipment: No equipment provided  Reasons for discharge: treatment goals met and discharge from hospital  Patient/family agrees with progress made and goals achieved: Yes  PT Discharge Precautions/RestrictionsPrecautions Precautions: Fall Restrictions Weight Bearing Restrictions: No Pain  Denies pain Vision/Perception    WFL; wears glasses at all times Cognition Overall Cognitive Status: Within Functional Limits for tasks assessed Arousal/Alertness: Awake/alert Orientation Level: Oriented X4 Sustained Attention: Appears intact Selective Attention: Appears intact Awareness: Appears intact Problem Solving: Appears intact Safety/Judgment: Appears intact Sensation Sensation Light Touch: Impaired Detail Light Touch Impaired Details: Impaired RLE;Impaired LLE Coordination Gross Motor Movements are Fluid and Coordinated: Yes Fine Motor Movements are Fluid and Coordinated: Yes Motor  Motor Motor: Within Functional Limits  Mobility Bed Mobility Bed Mobility: Supine to Sit;Sit to  Supine Supine to Sit: 6: Modified independent (Device/Increase time) Sit to Supine: 6: Modified independent (Device/Increase time) Transfers Transfers: Yes Sit to Stand: 6: Modified independent (Device/Increase time) Stand to Sit: 6: Modified independent (Device/Increase time) Stand Pivot Transfers: 6: Modified independent (Device/Increase time) Locomotion  Ambulation Ambulation: Yes Ambulation/Gait Assistance: 5: Supervision Ambulation Distance (Feet): 250 Feet Assistive device: Rolling walker Ambulation/Gait Assistance Details: Verbal cues for precautions/safety;Verbal cues for technique Gait Gait: Yes Gait Pattern: Impaired Gait Pattern: Trunk flexed;Poor foot clearance - left;Poor foot clearance - right;Left flexed knee in stance;Right flexed knee in stance;Decreased trunk rotation;Decreased stride length Stairs / Additional Locomotion Stairs: Yes Stairs Assistance: 5: Supervision Stairs Assistance Details: Verbal cues for precautions/safety;Verbal cues for gait pattern Stair Management Technique: Two rails;Forwards;Alternating pattern Number of Stairs: 12 Height of Stairs: 6 Ramp: 5: Supervision Curb: 5: Supervision Wheelchair Mobility Wheelchair Mobility: No (pt ambulatory)  Trunk/Postural Assessment  Cervical Assessment Cervical Assessment: Within Functional Limits Thoracic Assessment Thoracic Assessment: Exceptions to [facility] (Kyphotic) Lumbar Assessment Lumbar Assessment: Within Functional Limits Postural Control Postural Control: Within Functional Limits  Balance Balance Balance Assessed: Yes Dynamic Sitting Balance Dynamic Sitting - Balance Support: During functional activity Dynamic Sitting - Level of Assistance: 6: Modified independent (Device/Increase time) Static Standing Balance Static Standing - Balance Support: During functional activity Static Standing - Level of Assistance: 6: Modified independent (Device/Increase time) Dynamic Standing Balance Dynamic  Standing - Balance Support: During functional activity Dynamic Standing - Level of Assistance: 5: Stand by assistance Extremity Assessment  RUE Assessment RUE Assessment: Within Functional Limits LUE Assessment LUE Assessment: Within Functional Limits RLE Assessment RLE Assessment: Within Functional Limits (grossly 4+/5 to 5/5 throughout; slow power production during functional tasks) LLE Assessment LLE Assessment: Within Functional Limits (grossly 4+/5 to 5/5 throughout; slow power production during functional tasks)   See Function Navigator for Current Functional Status.  Benjiman Core Tygielski 04/27/2016, 2:35 PM

## 2016-04-27 NOTE — Progress Notes (Signed)
Occupational Therapy Discharge Summary  Patient Details  Name: [name] MRN: [id number] Date of Birth: [birth year]   Patient has met 8 of 8 long term goals due to improved activity tolerance, improved balance, postural control and improved coordination.  Patient to discharge at overall Supervision level.  Patient's care partner is independent to provide the necessary physical assistance at discharge.   Pt's wife has completed family education and have reviewed recommendation for tub transfer bench and grab bars.  Pt and wife feeling comfortable with planned d/c home.  Pt supervision overall, occasional posterior LOB episodes, especially when vision occluded (i.e. Standing at sink washing face, etc.)  Recommendation:  Patient will benefit from ongoing skilled OT services in home health setting to continue to advance functional skills in the area of BADL and iADL.  Equipment: Tub transfer bench and BSC  Reasons for discharge: treatment goals met and discharge from hospital  Patient/family agrees with progress made and goals achieved: Yes  OT Discharge Precautions/Restrictions  Precautions Precautions: Fall Restrictions Weight Bearing Restrictions: No Vision/Perception  Vision- History Baseline Vision/History: Wears glasses Wears Glasses: At all times Patient Visual Report: No change from baseline  Cognition Overall Cognitive Status: Within Functional Limits for tasks assessed Arousal/Alertness: Awake/alert Orientation Level: Oriented X4 Sustained Attention: Appears intact Selective Attention: Appears intact Awareness: Appears intact Problem Solving: Appears intact Safety/Judgment: Appears intact Sensation Sensation Light Touch: Impaired Detail Light Touch Impaired Details: Impaired RLE;Impaired LLE Coordination Gross Motor Movements are Fluid and Coordinated: Yes Fine Motor Movements are Fluid and Coordinated: Yes Motor  Motor Motor: Within Functional  Limits Trunk/Postural Assessment  Cervical Assessment Cervical Assessment: Within Functional Limits Thoracic Assessment Thoracic Assessment: Exceptions to [facility] (Kyphotic) Lumbar Assessment Lumbar Assessment: Within Functional Limits Postural Control Postural Control: Within Functional Limits  Balance Balance Balance Assessed: Yes Dynamic Sitting Balance Dynamic Sitting - Balance Support: During functional activity Dynamic Sitting - Level of Assistance: 6: Modified independent (Device/Increase time) Static Standing Balance Static Standing - Balance Support: During functional activity Static Standing - Level of Assistance: 6: Modified independent (Device/Increase time) Dynamic Standing Balance Dynamic Standing - Balance Support: During functional activity Dynamic Standing - Level of Assistance: 5: Stand by assistance Extremity/Trunk Assessment RUE Assessment RUE Assessment: Within Functional Limits LUE Assessment LUE Assessment: Within Functional Limits   See Function Navigator for Current Functional Status.  Lewis, Jakeline Dave C 04/27/2016, 1:41 PM

## 2016-04-27 NOTE — Progress Notes (Signed)
Social Work Patient ID: [name], male   DOB: 1940/10/06, 75 y.o.   MRN: [id number]   Met with pt and wife this afternoon to review team conference.  Both aware that d/c on target for tomorrow.  Wife aware that pt still having some urinary retention and will need to educate her on I/O caths - she is agreeable and plans to do so with nsg prior to d/c.  Reviewed DME and [facility] set up.  Ready for d/c.  [place] Stenberg, LCSW

## 2016-04-27 NOTE — Progress Notes (Signed)
Physical Therapy Session Note  Patient Details  Name: [name] MRN: [id number] Date of Birth: [birth year]  Today's Date: 04/27/2016 PT Individual Time: 1005-1100 and 1430-1515 PT Individual Time Calculation (min): 55 min and 45 min (total 100 min)    Short Term Goals: Week 2:  PT Short Term Goal 1 (Week 2): =LTG due to estimated LOS, S overall  Skilled Therapeutic Interventions/Progress Updates:   Tx 1: Pt received seated on mat table after previous PT session, denies pain and agreeable to treatment. Dynamic gait in parallel bars including side stepping and braiding. Gait with RW 2x250' with RW and S. Performed car transfer, gait over mulch/ramp/curb with RW and S. Performed dynavision on mode A for reaction speed of 1.68 sec; performed with cognitive dual task reading one-digit numbers from t-scope and reaction speed decreased to 2.11 sec. Also performed mode B with instruction to hit red lights with RUE, green lights with LUE; initially required max repetitive cueing to recall instructions and hit lights accurately however faded to no cueing and accurate hitting pattern. Gait in community environment including navigating through tight spaces requiring min cues for side stepping. Remained seated in recliner at end of session, all needs in reach.   Tx 2: Pt received seated in recliner; wife present for nursing education on catheterization. Discussed with RN, who requests shorter therapy session than planned to allow time for education before wife has to leave. Gait in community environment with RW and S overall; occasional cues for upright posture. Pt requires occasional rest breaks due to fatigue which he self-identifies and initiates rest break. Performed nustep x 11 min with BUE/BLE level 6 for strengthening and endurance. Returned to room with gait S using RW. Discussed home safety with pt and wife, including navigating obstacles, cues for upright posture, and balance progression with  HHPT with goal of working away from [name]. Pt and wife with no further questions regarding d/c at this time. Pt remained seated in recliner with handoff to RN to complete family education; missed 15 min of session.   Therapy Documentation Precautions:  Precautions Precautions: Fall Restrictions Weight Bearing Restrictions: No   See Function Navigator for Current Functional Status.   Therapy/Group: Individual Therapy  Luberta Mutter 04/27/2016, 12:50 PM

## 2016-04-27 NOTE — Progress Notes (Signed)
At 0100 PVR 615, encouraged to void, voided 250, and post pvr 295

## 2016-04-27 NOTE — Progress Notes (Signed)
Physical Therapy Note  Patient Details  Name: [name] MRN: [id number] Date of Birth: November 22, 1940 Today's Date: 04/27/2016    Time: 930-956 26 minutes  1:1 No c/o pain. Gait in controlled environment throughout unit with mod I with RW.  Gait in obstacle course with supervision, cues for foot clearance with stepping over obstacles.  Gait in home environment with cues for safety with negotiating tight spaces and side stepping.   [name] 04/27/2016, 9:58 AM

## 2016-04-28 DIAGNOSIS — R269 Unspecified abnormalities of gait and mobility: Secondary | ICD-10-CM | POA: Diagnosis not present

## 2016-04-28 DIAGNOSIS — I4891 Unspecified atrial fibrillation: Secondary | ICD-10-CM | POA: Diagnosis not present

## 2016-04-28 DIAGNOSIS — G610 Guillain-Barre syndrome: Secondary | ICD-10-CM | POA: Diagnosis not present

## 2016-04-28 LAB — BASIC METABOLIC PANEL
ANION GAP: 7 (ref 5–15)
BUN: 20 mg/dL (ref 6–20)
CALCIUM: 8.8 mg/dL — AB (ref 8.9–10.3)
CHLORIDE: 108 mmol/L (ref 101–111)
CO2: 26 mmol/L (ref 22–32)
CREATININE: 1.49 mg/dL — AB (ref 0.61–1.24)
GFR calc non Af Amer: 44 mL/min — ABNORMAL LOW (ref >60)
GFR, EST AFRICAN AMERICAN: 51 mL/min — AB (ref >60)
Glucose, Bld: 103 mg/dL — ABNORMAL HIGH (ref 65–99)
Potassium: 4.1 mmol/L (ref 3.5–5.1)
SODIUM: 141 mmol/L (ref 135–145)

## 2016-04-28 LAB — CBC
HCT: 39 % (ref 39.0–52.0)
HEMOGLOBIN: 12.2 g/dL — AB (ref 13.0–17.0)
MCH: 27.5 pg (ref 26.0–34.0)
MCHC: 31.3 g/dL (ref 30.0–36.0)
MCV: 88 fL (ref 78.0–100.0)
PLATELETS: 224 10*3/uL (ref 150–400)
RBC: 4.43 MIL/uL (ref 4.22–5.81)
RDW: 14 % (ref 11.5–15.5)
WBC: 10.2 10*3/uL (ref 4.0–10.5)

## 2016-04-28 LAB — PROTIME-INR
INR: 2.52
PROTHROMBIN TIME: 27.6 s — AB (ref 11.4–15.2)

## 2016-04-28 MED ORDER — WARFARIN SODIUM 5 MG PO TABS: 5.0000 mg | ORAL_TABLET | Freq: Every day | ORAL | 0 refills | Status: DC

## 2016-04-28 MED ORDER — LIDOCAINE HCL 2 % EX GEL: 1.0000 "application " | Freq: Once | CUTANEOUS | 0 refills | Status: AC

## 2016-04-28 MED ORDER — BARD UNIVERSAL FOLEY CATH TRAY KIT: 1.0000 | PACK | Freq: Two times a day (BID) | 0 refills | Status: DC

## 2016-04-28 MED ORDER — METOPROLOL TARTRATE 50 MG PO TABS: 25.0000 mg | ORAL_TABLET | Freq: Two times a day (BID) | ORAL | 0 refills | Status: DC

## 2016-04-28 MED ORDER — BETHANECHOL CHLORIDE 25 MG PO TABS: 25.0000 mg | ORAL_TABLET | Freq: Three times a day (TID) | ORAL | 0 refills | Status: DC

## 2016-04-28 MED ORDER — GABAPENTIN 100 MG PO CAPS: 100.0000 mg | ORAL_CAPSULE | Freq: Every day | ORAL | 0 refills | Status: DC

## 2016-04-28 NOTE — Progress Notes (Signed)
1130:  Pt discharged to home accompanied by wife. VSS, denies pain/discomfort. Pt voided prior to D/C and scanned for 56cc. Pt and wife verbalize understanding of D/C instructions given by [facility]. Reviewed I/O caths with pt and wife; thorough education completed yesterday.

## 2016-04-28 NOTE — Progress Notes (Signed)
[facility] PHYSICAL MEDICINE & REHABILITATION     PROGRESS NOTE    Subjective/Complaints: Voiding well again. Some frequency but tolerable  ROS: pt denies nausea, vomiting, diarrhea, cough, shortness of breath or chest pain     Objective: Vital Signs: Blood pressure (!) 131/49, pulse 63, temperature 99.1 F (37.3 C), temperature source Oral, resp. rate 17, height 5\' 11"  (1.803 m), weight 90.6 kg (199 lb 11.8 oz), SpO2 95 %. No results found.  Recent Labs  04/28/16 0650  WBC 10.2  HGB 12.2*  HCT 39.0  PLT 224    Recent Labs  04/26/16 0626 04/28/16 0650  NA 138 141  K 4.2 4.1  CL 103 108  GLUCOSE 109* 103*  BUN 21* 20  CREATININE 1.59* 1.49*  CALCIUM 8.9 8.8*   CBG (last 3)  No results for input(s): GLUCAP in the last 72 hours.  Wt Readings from Last 3 Encounters:  04/28/16 90.6 kg (199 lb 11.8 oz)  04/14/16 99.7 kg (219 lb 14.4 oz)  03/30/16 101.2 kg (223 lb)    Physical Exam:  Constitutional: He is oriented to person, place, and time. He appears well-developed and well-nourished.  HENT:  NCAT.  Mouth/Throat: Oropharynx is clear and moist.  Eyes: PERRL Neck: Normal range of motion. Neck supple.  Cardiovascular: RRR Respiratory: cta bilaterally GI: Soft. Bowel sounds are normal. He exhibits no distension. There is no tenderness.  Musculoskeletal: no edema Neurological: He is alert and oriented to person, place, and time.   Sensory loss from knees to feet to LT/propriocetpion--no change DTRs 1+ bilateral LE and UE    Motor: RUE/RLE: 4/5 proximal to distal LUE/4/5 proximal to distal   LLE 4/5 hf,ke and ADF/PF 3 to 3+/5.  Skin: skin intact GU: no urethral irritation Psychiatric: pleasant and appropriate  Assessment/Plan: 1. Functional and mobility deficits secondary to GBS which require 3+ hours per day of interdisciplinary therapy in a comprehensive inpatient rehab setting. Physiatrist is providing close team supervision and 24 hour management of  active medical problems listed below. Physiatrist and rehab team continue to assess barriers to discharge/monitor patient progress toward functional and medical goals.  Function:  Bathing Bathing position   Position: Shower  Bathing parts Body parts bathed by patient: Left arm, Right arm, Chest, Abdomen, Front perineal area, Buttocks, Right upper leg, Left upper leg, Right lower leg, Left lower leg, Back Body parts bathed by helper: Back  Bathing assist Assist Level: Supervision or verbal cues      Upper Body Dressing/Undressing Upper body dressing   What is the patient wearing?: Pull over shirt/dress     Pull over shirt/dress - Perfomed by patient: Thread/unthread right sleeve, Thread/unthread left sleeve, Put head through opening, Pull shirt over trunk          Upper body assist Assist Level: No help, No cues   Set up : To obtain clothing/put away  Lower Body Dressing/Undressing Lower body dressing   What is the patient wearing?: Maryln Manuel, Underwear, Pants Underwear - Performed by patient: Thread/unthread right underwear leg, Thread/unthread left underwear leg, Pull underwear up/down Underwear - Performed by helper: Pull underwear up/down Pants- Performed by patient: Thread/unthread right pants leg, Thread/unthread left pants leg, Pull pants up/down, Fasten/unfasten pants Pants- Performed by helper: Thread/unthread left pants leg, Pull pants up/down, Thread/unthread right pants leg Non-skid slipper socks- Performed by patient: Don/doff left sock, Don/doff right sock Non-skid slipper socks- Performed by helper: Don/doff left sock     Shoes - Performed by patient:  Don/doff right shoe, Don/doff left shoe, Fasten right, Fasten left         TED Hose - Performed by helper: Don/doff right TED hose, Don/doff left TED hose  Lower body assist Assist for lower body dressing: Supervision or verbal cues   Set up : Don/doff TED stockings  Toileting Toileting Toileting activity did  not occur: Safety/medical concerns Toileting steps completed by patient: Adjust clothing prior to toileting, Performs perineal hygiene, Adjust clothing after toileting Toileting steps completed by helper: Performs perineal hygiene Toileting Assistive Devices: Grab bar or rail  Toileting assist Assist level: Supervision or verbal cues   Transfers Chair/bed transfer   Chair/bed transfer method: Stand pivot Chair/bed transfer assist level: No Help, no cues, assistive device, takes more than a reasonable amount of time Chair/bed transfer assistive device: Armrests, Medical sales representative     Max distance: 200 Assist level: Supervision or verbal cues   Wheelchair   Type: Manual Max wheelchair distance: 175 Assist Level: Supervision or verbal cues  Cognition Comprehension Comprehension assist level: Understands complex 90% of the time/cues 10% of the time  Expression Expression assist level: Expresses complex 90% of the time/cues < 10% of the time  Social Interaction Social Interaction assist level: Interacts appropriately with others - No medications needed.  Problem Solving Problem solving assist level: Solves complex 90% of the time/cues < 10% of the time  Memory Memory assist level: Recognizes or recalls 90% of the time/requires cueing < 10% of the time   Medical Problem List and Plan: 1.  Weakness, gait abnormality, poor activity tolerance secondary to GBS (with history of GBS).  -continue CIR PT,OT  -dc home today  Patient to see me in the office for transitional care encounter in 1-2 weeks.  2.  DVT Prophylaxis/Anticoagulation:  Coumadin per pharm therapeutic, for Afib 3. Pain Management: continue low dose Neurontin. Tylenol as needed 4. Mood: Motivated to work hard and get better. LCSW to follow for evaluation and support.  5. Neuropsych: This patient is capable of making decisions on his own behalf. 6. Skin/Wound Care: routine pressure relief measures.  7.  Fluids/Electrolytes/Nutrition: Monitor I/O. Encouraging PO 8. HTN: Monitor BID.  metoprolol and cardizem Has orthostatic hypotension, but asymptomatic,     9. A fib: in NSR- HR controlled but on low side reduced metoprolol , off cardizem 10. Recurrent meningiomas: Being monitored.  11. CKD:  Baseline Cr- 1.4-1.5. ----at 1.59 current  - dc'ed lasix 20mg  daily   - edema is decr. Weight 87kg  -elevation/compression  -had normal EF in october 12. Urinary retention: e   -continue 25mg  urecholine as outpt  -will wean after I see him  -wife/pt educated on I/O caths  -continue current flomax home med  -recent ucx negative    13. Leucocytosis:   -10.2   .  LOS (Days) 14 A FACE TO FACE EVALUATION WAS PERFORMED  [name] 04/28/2016 8:41 AM

## 2016-04-28 NOTE — Discharge Summary (Signed)
Physician Discharge Summary  Patient ID: [name] MRN: [id number] DOB/AGE: Oct 01, 1940 75 y.o.  Admit date: 04/14/2016 Discharge date: 04/28/2016  Discharge Diagnoses:  Principal Problem:   GBS (Guillain Barre syndrome) ([place]) Active Problems:   Atrial fibrillation (HCC)   Benign essential HTN   Stage 3 chronic kidney disease   Urinary retention   Discharged Condition:  stable  Significant Diagnostic Studies: N/A  Labs:  Basic Metabolic Panel:  Recent Labs Lab 04/23/16 0440 04/26/16 0626 04/28/16 0650  NA 138 138 141  K 4.0 4.2 4.1  CL 104 103 108  CO2 _0 GLUCOSE 108* 109* 103*  BUN 26* 21* 20  CREATININE 1.58* 1.59* 1.49*  CALCIUM 8.9 8.9 8.8*    CBC: CBC Latest Ref Rng & Units 04/28/2016 04/21/2016 04/19/2016  WBC 4.0 - 10.5 K/uL 10.2 9.9 10.2  Hemoglobin 13.0 - 17.0 g/dL 12.2(L) 12.2(L) 12.1(L)  Hematocrit 39.0 - 52.0 % 39.0 38.8(L) 38.0(L)  Platelets 150 - 400 K/uL 224 206 208    Lab Results  Component Value Date   INR 2.52 04/28/2016   INR 2.19 04/26/2016   INR 2.14 04/24/2016   CBG: No results for input(s): GLUCAP in the last 168 hours.  Brief HPI:   Ross Weaver Haynesis a 75 y.o.malewith history of HTN, CKD, recent diagnosis of PAF, CVA, crani for meningioma resection, recent admissions for urosepsis with urinary retention and left knee cellulitis, h/o GBS who was admitted on 04/04/16 with 2-3 day history of progressive BLE numbness with tingling and falls. MRI spine without evidence of significant stenosis.  Neurology evaluated patient and exam consistent with significant BLE weakness with areflexia and BUE with hyporeflexia. He was started on IVIG on 11/19 without significant improvement. LP 11/22 with elevated levels of glucose and protein with cytoalbuminologic dissociation concerning for GBS and he was started on another round of IVIG with improvement. Therapy ongoing revealing diffuse weakness and  CIR was recommended for follow up  therapy.    Hospital Course: [name] was admitted to rehab 04/14/2016 for inpatient therapies to consist of PT and OT at least three hours five days a week. Past admission physiatrist, therapy team and rehab RN have worked together to provide customized collaborative inpatient rehab. Blood pressures were monitored on bid basis and he was noted to have asymptomatic orthostatic changes. Heart rate has been controlled on lower dose metoprolol and coumadin is therapeutic on 5 mg dose daily. Foley was discontinued and he continued to have issues with retention. Urine culture was negative for infection and he was started on low dose urecholine without side effects. This was increased to 25 mg tid with improvement in voiding function. Patient and wife have been educated performing I/O caths, monitoring urine output and catheterizing if no void in 8 hours or prn bladder discomfort.   Neuropathy has been managed with low dose Neurontin.   BLE edema has resolved and lasix was discontinued on 12/12.  Acute on chronic renal failure has resolved and Cr is back to baseline. He is continent of bowel and bladder.  He has made good progress during his rehab stay and is ambulating at supervision level. He will continue to receive follow up HHPT, [facility] and [place] [facility] [place] after discharge.  Rehab course: During patient's stay in rehab weekly team conferences were held to monitor patient's progress, set goals and discuss barriers to discharge.  At admission, patient required max assist with mobility and moderate assist with basic ADL  tasks.  He  has had improvement in activity tolerance, balance, postural control, as well as ability to compensate for deficits. He is able to complete ADL tasks with supervision. He is modified independent for transfers and is ambulating 250' with RW and supervision. Family education was completed with wife who will assist as needed after discharge.     Disposition:  Home  Diet: Regular  Special Instructions: 1. Monitor how much fluid you are taking in. Monitor amount of urine output. Cath if you do not urinate in 6-8 hours OR if you have bladder discomfort. 2. HHRN to draw protime on 12/15 with results to [name]     Discharge Instructions    Ambulatory referral to Neurology    Complete by:  As directed    An appointment is requested in approximately: 2 weeks/Follow up on GBS. Had an episode about 19 years ago.   Ambulatory referral to Physical Medicine Rehab    Complete by:  As directed    1-2 week transitional care        Medication List    TAKE these medications   acetaminophen 325 MG tablet Commonly known as:  TYLENOL Take 1-2 tablets (325-650 mg total) by mouth every 4 (four) hours as needed for mild pain. What changed:  medication strength  how much to take  when to take this  reasons to take this   BARD UNIVERSAL FOLEY CATH TRAY Kit 1 kit by Does not apply route 2 (two) times daily.   bethanechol 25 MG tablet Commonly known as:  URECHOLINE Take 1 tablet (25 mg total) by mouth 3 (three) times daily.   cholecalciferol 1000 units tablet Commonly known as:  VITAMIN D Take 1,000 Units by mouth daily.   diltiazem 120 MG 24 hr capsule Commonly known as:  CARDIZEM CD Take 1 capsule (120 mg total) by mouth daily.   furosemide 80 MG tablet Commonly known as:  LASIX Take 40-80 mg by mouth daily as needed for fluid or edema (weight gain of 2 lbs in 24 hours or 5 lbs in a week).   gabapentin 100 MG capsule Commonly known as:  NEURONTIN Take 1 capsule (100 mg total) by mouth at bedtime.   lidocaine 2 % jelly Commonly known as:  XYLOCAINE Place 1 application into the urethra once.   metoprolol 50 MG tablet Commonly known as:  LOPRESSOR Take 0.5 tablets (25 mg total) by mouth 2 (two) times daily. What changed:  how much to take   rosuvastatin 40 MG tablet Commonly known as:  CRESTOR Take  40 mg by mouth daily.   tamsulosin 0.4 MG Caps capsule Commonly known as:  FLOMAX Take 1 capsule (0.4 mg total) by mouth daily after supper.   vitamin B-12 1000 MCG tablet Commonly known as:  CYANOCOBALAMIN Take 1,000 mcg by mouth 2 (two) times a week. Wednesday and Saturday   warfarin 5 MG tablet Commonly known as:  COUMADIN Take 1 tablet (5 mg total) by mouth daily at 6 PM.      Follow-up Information    Meredith Staggers, MD Follow up.   Specialty:  Physical Medicine and Rehabilitation Why:  office will call you with follow up appointment Contact information: [place] [address] [facility] Call in 1 day(s).   Why:  follow up on retention Contact information: [place]  [place] [address] [phone]  [facility]. Call.   Why:  for follow up on GBS Contact information: [place], [place], MD Follow up.   Specialty:  Internal Medicine Contact information: [address] [place] [address] [phone]           Signed: Bary Leriche 04/28/2016, 9:58 AM

## 2016-04-28 NOTE — Progress Notes (Signed)
ANTICOAGULATION CONSULT NOTE - Follow Up Consult  Pharmacy Consult for coumadin Indication: afib and stroke  Allergies  Allergen Reactions  . Immune Globulins Other (See Comments)    Tetanus Immunes Globulins    (  Pt. Cannot take the Flu shot ) Guillian Barre   . Influenza Vaccines Other (See Comments)    Guillian Barre  . Asa [Aspirin] Other (See Comments)    Severe skin bruising     Patient Measurements: Height: 5\' 11"  (180.3 cm) Weight: 199 lb 11.8 oz (90.6 kg) IBW/kg (Calculated) : 75.3 Heparin Dosing Weight:   Vital Signs: Temp: 99.1 F (37.3 C) (12/13 0529) Temp Source: Oral (12/13 0529) BP: 131/49 (12/12 2103) Pulse Rate: 63 (12/12 2103)  Labs:  Recent Labs  04/26/16 0626 04/28/16 0650  HGB  --  12.2*  HCT  --  39.0  PLT  --  224  LABPROT 24.7* 27.6*  INR 2.19 2.52  CREATININE 1.59* 1.49*    Estimated Creatinine Clearance: 49.3 mL/min (by C-G formula based on SCr of 1.49 mg/dL (H)).   Medications:  Scheduled:  . bethanechol  25 mg Oral TID  . cholecalciferol  1,000 Units Oral Daily  . diltiazem  120 mg Oral Daily  . feeding supplement (PRO-STAT SUGAR FREE 64)  30 mL Oral BID  . gabapentin  100 mg Oral QHS  . lidocaine  1 application Urethral Once  . metoprolol tartrate  25 mg Oral BID  . rosuvastatin  40 mg Oral q1800  . tamsulosin  0.4 mg Oral QPC supper  . vitamin B-12  1,000 mcg Oral Once per day on Wed Sat  . warfarin  5 mg Oral q1800  . Warfarin - Pharmacist Dosing Inpatient   Does not apply q1800   Infusions:    Assessment: 75 yo male with hx of afib and stroke is currently on therapeutic coumadin.  INR today is 2.52.  Goal of Therapy:  INR 2-3 Monitor platelets by anticoagulation protocol: Yes   Plan:  - continue coumadin 5 mg po daily - INR MWF   Phelan Schadt, Tsz-Yin 04/28/2016,8:17 AM

## 2016-04-28 NOTE — Consult Note (Signed)
   [facility] CM Inpatient Consult   04/28/2016  [name] [birth year] [id number]   Chart reviewed. Noted discharge. Will update [facility] NP of confirmed discharge home today.    Marthenia Rolling, MSN-Ed, RN,BSN [facility] Liaison [phone]

## 2016-04-28 NOTE — Discharge Instructions (Signed)
Inpatient Rehab Discharge Instructions  [name] Discharge date and time:  04/28/16  Activities/Precautions/ Functional Status: Activity: no lifting, driving, or strenuous exercise for till cleared by MD. Diet: low fat, low cholesterol diet Wound Care: none needed   Functional status:  ___ No restrictions     ___ Walk up steps independently ___ 24/7 supervision/assistance   ___ Walk up steps with assistance _X__ Intermittent supervision/assistance  ___ Bathe/dress independently ___ Walk with walker     _X__ Bathe/dress with supervision ___ Walk Independently    ___ Shower independently ___ Walk with assistance    ___ Shower with assistance _X__ No alcohol     ___ Return to work/school ________    COMMUNITY REFERRALS UPON DISCHARGE:    Home Health:   PT     OT     RN                      Agency:  [facility] Phone: [phone]   Medical Equipment/Items Ordered: bedside commode and tub bench                                                     Agency/Supplier:  [facility] @ [phone]   Special Instructions: 1. Monitor how much fluid you are taking in. Monitor amount of urine output. Cath if you do not urinate in 6-8 hours OR if you have bladder discomfort.     My questions have been answered and I understand these instructions. I will adhere to these goals and the provided educational materials after my discharge from the hospital.  Patient/Caregiver Signature _______________________________ Date __________  Clinician Signature _______________________________________ Date __________  Please bring this form and your medication list with you to all your follow-up doctor's appointments.

## 2016-04-28 NOTE — Progress Notes (Signed)
Re[facility]ational Therapy Discharge Summary Patient Details  Name: [name] MRN: [id number] Date of Birth: 04/09/1941 Today's Date: 04/28/2016  Long term goals set: 2  Long term goals met: 2  Comments on progress toward goals: Pt has made great progress toward LTGs and is ready for discharge home today at overall supervision ambulatory level.  Pt requires set up assist or min cues for problem solving during obstacle negotiation.  Reasons for discharge: discharge from hospital  Patient/family agrees with progress made and goals achieved: Yes  Avyn Coate 04/28/2016, 4:55 PM

## 2016-04-29 ENCOUNTER — Other Ambulatory Visit: Payer: Self-pay | Admitting: *Deleted

## 2016-04-29 DIAGNOSIS — E785 Hyperlipidemia, unspecified: Secondary | ICD-10-CM | POA: Diagnosis not present

## 2016-04-29 DIAGNOSIS — N183 Chronic kidney disease, stage 3 (moderate): Secondary | ICD-10-CM | POA: Diagnosis not present

## 2016-04-29 DIAGNOSIS — N289 Disorder of kidney and ureter, unspecified: Secondary | ICD-10-CM | POA: Diagnosis not present

## 2016-04-29 DIAGNOSIS — I129 Hypertensive chronic kidney disease with stage 1 through stage 4 chronic kidney disease, or unspecified chronic kidney disease: Secondary | ICD-10-CM | POA: Diagnosis not present

## 2016-04-29 DIAGNOSIS — R339 Retention of urine, unspecified: Secondary | ICD-10-CM | POA: Diagnosis not present

## 2016-04-29 DIAGNOSIS — Z466 Encounter for fitting and adjustment of urinary device: Secondary | ICD-10-CM | POA: Diagnosis not present

## 2016-04-29 DIAGNOSIS — L03116 Cellulitis of left lower limb: Secondary | ICD-10-CM | POA: Diagnosis not present

## 2016-04-29 DIAGNOSIS — Z7901 Long term (current) use of anticoagulants: Secondary | ICD-10-CM | POA: Diagnosis not present

## 2016-04-29 DIAGNOSIS — I4891 Unspecified atrial fibrillation: Secondary | ICD-10-CM | POA: Diagnosis not present

## 2016-04-29 NOTE — Patient Outreach (Signed)
Transition of care call. Mr. Eveland returned home yesterday after a hospitalization and then subsequent inpt. Rehab for reoccurance of Guillian Barre sydrome. He reports he is doing well. He is now able to walk with a walker. He has already had a nursing visit and the therapist is due to arrive shortly. I reviewed my role with [facility] and to make weekly transition of care calls to assure he is improving without any complications. I made sure they have my number and also the 24 hour nurse line number. I will call him again next Thursday.  [facility] CM Care Plan Problem One   Flowsheet Row Most Recent Value  Care Plan Problem One  Reoccurance of Guillain Barre Syndrome  Role Documenting the Problem One  Care Management Coordinator  Care Plan for Problem One  Active  [facility] Long Term Goal (31-90 days)  -- [Pt will hopefully not readmit to the hospital for any reason]  Interventions for Problem One Long Term Goal  Advised of program goal to prevent complications and going back to the hospital .  [facility] CM Short Term Goal #1 (0-30 days)  Pt will participate in weekly transition of care calls.  [facility] CM Short Term Goal #1 Start Date  04/29/16  Interventions for Short Term Goal #1  Advised pt of transtion of care program and expectation to call weekly or for pt to call me if any problems in between.    [facility] CM Care Plan Problem Two   Flowsheet Row Most Recent Value  Care Plan Problem Two  High Risk for Falls  Role Documenting the Problem Two  Care Management Coordinator  Care Plan for Problem Two  Active  [facility] CM Short Term Goal #1 (0-30 days)  Pt will participate with PT over the next 30 days.  [facility] CM Short Term Goal #1 Start Date  04/29/16  Interventions for Short Term Goal #2   Encouraged 100% participation with PT.  [facility] CM Short Term Goal #2 (0-30 days)  Pt will not sustain any serious injury from falling over the next 30 days.  [facility] CM Short Term Goal #2 Start Date  04/29/16  Interventions for Short Term Goal #2   Advised to use fall precautions. Use walker and move slowly.     Deloria Lair [facility] [place] [phone]

## 2016-04-29 NOTE — Progress Notes (Signed)
Social Work  Discharge Note  The overall goal for the admission was met for:   Discharge location: Yes - home with wife able to provide 24/7 assistance  Length of Stay: Yes - 14 days  Discharge activity level: Yes - supervision overall  Home/community participation: Yes  Services provided included: MD, RD, PT, OT, SLP, RN, TR, Pharmacy and SW  Financial Services: Private Insurance: [employer]  Follow-up services arranged: Home Health: RN, PT, OT via [facility], DME: bedside commode, tub bench - [facility] and Patient/Family has no preference for HH/DME agencies  Comments (or additional information):  Patient/Family verbalized understanding of follow-up arrangements: Yes  Individual responsible for coordination of the follow-up plan: pt  Confirmed correct DME delivered: ,  04/29/2016    ,  

## 2016-05-04 DIAGNOSIS — E785 Hyperlipidemia, unspecified: Secondary | ICD-10-CM | POA: Diagnosis not present

## 2016-05-04 DIAGNOSIS — N289 Disorder of kidney and ureter, unspecified: Secondary | ICD-10-CM | POA: Diagnosis not present

## 2016-05-04 DIAGNOSIS — Z466 Encounter for fitting and adjustment of urinary device: Secondary | ICD-10-CM | POA: Diagnosis not present

## 2016-05-04 DIAGNOSIS — L03116 Cellulitis of left lower limb: Secondary | ICD-10-CM | POA: Diagnosis not present

## 2016-05-04 DIAGNOSIS — N183 Chronic kidney disease, stage 3 (moderate): Secondary | ICD-10-CM | POA: Diagnosis not present

## 2016-05-04 DIAGNOSIS — Z7901 Long term (current) use of anticoagulants: Secondary | ICD-10-CM | POA: Diagnosis not present

## 2016-05-04 DIAGNOSIS — I4891 Unspecified atrial fibrillation: Secondary | ICD-10-CM | POA: Diagnosis not present

## 2016-05-04 DIAGNOSIS — I129 Hypertensive chronic kidney disease with stage 1 through stage 4 chronic kidney disease, or unspecified chronic kidney disease: Secondary | ICD-10-CM | POA: Diagnosis not present

## 2016-05-04 DIAGNOSIS — R339 Retention of urine, unspecified: Secondary | ICD-10-CM | POA: Diagnosis not present

## 2016-05-06 ENCOUNTER — Telehealth: Payer: Self-pay | Admitting: *Deleted

## 2016-05-06 DIAGNOSIS — I4891 Unspecified atrial fibrillation: Secondary | ICD-10-CM | POA: Diagnosis not present

## 2016-05-06 DIAGNOSIS — I129 Hypertensive chronic kidney disease with stage 1 through stage 4 chronic kidney disease, or unspecified chronic kidney disease: Secondary | ICD-10-CM | POA: Diagnosis not present

## 2016-05-06 DIAGNOSIS — E785 Hyperlipidemia, unspecified: Secondary | ICD-10-CM | POA: Diagnosis not present

## 2016-05-06 DIAGNOSIS — Z466 Encounter for fitting and adjustment of urinary device: Secondary | ICD-10-CM | POA: Diagnosis not present

## 2016-05-06 DIAGNOSIS — R339 Retention of urine, unspecified: Secondary | ICD-10-CM | POA: Diagnosis not present

## 2016-05-06 DIAGNOSIS — L03116 Cellulitis of left lower limb: Secondary | ICD-10-CM | POA: Diagnosis not present

## 2016-05-06 DIAGNOSIS — N289 Disorder of kidney and ureter, unspecified: Secondary | ICD-10-CM | POA: Diagnosis not present

## 2016-05-06 DIAGNOSIS — Z7901 Long term (current) use of anticoagulants: Secondary | ICD-10-CM | POA: Diagnosis not present

## 2016-05-06 DIAGNOSIS — N183 Chronic kidney disease, stage 3 (moderate): Secondary | ICD-10-CM | POA: Diagnosis not present

## 2016-05-06 NOTE — Telephone Encounter (Signed)
Spoke with [name] with [facility] and she states was in pt's discharge orders to call coumadin clinic with INR results . Pt has been followed by Dr Laurann Montana regarding his coumadin and Dr Curt Bears wanted coumadin clinic to follow but at this time has not been seen by Dr Curt Bears and he does have an appt to be seen in February and when he is set up by our [facility] then we will be glad to follow his INR and dose his coumadin Angie instructed to call Dr Laurann Montana the INR results and when he is established with Dr Curt Bears we will follow him and she states understanding

## 2016-05-07 ENCOUNTER — Other Ambulatory Visit: Payer: Self-pay | Admitting: *Deleted

## 2016-05-07 DIAGNOSIS — I4891 Unspecified atrial fibrillation: Secondary | ICD-10-CM | POA: Diagnosis not present

## 2016-05-07 DIAGNOSIS — R339 Retention of urine, unspecified: Secondary | ICD-10-CM | POA: Diagnosis not present

## 2016-05-07 DIAGNOSIS — Z466 Encounter for fitting and adjustment of urinary device: Secondary | ICD-10-CM | POA: Diagnosis not present

## 2016-05-07 DIAGNOSIS — L03116 Cellulitis of left lower limb: Secondary | ICD-10-CM | POA: Diagnosis not present

## 2016-05-07 DIAGNOSIS — N183 Chronic kidney disease, stage 3 (moderate): Secondary | ICD-10-CM | POA: Diagnosis not present

## 2016-05-07 DIAGNOSIS — E785 Hyperlipidemia, unspecified: Secondary | ICD-10-CM | POA: Diagnosis not present

## 2016-05-07 DIAGNOSIS — Z7901 Long term (current) use of anticoagulants: Secondary | ICD-10-CM | POA: Diagnosis not present

## 2016-05-07 DIAGNOSIS — I129 Hypertensive chronic kidney disease with stage 1 through stage 4 chronic kidney disease, or unspecified chronic kidney disease: Secondary | ICD-10-CM | POA: Diagnosis not present

## 2016-05-07 DIAGNOSIS — N289 Disorder of kidney and ureter, unspecified: Secondary | ICD-10-CM | POA: Diagnosis not present

## 2016-05-07 NOTE — Patient Outreach (Signed)
Transition of care call. Pt reports he is making good progress. He is now able to ambulate throughout his home with a walker. He denies any other issues that need to be addressed today. He and his wife will stay home for the holiday. He knows he can call me and I will check in again with him next week.  Deloria Lair [facility] [place] [phone]

## 2016-05-11 DIAGNOSIS — I129 Hypertensive chronic kidney disease with stage 1 through stage 4 chronic kidney disease, or unspecified chronic kidney disease: Secondary | ICD-10-CM | POA: Diagnosis not present

## 2016-05-11 DIAGNOSIS — N183 Chronic kidney disease, stage 3 (moderate): Secondary | ICD-10-CM | POA: Diagnosis not present

## 2016-05-11 DIAGNOSIS — N289 Disorder of kidney and ureter, unspecified: Secondary | ICD-10-CM | POA: Diagnosis not present

## 2016-05-11 DIAGNOSIS — R339 Retention of urine, unspecified: Secondary | ICD-10-CM | POA: Diagnosis not present

## 2016-05-11 DIAGNOSIS — Z466 Encounter for fitting and adjustment of urinary device: Secondary | ICD-10-CM | POA: Diagnosis not present

## 2016-05-11 DIAGNOSIS — L03116 Cellulitis of left lower limb: Secondary | ICD-10-CM | POA: Diagnosis not present

## 2016-05-11 DIAGNOSIS — I4891 Unspecified atrial fibrillation: Secondary | ICD-10-CM | POA: Diagnosis not present

## 2016-05-11 DIAGNOSIS — Z7901 Long term (current) use of anticoagulants: Secondary | ICD-10-CM | POA: Diagnosis not present

## 2016-05-11 DIAGNOSIS — E785 Hyperlipidemia, unspecified: Secondary | ICD-10-CM | POA: Diagnosis not present

## 2016-05-13 ENCOUNTER — Encounter: Payer: Self-pay | Admitting: Physical Medicine & Rehabilitation

## 2016-05-13 ENCOUNTER — Other Ambulatory Visit: Payer: Self-pay | Admitting: *Deleted

## 2016-05-13 ENCOUNTER — Encounter: Payer: PPO | Attending: Physical Medicine & Rehabilitation | Admitting: Physical Medicine & Rehabilitation

## 2016-05-13 VITALS — BP 134/79 | HR 75

## 2016-05-13 DIAGNOSIS — Z9049 Acquired absence of other specified parts of digestive tract: Secondary | ICD-10-CM | POA: Diagnosis not present

## 2016-05-13 DIAGNOSIS — I951 Orthostatic hypotension: Secondary | ICD-10-CM | POA: Diagnosis not present

## 2016-05-13 DIAGNOSIS — I129 Hypertensive chronic kidney disease with stage 1 through stage 4 chronic kidney disease, or unspecified chronic kidney disease: Secondary | ICD-10-CM | POA: Diagnosis not present

## 2016-05-13 DIAGNOSIS — R531 Weakness: Secondary | ICD-10-CM | POA: Insufficient documentation

## 2016-05-13 DIAGNOSIS — Z833 Family history of diabetes mellitus: Secondary | ICD-10-CM | POA: Diagnosis not present

## 2016-05-13 DIAGNOSIS — E785 Hyperlipidemia, unspecified: Secondary | ICD-10-CM | POA: Diagnosis not present

## 2016-05-13 DIAGNOSIS — L03116 Cellulitis of left lower limb: Secondary | ICD-10-CM | POA: Insufficient documentation

## 2016-05-13 DIAGNOSIS — R339 Retention of urine, unspecified: Secondary | ICD-10-CM | POA: Diagnosis not present

## 2016-05-13 DIAGNOSIS — R269 Unspecified abnormalities of gait and mobility: Secondary | ICD-10-CM | POA: Insufficient documentation

## 2016-05-13 DIAGNOSIS — Z9889 Other specified postprocedural states: Secondary | ICD-10-CM | POA: Diagnosis not present

## 2016-05-13 DIAGNOSIS — Z823 Family history of stroke: Secondary | ICD-10-CM | POA: Diagnosis not present

## 2016-05-13 DIAGNOSIS — I10 Essential (primary) hypertension: Secondary | ICD-10-CM | POA: Diagnosis not present

## 2016-05-13 DIAGNOSIS — I480 Paroxysmal atrial fibrillation: Secondary | ICD-10-CM | POA: Insufficient documentation

## 2016-05-13 DIAGNOSIS — Z8673 Personal history of transient ischemic attack (TIA), and cerebral infarction without residual deficits: Secondary | ICD-10-CM | POA: Insufficient documentation

## 2016-05-13 DIAGNOSIS — Z466 Encounter for fitting and adjustment of urinary device: Secondary | ICD-10-CM | POA: Diagnosis not present

## 2016-05-13 DIAGNOSIS — Z87891 Personal history of nicotine dependence: Secondary | ICD-10-CM | POA: Insufficient documentation

## 2016-05-13 DIAGNOSIS — G610 Guillain-Barre syndrome: Secondary | ICD-10-CM | POA: Diagnosis not present

## 2016-05-13 DIAGNOSIS — D329 Benign neoplasm of meninges, unspecified: Secondary | ICD-10-CM | POA: Diagnosis not present

## 2016-05-13 DIAGNOSIS — N289 Disorder of kidney and ureter, unspecified: Secondary | ICD-10-CM | POA: Diagnosis not present

## 2016-05-13 DIAGNOSIS — I4891 Unspecified atrial fibrillation: Secondary | ICD-10-CM | POA: Diagnosis not present

## 2016-05-13 DIAGNOSIS — M792 Neuralgia and neuritis, unspecified: Secondary | ICD-10-CM | POA: Diagnosis not present

## 2016-05-13 DIAGNOSIS — Z800 Family history of malignant neoplasm of digestive organs: Secondary | ICD-10-CM | POA: Insufficient documentation

## 2016-05-13 DIAGNOSIS — G650 Sequelae of Guillain-Barre syndrome: Secondary | ICD-10-CM | POA: Insufficient documentation

## 2016-05-13 DIAGNOSIS — N183 Chronic kidney disease, stage 3 (moderate): Secondary | ICD-10-CM | POA: Insufficient documentation

## 2016-05-13 DIAGNOSIS — Z7901 Long term (current) use of anticoagulants: Secondary | ICD-10-CM | POA: Diagnosis not present

## 2016-05-13 NOTE — Progress Notes (Addendum)
Subjective:    Patient ID: [name], male    DOB: 02/20/1941, 75 y.o.   MRN: [id number]  HPI 75 y.o. male with history of HTN, CKD, recent diagnosis of PAF, CVA, crani for meningioma resection, recent admissions for urosepsis with urinary retention and left knee cellulitis, h/o GBS who presents for hospital follow up for GBS.    Admit date: 04/14/2016 Discharge date: 04/28/2016  Pt presents with wife, who provides most of the information.  Since discharge, pt's BP meds have been stopped by PCP due to labile values and orthostasis.  His edema has improved. His urinary retention has resolved. He has not seen Urology again.  He does not have an appointment to see Neurology either. He sees PCP next week. Overall, doing well.   Therapies: ?Note from [facility] suggesting outpatient Mobility: Cane at all times DME: Bedside commode  Pain Inventory Average Pain 0 Pain Right Now 0 My pain is na  In the last 24 hours, has pain interfered with the following? General activity 0 Relation with others 0 Enjoyment of life 0 What TIME of day is your pain at its worst? na Sleep (in general) Good  Pain is worse with: na Pain improves with: na Relief from Meds: na  Mobility use a cane use a walker ability to climb steps?  yes do you drive?  no Do you have any goals in this area?  yes  Function retired I need assistance with the following:  dressing and bathing  Neuro/Psych weakness trouble walking  Prior Studies Any changes since last visit?  no  Physicians involved in your care Any changes since last visit?  no   Family History  Problem Relation Age of Onset  . Colon cancer Mother     Metastatic   . Cancer Mother     Colon  . Stroke Father   . Diabetes Father    Social History   Social History  . Marital status: Married    Spouse name: N/A  . Number of children: N/A  . Years of education: N/A   Social History Main Topics  . Smoking status: Former Smoker   Types: Cigarettes    Quit date: 05/17/1996  . Smokeless tobacco: Never Used  . Alcohol use 0.6 oz/week    1 Cans of beer per week     Comment: every few months  . Drug use: No  . Sexual activity: Not Asked   Other Topics Concern  . None   Social History Narrative  . None   Past Surgical History:  Procedure Laterality Date  . BRAIN SURGERY  1998   Benign brain tumor removed, Left hemisphere- ? Meningioma  . CAROTID ENDARTERECTOMY Left September 13, 2001   LEFT cea  . CHOLECYSTECTOMY  Nov. 2001  . COLON SURGERY  Feb. 2003   Colonic polyps removed endoscopically  . COLONOSCOPY WITH PROPOFOL N/A 11/11/2015   Procedure: COLONOSCOPY WITH PROPOFOL;  Surgeon: Garlan Fair, MD;  Location: [facility];  Service: Endoscopy;  Laterality: N/A;  . INGUINAL HERNIA REPAIR  1970's   Past Medical History:  Diagnosis Date  . Atrial fibrillation ([place]) 02/2016   NEW ONSET  . Brain tumor (benign) ([place]) 1998   2 small tumors- since 2015 (Dr. [name] following)  . Carotid artery occlusion   . Cerebrovascular disease September 09, 2001   TIA, Left brain  . CKD (chronic kidney disease), stage III   . Diverticulosis   . ED (erectile dysfunction)   .  Guillain-Barre syndrome ([place]) 1999  . Hx of elevated lipids   . Hyperlipidemia   . Hypertension   . Memory loss   . Stroke ([place]) 2003   BP 134/79 (BP Location: Left Arm, Patient Position: Sitting, Cuff Size: Large)   Pulse 75   SpO2 98%   Opioid Risk Score:   Fall Risk Score:  `1  Depression screen PHQ 2/9  Depression screen PHQ 2/9 05/13/2016  Decreased Interest 0  Down, Depressed, Hopeless 0  PHQ - 2 Score 0  Altered sleeping 0  Tired, decreased energy 0  Change in appetite 0  Feeling bad or failure about yourself  0  Trouble concentrating 0  Moving slowly or fidgety/restless 0  Suicidal thoughts 0  PHQ-9 Score 0  Difficult doing work/chores Not difficult at all   Review of Systems  HENT: Negative.   Eyes: Negative.   Respiratory:  Negative.   Cardiovascular: Negative.   Gastrointestinal: Negative.   Endocrine: Negative.   Genitourinary: Negative.   Musculoskeletal: Positive for gait problem.  Skin: Negative.   Allergic/Immunologic: Negative.   Neurological: Positive for weakness.  Hematological: Negative.   Psychiatric/Behavioral: Negative.   All other systems reviewed and are negative.     Objective:   Physical Exam Constitutional: Well-developed and well-nourished.  NAD.  HENT: Normocephalic, atraumatic.  Eyes: EOMI. No discharge.  Cardiovascular: RRR. No JVD. Respiratory: cta bilaterally. Unlabored. GI: Soft. Bowel sounds are normal.   Musculoskeletal: no edema, no tenderness. Neurological: He is alert and oriented.   Sensory intact to light touch Motor: Motor B/l UE 5/5 proximal to distal RLE: 5/5 proximal to distal LLE: HF, KE 4+/5 ADF/PF 5/5 Skin: skin intact. Warm and dry.  Psychiatric: pleasant and appropriate. Normal mood.    Assessment & Plan:  75 y.o. male with history of HTN, CKD, recent diagnosis of PAF, CVA, crani for meningioma resection, recent admissions for urosepsis with urinary retention and left knee cellulitis, h/o GBS who presents for hospital follow up for GBS.    1. Sequela GBS, with history of GBS  Cont therapies, ?outpt now  Pt needs to make follow up appointment with Neurology  2. Pain Management  Will trial d/c Neurontin  3.  HTN with Orthostasis  Meds being adjusted by PCP  Cont to follow with PCP.  Cont TEDS  4. Recurrent meningiomas  Cont to follow Neurosurgery  5. Urinary retention:  Will wean urecholine over few days, educated wife             Continue flomax   Follow up with Urology  6. Neurologic gait disorder  Cont therapies  Cont cane for safety  Meds reviewed Referrals reviewed, needs to make follow up appointment with Neurology and Urology All questions answered

## 2016-05-13 NOTE — Patient Instructions (Signed)
Please schedule follow up appointment with Neurology and Urology

## 2016-05-13 NOTE — Patient Outreach (Signed)
Transition of care call attempted, pt was not able to answer the phone and I left a message requesting a return call.  Deloria Lair [facility] [place] [phone]

## 2016-05-15 DIAGNOSIS — N289 Disorder of kidney and ureter, unspecified: Secondary | ICD-10-CM | POA: Diagnosis not present

## 2016-05-15 DIAGNOSIS — E785 Hyperlipidemia, unspecified: Secondary | ICD-10-CM | POA: Diagnosis not present

## 2016-05-15 DIAGNOSIS — Z466 Encounter for fitting and adjustment of urinary device: Secondary | ICD-10-CM | POA: Diagnosis not present

## 2016-05-15 DIAGNOSIS — R339 Retention of urine, unspecified: Secondary | ICD-10-CM | POA: Diagnosis not present

## 2016-05-15 DIAGNOSIS — I4891 Unspecified atrial fibrillation: Secondary | ICD-10-CM | POA: Diagnosis not present

## 2016-05-15 DIAGNOSIS — L03116 Cellulitis of left lower limb: Secondary | ICD-10-CM | POA: Diagnosis not present

## 2016-05-15 DIAGNOSIS — I129 Hypertensive chronic kidney disease with stage 1 through stage 4 chronic kidney disease, or unspecified chronic kidney disease: Secondary | ICD-10-CM | POA: Diagnosis not present

## 2016-05-15 DIAGNOSIS — N183 Chronic kidney disease, stage 3 (moderate): Secondary | ICD-10-CM | POA: Diagnosis not present

## 2016-05-15 DIAGNOSIS — Z7901 Long term (current) use of anticoagulants: Secondary | ICD-10-CM | POA: Diagnosis not present

## 2016-05-19 DIAGNOSIS — I480 Paroxysmal atrial fibrillation: Secondary | ICD-10-CM | POA: Diagnosis not present

## 2016-05-19 DIAGNOSIS — Z7901 Long term (current) use of anticoagulants: Secondary | ICD-10-CM | POA: Diagnosis not present

## 2016-05-19 DIAGNOSIS — R339 Retention of urine, unspecified: Secondary | ICD-10-CM | POA: Diagnosis not present

## 2016-05-19 DIAGNOSIS — I129 Hypertensive chronic kidney disease with stage 1 through stage 4 chronic kidney disease, or unspecified chronic kidney disease: Secondary | ICD-10-CM | POA: Diagnosis not present

## 2016-05-19 DIAGNOSIS — G610 Guillain-Barre syndrome: Secondary | ICD-10-CM | POA: Diagnosis not present

## 2016-05-20 ENCOUNTER — Other Ambulatory Visit: Payer: Self-pay | Admitting: *Deleted

## 2016-05-20 DIAGNOSIS — I739 Peripheral vascular disease, unspecified: Secondary | ICD-10-CM | POA: Diagnosis not present

## 2016-05-20 DIAGNOSIS — N289 Disorder of kidney and ureter, unspecified: Secondary | ICD-10-CM | POA: Diagnosis not present

## 2016-05-20 DIAGNOSIS — L03116 Cellulitis of left lower limb: Secondary | ICD-10-CM | POA: Diagnosis not present

## 2016-05-20 DIAGNOSIS — R339 Retention of urine, unspecified: Secondary | ICD-10-CM | POA: Diagnosis not present

## 2016-05-20 DIAGNOSIS — Z7901 Long term (current) use of anticoagulants: Secondary | ICD-10-CM | POA: Diagnosis not present

## 2016-05-20 DIAGNOSIS — N183 Chronic kidney disease, stage 3 (moderate): Secondary | ICD-10-CM | POA: Diagnosis not present

## 2016-05-20 DIAGNOSIS — E785 Hyperlipidemia, unspecified: Secondary | ICD-10-CM | POA: Diagnosis not present

## 2016-05-20 DIAGNOSIS — I129 Hypertensive chronic kidney disease with stage 1 through stage 4 chronic kidney disease, or unspecified chronic kidney disease: Secondary | ICD-10-CM | POA: Diagnosis not present

## 2016-05-20 DIAGNOSIS — Z466 Encounter for fitting and adjustment of urinary device: Secondary | ICD-10-CM | POA: Diagnosis not present

## 2016-05-20 DIAGNOSIS — I4891 Unspecified atrial fibrillation: Secondary | ICD-10-CM | POA: Diagnosis not present

## 2016-05-20 DIAGNOSIS — G610 Guillain-Barre syndrome: Secondary | ICD-10-CM | POA: Diagnosis not present

## 2016-05-20 NOTE — Patient Outreach (Signed)
Transition of care call. Pt is doing very well. He is walking with only a cane now. He has had a follow up with neurology and with his primary car MD yesterday. He will follow up with neurology again in a month. His home health PT has ended and he is waiting to hear from someone if he is goin to go to out pt rehab. He denies any needs at present.   I have told him I am going to follow him for another month but I will call him in 2 weeks and then in 4 weeks. I have encouraged him to call me with any questions or problems.  Deloria Lair [facility] [place] [phone]

## 2016-05-24 DIAGNOSIS — R3914 Feeling of incomplete bladder emptying: Secondary | ICD-10-CM | POA: Diagnosis not present

## 2016-05-27 ENCOUNTER — Ambulatory Visit: Payer: Self-pay | Admitting: *Deleted

## 2016-06-08 DIAGNOSIS — I129 Hypertensive chronic kidney disease with stage 1 through stage 4 chronic kidney disease, or unspecified chronic kidney disease: Secondary | ICD-10-CM | POA: Diagnosis not present

## 2016-06-08 DIAGNOSIS — R339 Retention of urine, unspecified: Secondary | ICD-10-CM | POA: Diagnosis not present

## 2016-06-08 DIAGNOSIS — N183 Chronic kidney disease, stage 3 (moderate): Secondary | ICD-10-CM | POA: Diagnosis not present

## 2016-06-08 DIAGNOSIS — N289 Disorder of kidney and ureter, unspecified: Secondary | ICD-10-CM | POA: Diagnosis not present

## 2016-06-08 DIAGNOSIS — G610 Guillain-Barre syndrome: Secondary | ICD-10-CM | POA: Diagnosis not present

## 2016-06-08 DIAGNOSIS — Z466 Encounter for fitting and adjustment of urinary device: Secondary | ICD-10-CM | POA: Diagnosis not present

## 2016-06-08 DIAGNOSIS — Z7901 Long term (current) use of anticoagulants: Secondary | ICD-10-CM | POA: Diagnosis not present

## 2016-06-08 DIAGNOSIS — I739 Peripheral vascular disease, unspecified: Secondary | ICD-10-CM | POA: Diagnosis not present

## 2016-06-08 DIAGNOSIS — I4891 Unspecified atrial fibrillation: Secondary | ICD-10-CM | POA: Diagnosis not present

## 2016-06-08 DIAGNOSIS — E785 Hyperlipidemia, unspecified: Secondary | ICD-10-CM | POA: Diagnosis not present

## 2016-06-08 DIAGNOSIS — L03116 Cellulitis of left lower limb: Secondary | ICD-10-CM | POA: Diagnosis not present

## 2016-06-10 ENCOUNTER — Ambulatory Visit: Payer: PPO | Admitting: Registered Nurse

## 2016-06-10 ENCOUNTER — Other Ambulatory Visit: Payer: Self-pay | Admitting: *Deleted

## 2016-06-10 NOTE — Patient Outreach (Addendum)
Transition of care call. Pt did not answer the phone. I left a message and requested a return call.  Deloria Lair [facility] [place] [phone]  Mr. Leventhal returned my call and reported he is feeling better and stronger everyday. He has no new problems to report. I have encouraged them to stay out of crowds and wash hands frequently as the flu bug is present in our community.  I have encouraged him to call me for any problem and I will make my last transition of care call to him on February 15th.  Deloria Lair [facility] [place] [phone]

## 2016-06-14 ENCOUNTER — Encounter: Payer: PPO | Attending: Physical Medicine & Rehabilitation | Admitting: Registered Nurse

## 2016-06-14 ENCOUNTER — Encounter: Payer: Self-pay | Admitting: Registered Nurse

## 2016-06-14 VITALS — BP 124/67 | HR 75

## 2016-06-14 DIAGNOSIS — Z800 Family history of malignant neoplasm of digestive organs: Secondary | ICD-10-CM | POA: Insufficient documentation

## 2016-06-14 DIAGNOSIS — Z833 Family history of diabetes mellitus: Secondary | ICD-10-CM | POA: Insufficient documentation

## 2016-06-14 DIAGNOSIS — G610 Guillain-Barre syndrome: Secondary | ICD-10-CM

## 2016-06-14 DIAGNOSIS — N183 Chronic kidney disease, stage 3 (moderate): Secondary | ICD-10-CM | POA: Diagnosis not present

## 2016-06-14 DIAGNOSIS — R269 Unspecified abnormalities of gait and mobility: Secondary | ICD-10-CM | POA: Insufficient documentation

## 2016-06-14 DIAGNOSIS — Z87891 Personal history of nicotine dependence: Secondary | ICD-10-CM | POA: Diagnosis not present

## 2016-06-14 DIAGNOSIS — D329 Benign neoplasm of meninges, unspecified: Secondary | ICD-10-CM | POA: Insufficient documentation

## 2016-06-14 DIAGNOSIS — E785 Hyperlipidemia, unspecified: Secondary | ICD-10-CM | POA: Diagnosis not present

## 2016-06-14 DIAGNOSIS — R339 Retention of urine, unspecified: Secondary | ICD-10-CM | POA: Diagnosis not present

## 2016-06-14 DIAGNOSIS — I129 Hypertensive chronic kidney disease with stage 1 through stage 4 chronic kidney disease, or unspecified chronic kidney disease: Secondary | ICD-10-CM | POA: Diagnosis not present

## 2016-06-14 DIAGNOSIS — G650 Sequelae of Guillain-Barre syndrome: Secondary | ICD-10-CM | POA: Insufficient documentation

## 2016-06-14 DIAGNOSIS — L03116 Cellulitis of left lower limb: Secondary | ICD-10-CM | POA: Insufficient documentation

## 2016-06-14 DIAGNOSIS — R531 Weakness: Secondary | ICD-10-CM | POA: Insufficient documentation

## 2016-06-14 DIAGNOSIS — Z823 Family history of stroke: Secondary | ICD-10-CM | POA: Insufficient documentation

## 2016-06-14 DIAGNOSIS — Z9049 Acquired absence of other specified parts of digestive tract: Secondary | ICD-10-CM | POA: Insufficient documentation

## 2016-06-14 DIAGNOSIS — Z9889 Other specified postprocedural states: Secondary | ICD-10-CM | POA: Insufficient documentation

## 2016-06-14 DIAGNOSIS — I480 Paroxysmal atrial fibrillation: Secondary | ICD-10-CM | POA: Insufficient documentation

## 2016-06-14 DIAGNOSIS — Z8673 Personal history of transient ischemic attack (TIA), and cerebral infarction without residual deficits: Secondary | ICD-10-CM | POA: Insufficient documentation

## 2016-06-14 NOTE — Progress Notes (Signed)
Subjective:    Patient ID: [name], male    DOB: [birth year], 76 y.o.   MRN: [id number]  HPI: [name] is a 76 year old male who returns for follow up appointment regarding GBS. He denies any pain. His current exercise regime is walking and performing stretching exercise. [name] asked about permission to drive, at this time no permission was given for driving. I will speak with Dr. Posey Pronto and give him a call on Wednesday he and his wife verbalizes understanding.   Medical/ Surgical History:  history of HTN, CKD, recent diagnosis of PAF, CVA, crani for meningioma resection,  And  h/o GBS.   Pain Inventory Average Pain 0 Pain Right Now 0 My pain is .  In the last 24 hours, has pain interfered with the following? General activity . Relation with others . Enjoyment of life . What TIME of day is your pain at its worst? . Sleep (in general) .  Pain is worse with: . Pain improves with: . Relief from Meds: .  Mobility use a cane ability to climb steps?  yes do you drive?  no Do you have any goals in this area?  yes  Function retired I need assistance with the following:  bathing Do you have any goals in this area?  yes  Neuro/Psych No problems in this area  Prior Studies Any changes since last visit?  no  Physicians involved in your care Any changes since last visit?  no   Family History  Problem Relation Age of Onset  . Colon cancer Mother     Metastatic   . Cancer Mother     Colon  . Stroke Father   . Diabetes Father    Social History   Social History  . Marital status: Married    Spouse name: N/A  . Number of children: N/A  . Years of education: N/A   Social History Main Topics  . Smoking status: Former Smoker    Types: Cigarettes    Quit date: 05/17/1996  . Smokeless tobacco: Never Used  . Alcohol use 0.6 oz/week    1 Cans of beer per week     Comment: every few months  . Drug use: No  . Sexual activity: Not on file   Other Topics  Concern  . Not on file   Social History Narrative  . No narrative on file   Past Surgical History:  Procedure Laterality Date  . BRAIN SURGERY  1998   Benign brain tumor removed, Left hemisphere- ? Meningioma  . CAROTID ENDARTERECTOMY Left September 13, 2001   LEFT cea  . CHOLECYSTECTOMY  Nov. 2001  . COLON SURGERY  Feb. 2003   Colonic polyps removed endoscopically  . COLONOSCOPY WITH PROPOFOL N/A 11/11/2015   Procedure: COLONOSCOPY WITH PROPOFOL;  Surgeon: Garlan Fair, MD;  [facility]: [facility];  Service: Endoscopy;  Laterality: N/A;  . INGUINAL HERNIA REPAIR  1970's   Past Medical History:  Diagnosis Date  . Atrial fibrillation ([place]) 02/2016   NEW ONSET  . Brain tumor (benign) ([place]) 1998   2 small tumors- since 2015 (Dr. [name] following)  . Carotid artery occlusion   . Cerebrovascular disease September 09, 2001   TIA, Left brain  . CKD (chronic kidney disease), stage III   . Diverticulosis   . ED (erectile dysfunction)   . Guillain-Barre syndrome ([place]) 1999  . Hx of elevated lipids   . Hyperlipidemia   . Hypertension   .  Memory loss   . Stroke [facility]) 2003   There were no vitals taken for this visit.  Opioid Risk Score:   Fall Risk Score:  `1  Depression screen PHQ 2/9  Depression screen PHQ 2/9 05/13/2016  Decreased Interest 0  Down, Depressed, Hopeless 0  PHQ - 2 Score 0  Altered sleeping 0  Tired, decreased energy 0  Change in appetite 0  Feeling bad or failure about yourself  0  Trouble concentrating 0  Moving slowly or fidgety/restless 0  Suicidal thoughts 0  PHQ-9 Score 0  Difficult doing work/chores Not difficult at all   Review of Systems  Constitutional: Negative.   HENT: Negative.   Eyes: Negative.   Respiratory: Negative.   Cardiovascular: Negative.   Gastrointestinal: Negative.   Endocrine: Negative.   Genitourinary: Negative.   Musculoskeletal: Positive for gait problem.  Skin: Negative.   Allergic/Immunologic: Negative.     Hematological: Negative.   Psychiatric/Behavioral: Negative.        Objective:   Physical Exam  Constitutional: He is oriented to person, place, and time. He appears well-developed and well-nourished.  HENT:  Head: Normocephalic and atraumatic.  Neck: Normal range of motion. Neck supple.  Cardiovascular: Normal rate and regular rhythm.   Pulmonary/Chest: Effort normal and breath sounds normal.  Musculoskeletal:  Normal Muscle Bulk and Muscle Testing Reveals: Upper Extremities: Full ROM and Muscle Strength 5/5 Lower Extremities: Full ROM and Muscle Strength 5/5 Arises from chair slowly using straight cane for support Narrow Based Gait  Neurological: He is alert and oriented to person, place, and time.  Skin: Skin is warm and dry.  Psychiatric: He has a normal mood and affect.  Nursing note and vitals reviewed.         Assessment & Plan:  1. Sequela GBS, with history of GBS: He hasn't followed up with Neurology, Mr. And [name] stated there PCP didn't feel he needed Neurology. Wi;ll discuss with Dr. Posey Pronto,  2.  HTN : PCP Following 3. Recurrent meningiomas             Cont to follow Neurosurgery  20 minutes of face to face patient care time was spent during this visit. All questions were encouraged and answered.   F/U in 2 months with Dr. Posey Pronto

## 2016-06-16 ENCOUNTER — Telehealth: Payer: Self-pay | Admitting: Registered Nurse

## 2016-06-16 DIAGNOSIS — L03116 Cellulitis of left lower limb: Secondary | ICD-10-CM | POA: Diagnosis not present

## 2016-06-16 DIAGNOSIS — I4891 Unspecified atrial fibrillation: Secondary | ICD-10-CM | POA: Diagnosis not present

## 2016-06-16 DIAGNOSIS — G610 Guillain-Barre syndrome: Secondary | ICD-10-CM | POA: Diagnosis not present

## 2016-06-16 DIAGNOSIS — E785 Hyperlipidemia, unspecified: Secondary | ICD-10-CM | POA: Diagnosis not present

## 2016-06-16 DIAGNOSIS — Z7901 Long term (current) use of anticoagulants: Secondary | ICD-10-CM | POA: Diagnosis not present

## 2016-06-16 DIAGNOSIS — N289 Disorder of kidney and ureter, unspecified: Secondary | ICD-10-CM | POA: Diagnosis not present

## 2016-06-16 DIAGNOSIS — I129 Hypertensive chronic kidney disease with stage 1 through stage 4 chronic kidney disease, or unspecified chronic kidney disease: Secondary | ICD-10-CM | POA: Diagnosis not present

## 2016-06-16 DIAGNOSIS — N183 Chronic kidney disease, stage 3 (moderate): Secondary | ICD-10-CM | POA: Diagnosis not present

## 2016-06-16 DIAGNOSIS — Z466 Encounter for fitting and adjustment of urinary device: Secondary | ICD-10-CM | POA: Diagnosis not present

## 2016-06-16 DIAGNOSIS — R339 Retention of urine, unspecified: Secondary | ICD-10-CM | POA: Diagnosis not present

## 2016-06-16 DIAGNOSIS — I739 Peripheral vascular disease, unspecified: Secondary | ICD-10-CM | POA: Diagnosis not present

## 2016-06-16 NOTE — Telephone Encounter (Signed)
Placed a call to Mr. Tiggs, no answer left message to return the call. I spoke with Dr. Posey Pronto Mr. Anthis wanted to resume driving, Dr. Posey Pronto would like to know if Mr. Montesdeoca attended Physical Therapy, awaiting Mr. Cerny return call

## 2016-06-17 ENCOUNTER — Telehealth: Payer: Self-pay | Admitting: Registered Nurse

## 2016-06-17 NOTE — Telephone Encounter (Signed)
Placed a call to [name], he was informed our office will obtain the Physical Therapy records from [place], Dr. Posey Pronto will review the physical therapist notes. We will wait on Dr. Posey Pronto decision regarding [name] request to drive. [name] understanding.

## 2016-06-17 NOTE — Telephone Encounter (Signed)
Return [name] call, he states he never received out patient physical therapy, this will be relayed to Dr. Posey Pronto. I will call him back after speaking to Dr. Posey Pronto, he verbalizes understanding.

## 2016-06-23 ENCOUNTER — Telehealth: Payer: Self-pay | Admitting: Registered Nurse

## 2016-06-23 NOTE — Telephone Encounter (Signed)
Placed a call to [name] regarding Dr. Posey Pronto recommendations regarding driving. Dr. Posey Pronto states no driving at this time. Mr. [name] understanding, he would like to see Dr. Posey Pronto he's scheduled for 06/25/2016.

## 2016-06-25 ENCOUNTER — Encounter: Payer: Self-pay | Admitting: Physical Medicine & Rehabilitation

## 2016-06-25 ENCOUNTER — Encounter: Payer: PPO | Attending: Physical Medicine & Rehabilitation | Admitting: Physical Medicine & Rehabilitation

## 2016-06-25 ENCOUNTER — Other Ambulatory Visit: Payer: Self-pay | Admitting: *Deleted

## 2016-06-25 VITALS — BP 152/73 | HR 74

## 2016-06-25 DIAGNOSIS — G610 Guillain-Barre syndrome: Secondary | ICD-10-CM | POA: Diagnosis not present

## 2016-06-25 DIAGNOSIS — Z9049 Acquired absence of other specified parts of digestive tract: Secondary | ICD-10-CM | POA: Diagnosis not present

## 2016-06-25 DIAGNOSIS — Z800 Family history of malignant neoplasm of digestive organs: Secondary | ICD-10-CM | POA: Insufficient documentation

## 2016-06-25 DIAGNOSIS — Z833 Family history of diabetes mellitus: Secondary | ICD-10-CM | POA: Insufficient documentation

## 2016-06-25 DIAGNOSIS — R269 Unspecified abnormalities of gait and mobility: Secondary | ICD-10-CM | POA: Diagnosis not present

## 2016-06-25 DIAGNOSIS — I129 Hypertensive chronic kidney disease with stage 1 through stage 4 chronic kidney disease, or unspecified chronic kidney disease: Secondary | ICD-10-CM | POA: Insufficient documentation

## 2016-06-25 DIAGNOSIS — R531 Weakness: Secondary | ICD-10-CM | POA: Diagnosis not present

## 2016-06-25 DIAGNOSIS — I10 Essential (primary) hypertension: Secondary | ICD-10-CM

## 2016-06-25 DIAGNOSIS — R339 Retention of urine, unspecified: Secondary | ICD-10-CM | POA: Insufficient documentation

## 2016-06-25 DIAGNOSIS — E785 Hyperlipidemia, unspecified: Secondary | ICD-10-CM | POA: Insufficient documentation

## 2016-06-25 DIAGNOSIS — Z87891 Personal history of nicotine dependence: Secondary | ICD-10-CM | POA: Diagnosis not present

## 2016-06-25 DIAGNOSIS — N183 Chronic kidney disease, stage 3 (moderate): Secondary | ICD-10-CM | POA: Diagnosis not present

## 2016-06-25 DIAGNOSIS — I480 Paroxysmal atrial fibrillation: Secondary | ICD-10-CM | POA: Diagnosis not present

## 2016-06-25 DIAGNOSIS — D329 Benign neoplasm of meninges, unspecified: Secondary | ICD-10-CM | POA: Insufficient documentation

## 2016-06-25 DIAGNOSIS — Z823 Family history of stroke: Secondary | ICD-10-CM | POA: Diagnosis not present

## 2016-06-25 DIAGNOSIS — Z9889 Other specified postprocedural states: Secondary | ICD-10-CM | POA: Diagnosis not present

## 2016-06-25 DIAGNOSIS — Z8673 Personal history of transient ischemic attack (TIA), and cerebral infarction without residual deficits: Secondary | ICD-10-CM | POA: Insufficient documentation

## 2016-06-25 DIAGNOSIS — L03116 Cellulitis of left lower limb: Secondary | ICD-10-CM | POA: Diagnosis not present

## 2016-06-25 DIAGNOSIS — G650 Sequelae of Guillain-Barre syndrome: Secondary | ICD-10-CM | POA: Insufficient documentation

## 2016-06-25 NOTE — Patient Instructions (Signed)
Please schedule follow up appointment with Neurosurg for recurrent meningioma

## 2016-06-25 NOTE — Progress Notes (Signed)
Subjective:    Patient ID: [name], male    DOB: [birth year], 76 y.o.   MRN: [id number]  HPI 76 y.o. male with history of HTN, CKD, recent diagnosis of PAF, CVA, crani for meningioma resection, GBS presents for follow up for GBS.    Last clinic visit 05/13/16.  He saw NP on 06/14/16.  Since that time he saw his PCP and per pt's wife (provides all of the history), he was told he does not need to see PCP.  He has done without the Gabapentin.  His BP is elevated, but denies orthostatic symptoms.  He still has not seen Neurosurgery. He did not wean his Urecholine by Urology recs. He is using a cane for ambulation in community.  He denies falls.  He never went to outpatient therapies.   Pain Inventory Average Pain 0 Pain Right Now 0 My pain is na  In the last 24 hours, has pain interfered with the following? General activity 0 Relation with others 0 Enjoyment of life 0 What TIME of day is your pain at its worst? na Sleep (in general) Good  Pain is worse with: na Pain improves with: na Relief from Meds: na  Mobility use a cane use a walker ability to climb steps?  yes do you drive?  no Do you have any goals in this area?  yes  Function retired I need assistance with the following:  dressing and bathing  Neuro/Psych weakness trouble walking  Prior Studies Any changes since last visit?  no  Physicians involved in your care Any changes since last visit?  no   Family History  Problem Relation Age of Onset  . Colon cancer Mother     Metastatic   . Cancer Mother     Colon  . Stroke Father   . Diabetes Father    Social History   Social History  . Marital status: Married    Spouse name: N/A  . Number of children: N/A  . Years of education: N/A   Social History Main Topics  . Smoking status: Former Smoker    Types: Cigarettes    Quit date: 05/17/1996  . Smokeless tobacco: Never Used  . Alcohol use 0.6 oz/week    1 Cans of beer per week     Comment:  every few months  . Drug use: No  . Sexual activity: Not Asked   Other Topics Concern  . None   Social History Narrative  . None   Past Surgical History:  Procedure Laterality Date  . BRAIN SURGERY  1998   Benign brain tumor removed, Left hemisphere- ? Meningioma  . CAROTID ENDARTERECTOMY Left September 13, 2001   LEFT cea  . CHOLECYSTECTOMY  Nov. 2001  . COLON SURGERY  Feb. 2003   Colonic polyps removed endoscopically  . COLONOSCOPY WITH PROPOFOL N/A 11/11/2015   Procedure: COLONOSCOPY WITH PROPOFOL;  Surgeon: Garlan Fair, MD;  [facility];  Service: Endoscopy;  Laterality: N/A;  . INGUINAL HERNIA REPAIR  1970's   Past Medical History:  Diagnosis Date  . Atrial fibrillation ([place]) 02/2016   NEW ONSET  . Brain tumor (benign) ([place]) 1998   2 small tumors- since 2015 (Dr. [name] following)  . Carotid artery occlusion   . Cerebrovascular disease September 09, 2001   TIA, Left brain  . CKD (chronic kidney disease), stage III   . Diverticulosis   . ED (erectile dysfunction)   . Guillain-Barre syndrome ([place]) 1999  .  Hx of elevated lipids   . Hyperlipidemia   . Hypertension   . Memory loss   . Stroke ([place]) 2003   BP (!) 152/73   Pulse 74   SpO2 96%   Opioid Risk Score:   Fall Risk Score:  `1  Depression screen PHQ 2/9  Depression screen PHQ 2/9 05/13/2016  Decreased Interest 0  Down, Depressed, Hopeless 0  PHQ - 2 Score 0  Altered sleeping 0  Tired, decreased energy 0  Change in appetite 0  Feeling bad or failure about yourself  0  Trouble concentrating 0  Moving slowly or fidgety/restless 0  Suicidal thoughts 0  PHQ-9 Score 0  Difficult doing work/chores Not difficult at all   Review of Systems  Constitutional: Negative.   HENT: Negative.   Eyes: Negative.   Respiratory: Negative.   Cardiovascular: Negative.   Gastrointestinal: Negative.   Endocrine: Negative.   Genitourinary: Negative.   Musculoskeletal: Positive for gait problem.  Skin:  Negative.   Allergic/Immunologic: Negative.   Hematological: Negative.   Psychiatric/Behavioral: Negative.   All other systems reviewed and are negative.     Objective:   Physical Exam Constitutional: Well-developed and well-nourished.  NAD.  HENT: Normocephalic, atraumatic.  Eyes: EOMI. No discharge.  Cardiovascular: RRR. No JVD. Respiratory: cta bilaterally. Unlabored. GI: Soft. Bowel sounds are normal.   Musculoskeletal: no edema, no tenderness. Neurological: He is alert and oriented.   Sensory intact to light touch Motor: Motor B/l UE 5/5 proximal to distal RLE: 5/5 proximal to distal LLE: HF, KE 4+/5 ADF/PF 5/5 Skin: skin intact. Warm and dry.  Psychiatric: pleasant and appropriate. Normal mood.    Assessment & Plan:  76 y.o. male with history of HTN, CKD, recent diagnosis of PAF, CVA, crani for meningioma resection, GBS presents for follow up for GBS.    1. Sequela GBS, with history of GBS  Referral for outpt PT  Pt was told by PCP he does not need follow up with Neurology  Pt would like permission for driving, however, this was discussed with NP and communicated to Patient.  HH therapy notes requested and reviewed suggesting unsteady gait and assistance with some tasks.  Do not recommend driving at this time.   2. Pain Management  Controlled off medications  3.  HTN with Orthostasis  Meds being adjusted by PCP  Cont to follow with PCP.  Does not need TED per PCP.  Elevated today  4. Recurrent meningiomas  Follow Neurosurgery, pt has not scheduled appointment.  5. Urinary retention:  Meds being adjusted by Urology, pt to have follow up appointment in April  6. Neurologic gait disorder  Outpatient referral for therapies  Cont cane for safety

## 2016-06-26 NOTE — Patient Outreach (Signed)
Follow up Nurse line call. Wife had called the nurse line about elevated BP 160's/90's. Pt saw MD on Friday and was reassured not to panic. Pt was taken off metoprolol related to hypotension. Mrs. [name] says his BP is usually normal <120/80. She also told a story of a an o'possum getting in their garage and making a ruckus and this probably led to his elevated BP.  I advised if his BP goes over 180/100 they should call me to discuss. I will be calling him again on Thursday 07/02/15.  Deloria Lair [facility] [place] [phone]

## 2016-07-01 ENCOUNTER — Ambulatory Visit (INDEPENDENT_AMBULATORY_CARE_PROVIDER_SITE_OTHER): Payer: PPO | Admitting: Cardiology

## 2016-07-01 ENCOUNTER — Encounter: Payer: Self-pay | Admitting: *Deleted

## 2016-07-01 ENCOUNTER — Other Ambulatory Visit: Payer: Self-pay | Admitting: *Deleted

## 2016-07-01 ENCOUNTER — Encounter: Payer: Self-pay | Admitting: Cardiology

## 2016-07-01 VITALS — BP 120/66 | HR 88 | Ht 71.0 in | Wt 219.2 lb

## 2016-07-01 DIAGNOSIS — I480 Paroxysmal atrial fibrillation: Secondary | ICD-10-CM | POA: Diagnosis not present

## 2016-07-01 NOTE — Patient Instructions (Signed)

## 2016-07-01 NOTE — Progress Notes (Signed)
Electrophysiology Office Note   Date:  07/01/2016   ID:  Harland German, [place] 1940-08-17, MRN [id number]  PCP:  Irven Shelling, MD  Primary Electrophysiologist:  Constance Haw, MD    Chief Complaint  Patient presents with  . Follow-up    PAF     History of Present Illness: [name] is a 76 y.o. male who presents today for electrophysiology evaluation.   He was admitted to the hospital at the end of October with new onset atrial fibrillation, renal failure, left knee cellulitis. He was rate controlled with 120 mg of diltiazem as well as metoprolol and anticoagulated with warfarin. He presents to clinic today as a follow-up post hospitalization. He says that he has been feeling well and gaining strength. He was seen by orthopedics who say that his knee is improving with no further evidence of cellulitis.   Today, he denies symptoms of palpitations, chest pain, shortness of breath, orthopnea, PND, lower extremity edema, claudication, dizziness, presyncope, syncope, bleeding, or neurologic sequela. The patient is tolerating medications without difficulties and is otherwise without complaint today. He says that he feels that he is in sinus rhythm without major complaint. He did have a hospitalization in November due to [place] syndrome. He says that he has been improving. His deficiencies were due to gait imbalance and falls. He is currently now walking with a walker.   Past Medical History:  Diagnosis Date  . Atrial fibrillation ([place]) 02/2016   NEW ONSET  . Brain tumor (benign) ([place]) 1998   2 small tumors- since 2015 (Dr. [name] following)  . Carotid artery occlusion   . Cerebrovascular disease September 09, 2001   TIA, Left brain  . CKD (chronic kidney disease), stage III   . Diverticulosis   . ED (erectile dysfunction)   . Guillain-Barre syndrome ([place]) 1999  . Hx of elevated lipids   . Hyperlipidemia   . Hypertension   . Memory loss   . Stroke [facility]) 2003    Past Surgical History:  Procedure Laterality Date  . BRAIN SURGERY  1998   Benign brain tumor removed, Left hemisphere- ? Meningioma  . CAROTID ENDARTERECTOMY Left September 13, 2001   LEFT cea  . CHOLECYSTECTOMY  Nov. 2001  . COLON SURGERY  Feb. 2003   Colonic polyps removed endoscopically  . COLONOSCOPY WITH PROPOFOL N/A 11/11/2015   Procedure: COLONOSCOPY WITH PROPOFOL;  Surgeon: Garlan Fair, MD;  Location: [facility];  Service: Endoscopy;  Laterality: N/A;  . INGUINAL HERNIA REPAIR  1970's     Current Outpatient Prescriptions  Medication Sig Dispense Refill  . bethanechol (URECHOLINE) 25 MG tablet     . cholecalciferol (VITAMIN D) 1000 units tablet Take 1,000 Units by mouth daily.    . tamsulosin (FLOMAX) 0.4 MG CAPS capsule     . vitamin B-12 (CYANOCOBALAMIN) 1000 MCG tablet Take 1,000 mcg by mouth 2 (two) times a week. Wednesday and Saturday    . warfarin (COUMADIN) 5 MG tablet Take 1 tablet (5 mg total) by mouth daily at 6 PM. 30 tablet 0   No current facility-administered medications for this visit.     Allergies:   Immune globulins; Influenza vaccines; and [place]a [aspirin]   Social History:  The patient  reports that he quit smoking about 20 years ago. His smoking use included Cigarettes. He has never used smokeless tobacco. He reports that he drinks about 0.6 oz of alcohol per week . He reports that he does not  use drugs.   Family History:  The patient's family history includes Cancer in his mother; Colon cancer in his mother; Diabetes in his father; Stroke in his father.    ROS:  Please see the history of present illness.   Otherwise, review of systems is positive for none.   All other systems are reviewed and negative.    PHYSICAL EXAM: VS:  BP 120/66   Pulse 88   Ht 5\' 11"  (1.803 m)   Wt 219 lb 3.2 oz (99.4 kg)   BMI 30.57 kg/m  , BMI Body mass index is 30.57 kg/m. GEN: Well nourished, well developed, in no acute distress  HEENT: normal  Neck: no JVD,  carotid bruits, or masses Cardiac: RRR; no murmurs, rubs, or gallops,no edema  Respiratory:  clear to auscultation bilaterally, normal work of breathing GI: soft, nontender, nondistended, + BS MS: no deformity or atrophy  Skin: warm and dry Neuro:  Strength and sensation are intact Psych: euthymic mood, full affect  EKG:  EKG is ordered today. Personal review of the ekg ordered 04/08/16 shows sinus rhythm, rate 65, septal infarct  Recent Labs: 03/07/2016: B Natriuretic Peptide 337.6 04/04/2016: TSH 4.274 04/13/2016: Magnesium 2.0 04/15/2016: ALT 21 04/28/2016: BUN 20; Creatinine, Ser 1.49; Hemoglobin 12.2; Platelets 224; Potassium 4.1; Sodium 141    Lipid Panel  No results found for: CHOL, TRIG, HDL, CHOLHDL, VLDL, LDLCALC, LDLDIRECT   Wt Readings from Last 3 Encounters:  07/01/16 219 lb 3.2 oz (99.4 kg)  04/28/16 199 lb 11.8 oz (90.6 kg)  04/14/16 219 lb 14.4 oz (99.7 kg)      Other studies Reviewed: Additional studies/ records that were reviewed today include: TTE 03/07/16  Review of the above records today demonstrates:  - Left ventricle: The cavity size was normal. Wall thickness was   normal. Systolic function was normal. The estimated ejection   fraction was in the range of 60% to 65%. Wall motion was normal;   there were no regional wall motion abnormalities. - Aortic valve: Valve area (VTI): 1.68 cm^2. Valve area (Vmax):   1.71 cm^2. Valve area (Vmean): 1.68 cm^2. - Left atrium: The atrium was mildly dilated. - Right atrium: The atrium was mildly dilated.   ASSESSMENT AND PLAN:  1.  Paroxysmal Atrial fibrillation: on coumadin. He was taken off of his metoprolol and possibly his diltiazem for low blood pressures. We'll continue him on his Coumadin for stroke prevention. Should he have any further episodes of atrial fibrillation, would plan for medical management.   This patients CHA2DS2-VASc Score and unadjusted Ischemic Stroke Rate (% per year) is equal to 7.2  % stroke rate/year from a score of 5  Above score calculated as 1 point each if present [CHF, HTN, DM, Vascular=MI/PAD/Aortic Plaque, Age if 65-74, or Male] Above score calculated as 2 points each if present [Age > 75, or Stroke/TIA/TE]  2. Hypertension: Currently well controlled   Current medicines are reviewed at length with the patient today.   The patient does not have concerns regarding his medicines.  The following changes were made today:  none  Labs/ tests ordered today include:  No orders of the defined types were placed in this encounter.    Disposition:   FU with Ellysa Parrack 6 months  Signed, Sergio Hobart Meredith Leeds, MD  07/01/2016 11:23 AM     [facility] [address] [place] [address] [phone] (office) [phone] (fax)

## 2016-07-01 NOTE — Patient Outreach (Signed)
Case closure telephone call. I spoke to Mr. [name] today. He is doing very well. He has recovered from his episode of GBS, but his MD wishes for him to continue some PT before he gives the OK for him to drive. He will be starting this next week. Mr. [name] reports his blood pressure has been running closer to target this week, but it does have a wide range.  He has met his transition of care goals and I am closing his case today. I have reinforced that I will be available to him if he has questions or future problems that require assistance.  [facility] [place] [phone]

## 2016-07-02 ENCOUNTER — Ambulatory Visit: Payer: PPO | Admitting: Cardiology

## 2016-07-05 ENCOUNTER — Ambulatory Visit
Payer: PPO | Attending: Physical Medicine & Rehabilitation | Admitting: Rehabilitative and Restorative Service Providers"

## 2016-07-05 DIAGNOSIS — D429 Neoplasm of uncertain behavior of meninges, unspecified: Secondary | ICD-10-CM | POA: Diagnosis not present

## 2016-07-05 DIAGNOSIS — R2681 Unsteadiness on feet: Secondary | ICD-10-CM

## 2016-07-05 DIAGNOSIS — R2689 Other abnormalities of gait and mobility: Secondary | ICD-10-CM | POA: Diagnosis not present

## 2016-07-05 NOTE — Therapy (Signed)
[facility] [address] [place], [place], [address] Phone: [phone]   Fax:  [phone]  Physical Therapy Evaluation  Patient Details  Name: [name] MRN: [id number] Date of Birth: 1941/04/16 Referring Provider: Dr. Posey Pronto  Encounter Date: 07/05/2016      PT End of Session - 07/05/16 1152    Visit Number 1   Number of Visits 6   Date for PT Re-Evaluation 08/19/16   Authorization Type G code every 10th visit   PT Start Time 0935   PT Stop Time 1018   PT Time Calculation (min) 43 min   Activity Tolerance Patient tolerated treatment well   Behavior During Therapy [facility] for tasks assessed/performed      Past Medical History:  Diagnosis Date  . Atrial fibrillation ([place]) 02/2016   NEW ONSET  . Brain tumor (benign) ([place]) 1998   2 small tumors- since 2015 (Dr. [name] following)  . Carotid artery occlusion   . Cerebrovascular disease September 09, 2001   TIA, Left brain  . CKD (chronic kidney disease), stage III   . Diverticulosis   . ED (erectile dysfunction)   . Guillain-Barre syndrome ([place]) 1999  . Hx of elevated lipids   . Hyperlipidemia   . Hypertension   . Memory loss   . Stroke [facility]) 2003    Past Surgical History:  Procedure Laterality Date  . BRAIN SURGERY  1998   Benign brain tumor removed, Left hemisphere- ? Meningioma  . CAROTID ENDARTERECTOMY Left September 13, 2001   LEFT cea  . CHOLECYSTECTOMY  Nov. 2001  . COLON SURGERY  Feb. 2003   Colonic polyps removed endoscopically  . COLONOSCOPY WITH PROPOFOL N/A 11/11/2015   Procedure: COLONOSCOPY WITH PROPOFOL;  Surgeon: Garlan Fair, MD;  [facility];  Service: Endoscopy;  Laterality: N/A;  . INGUINAL HERNIA REPAIR  1970's    There were no vitals filed for this visit.       Subjective Assessment - 07/05/16 0943    Subjective The patient was admitted 04/04/16 due to falls, LE tingling, imbalance and found to have GBS.  He  underwent IP rehab and [facility].  He is walking indoors without a device and continuing to use SPC in the community.   Pertinent History h/o meningioma resection 1998, CSF infection, current meningioma followed by neurosurgery for monitoring, CVA 08/2001, CKD.   Patient Stated Goals Weakness is a continued deficit.   Currently in Pain? No/denies            [facility] PT Assessment - 07/05/16 0945      Assessment   Medical Diagnosis Guillian Barre syndrome   Referring Provider Dr. Posey Pronto   Onset Date/Surgical Date 04/04/16   Prior Therapy acute, IP rehab, [facility] PT     Precautions   Precautions Fall     Balance Screen   Has the patient fallen in the past 6 months Yes   How many times? 4- at onset of GBS   Has the patient had a decrease in activity level because of a fear of falling?  Yes   Is the patient reluctant to leave their home because of a fear of falling?  No     Home Environment   Living Environment Private residence   Living Arrangements Spouse/significant other   Type of [place] to enter   Entrance Stairs-Number of Steps 2   Entrance Stairs-Rails Can reach both   [place]  Two level;Able to live on main level with bedroom/bathroom   Alternate Level Stairs-Number of Steps 12-15   Alternate Level Stairs-Rails Right   Home Equipment [place] - single point;Shower seat;Bedside commode     Prior Function   Level of Independence Independent   Leisure was independent prior to 02/2016 in which he developed kidney and cardiac issues     Cognition   Overall Cognitive Status Within Functional Limits for tasks assessed     Observation/Other Assessments   Focus on Therapeutic Outcomes (FOTO)  61%   Other Surveys  Other Surveys   Activities of Balance Confidence Scale (ABC Scale)  61.3%     Sensation   Additional Comments Patient notes that sensation for light touch is intact.  INitially had distal fingertip numbness, which he notes is resolved.     ROM /  Strength   AROM / PROM / Strength AROM;Strength     AROM   Overall AROM  Within functional limits for tasks performed     Strength   Overall Strength Comments 5/5 bilateral UEs for shoulder flexion, abduction, elbow flexion/extension.   5/5 for bilateral hip flexion, knee flexion/extension, and ankle dorsiflexion.      Bed Mobility   Bed Mobility --  independent with all bed mobility     Transfers   Transfers Sit to Stand   Sit to Stand 7: Independent   Five time sit to stand comments  13.88     Ambulation/Gait   Ambulation/Gait Yes   Ambulation/Gait Assistance 6: Modified independent (Device/Increase time)   Ambulation Distance (Feet) 250 Feet   Assistive device Straight cane   Gait Pattern --  R antalgic gait pattern   Ambulation Surface Level   Gait velocity 2.41 ft/sec   Stairs Yes   Stairs Assistance 6: Modified independent (Device/Increase time)   Stair Management Technique Step to pattern;Two rails   Number of Stairs 4     Standardized Balance Assessment   Standardized Balance Assessment Berg Balance Test;Timed Up and Go Test     Berg Balance Test   Sit to Stand Able to stand  independently using hands   Standing Unsupported Able to stand safely 2 minutes   Sitting with Back Unsupported but Feet Supported on Floor or Stool Able to sit safely and securely 2 minutes   Stand to Sit Sits safely with minimal use of hands   Transfers Able to transfer safely, definite need of hands   Standing Unsupported with Eyes Closed Able to stand 10 seconds safely   Standing Ubsupported with Feet Together Able to place feet together independently and stand 1 minute safely   From Standing, Reach Forward with Outstretched Arm Can reach forward >12 cm safely (5")   From Standing Position, Pick up Object from Floor Able to pick up shoe safely and easily   From Standing Position, Turn to Look Behind Over each Shoulder Looks behind from both sides and weight shifts well   Turn 360 Degrees  Able to turn 360 degrees safely but slowly   Standing Unsupported, Alternately Place Feet on Step/Stool Able to stand independently and safely and complete 8 steps in 20 seconds   Standing Unsupported, One Foot in Front Able to take small step independently and hold 30 seconds   Standing on One Leg Able to lift leg independently and hold equal to or more than 3 seconds   Total Score 47   Berg comment: 47/56      Timed Up and Go Test  TUG --  11.59 seconds                           PT Education - 07/05/16 1151    Education provided Yes   Education Details HEP: sit<>stand x 10 reps, heel raises dec'ing UE support   Person(s) Educated Patient;Spouse   Methods Explanation;Demonstration;Handout   Comprehension Verbalized understanding;Returned demonstration          PT Short Term Goals - 07/05/16 1158      PT SHORT TERM GOAL #1   Title The patient will be indep with HEP for balance, LE strength and general mobility.   Baseline Target date 08/03/16   Time 4   Period Weeks     PT SHORT TERM GOAL #2   Title The patient will demonstrate stair negotiation reciprocal pattern x 8 steps with one handrail modified indep.   Baseline Target date 08/03/16   Time 4   Period Weeks           PT Long Term Goals - 07/05/16 1156      PT LONG TERM GOAL #1   Title The patient will improve ABC scale from 61.3% up to > or equal to 72% to demo improved confidence with balance.   Baseline Target date: 08/19/2016   Time 6   Period Weeks     PT LONG TERM GOAL #2   Title The patient will improve gait speed from 2.41 ft/sec up to > or equal to 2.8 ft/sec to demo full community ambulation.   Baseline Target date 08/19/2016   Time 6   Period Weeks     PT LONG TERM GOAL #3   Title The patient will improve Berg score from 47/56 up to > or equal to 51/56 to demo improving high level balance.   Baseline Target date 08/19/2016   Time 6   Period Weeks               Plan -  07/05/16 1159    Clinical Impression Statement The patient is a 76 year old male presenting to OP PT s/p onset of Guillian Barre in 03/2016.  He presents with independence with household mobility, limitations with return to prior community tasks.  He does not score in a high fall risk range and PT will focus on return to prior tasks including outdoor work at his home, negotiating community surfaces and improving high level balance and gait tasks.     Rehab Potential Good   PT Frequency 1x / week   PT Duration 6 weeks   PT Treatment/Interventions ADLs/Self Care Home Management;Gait training;Stair training;Functional mobility training;Patient/family education;Neuromuscular re-education;Therapeutic exercise;Therapeutic activities;Balance training   PT Next Visit Plan Check HEP, add high level balance tasks, dynamic gait activities; perform FGA.   Consulted and Agree with Plan of Care Patient      Patient will benefit from skilled therapeutic intervention in order to improve the following deficits and impairments:  Abnormal gait, Difficulty walking, Decreased mobility, Decreased balance  Visit Diagnosis: Other abnormalities of gait and mobility  Unsteadiness on feet     Problem List Patient Active Problem List   Diagnosis Date Noted  . GBS (Guillain Barre syndrome) ([place]) 04/14/2016  . Neuropathic pain   . Leukocytosis   . Chronic anticoagulation   . Benign essential HTN   . Stage 3 chronic kidney disease   . PAF (paroxysmal atrial fibrillation) ([place])   . History of meningioma   .  Urinary retention   . History of Guillain-Barre syndrome   . Weakness of both lower extremities   . Leg weakness 04/04/2016  . Guillain Barr syndrome ([place]) 04/04/2016  . Cellulitis of left knee 03/12/2016  . Atrial fibrillation, new onset ([place]) 03/07/2016  . Urinary tract infection without hematuria 03/07/2016  . Renal insufficiency 03/07/2016  . Hypokalemia 03/07/2016  . History of CVA  (cerebrovascular accident) 03/07/2016  . HTN (hypertension) 03/07/2016  . Atrial fibrillation ([place]) 03/07/2016  . Acute urinary retention   . Occlusion and stenosis of carotid artery without mention of cerebral infarction 07/11/2012  . Carotid stenosis 07/11/2012    Katerine Morua, PT 07/05/2016, 12:02 PM  [facility] [address] [place], [place], [address] Phone: [phone]   Fax:  [phone]  Name: [name] MRN: [id number] Date of Birth: 16-Sep-1940

## 2016-07-05 NOTE — Patient Instructions (Signed)
Functional Quadriceps: Sit to Stand    Sit on edge of chair, feet flat on floor. Stand upright, extending knees fully. Repeat __10__ times per set. Do __2__ sets per session. Do __2__ sessions per day.  http://orth.exer.us/734   Copyright  VHI. All rights reserved.   Heel Raise: Bilateral (Standing)    Rise on balls of feet. Repeat _20___ times per set. Do __1__ sets per session. Do _2___ sessions per day.  http://orth.exer.us/38   Copyright  VHI. All rights reserved.

## 2016-07-13 DIAGNOSIS — Z7901 Long term (current) use of anticoagulants: Secondary | ICD-10-CM | POA: Diagnosis not present

## 2016-07-16 ENCOUNTER — Ambulatory Visit
Payer: PPO | Attending: Physical Medicine & Rehabilitation | Admitting: Rehabilitative and Restorative Service Providers"

## 2016-07-16 DIAGNOSIS — R2689 Other abnormalities of gait and mobility: Secondary | ICD-10-CM | POA: Insufficient documentation

## 2016-07-16 DIAGNOSIS — R2681 Unsteadiness on feet: Secondary | ICD-10-CM | POA: Diagnosis not present

## 2016-07-16 NOTE — Therapy (Signed)
[facility] [address] [place], [place], [address] Phone: [phone]   Fax:  [phone]  Physical Therapy Treatment  Patient Details  Name: [name] MRN: [id number] Date of Birth: 11/27/1940 Referring Provider: Dr. Posey Pronto  Encounter Date: 07/16/2016      PT End of Session - 07/16/16 1051    Visit Number 2   Number of Visits 6   Date for PT Re-Evaluation 08/19/16   Authorization Type G code every 10th visit   PT Start Time 1025   PT Stop Time 1105   PT Time Calculation (min) 40 min   Activity Tolerance Patient tolerated treatment well   Behavior During Therapy [facility] for tasks assessed/performed      Past Medical History:  Diagnosis Date  . Atrial fibrillation ([place]) 02/2016   NEW ONSET  . Brain tumor (benign) ([place]) 1998   2 small tumors- since 2015 (Dr. [name] following)  . Carotid artery occlusion   . Cerebrovascular disease September 09, 2001   TIA, Left brain  . CKD (chronic kidney disease), stage III   . Diverticulosis   . ED (erectile dysfunction)   . Guillain-Barre syndrome ([place]) 1999  . Hx of elevated lipids   . Hyperlipidemia   . Hypertension   . Memory loss   . Stroke [facility]) 2003    Past Surgical History:  Procedure Laterality Date  . BRAIN SURGERY  1998   Benign brain tumor removed, Left hemisphere- ? Meningioma  . CAROTID ENDARTERECTOMY Left September 13, 2001   LEFT cea  . CHOLECYSTECTOMY  Nov. 2001  . COLON SURGERY  Feb. 2003   Colonic polyps removed endoscopically  . COLONOSCOPY WITH PROPOFOL N/A 11/11/2015   Procedure: COLONOSCOPY WITH PROPOFOL;  Surgeon: Garlan Fair, MD;  Location: [facility];  Service: Endoscopy;  Laterality: N/A;  . INGUINAL HERNIA REPAIR  1970's    There were no vitals filed for this visit.      Subjective Assessment - 07/16/16 1028    Subjective The patient reports he is walking around the house, too cold to be outdoors.   Patient Stated Goals  Weakness is a continued deficit.   Currently in Pain? No/denies                         [facility] Adult PT Treatment/Exercise - 07/16/16 1118      Transfers   Transfers Sit to Stand   Sit to Stand 7: Independent     Ambulation/Gait   Ambulation/Gait Yes   Ambulation/Gait Assistance 6: Modified independent (Device/Increase time)   Ambulation Distance (Feet) 500 Feet   Assistive device None   Ambulation Surface Level   Gait Comments Gait activities working on longer stride length, bilateral arm swing, larger amplitude movements. Gait ambulating forward on toes x 20 feet x 3 reps and then backwards x 20 ft x 3 reps.     Posture/Postural Control   Posture/Postural Control Postural limitations   Postural Limitations Rounded Shoulders;Decreased lumbar lordosis;Increased thoracic kyphosis   Posture Comments Performed towel roll stretch supine *added to HEP* for anterior chest wall stretching.      Neuro Re-ed    Neuro Re-ed Details  Wall bumps for hip initiation, standing weight shifting laterally for large amplitude movements with postural upright. Alternating foot taps emphasizing postural upright x 10 reps.       Exercises   Exercises Other Exercises   Other Exercises  Performed supine gluteal contraction  with bridges, unilateral hip extension into ball x 5 reps each side, lumbar towel roll stretch (towel perpendicular to spine).  Hip flexor stretching PROM thomas test.  "W" x 10 reps standing. Hip flexion/extension with countertop leaning; standing rotation spine alternating support through countertop.  Step ups R and then L x 10 reps with UE support to 6" step.                 PT Education - 07/16/16 1116    Education provided Yes   Education Details HEP: added bridges and towel roll stretch to HEP   Person(s) Educated Patient   Methods Explanation;Demonstration;Handout   Comprehension Verbalized understanding;Returned demonstration          PT Short Term  Goals - 07/05/16 1158      PT SHORT TERM GOAL #1   Title The patient will be indep with HEP for balance, LE strength and general mobility.   Baseline Target date 08/03/16   Time 4   Period Weeks     PT SHORT TERM GOAL #2   Title The patient will demonstrate stair negotiation reciprocal pattern x 8 steps with one handrail modified indep.   Baseline Target date 08/03/16   Time 4   Period Weeks           PT Long Term Goals - 07/05/16 1156      PT LONG TERM GOAL #1   Title The patient will improve ABC scale from 61.3% up to > or equal to 72% to demo improved confidence with balance.   Baseline Target date: 08/19/2016   Time 6   Period Weeks     PT LONG TERM GOAL #2   Title The patient will improve gait speed from 2.41 ft/sec up to > or equal to 2.8 ft/sec to demo full community ambulation.   Baseline Target date 08/19/2016   Time 6   Period Weeks     PT LONG TERM GOAL #3   Title The patient will improve Berg score from 47/56 up to > or equal to 51/56 to demo improving high level balance.   Baseline Target date 08/19/2016   Time 6   Period Weeks               Plan - 07/16/16 1126    Clinical Impression Statement The patient has postural shortening and PT emphasized strengthening/flexibility for posture to help with balance and gait.  Patient's wife noted at end of session that patient will be f/u with neurosurgeon regarding removal of brain tumor --next appt in April.  PT to continue working towards STGs/LTGs.   PT Treatment/Interventions ADLs/Self Care Home Management;Gait training;Stair training;Functional mobility training;Patient/family education;Neuromuscular re-education;Therapeutic exercise;Therapeutic activities;Balance training   PT Next Visit Plan Check HEP; perform FGA, dynamic gait/unlevel/outdoor surfaces, high level balance (foam + rocker board)   Consulted and Agree with Plan of Care Patient      Patient will benefit from skilled therapeutic intervention in  order to improve the following deficits and impairments:  Abnormal gait, Difficulty walking, Decreased mobility, Decreased balance  Visit Diagnosis: Other abnormalities of gait and mobility  Unsteadiness on feet     Problem List Patient Active Problem List   Diagnosis Date Noted  . GBS (Guillain Barre syndrome) ([place]) 04/14/2016  . Neuropathic pain   . Leukocytosis   . Chronic anticoagulation   . Benign essential HTN   . Stage 3 chronic kidney disease   . PAF (paroxysmal atrial fibrillation) ([place])   .  History of meningioma   . Urinary retention   . History of Guillain-Barre syndrome   . Weakness of both lower extremities   . Leg weakness 04/04/2016  . Guillain Barr syndrome ([place]) 04/04/2016  . Cellulitis of left knee 03/12/2016  . Atrial fibrillation, new onset ([place]) 03/07/2016  . Urinary tract infection without hematuria 03/07/2016  . Renal insufficiency 03/07/2016  . Hypokalemia 03/07/2016  . History of CVA (cerebrovascular accident) 03/07/2016  . HTN (hypertension) 03/07/2016  . Atrial fibrillation ([place]) 03/07/2016  . Acute urinary retention   . Occlusion and stenosis of carotid artery without mention of cerebral infarction 07/11/2012  . Carotid stenosis 07/11/2012    Chelsye Suhre, PT 07/16/2016, 11:36 AM  [facility] [address] [place], [place], [address] Phone: [phone]   Fax:  [phone]  Name: [name] MRN: [id number] Date of Birth: [birth year]

## 2016-07-16 NOTE — Patient Instructions (Signed)
Functional Quadriceps: Sit to Stand    Sit on edge of chair, feet flat on floor. Stand upright, extending knees fully. Repeat __10__ times per set. Do __2__ sets per session. Do __2__ sessions per day.  http://orth.exer.us/734   Copyright  VHI. All rights reserved.   Heel Raise: Bilateral (Standing)    Rise on balls of feet. Repeat _20___ times per set. Do __1__ sets per session. Do _2___ sessions per day.  http://orth.exer.us/38   Copyright  VHI. All rights reserved.    Bracing With Bridging (Hook-Lying)    With neutral spine, tighten pelvic floor and abdominals and hold. Lift bottom. Repeat _10-15__ times. Do _2__ times a day.   Copyright  VHI. All rights reserved.   Thoracic Self-Mobilization (Supine)    With rolled towel placed lengthwise at lower ribs level, lie back on towel with arms outstretched. Hold __2 minutes. Relax. Repeat __1__ times per set. Do __1-2__ sessions per day.  http://orth.exer.us/1000   Copyright  VHI. All rights reserved.

## 2016-07-19 ENCOUNTER — Ambulatory Visit: Payer: PPO | Admitting: Rehabilitative and Restorative Service Providers"

## 2016-07-19 DIAGNOSIS — R2689 Other abnormalities of gait and mobility: Secondary | ICD-10-CM

## 2016-07-19 DIAGNOSIS — R2681 Unsteadiness on feet: Secondary | ICD-10-CM

## 2016-07-19 NOTE — Therapy (Signed)
[facility] [address] [place], [place], [address] Phone: [phone]   Fax:  [phone]  Physical Therapy Treatment  Patient Details  Name: [name] MRN: [id number] Date of Birth: 04/20/1941 Referring Provider: Dr. Posey Pronto  Encounter Date: 07/19/2016      PT End of Session - 07/19/16 1033    Visit Number 3   Number of Visits 6   Date for PT Re-Evaluation 08/19/16   Authorization Type G code every 10th visit   PT Start Time 0935   PT Stop Time 1020   PT Time Calculation (min) 45 min   Equipment Utilized During Treatment Gait belt   Activity Tolerance Patient tolerated treatment well   Behavior During Therapy [facility] for tasks assessed/performed      Past Medical History:  Diagnosis Date  . Atrial fibrillation ([place]) 02/2016   NEW ONSET  . Brain tumor (benign) ([place]) 1998   2 small tumors- since 2015 (Dr. [name] following)  . Carotid artery occlusion   . Cerebrovascular disease September 09, 2001   TIA, Left brain  . CKD (chronic kidney disease), stage III   . Diverticulosis   . ED (erectile dysfunction)   . Guillain-Barre syndrome ([place]) 1999  . Hx of elevated lipids   . Hyperlipidemia   . Hypertension   . Memory loss   . Stroke [facility]) 2003    Past Surgical History:  Procedure Laterality Date  . BRAIN SURGERY  1998   Benign brain tumor removed, Left hemisphere- ? Meningioma  . CAROTID ENDARTERECTOMY Left September 13, 2001   LEFT cea  . CHOLECYSTECTOMY  Nov. 2001  . COLON SURGERY  Feb. 2003   Colonic polyps removed endoscopically  . COLONOSCOPY WITH PROPOFOL N/A 11/11/2015   Procedure: COLONOSCOPY WITH PROPOFOL;  Surgeon: Garlan Fair, MD;  Location: [facility];  Service: Endoscopy;  Laterality: N/A;  . INGUINAL HERNIA REPAIR  1970's    There were no vitals filed for this visit.      Subjective Assessment - 07/19/16 0936    Subjective The patient is doing new HEP.   Pertinent History h/o  meningioma resection 1998, CSF infection, current meningioma followed by neurosurgery for monitoring, CVA 08/2001, CKD.   Patient Stated Goals Weakness is a continued deficit.   Currently in Pain? No/denies            [facility] PT Assessment - 07/19/16 0936      Ambulation/Gait   Ambulation Surface Level   Gait velocity 2.51 ft/sec   Stairs Yes   Stairs Assistance 6: Modified independent (Device/Increase time)   Stair Management Technique Two rails;Alternating pattern     Functional Gait  Assessment   Gait assessed  Yes   Gait Level Surface Walks 20 ft, slow speed, abnormal gait pattern, evidence for imbalance or deviates 10-15 in outside of the 12 in walkway width. Requires more than 7 sec to ambulate 20 ft.  7.56 sec   Change in Gait Speed Able to change speed, demonstrates mild gait deviations, deviates 6-10 in outside of the 12 in walkway width, or no gait deviations, unable to achieve a major change in velocity, or uses a change in velocity, or uses an assistive device.   Gait with Horizontal Head Turns Performs head turns with moderate changes in gait velocity, slows down, deviates 10-15 in outside 12 in walkway width but recovers, can continue to walk.   Gait with Vertical Head Turns Performs task with slight change in gait  velocity (eg, minor disruption to smooth gait path), deviates 6 - 10 in outside 12 in walkway width or uses assistive device   Gait and Pivot Turn Pivot turns safely in greater than 3 sec and stops with no loss of balance, or pivot turns safely within 3 sec and stops with mild imbalance, requires small steps to catch balance.   Step Over Obstacle Is able to step over one shoe box (4.5 in total height) but must slow down and adjust steps to clear box safely. May require verbal cueing.   Gait with Narrow Base of Support Ambulates less than 4 steps heel to toe or cannot perform without assistance.   Gait with Eyes Closed Walks 20 ft, slow speed, abnormal gait pattern,  evidence for imbalance, deviates 10-15 in outside 12 in walkway width. Requires more than 9 sec to ambulate 20 ft.   Ambulating Backwards Walks 20 ft, uses assistive device, slower speed, mild gait deviations, deviates 6-10 in outside 12 in walkway width.   Steps Alternating feet, must use rail.   Total Score 14   FGA comment: 14/30 indicating fall risk                     [facility] Adult PT Treatment/Exercise - 07/19/16 1610      Ambulation/Gait   Ambulation/Gait Yes   Gait Comments The patient ambulated on unlevel surfaces including rubber mulch, pinestraw, up/down curbs, gravel, up/down inclines with SPC and supervision.  Recommended he begin performing for home.  Tolerated distance of 400 ft today.     Neuro Re-ed    Neuro Re-ed Details  Standing compliant surface training on balance board (compliant) and rocker board attempting to decrease UE reliance iwth min A adding head motion when tolerable.  Standing alternating foot taps over obstacle R and L dec'ing UE support with min A.     Exercises   Exercises Other Exercises   Other Exercises  Bridges x 10, piriformis stretch PROM supine.                PT Education - 07/19/16 1032    Education provided Yes   Education Details add walking program   Person(s) Educated Patient   Methods Explanation   Comprehension Verbalized understanding          PT Short Term Goals - 07/05/16 1158      PT SHORT TERM GOAL #1   Title The patient will be indep with HEP for balance, LE strength and general mobility.   Baseline Target date 08/03/16   Time 4   Period Weeks     PT SHORT TERM GOAL #2   Title The patient will demonstrate stair negotiation reciprocal pattern x 8 steps with one handrail modified indep.   Baseline Target date 08/03/16   Time 4   Period Weeks           PT Long Term Goals - 07/05/16 1156      PT LONG TERM GOAL #1   Title The patient will improve ABC scale from 61.3% up to > or equal to 72% to  demo improved confidence with balance.   Baseline Target date: 08/19/2016   Time 6   Period Weeks     PT LONG TERM GOAL #2   Title The patient will improve gait speed from 2.41 ft/sec up to > or equal to 2.8 ft/sec to demo full community ambulation.   Baseline Target date 08/19/2016   Time 6  Period Weeks     PT LONG TERM GOAL #3   Title The patient will improve Berg score from 47/56 up to > or equal to 51/56 to demo improving high level balance.   Baseline Target date 08/19/2016   Time 6   Period Weeks               Plan - 07/19/16 1033    Clinical Impression Statement The patient is at fall risk per FGA score.  PT and patient went outdoors to mimic home environment (gravel driveway, hills, etc) and patient was able to safely negotiate with supervision and SPC.  Recommended he continue progressing endurance at home by adding walking program to current HEP.    PT Treatment/Interventions ADLs/Self Care Home Management;Gait training;Stair training;Functional mobility training;Patient/family education;Neuromuscular re-education;Therapeutic exercise;Therapeutic activities;Balance training   PT Next Visit Plan Begin checking STGs/LTGs, posture, hip flexor stretching, gluteal strengthening.   Consulted and Agree with Plan of Care Patient;Family member/caregiver   Family Member Consulted spouse      Patient will benefit from skilled therapeutic intervention in order to improve the following deficits and impairments:  Abnormal gait, Difficulty walking, Decreased mobility, Decreased balance  Visit Diagnosis: Other abnormalities of gait and mobility  Unsteadiness on feet     Problem List Patient Active Problem List   Diagnosis Date Noted  . GBS (Guillain Barre syndrome) ([place]) 04/14/2016  . Neuropathic pain   . Leukocytosis   . Chronic anticoagulation   . Benign essential HTN   . Stage 3 chronic kidney disease   . PAF (paroxysmal atrial fibrillation) ([place])   . History of  meningioma   . Urinary retention   . History of Guillain-Barre syndrome   . Weakness of both lower extremities   . Leg weakness 04/04/2016  . Guillain Barr syndrome ([place]) 04/04/2016  . Cellulitis of left knee 03/12/2016  . Atrial fibrillation, new onset ([place]) 03/07/2016  . Urinary tract infection without hematuria 03/07/2016  . Renal insufficiency 03/07/2016  . Hypokalemia 03/07/2016  . History of CVA (cerebrovascular accident) 03/07/2016  . HTN (hypertension) 03/07/2016  . Atrial fibrillation ([place]) 03/07/2016  . Acute urinary retention   . Occlusion and stenosis of carotid artery without mention of cerebral infarction 07/11/2012  . Carotid stenosis 07/11/2012    Noriko Macari, PT 07/19/2016, 10:36 AM  [facility] [address] [place], [place], [address] Phone: [phone]   Fax:  [phone]  Name: [name] MRN: [id number] Date of Birth: Feb 21, 1941

## 2016-07-26 ENCOUNTER — Ambulatory Visit: Payer: PPO | Admitting: Rehabilitative and Restorative Service Providers"

## 2016-07-29 ENCOUNTER — Encounter: Payer: Self-pay | Admitting: Family

## 2016-08-02 ENCOUNTER — Ambulatory Visit: Payer: PPO | Admitting: Rehabilitative and Restorative Service Providers"

## 2016-08-02 DIAGNOSIS — R2681 Unsteadiness on feet: Secondary | ICD-10-CM

## 2016-08-02 DIAGNOSIS — R2689 Other abnormalities of gait and mobility: Secondary | ICD-10-CM

## 2016-08-02 NOTE — Patient Instructions (Signed)
Functional Quadriceps: Sit to Stand    Sit on edge of chair, feet flat on floor. Stand upright, extending knees fully. Repeat __10__ times per set. Do __2__ sets per session. Do __2__ sessions per day. http://orth.exer.us/734  Copyright  VHI. All rights reserved.  Heel Raise: Bilateral (Standing)    Rise on balls of feet. Repeat _20___ times per set. Do __1__ sets per session. Do _2___ sessions per day. http://orth.exer.us/38  Copyright  VHI. All rights reserved.   Bracing With Bridging (Hook-Lying)    With neutral spine, tighten pelvic floor and abdominals and hold. Lift bottom. Repeat _10-15__ times. Do _2__ times a day.   Copyright  VHI. All rights reserved.   Thoracic Self-Mobilization (Supine)    With rolled towel placed lengthwise at lower ribs level, lie back on towel with arms outstretched. Hold __2 minutes. Relax. Repeat __1__ times per set. Do __1-2__ sessions per day.  http://orth.exer.us/1000   Copyright  VHI. All rights reserved.   Hip Flexors (Supine)    Lie with both legs bent over edge of firm surface. To stretch left hip, bring opposite knee to chest. Apply downward pressure to leg hanging over edge. Do not allow hips to roll up. Do not let knees change position. Hold __30__ seconds. Repeat __2__ times on each side. Do __1-2__ sessions per day. CAUTION: Stretch should be gentle, steady and slow.  Copyright  VHI. All rights reserved.   Hamstring Stretch, Seated (Strap, Two Chairs)    Sit with one leg extended onto facing chair. Loop strap over outstretched foot at ball of big toe. Lengthen spine. Hold for __30 seconds on each leg. Repeat __2__ times each leg.  Copyright  VHI. All rights reserved.

## 2016-08-02 NOTE — Therapy (Signed)
[facility] [address] [place], [place], [address] Phone: [phone]   Fax:  [phone]  Physical Therapy Treatment  Patient Details  Name: [name] MRN: [id number] Date of Birth: 1941-02-03 Referring Provider: Dr. Posey Pronto  Encounter Date: 08/02/2016      PT End of Session - 08/02/16 1536    Visit Number 4   Number of Visits 6   Date for PT Re-Evaluation 08/19/16   Authorization Type G code every 10th visit   PT Start Time 0935   PT Stop Time 1020   PT Time Calculation (min) 45 min   Equipment Utilized During Treatment Gait belt   Activity Tolerance Patient tolerated treatment well   Behavior During Therapy [facility] for tasks assessed/performed      Past Medical History:  Diagnosis Date  . Atrial fibrillation ([place]) 02/2016   NEW ONSET  . Brain tumor (benign) ([place]) 1998   2 small tumors- since 2015 (Dr. [name] following)  . Carotid artery occlusion   . Cerebrovascular disease September 09, 2001   TIA, Left brain  . CKD (chronic kidney disease), stage III   . Diverticulosis   . ED (erectile dysfunction)   . Guillain-Barre syndrome ([place]) 1999  . Hx of elevated lipids   . Hyperlipidemia   . Hypertension   . Memory loss   . Stroke [facility]) 2003    Past Surgical History:  Procedure Laterality Date  . BRAIN SURGERY  1998   Benign brain tumor removed, Left hemisphere- ? Meningioma  . CAROTID ENDARTERECTOMY Left September 13, 2001   LEFT cea  . CHOLECYSTECTOMY  Nov. 2001  . COLON SURGERY  Feb. 2003   Colonic polyps removed endoscopically  . COLONOSCOPY WITH PROPOFOL N/A 11/11/2015   Procedure: COLONOSCOPY WITH PROPOFOL;  Surgeon: Garlan Fair, MD;  Location: [facility];  Service: Endoscopy;  Laterality: N/A;  . INGUINAL HERNIA REPAIR  1970's    There were no vitals filed for this visit.      Subjective Assessment - 08/02/16 0938    Subjective The patient reports he is doing HEP and it is going well.   The patient missed last week due to snow.  The patient notes that balance is doing better.    He notes he is not yet cleared to drive so has not tried to get up/down onto tractor yet.    Pertinent History h/o meningioma resection 1998, CSF infection, current meningioma followed by neurosurgery for monitoring, CVA 08/2001, CKD.   Patient Stated Goals Weakness is a continued deficit.   Currently in Pain? No/denies            [facility] PT Assessment - 08/02/16 1003      Standardized Balance Assessment   Standardized Balance Assessment Berg Balance Test;Timed Up and Go Test     Berg Balance Test   Sit to Stand Able to stand without using hands and stabilize independently   Standing Unsupported Able to stand safely 2 minutes   Sitting with Back Unsupported but Feet Supported on Floor or Stool Able to sit safely and securely 2 minutes   Stand to Sit Sits safely with minimal use of hands   Transfers Able to transfer safely, minor use of hands   Standing Unsupported with Eyes Closed Able to stand 10 seconds safely   Standing Ubsupported with Feet Together Able to place feet together independently and stand 1 minute safely   From Standing, Reach Forward with Outstretched Arm Can reach  forward >12 cm safely (5")   From Standing Position, Pick up Object from [place] to pick up shoe safely and easily   From Standing Position, Turn to Look Behind Over each Shoulder Looks behind from both sides and weight shifts well   Turn 360 Degrees Able to turn 360 degrees safely in 4 seconds or less   Standing Unsupported, Alternately Place Feet on Step/Stool Able to stand independently and safely and complete 8 steps in 20 seconds   Standing Unsupported, One Foot in [place] to take small step independently and hold 30 seconds   Standing on One Leg Able to lift leg independently and hold 5-10 seconds   Total Score 52   Berg comment: 52/56     Functional Gait  Assessment   Gait assessed  Yes   Gait Level  Surface Walks 20 ft, slow speed, abnormal gait pattern, evidence for imbalance or deviates 10-15 in outside of the 12 in walkway width. Requires more than 7 sec to ambulate 20 ft.   Change in Gait Speed Able to change speed, demonstrates mild gait deviations, deviates 6-10 in outside of the 12 in walkway width, or no gait deviations, unable to achieve a major change in velocity, or uses a change in velocity, or uses an assistive device.   Gait with Horizontal Head Turns Performs head turns smoothly with slight change in gait velocity (eg, minor disruption to smooth gait path), deviates 6-10 in outside 12 in walkway width, or uses an assistive device.   Gait with Vertical Head Turns Performs head turns with no change in gait. Deviates no more than 6 in outside 12 in walkway width.   Gait and Pivot Turn Pivot turns safely within 3 sec and stops quickly with no loss of balance.   Step Over Obstacle Is able to step over one shoe box (4.5 in total height) but must slow down and adjust steps to clear box safely. May require verbal cueing.   Gait with Narrow Base of Support Ambulates less than 4 steps heel to toe or cannot perform without assistance.   Gait with Eyes Closed Walks 20 ft, slow speed, abnormal gait pattern, evidence for imbalance, deviates 10-15 in outside 12 in walkway width. Requires more than 9 sec to ambulate 20 ft.  slowed pace   Ambulating Backwards Walks 20 ft, uses assistive device, slower speed, mild gait deviations, deviates 6-10 in outside 12 in walkway width.   Steps Alternating feet, must use rail.   Total Score 17   FGA comment: 17/30 improved from 14/30.                      [facility] Adult PT Treatment/Exercise - 08/02/16 1003      Ambulation/Gait   Ambulation/Gait Yes   Ambulation/Gait Assistance 6: Modified independent (Device/Increase time)   Ambulation Surface Level   Gait velocity 2.41 ft/sec   Stairs Yes   Stairs Assistance 6: Modified independent  (Device/Increase time)   Stair Management Technique One rail Right;Step to pattern   Gait Comments Patient can use a reciprocal pattern to ascend steps, but notes knee discomfort (where he had prior cellulitis) when attempting to descend in reciprocal pattern.  Recommended step to pattern for safety.       Exercises   Exercises Other Exercises   Other Exercises  Reviewed HEP including bridges, towel stretch, sit<>stand and heel raises.  Recommended slower pace to complete.  Added new HEP of seated hamstring stretches R  and L x 20-30 seconds x 2 reps and hip flexor stretch in modified thomas test position.                  PT Education - 08/02/16 1536    Education provided Yes   Education Details Added hamstring and hip flexor stretch to current HEP   Person(s) Educated Patient;Spouse   Methods Explanation;Demonstration;Handout   Comprehension Returned demonstration;Verbalized understanding          PT Short Term Goals - 08/02/16 0952      PT SHORT TERM GOAL #1   Title The patient will be indep with HEP for balance, LE strength and general mobility.   Baseline Met on 08/02/16   Time 4   Period Weeks   Status Achieved     PT SHORT TERM GOAL #2   Title The patient will demonstrate stair negotiation reciprocal pattern x 8 steps with one handrail modified indep.   Baseline Ascends with reciprocal pattern, needs step to pattern to safely descend steps.   Time 4   Period Weeks   Status Partially Met           PT Long Term Goals - 08/02/16 9604      PT LONG TERM GOAL #1   Title The patient will improve ABC scale from 61.3% up to > or equal to 72% to demo improved confidence with balance.   Baseline Did not complete today during session.   Time 6   Period Weeks   Status Deferred     PT LONG TERM GOAL #2   Title The patient will improve gait speed from 2.41 ft/sec up to > or equal to 2.8 ft/sec to demo full community ambulation.   Baseline Remains 2.41 on 08/02/16  with spouse reporting this is his prior speed of ambulation.   Time 6   Period Weeks   Status Not Met     PT LONG TERM GOAL #3   Title The patient will improve Berg score from 47/56 up to > or equal to 51/56 to demo improving high level balance.   Baseline Met on 08/02/16 scoring 52/56.   Time 6   Period Weeks   Status Achieved               Plan - 08/02/16 1542    Clinical Impression Statement The patient met LTG for Berg.  His gait speed has remained consistent since evaluation and his spouse notes this is his typical pace (prior to hospital admission in 03/2016).  The patient reports his balance is improved and he no longer uses the cane at home or when walking outdoors around house.  He uses for longer walks in community.  The patient and his wife report they are working together to complete HEP daily and he is walking often around the house for exercise.  He has good static balance per Merrilee Jansky balance test (52/56) and continues with lower score on functional gait assessment (17/30)--however score is lowered due to his slower pace (which appears pre-morbid).  PT did not schedule further visits.  Patient to f/u with neurosurgeon in 08/2016 to determine tx for meningioma growth.  Patient/wife to call if any questions regarding HEP arise.    PT Treatment/Interventions ADLs/Self Care Home Management;Gait training;Stair training;Functional mobility training;Patient/family education;Neuromuscular re-education;Therapeutic exercise;Therapeutic activities;Balance training   PT Next Visit Plan On hold until physician f/u-- patient to call if questions regarding HEP   Consulted and Agree with Plan of Care Patient  Patient will benefit from skilled therapeutic intervention in order to improve the following deficits and impairments:  Abnormal gait, Difficulty walking, Decreased mobility, Decreased balance  Visit Diagnosis: Other abnormalities of gait and mobility  Unsteadiness on  feet     Problem List Patient Active Problem List   Diagnosis Date Noted  . GBS (Guillain Barre syndrome) ([place]) 04/14/2016  . Neuropathic pain   . Leukocytosis   . Chronic anticoagulation   . Benign essential HTN   . Stage 3 chronic kidney disease   . PAF (paroxysmal atrial fibrillation) ([place])   . History of meningioma   . Urinary retention   . History of Guillain-Barre syndrome   . Weakness of both lower extremities   . Leg weakness 04/04/2016  . Guillain Barr syndrome ([place]) 04/04/2016  . Cellulitis of left knee 03/12/2016  . Atrial fibrillation, new onset ([place]) 03/07/2016  . Urinary tract infection without hematuria 03/07/2016  . Renal insufficiency 03/07/2016  . Hypokalemia 03/07/2016  . History of CVA (cerebrovascular accident) 03/07/2016  . HTN (hypertension) 03/07/2016  . Atrial fibrillation ([place]) 03/07/2016  . Acute urinary retention   . Occlusion and stenosis of carotid artery without mention of cerebral infarction 07/11/2012  . Carotid stenosis 07/11/2012    Chalsey Leeth, PT 08/02/2016, 3:47 PM  [facility] [address] [place], [place], [address] Phone: [phone]   Fax:  [phone]  Name: Flora Ratz MRN: [id number] Date of Birth: March 11, 1941

## 2016-08-06 ENCOUNTER — Encounter: Payer: Self-pay | Admitting: Physical Medicine & Rehabilitation

## 2016-08-06 ENCOUNTER — Encounter: Payer: PPO | Attending: Physical Medicine & Rehabilitation | Admitting: Physical Medicine & Rehabilitation

## 2016-08-06 VITALS — BP 161/84 | HR 67

## 2016-08-06 DIAGNOSIS — Z87891 Personal history of nicotine dependence: Secondary | ICD-10-CM | POA: Diagnosis not present

## 2016-08-06 DIAGNOSIS — G650 Sequelae of Guillain-Barre syndrome: Secondary | ICD-10-CM | POA: Insufficient documentation

## 2016-08-06 DIAGNOSIS — N183 Chronic kidney disease, stage 3 (moderate): Secondary | ICD-10-CM | POA: Diagnosis not present

## 2016-08-06 DIAGNOSIS — R339 Retention of urine, unspecified: Secondary | ICD-10-CM

## 2016-08-06 DIAGNOSIS — Z823 Family history of stroke: Secondary | ICD-10-CM | POA: Insufficient documentation

## 2016-08-06 DIAGNOSIS — L03116 Cellulitis of left lower limb: Secondary | ICD-10-CM | POA: Insufficient documentation

## 2016-08-06 DIAGNOSIS — R531 Weakness: Secondary | ICD-10-CM | POA: Insufficient documentation

## 2016-08-06 DIAGNOSIS — Z8673 Personal history of transient ischemic attack (TIA), and cerebral infarction without residual deficits: Secondary | ICD-10-CM | POA: Insufficient documentation

## 2016-08-06 DIAGNOSIS — E785 Hyperlipidemia, unspecified: Secondary | ICD-10-CM | POA: Insufficient documentation

## 2016-08-06 DIAGNOSIS — Z9889 Other specified postprocedural states: Secondary | ICD-10-CM | POA: Insufficient documentation

## 2016-08-06 DIAGNOSIS — Z833 Family history of diabetes mellitus: Secondary | ICD-10-CM | POA: Insufficient documentation

## 2016-08-06 DIAGNOSIS — I129 Hypertensive chronic kidney disease with stage 1 through stage 4 chronic kidney disease, or unspecified chronic kidney disease: Secondary | ICD-10-CM | POA: Diagnosis not present

## 2016-08-06 DIAGNOSIS — I480 Paroxysmal atrial fibrillation: Secondary | ICD-10-CM | POA: Insufficient documentation

## 2016-08-06 DIAGNOSIS — M792 Neuralgia and neuritis, unspecified: Secondary | ICD-10-CM

## 2016-08-06 DIAGNOSIS — Z9049 Acquired absence of other specified parts of digestive tract: Secondary | ICD-10-CM | POA: Diagnosis not present

## 2016-08-06 DIAGNOSIS — R269 Unspecified abnormalities of gait and mobility: Secondary | ICD-10-CM | POA: Diagnosis not present

## 2016-08-06 DIAGNOSIS — D329 Benign neoplasm of meninges, unspecified: Secondary | ICD-10-CM

## 2016-08-06 DIAGNOSIS — Z800 Family history of malignant neoplasm of digestive organs: Secondary | ICD-10-CM | POA: Diagnosis not present

## 2016-08-06 DIAGNOSIS — I10 Essential (primary) hypertension: Secondary | ICD-10-CM | POA: Diagnosis not present

## 2016-08-06 DIAGNOSIS — G610 Guillain-Barre syndrome: Secondary | ICD-10-CM | POA: Diagnosis not present

## 2016-08-06 NOTE — Progress Notes (Signed)
Subjective:    Patient ID: [name], male    DOB: [birth year], 76 y.o.   MRN: [id number]  HPI 76 y.o. male with history of HTN, CKD, recent diagnosis of PAF, CVA, crani for meningioma resection, GBS presents for follow up for GBS.    Last clinic visit 06/25/16.  Presents with wife, who provides majority of the history.  Since that he completed PT and is doing HEP. His BP remains elevated, but wife states it has been controlled for the most part. He saw Neurosurg and plans for surgery in the near future.  His urinary retention has resolved, but he has an appointment with Urology in the future.  He uses a cane in the community. Denies falls.   Pain Inventory Average Pain 0 Pain Right Now 0 My pain is na  In the last 24 hours, has pain interfered with the following? General activity 0 Relation with others 0 Enjoyment of life 0 What TIME of day is your pain at its worst? na Sleep (in general) Good  Pain is worse with: na Pain improves with: na Relief from Meds: na  Mobility use a cane use a walker ability to climb steps?  yes do you drive?  no Do you have any goals in this area?  yes  Function retired I need assistance with the following:  dressing and bathing  Neuro/Psych weakness trouble walking  Prior Studies Any changes since last visit?  no  Physicians involved in your care Any changes since last visit?  no   Family History  Problem Relation Age of Onset  . Colon cancer Mother     Metastatic   . Cancer Mother     Colon  . Stroke Father   . Diabetes Father    Social History   Social History  . Marital status: Married    Spouse name: N/A  . Number of children: N/A  . Years of education: N/A   Social History Main Topics  . Smoking status: Former Smoker    Types: Cigarettes    Quit date: 05/17/1996  . Smokeless tobacco: Never Used  . Alcohol use 0.6 oz/week    1 Cans of beer per week     Comment: every few months  . Drug use: No  . Sexual  activity: Not on file   Other Topics Concern  . Not on file   Social History Narrative  . No narrative on file   Past Surgical History:  Procedure Laterality Date  . BRAIN SURGERY  1998   Benign brain tumor removed, Left hemisphere- ? Meningioma  . CAROTID ENDARTERECTOMY Left September 13, 2001   LEFT cea  . CHOLECYSTECTOMY  Nov. 2001  . COLON SURGERY  Feb. 2003   Colonic polyps removed endoscopically  . COLONOSCOPY WITH PROPOFOL N/A 11/11/2015   Procedure: COLONOSCOPY WITH PROPOFOL;  Surgeon: Garlan Fair, MD;  Location: [facility];  Service: Endoscopy;  Laterality: N/A;  . INGUINAL HERNIA REPAIR  1970's   Past Medical History:  Diagnosis Date  . Atrial fibrillation ([place]) 02/2016   NEW ONSET  . Brain tumor (benign) ([place]) 1998   2 small tumors- since 2015 (Dr. [name] following)  . Carotid artery occlusion   . Cerebrovascular disease September 09, 2001   TIA, Left brain  . CKD (chronic kidney disease), stage III   . Diverticulosis   . ED (erectile dysfunction)   . Guillain-Barre syndrome ([place]) 1999  . Hx of elevated lipids   .  Hyperlipidemia   . Hypertension   . Memory loss   . Stroke [facility]) 2003   There were no vitals taken for this visit.  Opioid Risk Score:   Fall Risk Score:  `1  Depression screen PHQ 2/9  Depression screen PHQ 2/9 05/13/2016  Decreased Interest 0  Down, Depressed, Hopeless 0  PHQ - 2 Score 0  Altered sleeping 0  Tired, decreased energy 0  Change in appetite 0  Feeling bad or failure about yourself  0  Trouble concentrating 0  Moving slowly or fidgety/restless 0  Suicidal thoughts 0  PHQ-9 Score 0  Difficult doing work/chores Not difficult at all   Review of Systems  Constitutional: Negative.   HENT: Negative.   Eyes: Negative.   Respiratory: Negative.   Cardiovascular: Negative.   Gastrointestinal: Negative.   Endocrine: Negative.   Genitourinary: Negative.   Musculoskeletal: Positive for gait problem.  Skin: Negative.     Allergic/Immunologic: Negative.   Hematological: Negative.   Psychiatric/Behavioral: Negative.   All other systems reviewed and are negative.     Objective:   Physical Exam Constitutional: Well-developed and well-nourished.  NAD.  HENT: Normocephalic, atraumatic.  Eyes: EOMI. No discharge.  Cardiovascular: RRR. No JVD. Respiratory: cta bilaterally. Unlabored. GI: Soft. Bowel sounds are normal.   Musculoskeletal: no edema, no tenderness. Neurological: He is alert and oriented.   Sensory intact to light touch Motor: Motor B/l UE 5/5 proximal to distal RLE: 5/5 proximal to distal LLE: HF, KE 5/5 ADF/PF 5/5 Skin: skin intact. Warm and dry.  Psychiatric: pleasant and appropriate. Normal mood.    Assessment & Plan:  76 y.o. male with history of HTN, CKD, recent diagnosis of PAF, CVA, crani for meningioma resection, GBS presents for follow up for GBS.    1. [place] GBS, with history of GBS  Completed PT, cont HEP  Pt was told by PCP he does not need follow up with Neurology  Pt would like permission for driving, due to increased size of tumor and recent therapy notes, do not recommend driving at this time.   2. Pain Management  Controlled off medications  3.  HTN with Orthostasis  Cont to follow with PCP.  Does not need TED per PCP.  Elevated today, however, wife states, usually relatively controlled  4. Recurrent meningiomas  Follow Neurosurgery, plans for potential surgery in near future  5. Urinary retention:  Meds being adjusted by Urology, pt to have follow up appointment now in May, although symptoms appear to be improving  6. Neurologic gait disorder  Cont HEP  Cont cane for safety  >25 minutes spent with patient, with >20 counseling regarding therapies, brain tumor, need for therapies, driving

## 2016-08-09 ENCOUNTER — Ambulatory Visit: Payer: PPO | Admitting: Rehabilitative and Restorative Service Providers"

## 2016-08-10 ENCOUNTER — Encounter: Payer: Self-pay | Admitting: Family

## 2016-08-10 ENCOUNTER — Ambulatory Visit (HOSPITAL_COMMUNITY)
Admission: RE | Admit: 2016-08-10 | Discharge: 2016-08-10 | Disposition: A | Discharge status: Home / Self Care | Payer: PPO | Source: Ambulatory Visit | Attending: Vascular Surgery | Admitting: Vascular Surgery

## 2016-08-10 ENCOUNTER — Ambulatory Visit (INDEPENDENT_AMBULATORY_CARE_PROVIDER_SITE_OTHER): Payer: PPO | Admitting: Family

## 2016-08-10 ENCOUNTER — Ambulatory Visit (INDEPENDENT_AMBULATORY_CARE_PROVIDER_SITE_OTHER)
Admission: RE | Admit: 2016-08-10 | Discharge: 2016-08-10 | Disposition: A | Discharge status: Home / Self Care | Payer: PPO | Source: Ambulatory Visit | Attending: Vascular Surgery | Admitting: Vascular Surgery

## 2016-08-10 VITALS — BP 123/78 | HR 57 | Temp 96.8°F | Resp 16 | Ht 71.0 in | Wt 219.0 lb

## 2016-08-10 DIAGNOSIS — I6522 Occlusion and stenosis of left carotid artery: Secondary | ICD-10-CM | POA: Diagnosis not present

## 2016-08-10 DIAGNOSIS — Z7901 Long term (current) use of anticoagulants: Secondary | ICD-10-CM | POA: Diagnosis not present

## 2016-08-10 DIAGNOSIS — R0989 Other specified symptoms and signs involving the circulatory and respiratory systems: Secondary | ICD-10-CM | POA: Diagnosis not present

## 2016-08-10 DIAGNOSIS — Z9889 Other specified postprocedural states: Secondary | ICD-10-CM | POA: Diagnosis not present

## 2016-08-10 DIAGNOSIS — Z48812 Encounter for surgical aftercare following surgery on the circulatory system: Secondary | ICD-10-CM | POA: Diagnosis not present

## 2016-08-10 DIAGNOSIS — I6521 Occlusion and stenosis of right carotid artery: Secondary | ICD-10-CM | POA: Insufficient documentation

## 2016-08-10 DIAGNOSIS — Z87891 Personal history of nicotine dependence: Secondary | ICD-10-CM

## 2016-08-10 DIAGNOSIS — I779 Disorder of arteries and arterioles, unspecified: Secondary | ICD-10-CM

## 2016-08-10 NOTE — Patient Instructions (Signed)
Stroke Prevention Some medical conditions and behaviors are associated with an increased chance of having a stroke. You may prevent a stroke by making healthy choices and managing medical conditions. How can I reduce my risk of having a stroke?  Stay physically active. Get at least 30 minutes of activity on most or all days.  Do not smoke. It may also be helpful to avoid exposure to secondhand smoke.  Limit alcohol use. Moderate alcohol use is considered to be:  No more than 2 drinks per day for men.  No more than 1 drink per day for nonpregnant women.  Eat healthy foods. This involves:  Eating 5 or more servings of fruits and vegetables a day.  Making dietary changes that address high blood pressure (hypertension), high cholesterol, diabetes, or obesity.  Manage your cholesterol levels.  Making food choices that are high in fiber and low in saturated fat, trans fat, and cholesterol may control cholesterol levels.  Take any prescribed medicines to control cholesterol as directed by your health care provider.  Manage your diabetes.  Controlling your carbohydrate and sugar intake is recommended to manage diabetes.  Take any prescribed medicines to control diabetes as directed by your health care provider.  Control your hypertension.  Making food choices that are low in salt (sodium), saturated fat, trans fat, and cholesterol is recommended to manage hypertension.  Ask your health care provider if you need treatment to lower your blood pressure. Take any prescribed medicines to control hypertension as directed by your health care provider.  If you are 18-39 years of age, have your blood pressure checked every 3-5 years. If you are 40 years of age or older, have your blood pressure checked every year.  Maintain a healthy weight.  Reducing calorie intake and making food choices that are low in sodium, saturated fat, trans fat, and cholesterol are recommended to manage  weight.  Stop drug abuse.  Avoid taking birth control pills.  Talk to your health care provider about the risks of taking birth control pills if you are over 35 years old, smoke, get migraines, or have ever had a blood clot.  Get evaluated for sleep disorders (sleep apnea).  Talk to your health care provider about getting a sleep evaluation if you snore a lot or have excessive sleepiness.  Take medicines only as directed by your health care provider.  For some people, aspirin or blood thinners (anticoagulants) are helpful in reducing the risk of forming abnormal blood clots that can lead to stroke. If you have the irregular heart rhythm of atrial fibrillation, you should be on a blood thinner unless there is a good reason you cannot take them.  Understand all your medicine instructions.  Make sure that other conditions (such as anemia or atherosclerosis) are addressed. Get help right away if:  You have sudden weakness or numbness of the face, arm, or leg, especially on one side of the body.  Your face or eyelid droops to one side.  You have sudden confusion.  You have trouble speaking (aphasia) or understanding.  You have sudden trouble seeing in one or both eyes.  You have sudden trouble walking.  You have dizziness.  You have a loss of balance or coordination.  You have a sudden, severe headache with no known cause.  You have new chest pain or an irregular heartbeat. Any of these symptoms may represent a serious problem that is an emergency. Do not wait to see if the symptoms will go away.   Get medical help at once. Call your local emergency services (911 in [place] Do not drive yourself to the hospital. This information is not intended to replace advice given to you by your health care provider. Make sure you discuss any questions you have with your health care provider. Document Released: 06/10/2004 Document Revised: 10/09/2015 Document Reviewed: 11/03/2012 Elsevier  Interactive Patient Education  2017 Elsevier Inc.     Preventing Cerebrovascular Disease Arteries are blood vessels that carry blood that contains oxygen from the heart to all parts of the body. Cerebrovascular disease affects arteries that supply the brain. Any condition that blocks or disrupts blood flow to the brain can cause cerebrovascular disease. Brain cells that lose blood supply start to die within minutes (stroke). Stroke is the main danger of cerebrovascular disease. Atherosclerosis and high blood pressure are common causes of cerebrovascular disease. Atherosclerosis is narrowing and hardening of an artery that results when fat, cholesterol, calcium, or other substances (plaque) build up inside an artery. Plaque reduces blood flow through the artery. High blood pressure increases the risk of bleeding inside the brain. Making diet and lifestyle changes to prevent atherosclerosis and high blood pressure lowers your risk of cerebrovascular disease. What nutrition changes can be made?  Eat more fruits, vegetables, and whole grains.  Reduce how much saturated fat you eat. To do this, eat less red meat and fewer full-fat dairy products.  Eat healthy proteins instead of red meat. Healthy proteins include:  Fish. Eat fish that contains heart-healthy omega-3 fatty acids, twice a week. Examples include salmon, albacore tuna, mackerel, and herring.  Chicken.  Nuts.  Low-fat or nonfat yogurt.  Avoid processed meats, like bacon and lunchmeat.  Avoid foods that contain:  A lot of sugar, such as sweets and drinks with added sugar.  A lot of salt (sodium). Avoid adding extra salt to your food, as told by your health care provider.  Trans fats, such as margarine and baked goods. Trans fats may be listed as "partially hydrogenated oils" on food labels.  Check food labels to see how much sodium, sugar, and trans fats are in foods.  Use vegetable oils that contain low amounts of  saturated fat, such as olive oil or canola oil. What lifestyle changes can be made?  Drink alcohol in moderation. This means no more than 1 drink a day for nonpregnant women and 2 drinks a day for men. One drink equals 12 oz of beer, 5 oz of wine, or 1 oz of hard liquor.  If you are overweight, ask your health care provider to recommend a weight-loss plan for you. Losing 5-10 lb (2.2-4.5 kg) can reduce your risk of diabetes, atherosclerosis, and high blood pressure.  Exercise for 30?60 minutes on most days, or as much as told by your health care provider.  Do moderate-intensity exercise, such as brisk walking, bicycling, and water aerobics. Ask your health care provider which activities are safe for you.  Do not use any products that contain nicotine or tobacco, such as cigarettes and e-cigarettes. If you need help quitting, ask your health care provider. Why are these changes important? Making these changes lowers your risk of many diseases that can cause cerebrovascular disease and stroke. Stroke is a leading cause of death and disability. Making these changes also improves your overall health and quality of life. What can I do to lower my risk? The following factors make you more likely to develop cerebrovascular disease:  Being overweight.  Smoking.  Being physically inactive.    Eating a high-fat diet.  Having certain health conditions, such as:  Diabetes.  High blood pressure.  Heart disease.  Atherosclerosis.  High cholesterol.  Sickle cell disease. Talk with your health care provider about your risk for cerebrovascular disease. Work with your health care provider to control diseases that you have that may contribute to cerebrovascular disease. Your health care provider may prescribe medicines to help prevent major causes of cerebrovascular disease. Where to find more information: Learn more about preventing cerebrovascular disease from:  [place], Lung, and  [place]: MoAnalyst.de  Centers for Disease Control and Prevention: http://www.cur[place].biz/ Summary  Cerebrovascular disease can lead to a stroke.  Atherosclerosis and high blood pressure are major causes of cerebrovascular disease.  Making diet and lifestyle changes can reduce your risk of cerebrovascular disease.  Work with your health care provider to get your risk factors under control to reduce your risk of cerebrovascular disease. This information is not intended to replace advice given to you by your health care provider. Make sure you discuss any questions you have with your health care provider. Document Released: 05/18/2015 Document Revised: 11/21/2015 Document Reviewed: 05/18/2015 Elsevier Interactive Patient Education  2017 [place].      Peripheral Vascular Disease Peripheral vascular disease (PVD) is a disease of the blood vessels that are not part of your heart and brain. A simple term for PVD is poor circulation. In most cases, PVD narrows the blood vessels that carry blood from your heart to the rest of your body. This can result in a decreased supply of blood to your arms, legs, and internal organs, like your stomach or kidneys. However, it most often affects a person's lower legs and feet. There are two types of PVD.  Organic PVD. This is the more common type. It is caused by damage to the structure of blood vessels.  Functional PVD. This is caused by conditions that make blood vessels contract and tighten (spasm). Without treatment, PVD tends to get worse over time. PVD can also lead to acute ischemic limb. This is when an arm or limb suddenly has trouble getting enough blood. This is a medical emergency. Follow these instructions at home:  Take medicines only as told by your doctor.  Do not use any tobacco products, including cigarettes, chewing tobacco, or electronic cigarettes. If you need help quitting, ask  your doctor.  Lose weight if you are overweight, and maintain a healthy weight as told by your doctor.  Eat a diet that is low in fat and cholesterol. If you need help, ask your doctor.  Exercise regularly. Ask your doctor for some good activities for you.  Take good care of your feet.  Wear comfortable shoes that fit well.  Check your feet often for any cuts or sores. Contact a doctor if:  You have cramps in your legs while walking.  You have leg pain when you are at rest.  You have coldness in a leg or foot.  Your skin changes.  You are unable to get or have an erection (erectile dysfunction).  You have cuts or sores on your feet that are not healing. Get help right away if:  Your arm or leg turns cold and blue.  Your arms or legs become red, warm, swollen, painful, or numb.  You have chest pain or trouble breathing.  You suddenly have weakness in your face, arm, or leg.  You become very confused or you cannot speak.  You suddenly have a very bad headache.  You suddenly cannot see. This information is not intended to replace advice given to you by your health care provider. Make sure you discuss any questions you have with your health care provider. Document Released: 07/28/2009 Document Revised: 10/09/2015 Document Reviewed: 10/11/2013 Elsevier Interactive Patient Education  2017 [employer].

## 2016-08-10 NOTE — Progress Notes (Signed)
[facility] [place] HISTORY AND PHYSICAL   MRN : [id number]  History of Present Illness:   [name] is a 76 y.o. male patient of Dr. Donnetta Hutching with a known right ICA occlusion and a history of left CEA in April 2003.  He had a preoperative TIA in 2003 as manifested by slurred speech which resolved in a few hours; denies hemiparesis, denies vision changes.  He had one large benign brain tumor removed in 1998. 2 more small brain tumors were found, not malignant, monitored by Dr. [name], neurosurgeon, one is growing and will need surgery according to wife.   Pt denies dizziness, denies tingling, numbness, aching, or weakness in either hand/arm.  He denies claudication symptoms, denies non healing wounds.   He had an exacerbation of GB sx's in November to December 2017; prior to this, in October 2017, he was hospitalized at [facility] with pyelonephritis, and atrial fib. He then developed cellulitis in his left knee and was re hospitalized for this.   He is continuing the exercises that physical therapy advised.   Pt Diabetic: No Pt smoker: former smoker, quit in 1998  Pt meds include: Statin : No, stopped due to tingling in fingers as part of his GB exacerbation; will be advised by his PCP re resumption of this ASA: No Other anticoagulants/antiplatelets: Warfarin started by his cardiologist for atrial fib, Plavix stopped    Current Outpatient Prescriptions  Medication Sig Dispense Refill  . bethanechol (URECHOLINE) 25 MG tablet     . cholecalciferol (VITAMIN D) 1000 units tablet Take 1,000 Units by mouth daily.    . tamsulosin (FLOMAX) 0.4 MG CAPS capsule     . vitamin B-12 (CYANOCOBALAMIN) 1000 MCG tablet Take 1,000 mcg by mouth 2 (two) times a week. Wednesday and Saturday    . warfarin (COUMADIN) 5 MG tablet Take 1 tablet (5 mg total) by mouth daily at 6 PM. 30 tablet 0   No current facility-administered medications for this visit.     Past Medical  History:  Diagnosis Date  . Atrial fibrillation ([place]) 02/2016   NEW ONSET  . Brain tumor (benign) ([place]) 1998   2 small tumors- since 2015 (Dr. [name] following)  . Carotid artery occlusion   . Cerebrovascular disease September 09, 2001   TIA, Left brain  . CKD (chronic kidney disease), stage III   . Diverticulosis   . ED (erectile dysfunction)   . Guillain-Barre syndrome ([place]) 1999  . Hx of elevated lipids   . Hyperlipidemia   . Hypertension   . Memory loss   . Stroke [facility]) 2003    Social History Social History  Substance Use Topics  . Smoking status: Former Smoker    Types: Cigarettes    Quit date: 05/17/1996  . Smokeless tobacco: Never Used  . Alcohol use 0.6 oz/week    1 Cans of beer per week     Comment: every few months    Family History Family History  Problem Relation Age of Onset  . Colon cancer Mother     Metastatic   . Cancer Mother     Colon  . Stroke Father   . Diabetes Father     Surgical History Past Surgical History:  Procedure Laterality Date  . BRAIN SURGERY  1998   Benign brain tumor removed, Left hemisphere- ? Meningioma  . CAROTID ENDARTERECTOMY Left September 13, 2001   LEFT cea  . CHOLECYSTECTOMY  Nov. 2001  . COLON SURGERY  Feb.  2003   Colonic polyps removed endoscopically  . COLONOSCOPY WITH PROPOFOL N/A 11/11/2015   Procedure: COLONOSCOPY WITH PROPOFOL;  Surgeon: Garlan Fair, MD;  Location: [facility];  Service: Endoscopy;  Laterality: N/A;  . INGUINAL HERNIA REPAIR  1970's    Allergies  Allergen Reactions  . Immune Globulins Other (See Comments)    Tetanus Immunes Globulins    (  Pt. Cannot take the Flu shot ) Guillian Barre   . Influenza Vaccines Other (See Comments)    Guillian Barre  . Asa [Aspirin] Other (See Comments)    Severe skin bruising     Current Outpatient Prescriptions  Medication Sig Dispense Refill  . bethanechol (URECHOLINE) 25 MG tablet     . cholecalciferol (VITAMIN D) 1000 units tablet Take 1,000 Units by  mouth daily.    . tamsulosin (FLOMAX) 0.4 MG CAPS capsule     . vitamin B-12 (CYANOCOBALAMIN) 1000 MCG tablet Take 1,000 mcg by mouth 2 (two) times a week. Wednesday and Saturday    . warfarin (COUMADIN) 5 MG tablet Take 1 tablet (5 mg total) by mouth daily at 6 PM. 30 tablet 0   No current facility-administered medications for this visit.      REVIEW OF SYSTEMS: See HPI for pertinent positives and negatives.  Physical Examination Vitals:   08/10/16 1051 08/10/16 1102  BP: 114/70 123/78  Pulse: (!) 57   Resp: 16   Temp: (!) 96.8 F (36 C)   TempSrc: Oral   SpO2: 97%   Weight: 219 lb (99.3 kg)   Height: 5\' 11"  (1.803 m)    Body mass index is 30.54 kg/m.  General:  WDWN obese male in NAD GAIT: normal Eyes: PERRLA Pulmonary: Respirations are non-labored, CTAB, no rales, rhonchi, or wheezing.  Cardiac: regular rhythm, no detected murmur.  VASCULAR EXAM Carotid Bruits Right Left   Negative negative   Aorta is not palpable. Radial pulses are 2+ palpable and equal.      LE Pulses Right Left       FEMORAL 1+ palpable  2+ palpable   POPLITEAL not palpable  not palpable   POSTERIOR TIBIAL not palpable  not palpable    DORSALIS PEDIS  ANTERIOR TIBIAL not palpable  1+ palpable     Gastrointestinal: soft, nontender, BS WNL, no r/g, asymptomatic reducible ventral hernia.  Musculoskeletal: No muscle atrophy/wasting. M/S 5/5 throughout, Extremities without ischemic changes.  Neurologic: A&O X 3; Appropriate Affect;  Speech is normal, CN 2-12 intact, Pain and light touch intact in extremities, Motor exam as listed above     ASSESSMENT:  [name] is a 76 y.o. male with a known right ICA occlusion and a history of left CEA in April 2003.  He had a TIA in 2003 before the CEA as  manifested by slurred speech which resolved in a few hours; denies hemiparesis, denies vision changes, no subsequent stroke or TIA.  His atherosclerotic risk factors include obesity and former smoking.  DATA(08/10/16):  Carotid Duplex suggests left CEA site with 1-39% stenosis, >50% stenosis of left distal CCA/proximal patch stenosis. Known occlusion of the right ICA.  Bilateral vertebral artery flow is antegrade.  Bilateral subclavian artery waveforms are normal.  No significant change compared to the previous exam of 08-05-15.  ABI: Right: 0.91, waveforms: biphasic; TBI: 0.82 Left: 0.89, waveforms: PT: monophasic, DP: biphasic; TBI: 0.79 No previous ABI's at this facility for comparison. Mild bilateral arterial occlusive disease of the lower extremities.    Plan:  Continued daily exercises advised by physical therapy.  Follow-up in 1 year with Carotid Duplex scan and ABI's. I advised pt to return sooner for concerns re the circulation in his feet/legs.   I discussed in depth with the patient the nature of atherosclerosis, and emphasized the importance of maximal medical management including strict control of blood pressure, blood glucose, and lipid levels, obtaining regular exercise, and cessation of smoking.  The patient is aware that without maximal medical management the underlying atherosclerotic disease process will progress, limiting the benefit of any interventions.  The patient was given information about stroke prevention and what symptoms should prompt the patient to seek immediate medical care.  The patient was given information about PAD including signs, symptoms, treatment, what symptoms should prompt the patient to seek immediate medical care, and risk reduction measures to take. Thank you for allowing [place] to participate in this patient's care.  Clemon Chambers, RN, MSN, FNP-C [facility] Office: [phone]  Clinic MD: Early 08/10/2016 11:12  AM

## 2016-08-11 NOTE — Addendum Note (Signed)
Addended by: Lianne Cure A on: 08/11/2016 03:38 PM   Modules accepted: Orders

## 2016-08-31 DIAGNOSIS — E782 Mixed hyperlipidemia: Secondary | ICD-10-CM | POA: Diagnosis not present

## 2016-08-31 DIAGNOSIS — G610 Guillain-Barre syndrome: Secondary | ICD-10-CM | POA: Diagnosis not present

## 2016-08-31 DIAGNOSIS — Z7901 Long term (current) use of anticoagulants: Secondary | ICD-10-CM | POA: Diagnosis not present

## 2016-08-31 DIAGNOSIS — N183 Chronic kidney disease, stage 3 (moderate): Secondary | ICD-10-CM | POA: Diagnosis not present

## 2016-08-31 DIAGNOSIS — R339 Retention of urine, unspecified: Secondary | ICD-10-CM | POA: Diagnosis not present

## 2016-08-31 DIAGNOSIS — D320 Benign neoplasm of cerebral meninges: Secondary | ICD-10-CM | POA: Diagnosis not present

## 2016-08-31 DIAGNOSIS — I129 Hypertensive chronic kidney disease with stage 1 through stage 4 chronic kidney disease, or unspecified chronic kidney disease: Secondary | ICD-10-CM | POA: Diagnosis not present

## 2016-09-03 ENCOUNTER — Encounter: Payer: Self-pay | Admitting: Rehabilitative and Restorative Service Providers"

## 2016-09-03 NOTE — Therapy (Signed)
[facility] [address] [place], [place], [address] Phone: [phone]   Fax:  [phone]  Patient Details  Name: [name] MRN: [id number] Date of Birth: Dec 25, 1940 Referring Provider:  No ref. provider found  Encounter Date: last encounter 08/02/16  PHYSICAL THERAPY DISCHARGE SUMMARY  Visits from Start of Care: 4  Current functional level related to goals / functional outcomes:     PT Short Term Goals - 08/02/16 7409      PT SHORT TERM GOAL #1   Title The patient will be indep with HEP for balance, LE strength and general mobility.   Baseline Met on 08/02/16   Time 4   Period Weeks   Status Achieved     PT SHORT TERM GOAL #2   Title The patient will demonstrate stair negotiation reciprocal pattern x 8 steps with one handrail modified indep.   Baseline Ascends with reciprocal pattern, needs step to pattern to safely descend steps.   Time 4   Period Weeks   Status Partially Met         PT Long Term Goals - 08/02/16 9278      PT LONG TERM GOAL #1   Title The patient will improve ABC scale from 61.3% up to > or equal to 72% to demo improved confidence with balance.   Baseline Did not complete today during session.   Time 6   Period Weeks   Status Deferred     PT LONG TERM GOAL #2   Title The patient will improve gait speed from 2.41 ft/sec up to > or equal to 2.8 ft/sec to demo full community ambulation.   Baseline Remains 2.41 on 08/02/16 with spouse reporting this is his prior speed of ambulation.   Time 6   Period Weeks   Status Not Met     PT LONG TERM GOAL #3   Title The patient will improve Berg score from 47/56 up to > or equal to 51/56 to demo improving high level balance.   Baseline Met on 08/02/16 scoring 52/56.   Time 6   Period Weeks   Status Achieved        Remaining deficits: Slowed gait speed Dec'd high level balance *still has f/u with neurosurgery to be completed    Education / Equipment: HEP, patient/family education, home safety.  Plan: Patient agrees to discharge.  Patient goals were partially met. Patient is being discharged due to meeting the stated rehab goals.  ?????          Thank you for the referral of this patient. Rudell Cobb, MPT   Secret Kristensen 09/03/2016, 8:51 AM  [facility] [address] [place], [place], [address] Phone: [phone]   Fax:  [phone]

## 2016-09-06 DIAGNOSIS — D429 Neoplasm of uncertain behavior of meninges, unspecified: Secondary | ICD-10-CM | POA: Diagnosis not present

## 2016-09-15 DIAGNOSIS — R3914 Feeling of incomplete bladder emptying: Secondary | ICD-10-CM | POA: Diagnosis not present

## 2016-09-15 DIAGNOSIS — R3912 Poor urinary stream: Secondary | ICD-10-CM | POA: Diagnosis not present

## 2016-09-15 DIAGNOSIS — R351 Nocturia: Secondary | ICD-10-CM | POA: Diagnosis not present

## 2016-09-28 DIAGNOSIS — E782 Mixed hyperlipidemia: Secondary | ICD-10-CM | POA: Diagnosis not present

## 2016-09-28 DIAGNOSIS — Z7901 Long term (current) use of anticoagulants: Secondary | ICD-10-CM | POA: Diagnosis not present

## 2016-10-13 DIAGNOSIS — D429 Neoplasm of uncertain behavior of meninges, unspecified: Secondary | ICD-10-CM | POA: Diagnosis not present

## 2016-10-14 DIAGNOSIS — D329 Benign neoplasm of meninges, unspecified: Secondary | ICD-10-CM | POA: Diagnosis not present

## 2016-10-14 DIAGNOSIS — D429 Neoplasm of uncertain behavior of meninges, unspecified: Secondary | ICD-10-CM | POA: Diagnosis not present

## 2016-10-18 DIAGNOSIS — Z6832 Body mass index (BMI) 32.0-32.9, adult: Secondary | ICD-10-CM | POA: Diagnosis not present

## 2016-10-18 DIAGNOSIS — D420 Neoplasm of uncertain behavior of cerebral meninges: Secondary | ICD-10-CM | POA: Diagnosis not present

## 2016-10-26 DIAGNOSIS — Z7901 Long term (current) use of anticoagulants: Secondary | ICD-10-CM | POA: Diagnosis not present

## 2016-10-28 DIAGNOSIS — Z7901 Long term (current) use of anticoagulants: Secondary | ICD-10-CM | POA: Diagnosis not present

## 2016-11-04 ENCOUNTER — Ambulatory Visit: Payer: PPO | Admitting: Physical Medicine & Rehabilitation

## 2016-11-05 DIAGNOSIS — Z7901 Long term (current) use of anticoagulants: Secondary | ICD-10-CM | POA: Diagnosis not present

## 2016-11-08 DIAGNOSIS — D420 Neoplasm of uncertain behavior of cerebral meninges: Secondary | ICD-10-CM | POA: Diagnosis not present

## 2016-11-22 ENCOUNTER — Encounter: Payer: Self-pay | Admitting: Radiation Oncology

## 2016-11-22 NOTE — Progress Notes (Signed)
Location/Histology of Brain Tumor: Right parietal meningioma  Patient presented with symptoms of:    Past or anticipated interventions, if any, per neurosurgery: Dr. [name], JR,[place],MD  Past or anticipated interventions, if any, per medical oncology: None  Dose of Decadron, if applicable:   Recent neurologic symptoms, if any:   Seizures: No  Headaches: NO  Nausea: No  Dizziness/ataxia: NO  Difficulty with hand coordination: No  Focal numbness/weakness: NO  Visual deficits/changes: NO  Confusion/Memory deficits: slight confusion  Painful bone metastases at present, if any:No  SAFETY ISSUES: falls, hx cva, LE weakness  Prior radiation?No  Pacemaker/ICD? NO  Is the patient on methotrexate? NO  Additional Complaints / other details:Hx surgery  March of 1998 frontoparietal meningioma resection,Guillian -Barre syndrome, CVA 2003,A-Fib, renal insuffiencey Mother Colon cancer, father DM, CVA BP (!) 154/68   Pulse (!) 58   Temp 97.7 F (36.5 C) (Oral)   Resp 20   Ht 5\' 11"  (1.803 m)   Wt 224 lb 12.8 oz (102 kg)   BMI 31.35 kg/m   Wt Readings from Last 3 Encounters:  11/23/16 224 lb 12.8 oz (102 kg)  08/10/16 219 lb (99.3 kg)  07/01/16 219 lb 3.2 oz (99.4 kg)

## 2016-11-23 ENCOUNTER — Ambulatory Visit
Admission: RE | Admit: 2016-11-23 | Discharge: 2016-11-23 | Disposition: A | Discharge status: Home / Self Care | Payer: PPO | Source: Ambulatory Visit | Attending: Radiation Oncology | Admitting: Radiation Oncology

## 2016-11-23 ENCOUNTER — Encounter: Payer: Self-pay | Admitting: Radiation Oncology

## 2016-11-23 VITALS — BP 154/68 | HR 58 | Temp 97.7°F | Resp 20 | Ht 71.0 in | Wt 224.8 lb

## 2016-11-23 DIAGNOSIS — I129 Hypertensive chronic kidney disease with stage 1 through stage 4 chronic kidney disease, or unspecified chronic kidney disease: Secondary | ICD-10-CM | POA: Insufficient documentation

## 2016-11-23 DIAGNOSIS — Z9889 Other specified postprocedural states: Secondary | ICD-10-CM | POA: Insufficient documentation

## 2016-11-23 DIAGNOSIS — N183 Chronic kidney disease, stage 3 (moderate): Secondary | ICD-10-CM | POA: Insufficient documentation

## 2016-11-23 DIAGNOSIS — Z9049 Acquired absence of other specified parts of digestive tract: Secondary | ICD-10-CM | POA: Diagnosis not present

## 2016-11-23 DIAGNOSIS — D320 Benign neoplasm of cerebral meninges: Secondary | ICD-10-CM | POA: Diagnosis not present

## 2016-11-23 DIAGNOSIS — Z8673 Personal history of transient ischemic attack (TIA), and cerebral infarction without residual deficits: Secondary | ICD-10-CM | POA: Insufficient documentation

## 2016-11-23 DIAGNOSIS — Z86011 Personal history of benign neoplasm of the brain: Secondary | ICD-10-CM | POA: Diagnosis not present

## 2016-11-23 DIAGNOSIS — Z510 Encounter for antineoplastic radiation therapy: Secondary | ICD-10-CM | POA: Insufficient documentation

## 2016-11-23 NOTE — Progress Notes (Signed)
Radiation Oncology         [phone] ________________________________  Name: [name] MRN: [id number]  Date: 11/23/2016  DOB: Feb 13, 1941  [id number], Ross Reichmann, MD  Kary Kos, MD     REFERRING PHYSICIAN: Kary Kos, MD   DIAGNOSIS: The encounter diagnosis was Meningioma, recurrent of brain [facility]).   HISTORY OF PRESENT ILLNESS: [name] is a 76 y.o. male seen at the request of Dr. [name]. Patient has a history of a left, frontoparietal meningioma which was resected in March 1998. Per Dr. Windy Carina note, it appears this was an atypical meningioma. He also has a history of Guillian-Barre syndrome following an intracranial postop infection. He underwent a washout procedure followed by reconstruction of his craniotomy site with the placement of a plate. He reports that he also had a CVA in 2003, and had a recurrence of Guillain-Barre in November 2017 following an episode of an infection, A-Fib, and renal insufficiency. A repeat MRI of the brain on 10/14/16 showed an unchanged lesion in the left supraclinoid measuring 2.2 x 2.1 cm and increased edema of the left external capsule and within the left frontal white matter. There was a 3.1 x 2.8 cm mass in the extraaxial right parasagittal mass that had previously been 2.9 x 2.5 cm with unchanged invasion into the sinus. There is a third small left cranial fossa mass also extraaxial now measuring 19 x 6 mm. The patient was contemplating surgery to resect the right parietal lesion, however given his medical comorbidities, decisions were made to seek alternative therapy. He presents today to determine the role of radiation therapy.     PREVIOUS RADIATION THERAPY: No   PAST MEDICAL HISTORY:  Past Medical History:  Diagnosis Date  . Atrial fibrillation ([place]) 02/2016   NEW ONSET  . Brain tumor (benign) ([place]) 1998   2 small tumors- since 2015 (Dr. [name] following)  . Carotid artery occlusion   . Cerebrovascular disease September 09, 2001   TIA, Left brain  . CKD (chronic kidney disease), stage III   . Diverticulosis   . ED (erectile dysfunction)   . Guillain-Barre syndrome ([place]) 1999  . Hx of elevated lipids   . Hyperlipidemia   . Hypertension   . Memory loss   . Stroke [facility]) 2003       PAST SURGICAL HISTORY: Past Surgical History:  Procedure Laterality Date  . BRAIN SURGERY  1998   Benign brain tumor removed, Left hemisphere- ? Meningioma  . CAROTID ENDARTERECTOMY Left September 13, 2001   LEFT cea  . CHOLECYSTECTOMY  Nov. 2001  . COLON SURGERY  Feb. 2003   Colonic polyps removed endoscopically  . COLONOSCOPY WITH PROPOFOL N/A 11/11/2015   Procedure: COLONOSCOPY WITH PROPOFOL;  Surgeon: Garlan Fair, MD;  Location: [facility] END[facility]COPY;  Service: Endoscopy;  Laterality: N/A;  . INGUINAL HERNIA REPAIR  1970's     FAMILY HISTORY:  Family History  Problem Relation Age of Onset  . Colon cancer Mother        Metastatic   . Stroke Father   . Diabetes Father      SOCIAL HISTORY:  reports that he quit smoking about 20 years ago. His smoking use included Cigarettes. He has never used smokeless tobacco. He reports that he drinks about 0.6 oz of alcohol per week . He reports that he does not use drugs. 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   ALLERGIES: Immune globulins; Influenza vaccines; and Asa [aspirin]   MEDICATIONS:  Current Outpatient  Prescriptions  Medication Sig Dispense Refill  . acetaminophen (TYLENOL) 500 MG tablet Take 1,000 mg by mouth every 8 (eight) hours as needed for moderate pain.    . cholecalciferol (VITAMIN D) 1000 units tablet Take 1,000 Units by mouth daily.    . rosuvastatin (CRESTOR) 20 MG tablet Take 20 mg by mouth.    . tamsulosin (FLOMAX) 0.4 MG CAPS capsule Take 0.4 mg by mouth daily after supper.     . vitamin B-12 (CYANOCOBALAMIN) 1000 MCG tablet Take 1,000 mcg by mouth 2 (two) times a week. Wednesday and Saturday    . warfarin (COUMADIN) 5 MG tablet Take 1 tablet (5 mg total) by  mouth daily at 6 PM. 30 tablet 0  . bethanechol (URECHOLINE) 25 MG tablet      No current facility-administered medications for this encounter.      REVIEW OF SYSTEMS: On review of systems, the patient reports that he is doing well overall. He denies seizures, headaches, difficulty with hand coordination, focal numbness/weakness, visual deficits or changes. He is positive for slight confusion. Wife reports he has had decreased memory in the past month. He denies any chest pain, shortness of breath, cough, fevers, chills, night sweats, unintended weight changes. He denies any bowel or bladder disturbances, and denies abdominal pain, nausea or vomiting. He denies any new musculoskeletal or joint aches or pains. A complete review of systems is obtained and is otherwise negative.     PHYSICAL EXAM:  Wt Readings from Last 3 Encounters:  11/23/16 224 lb 12.8 oz (102 kg)  08/10/16 219 lb (99.3 kg)  07/01/16 219 lb 3.2 oz (99.4 kg)   Temp Readings from Last 3 Encounters:  11/23/16 97.7 F (36.5 C) (Oral)  08/10/16 (!) 96.8 F (36 C) (Oral)  04/28/16 99.1 F (37.3 C) (Oral)   BP Readings from Last 3 Encounters:  11/23/16 (!) 154/68  08/10/16 123/78  08/06/16 (!) 161/84   Pulse Readings from Last 3 Encounters:  11/23/16 (!) 58  08/10/16 (!) 57  08/06/16 67   Pain Assessment Pain Score: 4  Pain Loc: Knee/10  In general this is a well appearing caucasian male in no acute distress. He is alert and oriented x4 and appropriate throughout the examination. His speech is somewhat slurred and his wife reports this is since his CVA at baseline.  HEENT reveals that the patient is normocephalic, atraumatic. EOMs are intact. PERRLA. Skin is intact without any evidence of gross lesions. Cardiovascular exam reveals a regular rate and rhythm, no clicks rubs or murmurs are auscultated. Chest is clear to auscultation bilaterally. Lymphatic assessment is performed and does not reveal any adenopathy in the  cervical, supraclavicular, axillary, or inguinal chains. Abdomen has active bowel sounds in all quadrants and is intact. The abdomen is soft, non tender, non distended. Lower extremities are negative for pretibial pitting edema, deep calf tenderness, cyanosis or clubbing. Neurologic exam reveals that he is grossly intact with intact Cranial Nerves II-XII.   ECOG = 2  0 - Asymptomatic (Fully active, able to carry on all predisease activities without restriction)  1 - Symptomatic but completely ambulatory (Restricted in physically strenuous activity but ambulatory and able to carry out work of a light or sedentary nature. For example, light housework, office work)  2 - Symptomatic, <50% in bed during the day (Ambulatory and capable of all self care but unable to carry out any work activities. Up and about more than 50% of waking hours)  3 - Symptomatic, >50%  in bed, but not bedbound (Capable of only limited self-care, confined to bed or chair 50% or more of waking hours)  4 - Bedbound (Completely disabled. Cannot carry on any self-care. Totally confined to bed or chair)  5 - Death   Eustace Pen MM, Creech RH, Tormey DC, et al. [phone]). "Toxicity and response criteria of the [facility]". [place] Oncol. 5 (6): 649-55    LABORATORY DATA:  Lab Results  Component Value Date   WBC 10.2 04/28/2016   HGB 12.2 (L) 04/28/2016   HCT 39.0 04/28/2016   MCV 88.0 04/28/2016   PLT 224 04/28/2016   Lab Results  Component Value Date   NA 141 04/28/2016   K 4.1 04/28/2016   CL 108 04/28/2016   CO2 26 04/28/2016   Lab Results  Component Value Date   ALT 21 04/15/2016   AST 26 04/15/2016   ALKPHOS 76 04/15/2016   BILITOT 0.4 04/15/2016      RADIOGRAPHY: No results found.     IMPRESSION/PLAN: 1. Recurrent Atypical Meningioma. Dr. Lisbeth Renshaw discusses the pathology findings and reviews the nature of recurrent meningiomas and the frequency of this when they are atypical. He  recommends a course of fractionated SRS style therapy and would offer him 3 fractions to the site of progression in the right parietal region. Dr. Lisbeth Renshaw discusses that while we could treat the others, he is currently asymptomatic, and radiographically they have been stable. The patient is in agreement with treamtent of the site with documented progression. We discussed the risks, benefits, short, and long term effects of radiotherapy, and the patient is interested in proceeding. Dr. Lisbeth Renshaw discusses the delivery and logistics of radiotherapy. We will be coordinating simulation and subsequent treatment. Written consent will be obtained at the time of his simulation.     The above documentation reflects my direct findings during this shared patient visit. Please see the separate note by Dr. Lisbeth Renshaw on this date for the remainder of the patient's plan of care.    Carola Rhine, PAC  This document serves as a record of services personally performed by Kyung Rudd, MD, and Shona Simpson, PA-C. It was created on their behalf by Bethann Humble, a trained medical scribe. The creation of this record is based on the scribe's personal observations and the provider's statements to them. This document has been checked and approved by the attending provider.

## 2016-11-23 NOTE — Progress Notes (Signed)
Please see the Nurse Progress Note in the MD Initial Consult Encounter for this patient. 

## 2016-11-24 ENCOUNTER — Other Ambulatory Visit: Payer: Self-pay | Admitting: Radiation Therapy

## 2016-11-24 DIAGNOSIS — D329 Benign neoplasm of meninges, unspecified: Secondary | ICD-10-CM

## 2016-11-25 ENCOUNTER — Other Ambulatory Visit: Payer: Self-pay | Admitting: Radiation Therapy

## 2016-12-03 ENCOUNTER — Ambulatory Visit
Admission: RE | Admit: 2016-12-03 | Discharge: 2016-12-03 | Disposition: A | Discharge status: Home / Self Care | Payer: PPO | Source: Ambulatory Visit | Attending: Radiation Oncology | Admitting: Radiation Oncology

## 2016-12-03 DIAGNOSIS — D329 Benign neoplasm of meninges, unspecified: Secondary | ICD-10-CM

## 2016-12-03 DIAGNOSIS — Z7901 Long term (current) use of anticoagulants: Secondary | ICD-10-CM | POA: Diagnosis not present

## 2016-12-03 DIAGNOSIS — D320 Benign neoplasm of cerebral meninges: Secondary | ICD-10-CM | POA: Diagnosis not present

## 2016-12-03 MED ORDER — GADOBENATE DIMEGLUMINE 529 MG/ML IV SOLN
20.0000 mL | Freq: Once | INTRAVENOUS | Status: AC | PRN
  Administered 2016-12-03: 20 mL via INTRAVENOUS

## 2016-12-03 NOTE — Progress Notes (Signed)
Has armband been applied?  Does patient have an allergy to IV contrast dye?:    Has patient ever received premedication for IV contrast dye?:   Does patient take metformin?: NO  If patient does take metformin when was the last dose: N/A  Date of lab work: 7/20/182 BUN:  CR: 1.5  IV site:   Has IV site been added to flowsheet?

## 2016-12-08 ENCOUNTER — Ambulatory Visit
Admission: RE | Admit: 2016-12-08 | Discharge: 2016-12-08 | Disposition: A | Discharge status: Home / Self Care | Payer: PPO | Source: Ambulatory Visit | Attending: Radiation Oncology | Admitting: Radiation Oncology

## 2016-12-08 DIAGNOSIS — D320 Benign neoplasm of cerebral meninges: Secondary | ICD-10-CM | POA: Diagnosis not present

## 2016-12-08 DIAGNOSIS — Z510 Encounter for antineoplastic radiation therapy: Secondary | ICD-10-CM | POA: Diagnosis not present

## 2016-12-10 DIAGNOSIS — D320 Benign neoplasm of cerebral meninges: Secondary | ICD-10-CM | POA: Diagnosis not present

## 2016-12-10 DIAGNOSIS — Z510 Encounter for antineoplastic radiation therapy: Secondary | ICD-10-CM | POA: Diagnosis not present

## 2016-12-16 ENCOUNTER — Encounter: Payer: Self-pay | Admitting: Radiation Oncology

## 2016-12-16 ENCOUNTER — Ambulatory Visit
Admission: RE | Admit: 2016-12-16 | Discharge: 2016-12-16 | Disposition: A | Discharge status: Home / Self Care | Payer: PPO | Source: Ambulatory Visit | Attending: Radiation Oncology | Admitting: Radiation Oncology

## 2016-12-16 DIAGNOSIS — C7931 Secondary malignant neoplasm of brain: Secondary | ICD-10-CM | POA: Diagnosis not present

## 2016-12-16 DIAGNOSIS — Z510 Encounter for antineoplastic radiation therapy: Secondary | ICD-10-CM | POA: Diagnosis not present

## 2016-12-16 DIAGNOSIS — D320 Benign neoplasm of cerebral meninges: Secondary | ICD-10-CM

## 2016-12-16 NOTE — CV Procedure (Signed)
  Name: [name]  MRN: [id number]  Date: 12/16/2016   DOB: 04-06-1941  Stereotactic Radiosurgery Operative Note  PRE-OPERATIVE DIAGNOSIS:  Solitary Brain Metastasis  POST-OPERATIVE DIAGNOSIS:  Solitary Brain Metastasis  PROCEDURE:  Stereotactic Radiosurgery  SURGEON:  Micholas Drumwright P, MD  NARRATIVE: The patient underwent a radiation treatment planning session in the radiation oncology simulation suite under the care of the radiation oncology physician and physicist.  I participated closely in the radiation treatment planning afterwards. The patient underwent planning CT which was fused to 3T high resolution MRI with 1 mm axial slices.  These images were fused on the planning system.  We contoured the gross target volumes and subsequently expanded this to yield the Planning Target Volume. I actively participated in the planning process.  I helped to define and review the target contours and also the contours of the optic pathway, eyes, brainstem and selected nearby organs at risk.  All the dose constraints for critical structures were reviewed and compared to AAPM Task Group 101.  The prescription dose conformity was reviewed.  I approved the plan electronically.    Accordingly, [name] was brought to the TrueBeam stereotactic radiation treatment linac and placed in the custom immobilization mask.  The patient was aligned according to the IR fiducial markers with BrainLab Exactrac, then orthogonal x-rays were used in ExacTrac with the 6DOF robotic table and the shifts were made to align the patient  [name] received stereotactic radiosurgery uneventfully.    The detailed description of the procedure is recorded in the radiation oncology procedure note.  I was present for the duration of the procedure.  DISPOSITION:  Following delivery, the patient was transported to nursing in stable condition and monitored for possible acute effects to be discharged to home in stable  condition with follow-up in one month.  Sheronda Parran P, MD 12/16/2016 1:29 PM

## 2016-12-16 NOTE — Progress Notes (Signed)
Patient completed [facility] 1/56 for his meningioma, and tolerated well, no c/o pain, nausea, or light headed, no blurred vision, changes stated, monitor 30 minutes, offered ginger ale, T.V. remote with in reach, offered blanke, decline per patient, rest when going home, eat something,  no strenuous exercise next 48 hours, call fo any unusual symptoms not normal to you 1:41 PM

## 2016-12-17 DIAGNOSIS — Z7901 Long term (current) use of anticoagulants: Secondary | ICD-10-CM | POA: Diagnosis not present

## 2016-12-18 DIAGNOSIS — D320 Benign neoplasm of cerebral meninges: Secondary | ICD-10-CM | POA: Insufficient documentation

## 2016-12-18 NOTE — Progress Notes (Signed)
  Radiation Oncology         [phone] ________________________________  Name: [name] MRN: [id number]  Date: 12/08/2016  DOB: Dec 11, 1940  DIAGNOSIS:     ICD-10-CM   1. Meningioma, cerebral ([place]) D32.0     NARRATIVE:  The patient was brought to the [place].  Identity was confirmed.  All relevant records and images related to the planned course of therapy were reviewed.  The patient freely provided informed written consent to proceed with treatment after reviewing the details related to the planned course of therapy. The consent form was witnessed and verified by the simulation staff. Intravenous access was established for contrast administration. Then, the patient was set-up in a stable reproducible supine position for radiation therapy.  A relocatable thermoplastic stereotactic head frame was fabricated for precise immobilization.  CT images were obtained.  Surface markings were placed.  The CT images were loaded into the planning software and fused with the patient's targeting MRI scan.  Then the target and avoidance structures were contoured.  Treatment planning then occurred.  The radiation prescription was entered and confirmed.  I have requested 3D planning  I have requested a DVH of the following structures: Brain stem, brain, left eye, right eye, lenses, optic chiasm, target volumes, uninvolved brain, and normal tissue.    SPECIAL TREATMENT PROCEDURE:  The planned course of therapy using radiation constitutes a special treatment procedure. Special care is required in the management of this patient for the following reasons. This treatment constitutes a Special Treatment Procedure for the following reason: High dose per fraction requiring special monitoring for increased toxicities of treatment including daily imaging.  The special nature of the planned course of radiotherapy will require increased physician supervision and oversight to ensure patient's safety  with optimal treatment outcomes.  PLAN:  The patient will receive 25 Gy in 5 fraction.   ------------------------------------------------  Jodelle Gross, MD, PhD

## 2016-12-18 NOTE — Progress Notes (Signed)
  Radiation Oncology         [phone] ________________________________  Name: [name] MRN: [id number]  Date: 12/16/2016  DOB: 1940-12-07   SPECIAL TREATMENT PROCEDURE   3D TREATMENT PLANNING AND DOSIMETRY: The patient's radiation plan was reviewed and approved by Dr. [name] from neurosurgery and radiation oncology prior to treatment. It showed 3-dimensional radiation distributions overlaid onto the planning CT/MRI image set. The [facility] for the target structures as well as the organs at risk were reviewed. The documentation of the 3D plan and dosimetry are filed in the radiation oncology EMR.   NARRATIVE: The patient was brought to the TrueBeam stereotactic radiation treatment machine and placed supine on the CT couch. The head frame was applied, and the patient was set up for stereotactic radiosurgery. Neurosurgery was present for the set-up and delivery   SIMULATION VERIFICATION: In the couch zero-angle position, the patient underwent Exactrac imaging using the Brainlab system with orthogonal KV images. These were carefully aligned and repeated to confirm treatment position for each of the isocenters. The Exactrac snap film verification was repeated at each couch angle.   SPECIAL TREATMENT PROCEDURE: The patient received stereotactic radiosurgery to the following target:  PTV1 target was treated using 5 Arcs to a prescription dose of 5 Gy. ExacTrac Snap verification was performed for each couch angle.   STEREOTACTIC TREATMENT MANAGEMENT: Following delivery, the patient was transported to nursing in stable condition and monitored for possible acute effects. Vital signs were recorded . The patient tolerated treatment without significant acute effects, and was discharged to home in stable condition.  PLAN: The patient will proceed with his second of 5 fractions early next week.   ------------------------------------------------  Jodelle Gross, MD, PhD

## 2016-12-20 ENCOUNTER — Ambulatory Visit
Admission: RE | Admit: 2016-12-20 | Discharge: 2016-12-20 | Disposition: A | Discharge status: Home / Self Care | Payer: PPO | Source: Ambulatory Visit | Attending: Radiation Oncology | Admitting: Radiation Oncology

## 2016-12-20 DIAGNOSIS — Z510 Encounter for antineoplastic radiation therapy: Secondary | ICD-10-CM | POA: Diagnosis not present

## 2016-12-20 NOTE — Progress Notes (Signed)
1320. Nurse monitoring complete following initial SRS treatment. Patient alert and oriented to person, place, and time. No distress noted. Denies headache, dizziness, nausea, diplopia or ringing in the ears. Patient understands to avoid strenuous activity for the next 24 hours and call [phone] with needs. Patient understands to return on Wednesday of this week for next treatment. Patient discharged ambulatory with wife and in no distress.   BP (!) 157/72 (BP Location: Right Arm, Patient Position: Sitting, Cuff Size: Normal)   Pulse 61   Temp 97.8 F (36.6 C) (Oral)   Resp 16   SpO2 96%  Wt Readings from Last 3 Encounters:  11/23/16 224 lb 12.8 oz (102 kg)  08/10/16 219 lb (99.3 kg)  07/01/16 219 lb 3.2 oz (99.4 kg)

## 2016-12-22 ENCOUNTER — Encounter: Payer: Self-pay | Admitting: Radiation Oncology

## 2016-12-22 ENCOUNTER — Ambulatory Visit
Admission: RE | Admit: 2016-12-22 | Discharge: 2016-12-22 | Disposition: A | Discharge status: Home / Self Care | Payer: PPO | Source: Ambulatory Visit | Attending: Radiation Oncology | Admitting: Radiation Oncology

## 2016-12-22 DIAGNOSIS — Z510 Encounter for antineoplastic radiation therapy: Secondary | ICD-10-CM | POA: Diagnosis not present

## 2016-12-24 ENCOUNTER — Ambulatory Visit
Admission: RE | Admit: 2016-12-24 | Discharge: 2016-12-24 | Disposition: A | Discharge status: Home / Self Care | Payer: PPO | Source: Ambulatory Visit | Attending: Radiation Oncology | Admitting: Radiation Oncology

## 2016-12-24 DIAGNOSIS — Z510 Encounter for antineoplastic radiation therapy: Secondary | ICD-10-CM | POA: Diagnosis not present

## 2016-12-27 ENCOUNTER — Encounter: Payer: Self-pay | Admitting: Radiation Oncology

## 2016-12-27 ENCOUNTER — Ambulatory Visit
Admission: RE | Admit: 2016-12-27 | Discharge: 2016-12-27 | Disposition: A | Discharge status: Home / Self Care | Payer: PPO | Source: Ambulatory Visit | Attending: Radiation Oncology | Admitting: Radiation Oncology

## 2016-12-27 DIAGNOSIS — D320 Benign neoplasm of cerebral meninges: Secondary | ICD-10-CM | POA: Diagnosis not present

## 2016-12-27 DIAGNOSIS — Z510 Encounter for antineoplastic radiation therapy: Secondary | ICD-10-CM | POA: Diagnosis not present

## 2017-01-04 ENCOUNTER — Encounter: Payer: Self-pay | Admitting: Cardiology

## 2017-01-04 ENCOUNTER — Ambulatory Visit (INDEPENDENT_AMBULATORY_CARE_PROVIDER_SITE_OTHER): Payer: PPO | Admitting: Cardiology

## 2017-01-04 VITALS — BP 148/82 | HR 62 | Ht 70.0 in | Wt 226.8 lb

## 2017-01-04 DIAGNOSIS — Z79899 Other long term (current) drug therapy: Secondary | ICD-10-CM | POA: Diagnosis not present

## 2017-01-04 DIAGNOSIS — I10 Essential (primary) hypertension: Secondary | ICD-10-CM

## 2017-01-04 DIAGNOSIS — I480 Paroxysmal atrial fibrillation: Secondary | ICD-10-CM | POA: Diagnosis not present

## 2017-01-04 LAB — PROTIME-INR
INR: 1.9 — ABNORMAL HIGH (ref 0.8–1.2)
Prothrombin Time: 19.1 s — ABNORMAL HIGH (ref 9.1–12.0)

## 2017-01-04 NOTE — Patient Instructions (Signed)
Medication Instructions:  Your physician recommends that you continue on your current medications as directed. Please refer to the Current Medication list given to you today.  If you need a refill on your cardiac medications before your next appointment, please call your pharmacy.   Labwork: INR today  Testing/Procedures: None ordered  Follow-Up: You have been referred to Coumadin clinic to establish following your INRs.  ur physician wants you to follow-up in: 6 months with Dr. Curt Bears.  You will receive a reminder letter in the mail two months in advance. If you don't receive a letter, please call our office to schedule the follow-up appointment.  Thank you for choosing [facility]!!   Trinidad Curet, RN [phone]

## 2017-01-04 NOTE — Progress Notes (Signed)
Electrophysiology Office Note   Date:  01/04/2017   ID:  [name], [name] 06/17/1940, MRN [id number]  PCP:  Lavone Orn, MD  Primary Electrophysiologist:  Sahir Tolson Meredith Leeds, MD    Chief Complaint  Patient presents with  . Follow-up    PAF     History of Present Illness: [name] is a 76 y.o. male who presents today for electrophysiology evaluation.   He was admitted to the hospital at the end of October with new onset atrial fibrillation, renal failure, left knee cellulitis. He was rate controlled with 120 mg of diltiazem as well as metoprolol and anticoagulated with warfarin. He presents to clinic today as a follow-up post hospitalization. He says that he has been feeling well and gaining strength. He was seen by orthopedics who say that his knee is improving with no further evidence of cellulitis.   Today, denies symptoms of palpitations, chest pain, shortness of breath, orthopnea, PND, lower extremity edema, claudication, dizziness, presyncope, syncope, bleeding, or neurologic sequela. The patient is tolerating medications without difficulties and is otherwise without complaint today. He is continuing to improve since his injury on a diagnosis. He did recently undergo radiation therapy due to tumors in his brain. He is otherwise doing well. He has not noted any further episodes of atrial fibrillation.  Past Medical History:  Diagnosis Date  . Atrial fibrillation ([place]) 02/2016   NEW ONSET  . Brain tumor (benign) ([place]) 1998   2 small tumors- since 2015 (Dr. [name] following)  . Carotid artery occlusion   . Cerebrovascular disease September 09, 2001   TIA, Left brain  . CKD (chronic kidney disease), stage III   . Diverticulosis   . ED (erectile dysfunction)   . Guillain-Barre syndrome ([place]) 1999  . Hx of elevated lipids   . Hyperlipidemia   . Hypertension   . Memory loss   . Stroke [facility]) 2003   Past Surgical History:  Procedure Laterality Date  . BRAIN  SURGERY  1998   Benign brain tumor removed, Left hemisphere- ? Meningioma  . CAROTID ENDARTERECTOMY Left September 13, 2001   LEFT cea  . CHOLECYSTECTOMY  Nov. 2001  . COLON SURGERY  Feb. 2003   Colonic polyps removed endoscopically  . COLONOSCOPY WITH PROPOFOL N/A 11/11/2015   Procedure: COLONOSCOPY WITH PROPOFOL;  Surgeon: Garlan Fair, MD;  Location: [facility];  Service: Endoscopy;  Laterality: N/A;  . INGUINAL HERNIA REPAIR  1970's     Current Outpatient Prescriptions  Medication Sig Dispense Refill  . acetaminophen (TYLENOL) 500 MG tablet Take 1,000 mg by mouth every 8 (eight) hours as needed for moderate pain.    . cholecalciferol (VITAMIN D) 1000 units tablet Take 1,000 Units by mouth daily.    . rosuvastatin (CRESTOR) 20 MG tablet Take 20 mg by mouth.    . tamsulosin (FLOMAX) 0.4 MG CAPS capsule Take 0.4 mg by mouth daily after supper.     . vitamin B-12 (CYANOCOBALAMIN) 1000 MCG tablet Take 1,000 mcg by mouth 2 (two) times a week. Wednesday and Saturday    . warfarin (COUMADIN) 5 MG tablet Take 1 tablet (5 mg total) by mouth daily at 6 PM. 30 tablet 0   No current facility-administered medications for this visit.     Allergies:   Immune globulins; Influenza vaccines; and Asa [aspirin]   Social History:  The patient  reports that he quit smoking about 20 years ago. His smoking use included Cigarettes. He has never used  smokeless tobacco. He reports that he drinks about 0.6 oz of alcohol per week . He reports that he does not use drugs.   Family History:  The patient's family history includes Colon cancer in his mother; Diabetes in his father; Stroke in his father.    ROS:  Please see the history of present illness.   Otherwise, review of systems is positive for none.   All other systems are reviewed and negative.   PHYSICAL EXAM: VS:  BP (!) 148/82   Pulse 62   Ht 5\' 10"  (1.778 m)   Wt 226 lb 12.8 oz (102.9 kg)   BMI 32.54 kg/m  , BMI Body mass index is 32.54  kg/m. GEN: Well nourished, well developed, in no acute distress  HEENT: normal  Neck: no JVD, carotid bruits, or masses Cardiac: RRR; no murmurs, rubs, or gallops,no edema  Respiratory:  clear to auscultation bilaterally, normal work of breathing GI: soft, nontender, nondistended, + BS MS: no deformity or atrophy  Skin: warm and dry Neuro:  Strength and sensation are intact Psych: euthymic mood, full affect  EKG:  EKG is ordered today. Personal review of the ekg ordered shows sinus rhythm, rate 60 to   Recent Labs: 03/07/2016: B Natriuretic Peptide 337.6 04/04/2016: TSH 4.274 04/13/2016: Magnesium 2.0 04/15/2016: ALT 21 04/28/2016: BUN 20; Creatinine, Ser 1.49; Hemoglobin 12.2; Platelets 224; Potassium 4.1; Sodium 141    Lipid Panel  No results found for: CHOL, TRIG, HDL, CHOLHDL, VLDL, LDLCALC, LDLDIRECT   Wt Readings from Last 3 Encounters:  01/04/17 226 lb 12.8 oz (102.9 kg)  11/23/16 224 lb 12.8 oz (102 kg)  08/10/16 219 lb (99.3 kg)      Other studies Reviewed: Additional studies/ records that were reviewed today include: TTE 03/07/16  Review of the above records today demonstrates:  - Left ventricle: The cavity size was normal. Wall thickness was   normal. Systolic function was normal. The estimated ejection   fraction was in the range of 60% to 65%. Wall motion was normal;   there were no regional wall motion abnormalities. - Aortic valve: Valve area (VTI): 1.68 cm^2. Valve area (Vmax):   1.71 cm^2. Valve area (Vmean): 1.68 cm^2. - Left atrium: The atrium was mildly dilated. - Right atrium: The atrium was mildly dilated.   ASSESSMENT AND PLAN:  1.  Paroxysmal Atrial fibrillation: Currently on Coumadin. He is not on any rate controlling medications as he has not had further atrial fibrillation since his hospitalization. We'll have him set up to work in our Coumadin clinic.  This patients CHA2DS2-VASc Score and unadjusted Ischemic Stroke Rate (% per year) is  equal to 7.2 % stroke rate/year from a score of 5  Above score calculated as 1 point each if present [CHF, HTN, DM, Vascular=MI/PAD/Aortic Plaque, Age if 65-74, or Male] Above score calculated as 2 points each if present [Age > 75, or Stroke/TIA/TE]  2. Hypertension: Mildly elevated today but normal at home and on previous checks. No changes at this time.   Current medicines are reviewed at length with the patient today.   The patient does not have concerns regarding his medicines.  The following changes were made today:  none  Labs/ tests ordered today include:  Orders Placed This Encounter  Procedures  . INR/PT  . EKG 12-Lead     Disposition:   FU with Arriyanna Mersch 6 months  Signed, Lysha Schrade Meredith Leeds, MD  01/04/2017 11:41 AM     [place]  [address] [place] [address] [phone] (office) [phone] (fax)

## 2017-01-04 NOTE — Progress Notes (Signed)
  Radiation Oncology         [phone]) [phone] ________________________________  Name: [name] MRN: [id number]  Date: 12/27/2016  DOB: 12-15-1940  End of Treatment Note  Diagnosis:   76 y.o. gentleman with Cerebral Meningioma ([place])      Indication for treatment:  Pallative       Radiation treatment dates:   12/16/16 - 12/27/16  Site/dose:   Brain 25 Gy in 5 Fx  Beams/energy:   Photon // 6X- FFF    // SBRT/SRT-3D  Narrative: The patient tolerated radiation treatment relatively well. No distress noted. Denied having headache, dizziness nausea, diplopia or ringing in the ears.     Plan: The patient has completed radiation treatment. The patient will return to radiation oncology clinic for routine followup in one month. I advised them to call or return sooner if they have any questions or concerns related to their recovery or treatment.  ------------------------------------------------  Jodelle Gross, MD, PhD  This document serves as a record of services personally performed by Kyung Rudd MD. It was created on his behalf by Delton Coombes, a trained medical scribe. The creation of this record is based on the scribe's personal observations and the provider's statements to them. This document has been checked and approved by the attending provider.

## 2017-01-06 ENCOUNTER — Telehealth: Payer: Self-pay | Admitting: Cardiology

## 2017-01-06 NOTE — Telephone Encounter (Signed)
New Message  Pt wife call requesting to speak with RN about correct dosage of coumadin pt needs to take before appt on 8/28. Please call back to discuss

## 2017-01-06 NOTE — Telephone Encounter (Signed)
Wife tells me pt only took 2.5 mg Coumadin last night.  They though instructions were to take 2.5 mg last night and 5 mg the remaining days until Coumadin clinic visit next week. Reviewed again w/ coumadin clinic.  Advised to take 7.5 mg tonight and take normal dosing tomorrow and remaining days until visit next week. She apologizes for the misunderstanding and agreealbe to plan.

## 2017-01-11 ENCOUNTER — Ambulatory Visit (INDEPENDENT_AMBULATORY_CARE_PROVIDER_SITE_OTHER): Payer: PPO | Admitting: *Deleted

## 2017-01-11 DIAGNOSIS — I482 Chronic atrial fibrillation, unspecified: Secondary | ICD-10-CM

## 2017-01-11 DIAGNOSIS — I6522 Occlusion and stenosis of left carotid artery: Secondary | ICD-10-CM

## 2017-01-11 DIAGNOSIS — Z9889 Other specified postprocedural states: Secondary | ICD-10-CM | POA: Diagnosis not present

## 2017-01-11 DIAGNOSIS — Z5181 Encounter for therapeutic drug level monitoring: Secondary | ICD-10-CM | POA: Diagnosis not present

## 2017-01-11 DIAGNOSIS — Z8673 Personal history of transient ischemic attack (TIA), and cerebral infarction without residual deficits: Secondary | ICD-10-CM | POA: Diagnosis not present

## 2017-01-11 DIAGNOSIS — I6521 Occlusion and stenosis of right carotid artery: Secondary | ICD-10-CM

## 2017-01-11 LAB — POCT INR: INR: 2.6

## 2017-01-11 NOTE — Patient Instructions (Signed)

## 2017-01-18 ENCOUNTER — Ambulatory Visit (INDEPENDENT_AMBULATORY_CARE_PROVIDER_SITE_OTHER): Payer: PPO

## 2017-01-18 DIAGNOSIS — Z5181 Encounter for therapeutic drug level monitoring: Secondary | ICD-10-CM | POA: Diagnosis not present

## 2017-01-18 DIAGNOSIS — I482 Chronic atrial fibrillation, unspecified: Secondary | ICD-10-CM

## 2017-01-18 DIAGNOSIS — Z8673 Personal history of transient ischemic attack (TIA), and cerebral infarction without residual deficits: Secondary | ICD-10-CM

## 2017-01-18 DIAGNOSIS — I4891 Unspecified atrial fibrillation: Secondary | ICD-10-CM

## 2017-01-18 LAB — POCT INR: INR: 3

## 2017-01-24 ENCOUNTER — Ambulatory Visit: Payer: Self-pay | Admitting: Radiation Oncology

## 2017-01-25 ENCOUNTER — Ambulatory Visit (INDEPENDENT_AMBULATORY_CARE_PROVIDER_SITE_OTHER): Payer: PPO

## 2017-01-25 DIAGNOSIS — Z5181 Encounter for therapeutic drug level monitoring: Secondary | ICD-10-CM

## 2017-01-25 DIAGNOSIS — L720 Epidermal cyst: Secondary | ICD-10-CM | POA: Diagnosis not present

## 2017-01-25 DIAGNOSIS — L570 Actinic keratosis: Secondary | ICD-10-CM | POA: Diagnosis not present

## 2017-01-25 DIAGNOSIS — I4891 Unspecified atrial fibrillation: Secondary | ICD-10-CM | POA: Diagnosis not present

## 2017-01-25 DIAGNOSIS — D692 Other nonthrombocytopenic purpura: Secondary | ICD-10-CM | POA: Diagnosis not present

## 2017-01-25 DIAGNOSIS — L905 Scar conditions and fibrosis of skin: Secondary | ICD-10-CM | POA: Diagnosis not present

## 2017-01-25 DIAGNOSIS — I482 Chronic atrial fibrillation, unspecified: Secondary | ICD-10-CM

## 2017-01-25 DIAGNOSIS — L821 Other seborrheic keratosis: Secondary | ICD-10-CM | POA: Diagnosis not present

## 2017-01-25 DIAGNOSIS — Z8673 Personal history of transient ischemic attack (TIA), and cerebral infarction without residual deficits: Secondary | ICD-10-CM | POA: Diagnosis not present

## 2017-01-25 DIAGNOSIS — B351 Tinea unguium: Secondary | ICD-10-CM | POA: Diagnosis not present

## 2017-01-25 DIAGNOSIS — D2272 Melanocytic nevi of left lower limb, including hip: Secondary | ICD-10-CM | POA: Diagnosis not present

## 2017-01-25 DIAGNOSIS — D1801 Hemangioma of skin and subcutaneous tissue: Secondary | ICD-10-CM | POA: Diagnosis not present

## 2017-01-25 DIAGNOSIS — Z85828 Personal history of other malignant neoplasm of skin: Secondary | ICD-10-CM | POA: Diagnosis not present

## 2017-01-25 LAB — POCT INR: INR: 3.7

## 2017-01-31 ENCOUNTER — Ambulatory Visit
Admission: RE | Admit: 2017-01-31 | Discharge: 2017-01-31 | Disposition: A | Discharge status: Home / Self Care | Payer: PPO | Source: Ambulatory Visit | Attending: Radiation Oncology | Admitting: Radiation Oncology

## 2017-01-31 ENCOUNTER — Telehealth: Payer: Self-pay | Admitting: Radiation Oncology

## 2017-01-31 ENCOUNTER — Encounter: Payer: Self-pay | Admitting: Radiation Oncology

## 2017-01-31 VITALS — BP 150/71 | HR 68 | Temp 97.6°F | Resp 18 | Ht 70.0 in | Wt 229.0 lb

## 2017-01-31 DIAGNOSIS — Z888 Allergy status to other drugs, medicaments and biological substances status: Secondary | ICD-10-CM | POA: Diagnosis not present

## 2017-01-31 DIAGNOSIS — Z886 Allergy status to analgesic agent status: Secondary | ICD-10-CM | POA: Insufficient documentation

## 2017-01-31 DIAGNOSIS — I6529 Occlusion and stenosis of unspecified carotid artery: Secondary | ICD-10-CM | POA: Diagnosis not present

## 2017-01-31 DIAGNOSIS — Z8673 Personal history of transient ischemic attack (TIA), and cerebral infarction without residual deficits: Secondary | ICD-10-CM | POA: Insufficient documentation

## 2017-01-31 DIAGNOSIS — Z800 Family history of malignant neoplasm of digestive organs: Secondary | ICD-10-CM | POA: Insufficient documentation

## 2017-01-31 DIAGNOSIS — Z87891 Personal history of nicotine dependence: Secondary | ICD-10-CM | POA: Insufficient documentation

## 2017-01-31 DIAGNOSIS — Z923 Personal history of irradiation: Secondary | ICD-10-CM | POA: Insufficient documentation

## 2017-01-31 DIAGNOSIS — Z86010 Personal history of colonic polyps: Secondary | ICD-10-CM | POA: Insufficient documentation

## 2017-01-31 DIAGNOSIS — Z9049 Acquired absence of other specified parts of digestive tract: Secondary | ICD-10-CM | POA: Diagnosis not present

## 2017-01-31 DIAGNOSIS — Z887 Allergy status to serum and vaccine status: Secondary | ICD-10-CM | POA: Insufficient documentation

## 2017-01-31 DIAGNOSIS — Z823 Family history of stroke: Secondary | ICD-10-CM | POA: Insufficient documentation

## 2017-01-31 DIAGNOSIS — I4891 Unspecified atrial fibrillation: Secondary | ICD-10-CM | POA: Diagnosis not present

## 2017-01-31 DIAGNOSIS — D329 Benign neoplasm of meninges, unspecified: Secondary | ICD-10-CM | POA: Insufficient documentation

## 2017-01-31 DIAGNOSIS — Z7901 Long term (current) use of anticoagulants: Secondary | ICD-10-CM | POA: Diagnosis not present

## 2017-01-31 DIAGNOSIS — E785 Hyperlipidemia, unspecified: Secondary | ICD-10-CM | POA: Diagnosis not present

## 2017-01-31 DIAGNOSIS — I129 Hypertensive chronic kidney disease with stage 1 through stage 4 chronic kidney disease, or unspecified chronic kidney disease: Secondary | ICD-10-CM | POA: Insufficient documentation

## 2017-01-31 DIAGNOSIS — D320 Benign neoplasm of cerebral meninges: Secondary | ICD-10-CM

## 2017-01-31 DIAGNOSIS — Z833 Family history of diabetes mellitus: Secondary | ICD-10-CM | POA: Insufficient documentation

## 2017-01-31 DIAGNOSIS — R413 Other amnesia: Secondary | ICD-10-CM | POA: Insufficient documentation

## 2017-01-31 DIAGNOSIS — Z79899 Other long term (current) drug therapy: Secondary | ICD-10-CM | POA: Diagnosis not present

## 2017-01-31 DIAGNOSIS — N183 Chronic kidney disease, stage 3 (moderate): Secondary | ICD-10-CM | POA: Insufficient documentation

## 2017-01-31 NOTE — Telephone Encounter (Signed)
Attempted to call pt regarding appt

## 2017-01-31 NOTE — Addendum Note (Signed)
Encounter addended by: Malena Edman, RN on: 01/31/2017  1:46 PM<BR>    Actions taken: Charge Capture section accepted

## 2017-01-31 NOTE — Progress Notes (Signed)
  Radiation Oncology         [phone] ________________________________  Name: [name] MRN: [id number]  Date of Service: 01/31/2017 DOB: Feb 20, 1941  Post Treatment Note  CC: Lavone Orn, MD  Lavone Orn, MD  Diagnosis:   Recurrent atypical meningioma.  Interval Since Last Radiation:  5  weeks   12/16/16 - 12/27/16 SRS Treatment: PTV1 Brain 25 Gy in 5 Fx  Beams/energy:   Photon // 6X- FFF    // SBRT/SRT-3D  Narrative:  The patient returns today for routine follow-up. The patient has a history of atypical meningoma dating back to 1998 when he had surgical resection of a left frontoparietal meningioma resected in March of that year with Dr. [name]. He's been followed since and an MRI in May of this year revealed concerns for progression in one of the three sites. He underwent fractionated SRS to the largest, and we will follow the two remaining.                       On review of systems, the patient states he's doing well. He denies any headaches, changes in vision or auditory concerns. No other episodes of dizziness or changes in mental status are noted. No other complaints are verbalized.  ALLERGIES:  is allergic to immune globulins; influenza vaccines; and asa [aspirin].  Meds: Current Outpatient Prescriptions  Medication Sig Dispense Refill  . acetaminophen (TYLENOL) 500 MG tablet Take 1,000 mg by mouth every 8 (eight) hours as needed for moderate pain.    . cholecalciferol (VITAMIN D) 1000 units tablet Take 1,000 Units by mouth daily.    . rosuvastatin (CRESTOR) 20 MG tablet Take 20 mg by mouth.    . tamsulosin (FLOMAX) 0.4 MG CAPS capsule Take 0.4 mg by mouth daily after supper.     . vitamin B-12 (CYANOCOBALAMIN) 1000 MCG tablet Take 1,000 mcg by mouth 2 (two) times a week. Wednesday and Saturday    . warfarin (COUMADIN) 5 MG tablet Take 1 tablet (5 mg total) by mouth daily at 6 PM. 30 tablet 0   No current facility-administered medications for this encounter.      Physical Findings:  height is 5\' 10"  (1.778 m) and weight is 229 lb (103.9 kg). His oral temperature is 97.6 F (36.4 C). His blood pressure is 150/71 (abnormal) and his pulse is 68. His respiration is 18 and oxygen saturation is 97%.  Pain Assessment Pain Score: 0-No pain/10 In general this is a well appearing Caucasian male in no acute distress. She's alert and oriented x4 and appropriate throughout the examination. Cardiopulmonary assessment is negative for acute distress and she exhibits normal effort.   Lab Findings: Lab Results  Component Value Date   WBC 10.2 04/28/2016   HGB 12.2 (L) 04/28/2016   HCT 39.0 04/28/2016   MCV 88.0 04/28/2016   PLT 224 04/28/2016     Radiographic Findings: No results found.  Impression/Plan: 1. Recurrent Atypical Meningioma. The patient is doing well since completing treatment. We discussed the rationale for following him with an MRI 3 months from completing treatment. If this is stable to improved, we will extend the interval to q 6 months.      Carola Rhine, PAC

## 2017-02-01 ENCOUNTER — Ambulatory Visit (INDEPENDENT_AMBULATORY_CARE_PROVIDER_SITE_OTHER): Payer: PPO | Admitting: *Deleted

## 2017-02-01 DIAGNOSIS — Z8673 Personal history of transient ischemic attack (TIA), and cerebral infarction without residual deficits: Secondary | ICD-10-CM | POA: Diagnosis not present

## 2017-02-01 DIAGNOSIS — I4891 Unspecified atrial fibrillation: Secondary | ICD-10-CM | POA: Diagnosis not present

## 2017-02-01 DIAGNOSIS — I482 Chronic atrial fibrillation, unspecified: Secondary | ICD-10-CM

## 2017-02-01 DIAGNOSIS — Z5181 Encounter for therapeutic drug level monitoring: Secondary | ICD-10-CM

## 2017-02-01 LAB — POCT INR
INR: 1.2
INR: 2

## 2017-02-08 ENCOUNTER — Ambulatory Visit (INDEPENDENT_AMBULATORY_CARE_PROVIDER_SITE_OTHER): Payer: PPO | Admitting: *Deleted

## 2017-02-08 DIAGNOSIS — Z8673 Personal history of transient ischemic attack (TIA), and cerebral infarction without residual deficits: Secondary | ICD-10-CM

## 2017-02-08 DIAGNOSIS — I482 Chronic atrial fibrillation, unspecified: Secondary | ICD-10-CM

## 2017-02-08 DIAGNOSIS — I4891 Unspecified atrial fibrillation: Secondary | ICD-10-CM | POA: Diagnosis not present

## 2017-02-08 DIAGNOSIS — Z5181 Encounter for therapeutic drug level monitoring: Secondary | ICD-10-CM | POA: Diagnosis not present

## 2017-02-08 LAB — POCT INR: INR: 1.5

## 2017-02-11 DIAGNOSIS — N183 Chronic kidney disease, stage 3 (moderate): Secondary | ICD-10-CM | POA: Diagnosis not present

## 2017-02-11 DIAGNOSIS — N400 Enlarged prostate without lower urinary tract symptoms: Secondary | ICD-10-CM | POA: Diagnosis not present

## 2017-02-11 DIAGNOSIS — I129 Hypertensive chronic kidney disease with stage 1 through stage 4 chronic kidney disease, or unspecified chronic kidney disease: Secondary | ICD-10-CM | POA: Diagnosis not present

## 2017-02-11 DIAGNOSIS — I480 Paroxysmal atrial fibrillation: Secondary | ICD-10-CM | POA: Diagnosis not present

## 2017-02-11 DIAGNOSIS — E782 Mixed hyperlipidemia: Secondary | ICD-10-CM | POA: Diagnosis not present

## 2017-02-15 ENCOUNTER — Ambulatory Visit (INDEPENDENT_AMBULATORY_CARE_PROVIDER_SITE_OTHER): Payer: PPO | Admitting: *Deleted

## 2017-02-15 DIAGNOSIS — Z8673 Personal history of transient ischemic attack (TIA), and cerebral infarction without residual deficits: Secondary | ICD-10-CM | POA: Diagnosis not present

## 2017-02-15 DIAGNOSIS — I482 Chronic atrial fibrillation, unspecified: Secondary | ICD-10-CM

## 2017-02-15 DIAGNOSIS — Z5181 Encounter for therapeutic drug level monitoring: Secondary | ICD-10-CM

## 2017-02-15 DIAGNOSIS — I4891 Unspecified atrial fibrillation: Secondary | ICD-10-CM | POA: Diagnosis not present

## 2017-02-15 LAB — POCT INR: INR: 1.9

## 2017-02-22 ENCOUNTER — Ambulatory Visit (INDEPENDENT_AMBULATORY_CARE_PROVIDER_SITE_OTHER): Payer: PPO | Admitting: *Deleted

## 2017-02-22 DIAGNOSIS — Z5181 Encounter for therapeutic drug level monitoring: Secondary | ICD-10-CM

## 2017-02-22 DIAGNOSIS — Z8673 Personal history of transient ischemic attack (TIA), and cerebral infarction without residual deficits: Secondary | ICD-10-CM

## 2017-02-22 DIAGNOSIS — I4891 Unspecified atrial fibrillation: Secondary | ICD-10-CM

## 2017-02-22 DIAGNOSIS — I482 Chronic atrial fibrillation, unspecified: Secondary | ICD-10-CM

## 2017-02-22 LAB — POCT INR: INR: 3.1

## 2017-02-24 DIAGNOSIS — I129 Hypertensive chronic kidney disease with stage 1 through stage 4 chronic kidney disease, or unspecified chronic kidney disease: Secondary | ICD-10-CM | POA: Diagnosis not present

## 2017-02-24 DIAGNOSIS — R7301 Impaired fasting glucose: Secondary | ICD-10-CM | POA: Diagnosis not present

## 2017-02-24 DIAGNOSIS — N183 Chronic kidney disease, stage 3 (moderate): Secondary | ICD-10-CM | POA: Diagnosis not present

## 2017-02-24 DIAGNOSIS — Z1389 Encounter for screening for other disorder: Secondary | ICD-10-CM | POA: Diagnosis not present

## 2017-02-24 DIAGNOSIS — N400 Enlarged prostate without lower urinary tract symptoms: Secondary | ICD-10-CM | POA: Diagnosis not present

## 2017-02-24 DIAGNOSIS — E782 Mixed hyperlipidemia: Secondary | ICD-10-CM | POA: Diagnosis not present

## 2017-02-24 DIAGNOSIS — Z0000 Encounter for general adult medical examination without abnormal findings: Secondary | ICD-10-CM | POA: Diagnosis not present

## 2017-03-04 ENCOUNTER — Ambulatory Visit (INDEPENDENT_AMBULATORY_CARE_PROVIDER_SITE_OTHER): Payer: PPO | Admitting: *Deleted

## 2017-03-04 ENCOUNTER — Encounter (INDEPENDENT_AMBULATORY_CARE_PROVIDER_SITE_OTHER): Payer: Self-pay

## 2017-03-04 DIAGNOSIS — I480 Paroxysmal atrial fibrillation: Secondary | ICD-10-CM | POA: Diagnosis not present

## 2017-03-04 DIAGNOSIS — Z8673 Personal history of transient ischemic attack (TIA), and cerebral infarction without residual deficits: Secondary | ICD-10-CM

## 2017-03-04 DIAGNOSIS — I4891 Unspecified atrial fibrillation: Secondary | ICD-10-CM

## 2017-03-04 DIAGNOSIS — Z5181 Encounter for therapeutic drug level monitoring: Secondary | ICD-10-CM

## 2017-03-04 DIAGNOSIS — I482 Chronic atrial fibrillation, unspecified: Secondary | ICD-10-CM

## 2017-03-04 LAB — POCT INR: INR: 3.2

## 2017-03-09 DIAGNOSIS — H524 Presbyopia: Secondary | ICD-10-CM | POA: Diagnosis not present

## 2017-03-09 DIAGNOSIS — H5203 Hypermetropia, bilateral: Secondary | ICD-10-CM | POA: Diagnosis not present

## 2017-03-09 DIAGNOSIS — H52203 Unspecified astigmatism, bilateral: Secondary | ICD-10-CM | POA: Diagnosis not present

## 2017-03-18 ENCOUNTER — Ambulatory Visit (INDEPENDENT_AMBULATORY_CARE_PROVIDER_SITE_OTHER): Payer: PPO | Admitting: *Deleted

## 2017-03-18 DIAGNOSIS — I482 Chronic atrial fibrillation, unspecified: Secondary | ICD-10-CM

## 2017-03-18 DIAGNOSIS — Z5181 Encounter for therapeutic drug level monitoring: Secondary | ICD-10-CM

## 2017-03-18 DIAGNOSIS — I4891 Unspecified atrial fibrillation: Secondary | ICD-10-CM | POA: Diagnosis not present

## 2017-03-18 DIAGNOSIS — Z8673 Personal history of transient ischemic attack (TIA), and cerebral infarction without residual deficits: Secondary | ICD-10-CM

## 2017-03-18 LAB — POCT INR: INR: 1.9

## 2017-03-23 ENCOUNTER — Other Ambulatory Visit: Payer: Self-pay | Admitting: Radiation Therapy

## 2017-03-23 DIAGNOSIS — D329 Benign neoplasm of meninges, unspecified: Secondary | ICD-10-CM

## 2017-04-01 ENCOUNTER — Ambulatory Visit (INDEPENDENT_AMBULATORY_CARE_PROVIDER_SITE_OTHER): Payer: PPO | Admitting: *Deleted

## 2017-04-01 DIAGNOSIS — Z8673 Personal history of transient ischemic attack (TIA), and cerebral infarction without residual deficits: Secondary | ICD-10-CM | POA: Diagnosis not present

## 2017-04-01 DIAGNOSIS — Z5181 Encounter for therapeutic drug level monitoring: Secondary | ICD-10-CM

## 2017-04-01 DIAGNOSIS — I482 Chronic atrial fibrillation, unspecified: Secondary | ICD-10-CM

## 2017-04-01 LAB — POCT INR: INR: 2.2

## 2017-04-04 ENCOUNTER — Ambulatory Visit: Payer: Self-pay | Admitting: Radiation Oncology

## 2017-04-06 ENCOUNTER — Ambulatory Visit
Admission: RE | Admit: 2017-04-06 | Discharge: 2017-04-06 | Disposition: A | Discharge status: Home / Self Care | Payer: PPO | Source: Ambulatory Visit | Attending: Radiation Oncology | Admitting: Radiation Oncology

## 2017-04-06 DIAGNOSIS — D329 Benign neoplasm of meninges, unspecified: Secondary | ICD-10-CM

## 2017-04-06 DIAGNOSIS — D320 Benign neoplasm of cerebral meninges: Secondary | ICD-10-CM | POA: Diagnosis not present

## 2017-04-06 MED ORDER — GADOBENATE DIMEGLUMINE 529 MG/ML IV SOLN
10.0000 mL | Freq: Once | INTRAVENOUS | Status: AC | PRN
  Administered 2017-04-06: 10 mL via INTRAVENOUS

## 2017-04-11 NOTE — Progress Notes (Signed)
[name] 76 y.o. man with cerebral meningioma radiation completed 12-27-16, review 04-06-17 MRI brain, FU.  PAIN: He is currently not having pain. NEURO:Alert and oriented x 3 with fluent speech,able to complete sentences without difficulty with word finding or organization of sentences.   Denies any visual disturbance, wears glasses.  Denies  any nausea or vomiting,ataxia,ring of ears,dizziness or headache. Fine motor movement-picking up objects with fingers,holding objects, writing, weakness of lower extremities:Reports he is moving slower not due to weakness.  Aphasia/Slurred speech:No Wt Readings from Last 3 Encounters:  04/14/17 223 lb 12.8 oz (101.5 kg)  01/31/17 229 lb (103.9 kg)  01/04/17 226 lb 12.8 oz (102.9 kg)  BP 129/63 (BP Location: Right Arm, Patient Position: Sitting, Cuff Size: Normal)   Pulse 60   Temp 97.6 F (36.4 C) (Oral)   Resp 20   Ht 5\' 10"  (1.778 m)   Wt 223 lb 12.8 oz (101.5 kg)   SpO2 100%   BMI 32.11 kg/m

## 2017-04-14 ENCOUNTER — Ambulatory Visit
Admission: RE | Admit: 2017-04-14 | Discharge: 2017-04-14 | Disposition: A | Discharge status: Home / Self Care | Payer: PPO | Source: Ambulatory Visit | Attending: Radiation Oncology | Admitting: Radiation Oncology

## 2017-04-14 ENCOUNTER — Other Ambulatory Visit: Payer: Self-pay

## 2017-04-14 ENCOUNTER — Encounter: Payer: Self-pay | Admitting: Radiation Oncology

## 2017-04-14 VITALS — BP 129/63 | HR 60 | Temp 97.6°F | Resp 20 | Ht 70.0 in | Wt 223.8 lb

## 2017-04-14 DIAGNOSIS — D320 Benign neoplasm of cerebral meninges: Secondary | ICD-10-CM | POA: Diagnosis not present

## 2017-04-14 DIAGNOSIS — Z08 Encounter for follow-up examination after completed treatment for malignant neoplasm: Secondary | ICD-10-CM | POA: Diagnosis not present

## 2017-04-14 DIAGNOSIS — D329 Benign neoplasm of meninges, unspecified: Secondary | ICD-10-CM | POA: Diagnosis not present

## 2017-04-14 NOTE — Progress Notes (Signed)
Radiation Oncology         [phone]) [phone] ________________________________  Name: [name] MRN: [id number]  Date of Service: 01/31/2017 DOB: 1941/02/11  Follow Up Note  CC: Lavone Orn, MD  Lavone Orn, MD  Diagnosis:   Recurrent atypical meningioma.  Interval Since Last Radiation:  3 months  12/16/16 - 12/27/16 SRS Treatment: PTV1 Lt anterior clinoid process meningioma 25 Gy in 5 Fx   Narrative:  The patient returns today for routine follow-up. The patient has a history of atypical meningoma dating back to 1998 when he had surgical resection of a left frontoparietal meningioma resected in March of that year with Dr. [name]. He's been followed since and an MRI in May of this year revealed concerns for progression in one of the three sites. He underwent fractionated SRS to the largest in the left anterior clinoid process, and we will follow the two remaining.  He underwent repeat MRI on 04/06/2017.  This revealed stability of his treated lesion as well as stability of the 2 additional lesions that continue to be followed in surveillance.                   On review of systems, the patient reports that he is doing well overall. He denies any headaches, changes in visual acuity, dizziness, or cognition. He denies any chest pain, shortness of breath, cough, fevers, chills, night sweats, unintended weight changes. He denies any bowel or bladder disturbances, and denies abdominal pain, nausea or vomiting. He denies any new musculoskeletal or joint aches or pains, new skin lesions or concerns. A complete review of systems is obtained and is otherwise negative.  Past Medical History:  Past Medical History:  Diagnosis Date  . Atrial fibrillation ([place]) 02/2016   NEW ONSET  . Brain tumor (benign) ([place]) 1998   2 small tumors- since 2015 (Dr. [name] following)  . Carotid artery occlusion   . Cerebrovascular disease September 09, 2001   TIA, Left brain  . CKD (chronic kidney disease), stage III  ([place])   . Diverticulosis   . ED (erectile dysfunction)   . Guillain-Barre syndrome ([place]) 1999  . Hx of elevated lipids   . Hyperlipidemia   . Hypertension   . Memory loss   . Stroke [facility]) 2003    Past Surgical History: Past Surgical History:  Procedure Laterality Date  . BRAIN SURGERY  1998   Benign brain tumor removed, Left hemisphere- ? Meningioma  . CAROTID ENDARTERECTOMY Left September 13, 2001   LEFT cea  . CHOLECYSTECTOMY  Nov. 2001  . COLON SURGERY  Feb. 2003   Colonic polyps removed endoscopically  . COLONOSCOPY WITH PROPOFOL N/A 11/11/2015   Procedure: COLONOSCOPY WITH PROPOFOL;  Surgeon: Garlan Fair, MD;  Location: [facility];  Service: Endoscopy;  Laterality: N/A;  . INGUINAL HERNIA REPAIR  1970's    Social History:  Social History   Socioeconomic History  . Marital status: Married    Spouse name: Not on file  . Number of children: Not on file  . Years of education: Not on file  . Highest education level: Not on file  Social Needs  . Financial resource strain: Not on file  . Food insecurity - worry: Not on file  . Food insecurity - inability: Not on file  . Transportation needs - medical: Not on file  . Transportation needs - non-medical: Not on file  Occupational History  . Not on file  Tobacco Use  . Smoking status:  Former Smoker    Types: Cigarettes    Last attempt to quit: 05/17/1996    Years since quitting: 20.9  . Smokeless tobacco: Never Used  Substance and Sexual Activity  . Alcohol use: Yes    Alcohol/week: 0.6 oz    Types: 1 Cans of beer per week    Comment: every few months  . Drug use: No  . Sexual activity: Not on file  Other Topics Concern  . Not on file  Social History Narrative  . Not on file    Family History: Family History  Problem Relation Age of Onset  . Colon cancer Mother        Metastatic   . Stroke Father   . Diabetes Father      ALLERGIES:  is allergic to immune globulins; influenza vaccines; and asa  [aspirin].  Meds: Current Outpatient Prescriptions  Medication Sig Dispense Refill  . acetaminophen (TYLENOL) 500 MG tablet Take 1,000 mg by mouth every 8 (eight) hours as needed for moderate pain.    . cholecalciferol (VITAMIN D) 1000 units tablet Take 1,000 Units by mouth daily.    . rosuvastatin (CRESTOR) 20 MG tablet Take 20 mg by mouth.    . tamsulosin (FLOMAX) 0.4 MG CAPS capsule Take 0.4 mg by mouth daily after supper.     . vitamin B-12 (CYANOCOBALAMIN) 1000 MCG tablet Take 1,000 mcg by mouth 2 (two) times a week. Wednesday and Saturday    . warfarin (COUMADIN) 5 MG tablet Take 1 tablet (5 mg total) by mouth daily at 6 PM. 30 tablet 0   No current facility-administered medications for this encounter.     Physical Findings: Wt Readings from Last 3 Encounters:  04/14/17 223 lb 12.8 oz (101.5 kg)  01/31/17 229 lb (103.9 kg)  01/04/17 226 lb 12.8 oz (102.9 kg)   Temp Readings from Last 3 Encounters:  04/14/17 97.6 F (36.4 C) (Oral)  01/31/17 97.6 F (36.4 C) (Oral)  12/27/16 97.7 F (36.5 C) (Oral)   BP Readings from Last 3 Encounters:  04/14/17 129/63  01/31/17 (!) 150/71  01/04/17 (!) 148/82   Pulse Readings from Last 3 Encounters:  04/14/17 60  01/31/17 68  01/04/17 62   Pain Assessment Pain Score: 0-No pain/10  In general this is a well appearing Caucasian male in no acute distress. He's alert and oriented x4 and appropriate throughout the examination. Cardiopulmonary assessment is negative for acute distress and he exhibits normal effort. No focal neurologic deficits are noted.   Lab Findings: Lab Results  Component Value Date   WBC 10.2 04/28/2016   HGB 12.2 (L) 04/28/2016   HCT 39.0 04/28/2016   MCV 88.0 04/28/2016   PLT 224 04/28/2016     Radiographic Findings: No results found.  Impression/Plan: 1. Recurrent Atypical Meningioma.  The patient appears to be doing well clinically, we discussed the findings from his recent MRI scan that show  stability.  We discussed the recommendations from multidisciplinary brain oncology conference where to proceed with repeat imaging in 6 months time.  He is in agreement with this plan.  He will call us with any questions or concerns prior to that visit.     Carola Rhine, PAC

## 2017-04-14 NOTE — Addendum Note (Signed)
Encounter addended by: Malena Edman, RN on: 04/14/2017 2:12 PM  Actions taken: Charge Capture section accepted

## 2017-04-19 DIAGNOSIS — I129 Hypertensive chronic kidney disease with stage 1 through stage 4 chronic kidney disease, or unspecified chronic kidney disease: Secondary | ICD-10-CM | POA: Diagnosis not present

## 2017-04-22 ENCOUNTER — Ambulatory Visit (INDEPENDENT_AMBULATORY_CARE_PROVIDER_SITE_OTHER): Payer: PPO | Admitting: *Deleted

## 2017-04-22 DIAGNOSIS — Z5181 Encounter for therapeutic drug level monitoring: Secondary | ICD-10-CM

## 2017-04-22 DIAGNOSIS — I482 Chronic atrial fibrillation, unspecified: Secondary | ICD-10-CM

## 2017-04-22 DIAGNOSIS — Z8673 Personal history of transient ischemic attack (TIA), and cerebral infarction without residual deficits: Secondary | ICD-10-CM | POA: Diagnosis not present

## 2017-04-22 LAB — POCT INR: INR: 2.5

## 2017-04-22 NOTE — Patient Instructions (Signed)
Continue taking 2.5mg  (1/2 tablet) daily except  5mg  (1 tablet) on Tuesdays, Thursdays and Saturdays. Continue eating 2 servings each week of dark leafy green vegetable.  Recheck INR in 4 weeks.  Coumadin Clinic [phone] Main [phone]

## 2017-05-20 ENCOUNTER — Ambulatory Visit (INDEPENDENT_AMBULATORY_CARE_PROVIDER_SITE_OTHER): Payer: PPO | Admitting: Pharmacist

## 2017-05-20 DIAGNOSIS — I482 Chronic atrial fibrillation, unspecified: Secondary | ICD-10-CM

## 2017-05-20 DIAGNOSIS — Z5181 Encounter for therapeutic drug level monitoring: Secondary | ICD-10-CM

## 2017-05-20 DIAGNOSIS — Z8673 Personal history of transient ischemic attack (TIA), and cerebral infarction without residual deficits: Secondary | ICD-10-CM

## 2017-05-20 LAB — POCT INR: INR: 2.2

## 2017-05-20 NOTE — Patient Instructions (Signed)
Description   Continue taking 2.5mg (1/2 tablet) daily except  5mg (1 tablet) on Tuesdays, Thursdays and Saturdays. Continue eating 2 servings each week of dark leafy green vegetable.  Recheck INR in 6 weeks.  Coumadin Clinic #[phone] Main #[phone]     

## 2017-06-23 ENCOUNTER — Other Ambulatory Visit: Payer: Self-pay | Admitting: Radiation Therapy

## 2017-07-03 NOTE — Progress Notes (Signed)
Electrophysiology Office Note   Date:  07/04/2017   ID:  [name], Alferd Apa [birth year], MRN [id number]  PCP:  Lavone Orn, MD  Primary Electrophysiologist:  Ross Meredith Leeds, MD    Chief Complaint  Patient presents with  . Follow-up    PAF     History of Present Illness: [name] is a 77 y.o. male who presents today for electrophysiology evaluation.   He was admitted to the hospital at the end of October with new onset atrial fibrillation, renal failure, left knee cellulitis. He was rate controlled with 120 mg of diltiazem as well as metoprolol and anticoagulated with warfarin.   Today, denies symptoms of palpitations, chest pain, shortness of breath, orthopnea, PND, lower extremity edema, claudication, dizziness, presyncope, syncope, bleeding, or neurologic sequela. The patient is tolerating medications without difficulties.  He has noted no further episodes of atrial fibrillation.  Past Medical History:  Diagnosis Date  . Atrial fibrillation ([place]) 02/2016   NEW ONSET  . Brain tumor (benign) ([place]) 1998   2 small tumors- since 2015 (Dr. [name] following)  . Carotid artery occlusion   . Cerebrovascular disease September 09, 2001   TIA, Left brain  . CKD (chronic kidney disease), stage III ([place])   . Diverticulosis   . ED (erectile dysfunction)   . Guillain-Barre syndrome ([place]) 1999  . Hx of elevated lipids   . Hyperlipidemia   . Hypertension   . Memory loss   . Stroke [facility]) 2003   Past Surgical History:  Procedure Laterality Date  . BRAIN SURGERY  1998   Benign brain tumor removed, Left hemisphere- ? Meningioma  . CAROTID ENDARTERECTOMY Left September 13, 2001   LEFT cea  . CHOLECYSTECTOMY  Nov. 2001  . COLON SURGERY  Feb. 2003   Colonic polyps removed endoscopically  . COLONOSCOPY WITH PROPOFOL N/A 11/11/2015   Procedure: COLONOSCOPY WITH PROPOFOL;  Surgeon: Garlan Fair, MD;  Location: [facility];  Service: Endoscopy;  Laterality: N/A;  . INGUINAL  HERNIA REPAIR  1970's     Current Outpatient Medications  Medication Sig Dispense Refill  . acetaminophen (TYLENOL) 500 MG tablet Take 1,000 mg by mouth every 8 (eight) hours as needed for moderate pain.    . cholecalciferol (VITAMIN D) 1000 units tablet Take 1,000 Units by mouth daily.    Marland Kitchen diltiazem (TIAZAC) 180 MG 24 hr capsule Take 180 mg by mouth daily.    Marland Kitchen lisinopril-hydrochlorothiazide (PRINZIDE,ZESTORETIC) 10-12.5 MG tablet Take 1 tablet daily by mouth.    . rosuvastatin (CRESTOR) 20 MG tablet Take 20 mg by mouth.    . vitamin B-12 (CYANOCOBALAMIN) 1000 MCG tablet Take 1,000 mcg by mouth 2 (two) times a week. Wednesday and Saturday    . warfarin (COUMADIN) 5 MG tablet Take 1 tablet (5 mg total) by mouth daily at 6 PM. 30 tablet 0   No current facility-administered medications for this visit.     Allergies:   Immune globulins; Influenza vaccines; and Asa [aspirin]   Social History:  The patient  reports that he quit smoking about 21 years ago. His smoking use included cigarettes. he has never used smokeless tobacco. He reports that he drinks about 0.6 oz of alcohol per week. He reports that he does not use drugs.   Family History:  The patient's family history includes Colon cancer in his mother; Diabetes in his father; Stroke in his father.    ROS:  Please see the history of present illness.  Otherwise, review of systems is positive for none.   All other systems are reviewed and negative.   PHYSICAL EXAM: VS:  BP 120/62   Pulse 64   Ht 5\' 10"  (1.778 m)   Wt 227 lb (103 kg)   BMI 32.57 kg/m  , BMI Body mass index is 32.57 kg/m. GEN: Well nourished, well developed, in no acute distress  HEENT: normal  Neck: no JVD, carotid bruits, or masses Cardiac: RRR; no murmurs, rubs, or gallops,no edema  Respiratory:  clear to auscultation bilaterally, normal work of breathing GI: soft, nontender, nondistended, + BS MS: no deformity or atrophy  Skin: warm and dry Neuro:   Strength and sensation are intact Psych: euthymic mood, full affect  EKG:  EKG is not ordered today. Personal review of the ekg ordered 01/04/17 shows SR, rate 62  Recent Labs: No results found for requested labs within last 8760 hours.    Lipid Panel  No results found for: CHOL, TRIG, HDL, CHOLHDL, VLDL, LDLCALC, LDLDIRECT   Wt Readings from Last 3 Encounters:  07/04/17 227 lb (103 kg)  04/14/17 223 lb 12.8 oz (101.5 kg)  01/31/17 229 lb (103.9 kg)      Other studies Reviewed: Additional studies/ records that were reviewed today include: TTE 03/07/16  Review of the above records today demonstrates:  - Left ventricle: The cavity size was normal. Wall thickness was   normal. Systolic function was normal. The estimated ejection   fraction was in the range of 60% to 65%. Wall motion was normal;   there were no regional wall motion abnormalities. - Aortic valve: Valve area (VTI): 1.68 cm^2. Valve area (Vmax):   1.71 cm^2. Valve area (Vmean): 1.68 cm^2. - Left atrium: The atrium was mildly dilated. - Right atrium: The atrium was mildly dilated.   ASSESSMENT AND PLAN:  1.  Paroxysmal Atrial fibrillation: On Coumadin.  He is not on rate controlling medications.  He has had no further episodes of atrial fibrillation.  No changes at this time.  I did discuss with him the options of switching anticoagulants, but due to financial constraints, they Ross stick with Coumadin.  This patients CHA2DS2-VASc Score and unadjusted Ischemic Stroke Rate (% per year) is equal to 7.2 % stroke rate/year from a score of 5  Above score calculated as 1 point each if present [CHF, HTN, DM, Vascular=MI/PAD/Aortic Plaque, Age if 65-74, or Male] Above score calculated as 2 points each if present [Age > 75, or Stroke/TIA/TE]   2. Hypertension: Well-controlled today.  No changes.   Current medicines are reviewed at length with the patient today.   The patient does not have concerns regarding his  medicines.  The following changes were made today: None  Labs/ tests ordered today include:  No orders of the defined types were placed in this encounter.    Disposition:   FU with [name] 6 months  Signed, Ross Meredith Leeds, MD  07/04/2017 9:46 AM     [facility] [address] [place] [address] [phone] (office) [phone] (fax)

## 2017-07-04 ENCOUNTER — Ambulatory Visit: Payer: PPO | Admitting: Cardiology

## 2017-07-04 ENCOUNTER — Ambulatory Visit (INDEPENDENT_AMBULATORY_CARE_PROVIDER_SITE_OTHER): Payer: PPO

## 2017-07-04 ENCOUNTER — Encounter: Payer: Self-pay | Admitting: Cardiology

## 2017-07-04 VITALS — BP 120/62 | HR 64 | Ht 70.0 in | Wt 227.0 lb

## 2017-07-04 DIAGNOSIS — I482 Chronic atrial fibrillation, unspecified: Secondary | ICD-10-CM

## 2017-07-04 DIAGNOSIS — I480 Paroxysmal atrial fibrillation: Secondary | ICD-10-CM

## 2017-07-04 DIAGNOSIS — I10 Essential (primary) hypertension: Secondary | ICD-10-CM | POA: Diagnosis not present

## 2017-07-04 DIAGNOSIS — Z5181 Encounter for therapeutic drug level monitoring: Secondary | ICD-10-CM

## 2017-07-04 DIAGNOSIS — Z8673 Personal history of transient ischemic attack (TIA), and cerebral infarction without residual deficits: Secondary | ICD-10-CM

## 2017-07-04 LAB — POCT INR: INR: 2.6

## 2017-07-04 NOTE — Patient Instructions (Signed)
Description   Continue taking 2.5mg (1/2 tablet) daily except  5mg (1 tablet) on Tuesdays, Thursdays and Saturdays. Continue eating 2 servings each week of dark leafy green vegetable.  Recheck INR in 6 weeks.  Coumadin Clinic #[phone] Main #[phone]     

## 2017-07-04 NOTE — Patient Instructions (Signed)
Medication Instructions:  Your physician recommends that you continue on your current medications as directed. Please refer to the Current Medication list given to you today.  * If you need a refill on your cardiac medications before your next appointment, please call your pharmacy. *  Labwork: None ordered  Testing/Procedures: None ordered  Follow-Up: Your physician wants you to follow-up in: 6 months with Dr. Curt Bears. with Dr. Curt Bears.  You will receive a reminder letter in the mail two months in advance. If you don't receive a letter, please call our office to schedule the follow-up appointment.  Thank you for choosing [facility]!!   Trinidad Curet, RN [phone]

## 2017-07-07 ENCOUNTER — Other Ambulatory Visit: Payer: Self-pay | Admitting: Radiation Therapy

## 2017-07-07 DIAGNOSIS — D329 Benign neoplasm of meninges, unspecified: Secondary | ICD-10-CM

## 2017-08-15 ENCOUNTER — Ambulatory Visit (INDEPENDENT_AMBULATORY_CARE_PROVIDER_SITE_OTHER): Payer: PPO | Admitting: *Deleted

## 2017-08-15 DIAGNOSIS — Z5181 Encounter for therapeutic drug level monitoring: Secondary | ICD-10-CM

## 2017-08-15 DIAGNOSIS — Z8673 Personal history of transient ischemic attack (TIA), and cerebral infarction without residual deficits: Secondary | ICD-10-CM

## 2017-08-15 DIAGNOSIS — I482 Chronic atrial fibrillation, unspecified: Secondary | ICD-10-CM

## 2017-08-15 LAB — POCT INR: INR: 1.7

## 2017-08-15 NOTE — Patient Instructions (Signed)
Description   Today take 1 tablet, then Continue taking 2.5mg  (1/2 tablet) daily except  5mg  (1 tablet) on Tuesdays, Thursdays and Saturdays. Continue eating 2 servings each week of dark leafy green vegetable.  Recheck INR in 2 weeks.  [facility]in Clinic [phone] Main [phone]

## 2017-08-16 ENCOUNTER — Ambulatory Visit (INDEPENDENT_AMBULATORY_CARE_PROVIDER_SITE_OTHER)
Admission: RE | Admit: 2017-08-16 | Discharge: 2017-08-16 | Disposition: A | Discharge status: Home / Self Care | Payer: PPO | Source: Ambulatory Visit | Attending: Family | Admitting: Family

## 2017-08-16 ENCOUNTER — Other Ambulatory Visit: Payer: Self-pay

## 2017-08-16 ENCOUNTER — Encounter: Payer: Self-pay | Admitting: Family

## 2017-08-16 ENCOUNTER — Ambulatory Visit: Payer: PPO | Admitting: Family

## 2017-08-16 ENCOUNTER — Ambulatory Visit (HOSPITAL_COMMUNITY)
Admission: RE | Admit: 2017-08-16 | Discharge: 2017-08-16 | Disposition: A | Discharge status: Home / Self Care | Payer: PPO | Source: Ambulatory Visit | Attending: Family | Admitting: Family

## 2017-08-16 VITALS — BP 109/65 | HR 57 | Temp 97.5°F | Resp 18 | Ht 71.0 in | Wt 229.0 lb

## 2017-08-16 DIAGNOSIS — I6522 Occlusion and stenosis of left carotid artery: Secondary | ICD-10-CM

## 2017-08-16 DIAGNOSIS — Z9889 Other specified postprocedural states: Secondary | ICD-10-CM

## 2017-08-16 DIAGNOSIS — I779 Disorder of arteries and arterioles, unspecified: Secondary | ICD-10-CM

## 2017-08-16 DIAGNOSIS — Z87891 Personal history of nicotine dependence: Secondary | ICD-10-CM | POA: Diagnosis not present

## 2017-08-16 DIAGNOSIS — I6521 Occlusion and stenosis of right carotid artery: Secondary | ICD-10-CM

## 2017-08-16 NOTE — Progress Notes (Signed)
Chief Complaint: Follow up Extracranial Carotid Artery Stenosis   History of Present Illness  [name] is a 77 y.o. male with a known right ICA occlusion and a history of left CEA in April 2003 by Dr. Donnetta Hutching.  He had a preoperative TIA in 2003 as manifested by slurred speech which resolved in a few hours; denies hemiparesis, denies vision changes.  He had one large benign brain tumor removed in 1998. 2 more small brain tumors were found, not malignant, monitored by Dr. [name], neurosurgeon, one is growing.  Pt denies dizziness, denies tingling, numbness, aching, or weakness in either hand/arm.  He denies claudication symptoms with walking, denies non healing wounds.   He had an exacerbation of Ezequiel Ganser' sx's in November to December 2017 (first diagnosed in 1999); prior to this, in October 2017, he was hospitalized at [facility] with pyelonephritis, and atrial fib. He then developed cellulitis in his left knee and was re hospitalized for this.   He has below knees weakness since the GB syndrome which limits his activity.    Pt Diabetic: No Pt smoker: former smoker, quit in 1998  Pt meds include: Statin : yes ASA: No Other anticoagulants/antiplatelets: Warfarin started by his cardiologist for atrial fib, Plavix stopped    Past Medical History:  Diagnosis Date  . Atrial fibrillation ([place]) 02/2016   NEW ONSET  . Brain tumor (benign) ([place]) 1998   2 small tumors- since 2015 (Dr. [name] following)  . Carotid artery occlusion   . Cerebrovascular disease September 09, 2001   TIA, Left brain  . CKD (chronic kidney disease), stage III ([place])   . Diverticulosis   . ED (erectile dysfunction)   . Guillain-Barre syndrome ([place]) 1999  . Hx of elevated lipids   . Hyperlipidemia   . Hypertension   . Memory loss   . Stroke [facility]) 2003    Social History Social History   Tobacco Use  . Smoking status: Former Smoker    Types: Cigarettes    Last attempt to quit: 05/17/1996   Years since quitting: 21.2  . Smokeless tobacco: Never Used  Substance Use Topics  . Alcohol use: Yes    Alcohol/week: 0.6 oz    Types: 1 Cans of beer per week    Comment: every few months  . Drug use: No    Family History Family History  Problem Relation Age of Onset  . Colon cancer Mother        Metastatic   . Stroke Father   . Diabetes Father     Surgical History Past Surgical History:  Procedure Laterality Date  . BRAIN SURGERY  1998   Benign brain tumor removed, Left hemisphere- ? Meningioma  . CAROTID ENDARTERECTOMY Left September 13, 2001   LEFT cea  . CHOLECYSTECTOMY  Nov. 2001  . COLON SURGERY  Feb. 2003   Colonic polyps removed endoscopically  . COLONOSCOPY WITH PROPOFOL N/A 11/11/2015   Procedure: COLONOSCOPY WITH PROPOFOL;  Surgeon: Garlan Fair, MD;  Location: [facility];  Service: Endoscopy;  Laterality: N/A;  . INGUINAL HERNIA REPAIR  1970's    Allergies  Allergen Reactions  . Immune Globulins Other (See Comments)    Tetanus Immunes Globulins    (  Pt. Cannot take the Flu shot ) Guillian Barre   . Influenza Vaccines Other (See Comments)    Guillian Barre [name]  . Rho (D) Immune Globulin Other (See Comments)    Tetanus Immunes Globulins(Pt. Cannot take the Flu  shot ) [name]   . Aspirin Other (See Comments)    Severe skin bruising  Severe skin bruising     Current Outpatient Medications  Medication Sig Dispense Refill  . acetaminophen (TYLENOL) 500 MG tablet Take 1,000 mg by mouth every 8 (eight) hours as needed for moderate pain.    . cholecalciferol (VITAMIN D) 1000 units tablet Take 1,000 Units by mouth daily.    Marland Kitchen DILT-XR 180 MG 24 hr capsule     . diltiazem (TIAZAC) 180 MG 24 hr capsule Take 180 mg by mouth daily.    Marland Kitchen lisinopril-hydrochlorothiazide (PRINZIDE,ZESTORETIC) 10-12.5 MG tablet Take 1 tablet daily by mouth.    . rosuvastatin (CRESTOR) 20 MG tablet Take 20 mg by mouth.    . vitamin B-12 (CYANOCOBALAMIN) 1000  MCG tablet Take 1,000 mcg by mouth 2 (two) times a week. Wednesday and Saturday    . warfarin (COUMADIN) 5 MG tablet Take 1 tablet (5 mg total) by mouth daily at 6 PM. 30 tablet 0   No current facility-administered medications for this visit.     Review of Systems : See HPI for pertinent positives and negatives.  Physical Examination  Vitals:   08/16/17 1204 08/16/17 1209  BP: 116/65 109/65  Pulse: (!) 58 (!) 57  Resp: 18   Temp: (!) 97.5 F (36.4 C)   TempSrc: Oral   SpO2: 94%   Weight: 229 lb (103.9 kg)   Height: 5\' 11"  (1.803 m)    Body mass index is 31.94 kg/m.  General:  WDWN obese male in NAD GAIT:normal HENT: No gross abnormalities.  Eyes: PERRLA Pulmonary: Respirations are non-labored, CTAB, no rales, rhonchi, or wheezing. Cardiac: regular rhythm, no detected murmur.  VASCULAR EXAM Carotid Bruits Right Left   Negative negative    Abdominal aortic pulse is not palpable. Radial pulses are 2+ palpable and equal.      LE Pulses Right Left  FEMORAL 1+ palpable  2+ palpable   POPLITEAL not palpable  1+ palpable   POSTERIOR TIBIAL 1+ palpable  not palpable    DORSALIS PEDIS  ANTERIOR TIBIAL 1+ palpable  1+ palpable     Gastrointestinal: soft, nontender, BS WNL, no r/g, small asymptomatic reducible ventral hernia. Musculoskeletal: No muscle atrophy/wasting. M/S 5/5 throughout, Extremities without ischemic changes Skin: No rashes, no ulcers, no cellulitis.   Neurologic:  A&O X 3; appropriate affect, sensation is normal; speech is normal, CN 2-12 intact, pain and light touch intact in extremities, motor exam as listed above. Psychiatric: Normal thought content, mood appropriate to clinical situation.     Assessment: [name] is a 77 y.o. male with a known right ICA  occlusion and a history of left CEA in April 2003.   He had a TIA in 2003 before the CEA as manifested by slurred speech which resolved in a few hours; denies hemiparesis, denies vision changes, no subsequent stroke or TIA.  His walking is limited by residual weakness in both lower legs since he had Guillain Barre' Syndrome, he denies problems with falling. He denies claudication type sx's with walking.    His atherosclerotic risk factors include obesity and former smoking.  DATA  Carotid Duplex (08/16/17): Right ICA: remains occluded Left ICA: 40-59% stenosis Left ECA:>50% stenosis Left subclavian waveforms are stenotic, right is normal. Bilateral vertebral artery flow is antegrade.  Increased stenosis in the left ICA compared to the exam on 08-10-16.    ABI (Date: 08/16/2017):  R:   ABI: 0.86 (was  0.91 on 08-10-16),   PT: bi (was bi)  DP: bi (was bi)  TBI:  0.76 (was 0.82)  L:   ABI: 0.81 (was 0.89),   PT: absent (was mono)  DP: bi (was bi)  TBI: 0.73 (was 0.79)  Slight decline in bilateral ABI and TBI, biphasic waveforms, absent left PT; mild disease bilaterally.     Plan: Graduated walking program discussed and how to achieve. Also daily seated leg exercises discussed ans demonstrated.  Follow-up in 1year with Carotid Duplex scan and ABI's. I advised pt to return sooner for concerns re the circulation in his feet/legs.    I discussed in depth with the patient the nature of atherosclerosis, and emphasized the importance of maximal medical management including strict control of blood pressure, blood glucose, and lipid levels, obtaining regular exercise, and continued cessation of smoking.  The patient is aware that without maximal medical management the underlying atherosclerotic disease process will progress, limiting the benefit of any interventions. The patient was given information about stroke prevention and what symptoms should prompt the patient to  seek immediate medical care. Thank you for allowing [place] to participate in this patient's care.  Clemon Chambers, RN, MSN, FNP-C [facility] Office: [facility] Physician: Early  08/16/17 12:13 PM

## 2017-08-16 NOTE — Patient Instructions (Signed)
Stroke Prevention Some health problems and behaviors may make it more likely for you to have a stroke. Below are ways to lessen your risk of having a stroke.  Be active for at least 30 minutes on most or all days.  Do not smoke. Try not to be around others who smoke.  Do not drink too much alcohol. ? Do not have more than 2 drinks a day if you are a man. ? Do not have more than 1 drink a day if you are a woman and are not pregnant.  Eat healthy foods, such as fruits and vegetables. If you were put on a specific diet, follow the diet as told.  Keep your cholesterol levels under control through diet and medicines. Look for foods that are low in saturated fat, trans fat, cholesterol, and are high in fiber.  If you have diabetes, follow all diet plans and take your medicine as told.  Ask your doctor if you need treatment to lower your blood pressure. If you have high blood pressure (hypertension), follow all diet plans and take your medicine as told by your doctor.  If you are 18-39 years old, have your blood pressure checked every 3-5 years. If you are age 40 or older, have your blood pressure checked every year.  Keep a healthy weight. Eat foods that are low in calories, salt, saturated fat, trans fat, and cholesterol.  Do not take drugs.  Avoid birth control pills, if this applies. Talk to your doctor about the risks of taking birth control pills.  Talk to your doctor if you have sleep problems (sleep apnea).  Take all medicine as told by your doctor. ? You may be told to take aspirin or blood thinner medicine. Take this medicine as told by your doctor. ? Understand your medicine instructions.  Make sure any other conditions you have are being taken care of.  Get help right away if:  You suddenly lose feeling (you feel numb) or have weakness in your face, arm, or leg.  Your face or eyelid hangs down to one side.  You suddenly feel confused.  You have trouble talking  (aphasia) or understanding what people are saying.  You suddenly have trouble seeing in one or both eyes.  You suddenly have trouble walking.  You are dizzy.  You lose your balance or your movements are clumsy (uncoordinated).  You suddenly have a very bad headache and you do not know the cause.  You have new chest pain.  Your heart feels like it is fluttering or skipping a beat (irregular heartbeat). Do not wait to see if the symptoms above go away. Get help right away. Call your local emergency services (911 in [place] Do not drive yourself to the hospital. This information is not intended to replace advice given to you by your health care provider. Make sure you discuss any questions you have with your health care provider. Document Released: 11/02/2011 Document Revised: 10/09/2015 Document Reviewed: 11/03/2012 Elsevier Interactive Patient Education  2018 Elsevier Inc.     Peripheral Vascular Disease Peripheral vascular disease (PVD) is a disease of the blood vessels that are not part of your heart and brain. A simple term for PVD is poor circulation. In most cases, PVD narrows the blood vessels that carry blood from your heart to the rest of your body. This can result in a decreased supply of blood to your arms, legs, and internal organs, like your stomach or kidneys. However, it most often affects   a person's lower legs and feet. There are two types of PVD.  Organic PVD. This is the more common type. It is caused by damage to the structure of blood vessels.  Functional PVD. This is caused by conditions that make blood vessels contract and tighten (spasm).  Without treatment, PVD tends to get worse over time. PVD can also lead to acute ischemic limb. This is when an arm or limb suddenly has trouble getting enough blood. This is a medical emergency. Follow these instructions at home:  Take medicines only as told by your doctor.  Do not use any tobacco products, including  cigarettes, chewing tobacco, or electronic cigarettes. If you need help quitting, ask your doctor.  Lose weight if you are overweight, and maintain a healthy weight as told by your doctor.  Eat a diet that is low in fat and cholesterol. If you need help, ask your doctor.  Exercise regularly. Ask your doctor for some good activities for you.  Take good care of your feet. ? Wear comfortable shoes that fit well. ? Check your feet often for any cuts or sores. Contact a doctor if:  You have cramps in your legs while walking.  You have leg pain when you are at rest.  You have coldness in a leg or foot.  Your skin changes.  You are unable to get or have an erection (erectile dysfunction).  You have cuts or sores on your feet that are not healing. Get help right away if:  Your arm or leg turns cold and blue.  Your arms or legs become red, warm, swollen, painful, or numb.  You have chest pain or trouble breathing.  You suddenly have weakness in your face, arm, or leg.  You become very confused or you cannot speak.  You suddenly have a very bad headache.  You suddenly cannot see. This information is not intended to replace advice given to you by your health care provider. Make sure you discuss any questions you have with your health care provider. Document Released: 07/28/2009 Document Revised: 10/09/2015 Document Reviewed: 10/11/2013 Elsevier Interactive Patient Education  2017 Elsevier Inc.  

## 2017-08-29 ENCOUNTER — Ambulatory Visit (INDEPENDENT_AMBULATORY_CARE_PROVIDER_SITE_OTHER): Payer: PPO | Admitting: *Deleted

## 2017-08-29 DIAGNOSIS — I480 Paroxysmal atrial fibrillation: Secondary | ICD-10-CM

## 2017-08-29 DIAGNOSIS — I482 Chronic atrial fibrillation, unspecified: Secondary | ICD-10-CM

## 2017-08-29 DIAGNOSIS — Z5181 Encounter for therapeutic drug level monitoring: Secondary | ICD-10-CM

## 2017-08-29 DIAGNOSIS — Z8673 Personal history of transient ischemic attack (TIA), and cerebral infarction without residual deficits: Secondary | ICD-10-CM | POA: Diagnosis not present

## 2017-08-29 DIAGNOSIS — I4891 Unspecified atrial fibrillation: Secondary | ICD-10-CM | POA: Diagnosis not present

## 2017-08-29 LAB — POCT INR: INR: 2.3

## 2017-08-29 NOTE — Patient Instructions (Signed)
Description   Continue taking 2.5mg  (1/2 tablet) daily except  5mg  (1 tablet) on Tuesdays, Thursdays and Saturdays. Continue eating 2 servings each week of dark leafy green vegetable.  Recheck INR in 3 weeks.  [facility] [phone] Main [phone]

## 2017-09-19 ENCOUNTER — Ambulatory Visit (INDEPENDENT_AMBULATORY_CARE_PROVIDER_SITE_OTHER): Payer: PPO | Admitting: *Deleted

## 2017-09-19 DIAGNOSIS — Z5181 Encounter for therapeutic drug level monitoring: Secondary | ICD-10-CM

## 2017-09-19 DIAGNOSIS — I482 Chronic atrial fibrillation, unspecified: Secondary | ICD-10-CM

## 2017-09-19 DIAGNOSIS — I480 Paroxysmal atrial fibrillation: Secondary | ICD-10-CM | POA: Diagnosis not present

## 2017-09-19 DIAGNOSIS — I739 Peripheral vascular disease, unspecified: Secondary | ICD-10-CM | POA: Diagnosis not present

## 2017-09-19 DIAGNOSIS — I4891 Unspecified atrial fibrillation: Secondary | ICD-10-CM

## 2017-09-19 DIAGNOSIS — Z8673 Personal history of transient ischemic attack (TIA), and cerebral infarction without residual deficits: Secondary | ICD-10-CM

## 2017-09-19 DIAGNOSIS — I10 Essential (primary) hypertension: Secondary | ICD-10-CM | POA: Diagnosis not present

## 2017-09-19 DIAGNOSIS — R29898 Other symptoms and signs involving the musculoskeletal system: Secondary | ICD-10-CM | POA: Diagnosis not present

## 2017-09-19 LAB — POCT INR: INR: 2.7

## 2017-09-19 NOTE — Patient Instructions (Signed)
Description   Continue taking 2.5mg  (1/2 tablet) daily except  5mg  (1 tablet) on Tuesdays, Thursdays and Saturdays. Continue eating 2 servings each week of dark leafy green vegetable.  Recheck INR in 4 weeks.  [facility] Clinic [phone] Main [phone]

## 2017-10-07 ENCOUNTER — Ambulatory Visit
Admission: RE | Admit: 2017-10-07 | Discharge: 2017-10-07 | Disposition: A | Discharge status: Home / Self Care | Payer: PPO | Source: Ambulatory Visit | Attending: Radiation Oncology | Admitting: Radiation Oncology

## 2017-10-07 DIAGNOSIS — D329 Benign neoplasm of meninges, unspecified: Secondary | ICD-10-CM

## 2017-10-07 DIAGNOSIS — D320 Benign neoplasm of cerebral meninges: Secondary | ICD-10-CM | POA: Diagnosis not present

## 2017-10-07 MED ORDER — GADOBENATE DIMEGLUMINE 529 MG/ML IV SOLN
10.0000 mL | Freq: Once | INTRAVENOUS | Status: AC | PRN
  Administered 2017-10-07: 10 mL via INTRAVENOUS

## 2017-10-13 ENCOUNTER — Ambulatory Visit: Payer: Self-pay | Admitting: Radiation Oncology

## 2017-10-13 ENCOUNTER — Telehealth: Payer: Self-pay | Admitting: Radiation Oncology

## 2017-10-13 NOTE — Telephone Encounter (Signed)
I called and let the patient know his MRI was stable in discussion from brain oncology conference and that he should have another MRI in 107months. I will mail a copy of the results to them. He will also see Dr. Mickeal Skinner at that time for evaluation following his MRI.

## 2017-10-17 ENCOUNTER — Ambulatory Visit (INDEPENDENT_AMBULATORY_CARE_PROVIDER_SITE_OTHER): Payer: PPO | Admitting: *Deleted

## 2017-10-17 DIAGNOSIS — I482 Chronic atrial fibrillation, unspecified: Secondary | ICD-10-CM

## 2017-10-17 DIAGNOSIS — Z5181 Encounter for therapeutic drug level monitoring: Secondary | ICD-10-CM

## 2017-10-17 DIAGNOSIS — I4891 Unspecified atrial fibrillation: Secondary | ICD-10-CM

## 2017-10-17 DIAGNOSIS — I480 Paroxysmal atrial fibrillation: Secondary | ICD-10-CM | POA: Diagnosis not present

## 2017-10-17 DIAGNOSIS — Z8673 Personal history of transient ischemic attack (TIA), and cerebral infarction without residual deficits: Secondary | ICD-10-CM

## 2017-10-17 LAB — POCT INR: INR: 2.4 (ref 2.0–3.0)

## 2017-10-17 NOTE — Patient Instructions (Signed)
Description   Continue taking 2.5mg  (1/2 tablet) daily except  5mg  (1 tablet) on Tuesdays, Thursdays and Saturdays. Continue eating 2 servings each week of dark leafy green vegetable.  Recheck INR in 5 weeks.  [facility]in Clinic [phone] Main [phone]

## 2017-10-24 DIAGNOSIS — I480 Paroxysmal atrial fibrillation: Secondary | ICD-10-CM | POA: Diagnosis not present

## 2017-10-24 DIAGNOSIS — I129 Hypertensive chronic kidney disease with stage 1 through stage 4 chronic kidney disease, or unspecified chronic kidney disease: Secondary | ICD-10-CM | POA: Diagnosis not present

## 2017-10-24 DIAGNOSIS — N400 Enlarged prostate without lower urinary tract symptoms: Secondary | ICD-10-CM | POA: Diagnosis not present

## 2017-10-24 DIAGNOSIS — E782 Mixed hyperlipidemia: Secondary | ICD-10-CM | POA: Diagnosis not present

## 2017-10-24 DIAGNOSIS — N183 Chronic kidney disease, stage 3 (moderate): Secondary | ICD-10-CM | POA: Diagnosis not present

## 2017-10-26 ENCOUNTER — Ambulatory Visit: Payer: PPO | Admitting: Radiation Oncology

## 2017-11-18 DIAGNOSIS — I739 Peripheral vascular disease, unspecified: Secondary | ICD-10-CM | POA: Diagnosis not present

## 2017-11-18 DIAGNOSIS — R29898 Other symptoms and signs involving the musculoskeletal system: Secondary | ICD-10-CM | POA: Diagnosis not present

## 2017-11-18 DIAGNOSIS — R5383 Other fatigue: Secondary | ICD-10-CM | POA: Diagnosis not present

## 2017-11-21 ENCOUNTER — Ambulatory Visit: Payer: PPO | Admitting: Pharmacist

## 2017-11-21 DIAGNOSIS — Z8673 Personal history of transient ischemic attack (TIA), and cerebral infarction without residual deficits: Secondary | ICD-10-CM

## 2017-11-21 DIAGNOSIS — I482 Chronic atrial fibrillation, unspecified: Secondary | ICD-10-CM

## 2017-11-21 DIAGNOSIS — Z5181 Encounter for therapeutic drug level monitoring: Secondary | ICD-10-CM

## 2017-11-21 LAB — POCT INR: INR: 2.7 (ref 2.0–3.0)

## 2017-11-21 NOTE — Patient Instructions (Signed)
Description   Continue taking 2.5mg  (1/2 tablet) daily except  5mg  (1 tablet) on Tuesdays, Thursdays and Saturdays. Continue eating 2 servings each week of dark leafy green vegetable.  Recheck INR in 6 weeks.  [facility] [phone] Main [phone]

## 2017-11-22 DIAGNOSIS — N179 Acute kidney failure, unspecified: Secondary | ICD-10-CM | POA: Diagnosis not present

## 2017-11-22 DIAGNOSIS — N183 Chronic kidney disease, stage 3 (moderate): Secondary | ICD-10-CM | POA: Diagnosis not present

## 2017-12-21 ENCOUNTER — Other Ambulatory Visit: Payer: Self-pay | Admitting: Cardiology

## 2017-12-21 MED ORDER — WARFARIN SODIUM 5 MG PO TABS: ORAL_TABLET | ORAL | 3 refills | Status: DC

## 2017-12-21 NOTE — Telephone Encounter (Signed)
Returned a call to the wife and she stated that the pt needs a refill on his Warfarin sent to [facility] on [address]. Confirmed & sent refill to pharmacy.

## 2017-12-21 NOTE — Telephone Encounter (Signed)
New message   If Home Health RN is calling please get Coumadin Nurse on the phone STAT  1.  Are you calling in regards to an appointment? NO  2.  Are you calling for a refill ? YES  3.  Are you having bleeding issues? NO 4.  Do you need clearance to hold Coumadin? NO   Please route to the Coumadin Clinic Pool

## 2017-12-29 ENCOUNTER — Other Ambulatory Visit: Payer: Self-pay | Admitting: Radiation Therapy

## 2017-12-29 DIAGNOSIS — D329 Benign neoplasm of meninges, unspecified: Secondary | ICD-10-CM

## 2018-01-02 NOTE — Progress Notes (Signed)
Electrophysiology Office Note   Date:  01/03/2018   ID:  [name] 1940-12-14, MRN [id number]  PCP:  Lavone Orn, MD  Primary Electrophysiologist:  Taeshawn Helfman Meredith Leeds, MD    No chief complaint on file.    History of Present Illness: [name] is a 77 y.o. male who presents today for electrophysiology evaluation.   He was admitted to the hospital at the end of October with new onset atrial fibrillation, renal failure, left knee cellulitis. He was rate controlled with 120 mg of diltiazem as well as metoprolol and anticoagulated with warfarin.   Today, denies symptoms of palpitations, chest pain, shortness of breath, orthopnea, PND, lower extremity edema, claudication, dizziness, presyncope, syncope, bleeding, or neurologic sequela. The patient is tolerating medications without difficulties.  Overall he is doing well.  He is noted no further episodes of atrial fibrillation.  He is able to do all his daily activities.  His only limiting factor is right knee pain.  Past Medical History:  Diagnosis Date  . Atrial fibrillation ([place]) 02/2016   NEW ONSET  . Brain tumor (benign) ([place]) 1998   2 small tumors- since 2015 (Dr. [name] following)  . Carotid artery occlusion   . Cerebrovascular disease September 09, 2001   TIA, Left brain  . CKD (chronic kidney disease), stage III ([place])   . Diverticulosis   . ED (erectile dysfunction)   . Guillain-Barre syndrome ([place]) 1999  . Hx of elevated lipids   . Hyperlipidemia   . Hypertension   . Memory loss   . Stroke [facility]) 2003   Past Surgical History:  Procedure Laterality Date  . BRAIN SURGERY  1998   Benign brain tumor removed, Left hemisphere- ? Meningioma  . CAROTID ENDARTERECTOMY Left September 13, 2001   LEFT cea  . CHOLECYSTECTOMY  Nov. 2001  . COLON SURGERY  Feb. 2003   Colonic polyps removed endoscopically  . COLONOSCOPY WITH PROPOFOL N/A 11/11/2015   Procedure: COLONOSCOPY WITH PROPOFOL;  Surgeon: Garlan Fair,  MD;  Location: [facility];  Service: Endoscopy;  Laterality: N/A;  . INGUINAL HERNIA REPAIR  1970's     Current Outpatient Medications  Medication Sig Dispense Refill  . acetaminophen (TYLENOL) 500 MG tablet Take 1,000 mg by mouth every 8 (eight) hours as needed for moderate pain.    . cholecalciferol (VITAMIN D) 1000 units tablet Take 1,000 Units by mouth daily.    Marland Kitchen diltiazem (TIAZAC) 180 MG 24 hr capsule Take 180 mg by mouth daily.    . rosuvastatin (CRESTOR) 20 MG tablet Take 20 mg by mouth.    . vitamin B-12 (CYANOCOBALAMIN) 1000 MCG tablet Take 1,000 mcg by mouth 2 (two) times a week. Wednesday and Saturday    . warfarin (COUMADIN) 5 MG tablet Take as directed by [facility] 30 tablet 3   No current facility-administered medications for this visit.     Allergies:   Immune globulins; Influenza vaccines; Rho (d) immune globulin; and Aspirin   Social History:  The patient  reports that he quit smoking about 21 years ago. His smoking use included cigarettes. He has never used smokeless tobacco. He reports that he drinks about 1.0 standard drinks of alcohol per week. He reports that he does not use drugs.   Family History:  The patient's family history includes Colon cancer in his mother; Diabetes in his father; Stroke in his father.    ROS:  Please see the history of present illness.   Otherwise,  review of systems is positive for none.   All other systems are reviewed and negative.   PHYSICAL EXAM: VS:  BP 126/68   Pulse 60   Ht 5\' 10"  (1.778 m)   Wt 227 lb 11.2 oz (103.3 kg)   SpO2 97%   BMI 32.67 kg/m  , BMI Body mass index is 32.67 kg/m. GEN: Well nourished, well developed, in no acute distress  HEENT: normal  Neck: no JVD, carotid bruits, or masses Cardiac: RRR; no murmurs, rubs, or gallops,no edema  Respiratory:  clear to auscultation bilaterally, normal work of breathing GI: soft, nontender, nondistended, + BS MS: no deformity or atrophy  Skin: warm and  dry Neuro:  Strength and sensation are intact Psych: euthymic mood, full affect  EKG:  EKG is ordered today. Personal review of the ekg ordered shows this rhythm, rate 60  Recent Labs: No results found for requested labs within last 8760 hours.    Lipid Panel  No results found for: CHOL, TRIG, HDL, CHOLHDL, VLDL, LDLCALC, LDLDIRECT   Wt Readings from Last 3 Encounters:  01/03/18 227 lb 11.2 oz (103.3 kg)  08/16/17 229 lb (103.9 kg)  07/04/17 227 lb (103 kg)      Other studies Reviewed: Additional studies/ records that were reviewed today include: TTE 03/07/16  Review of the above records today demonstrates:  - Left ventricle: The cavity size was normal. Wall thickness was   normal. Systolic function was normal. The estimated ejection   fraction was in the range of 60% to 65%. Wall motion was normal;   there were no regional wall motion abnormalities. - Aortic valve: Valve area (VTI): 1.68 cm^2. Valve area (Vmax):   1.71 cm^2. Valve area (Vmean): 1.68 cm^2. - Left atrium: The atrium was mildly dilated. - Right atrium: The atrium was mildly dilated.   ASSESSMENT AND PLAN:  1.  Paroxysmal Atrial fibrillation: On Coumadin for financial constraints.  He is not on rate controlling medications.  He has had no further episodes of atrial fibrillation.  No changes at this time.    This patients CHA2DS2-VASc Score and unadjusted Ischemic Stroke Rate (% per year) is equal to 7.2 % stroke rate/year from a score of 5  Above score calculated as 1 point each if present [CHF, HTN, DM, Vascular=MI/PAD/Aortic Plaque, Age if 65-74, or Male] Above score calculated as 2 points each if present [Age > 75, or Stroke/TIA/TE]   2. Hypertension: Well-controlled today.  No changes.   Current medicines are reviewed at length with the patient today.   The patient does not have concerns regarding his medicines.  The following changes were made today: None  Labs/ tests ordered today include:   Orders Placed This Encounter  Procedures  . EKG 12-Lead     Disposition:   FU with Cortlynn Hollinsworth 12 months  Signed, Tamiki Kuba Meredith Leeds, MD  01/03/2018 11:09 AM     [facility] [address] [place] [address] [phone] (office) [phone] (fax)

## 2018-01-03 ENCOUNTER — Encounter: Payer: Self-pay | Admitting: Cardiology

## 2018-01-03 ENCOUNTER — Ambulatory Visit (INDEPENDENT_AMBULATORY_CARE_PROVIDER_SITE_OTHER): Payer: PPO | Admitting: *Deleted

## 2018-01-03 ENCOUNTER — Ambulatory Visit: Payer: PPO | Admitting: Cardiology

## 2018-01-03 ENCOUNTER — Encounter (INDEPENDENT_AMBULATORY_CARE_PROVIDER_SITE_OTHER): Payer: Self-pay

## 2018-01-03 VITALS — BP 126/68 | HR 60 | Ht 70.0 in | Wt 227.7 lb

## 2018-01-03 DIAGNOSIS — I482 Chronic atrial fibrillation, unspecified: Secondary | ICD-10-CM

## 2018-01-03 DIAGNOSIS — Z8673 Personal history of transient ischemic attack (TIA), and cerebral infarction without residual deficits: Secondary | ICD-10-CM

## 2018-01-03 DIAGNOSIS — Z5181 Encounter for therapeutic drug level monitoring: Secondary | ICD-10-CM

## 2018-01-03 DIAGNOSIS — I10 Essential (primary) hypertension: Secondary | ICD-10-CM | POA: Diagnosis not present

## 2018-01-03 DIAGNOSIS — I480 Paroxysmal atrial fibrillation: Secondary | ICD-10-CM | POA: Diagnosis not present

## 2018-01-03 LAB — POCT INR: INR: 2.4 (ref 2.0–3.0)

## 2018-01-03 NOTE — Patient Instructions (Signed)
Description   Continue taking 2.5mg  (1/2 tablet) daily except  5mg  (1 tablet) on Tuesdays, Thursdays and Saturdays. Continue eating 2 servings each week of dark leafy green vegetable.  Recheck INR in 6 weeks.  [facility] [phone] Main [phone]

## 2018-01-03 NOTE — Patient Instructions (Signed)
Medication Instructions:  Your physician recommends that you continue on your current medications as directed. Please refer to the Current Medication list given to you today.  * If you need a refill on your cardiac medications before your next appointment, please call your pharmacy.   Labwork: None ordered  Testing/Procedures: None ordered  Follow-Up: Your physician wants you to follow-up in: 1 year with Dr. Curt Bears.  You will receive a reminder letter in the mail two months in advance. If you don't receive a letter, please call our office to schedule the follow-up appointment.  *Please note that any paperwork needing to be filled out by the provider will need to be addressed at the front desk prior to seeing the provider. Please note that any FMLA, disability or other documents regarding health condition is subject to a $25.00 charge that must be received prior to completion of paperwork in the form of a money order or check.  Thank you for choosing [facility]!!   Trinidad Curet, RN [phone]

## 2018-02-05 IMAGING — MR MR HEAD WO/W CM
10 of 13 series · 33 of 48 positions shown · IV contrast (Yes   MULTIHANCE)
Comparison: Restaging Brain MRI 10/29/2015 and earlier. Noncontrast
head CT 2200 hours today.

CLINICAL DATA: 75-year-old male with 2 intracranial meningiomas
under surveillance by neurosurgery. Recent hospitalization for UTI
and left lower extremity infection. New lower extremity weakness.
Initial encounter.

EXAM:
MRI HEAD WITHOUT AND WITH CONTRAST
TECHNIQUE: Multiplanar, multiecho pulse sequences of the brain and surrounding
structures were obtained without and with intravenous contrast.
CONTRAST:  20mL MULTIHANCE GADOBENATE DIMEGLUMINE 529 MG/ML IV SOLN

[Series 3: DWI · axial · 3.0mm · 1.09mm/px · z∈[-54,+98]mm · 9 of 104 slices shown (1 of 4)]
[im 1/104]
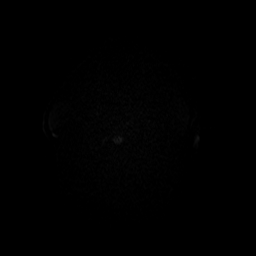
[im 13/104]
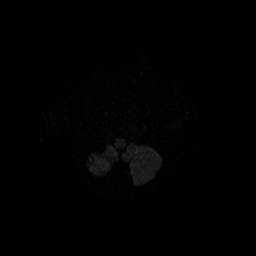
[im 26/104]
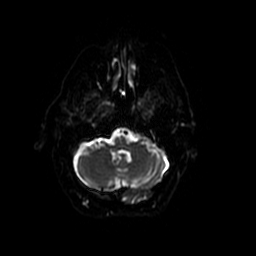
[im 39/104]
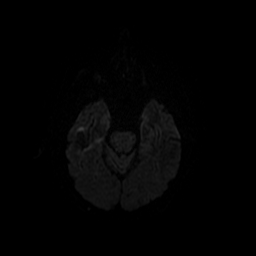
[im 52/104]
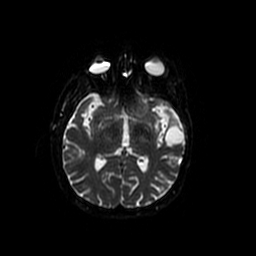
[im 65/104]
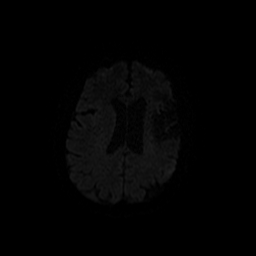
[im 78/104]
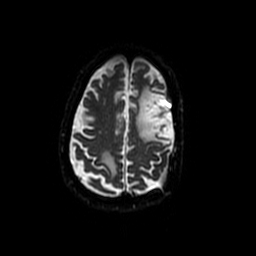
[im 91/104]
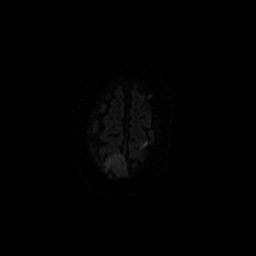
[im 104/104]
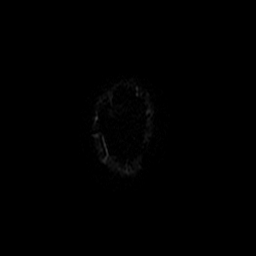

[Series 4: DWI · coronal · 5.0mm · 1.09mm/px · 6 of 74 slices shown (2 of 4)]
[im 1/74]
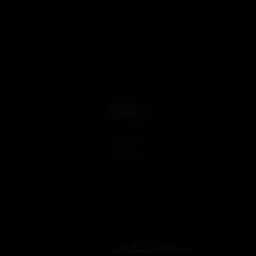
[im 15/74]
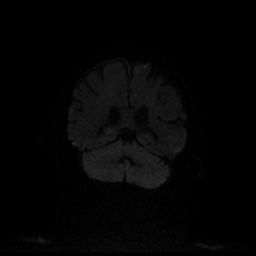
[im 30/74]
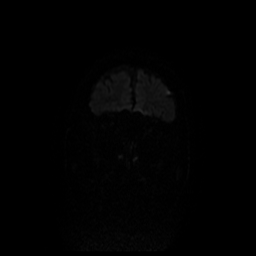
[im 44/74]
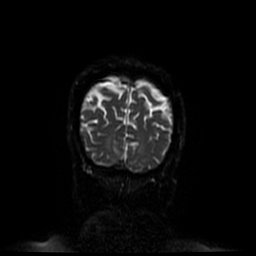
[im 59/74]
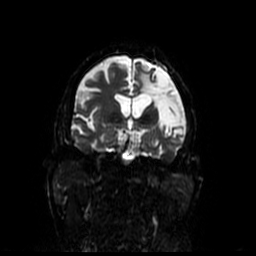
[im 74/74]
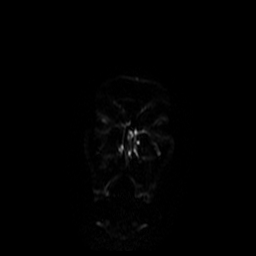

[Series 4: T1 post-contrast · coronal · 5.0mm · 0.43mm/px · 2 of 28 slices shown]
[im 1/28]
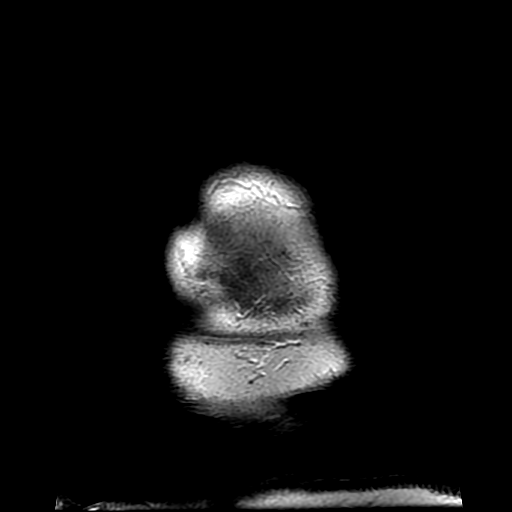
[im 28/28]
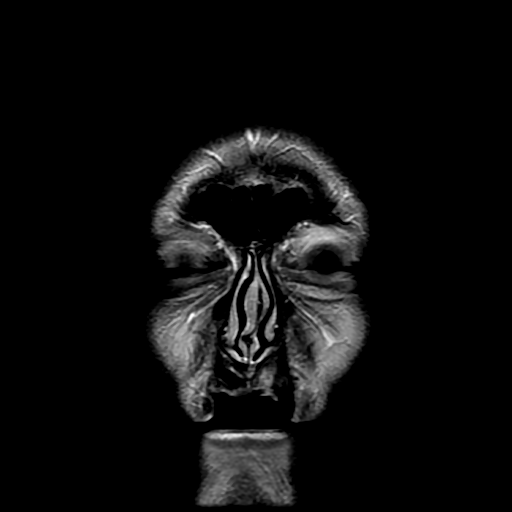

[Series 5: T1 · sagittal · 5.0mm · 0.47mm/px · 2 of 25 slices shown (1 of 2)]
[im 1/25]
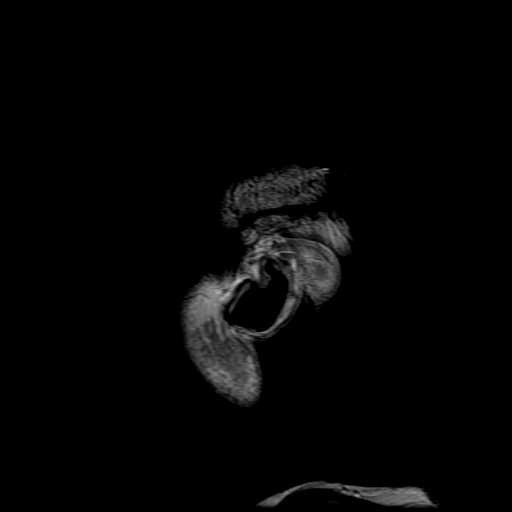
[im 25/25]
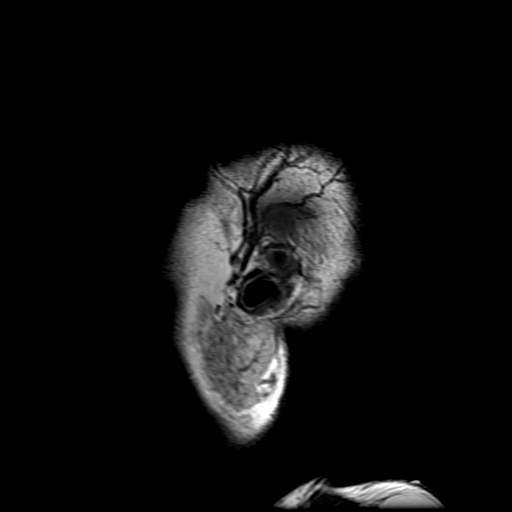

[Series 5: T1 · sagittal · 5.0mm · 0.47mm/px · 1 of 25 slices shown (2 of 2)]
[im 1/25]
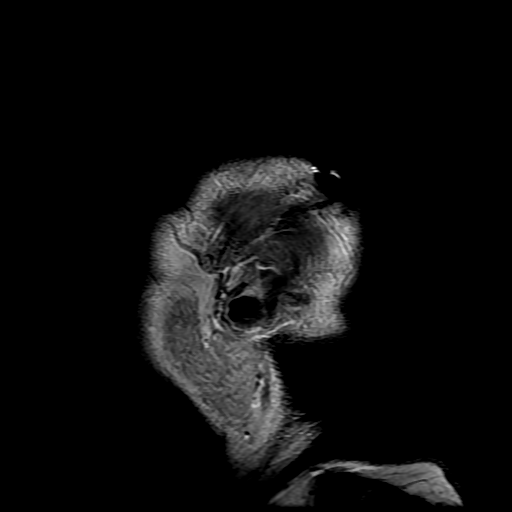

[Series 6: T2 · axial · 5.0mm · 0.43mm/px · z∈[-54,+96]mm · 2 of 26 slices shown]
[im 1/26]
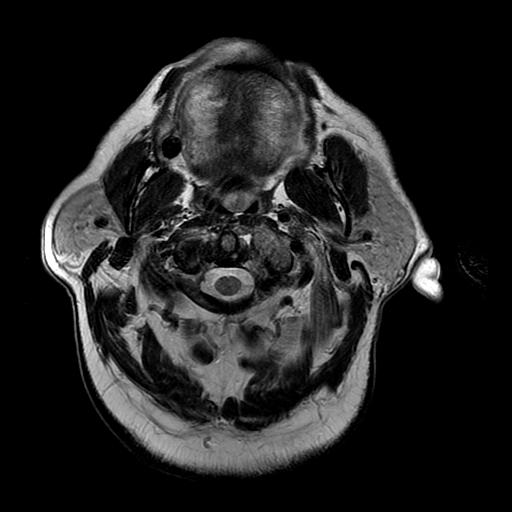
[im 26/26]
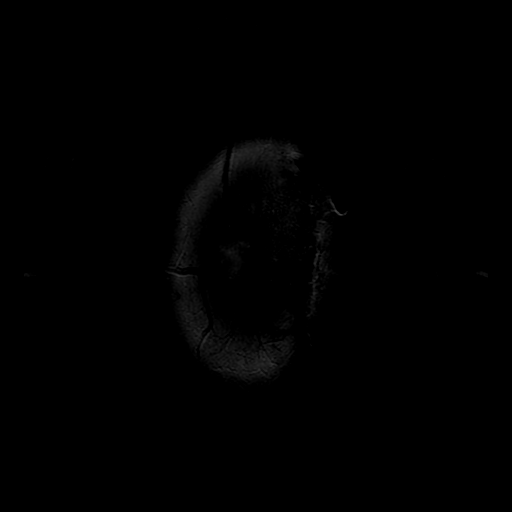

[Series 7: FLAIR · axial · 5.0mm · 0.43mm/px · z∈[-54,+96]mm · 2 of 26 slices shown]
[im 1/26]
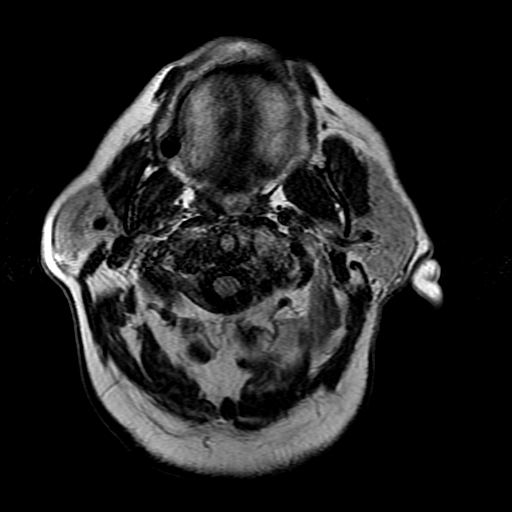
[im 26/26]
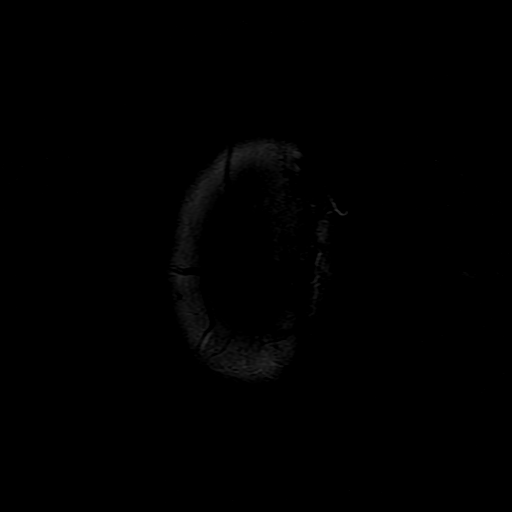

[Series 10: T2 post-contrast · coronal · 5.0mm · 0.39mm/px · 2 of 28 slices shown]
[im 1/28]
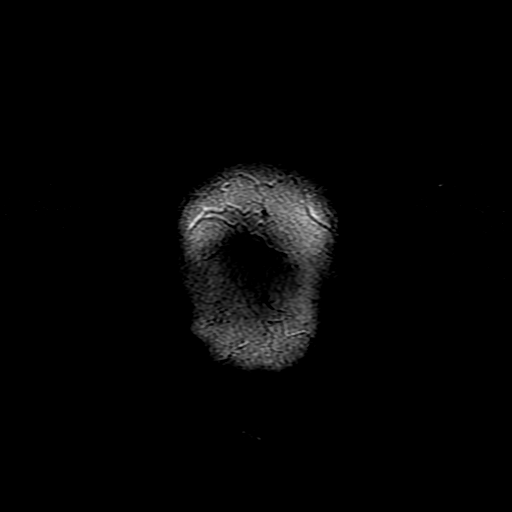
[im 28/28]
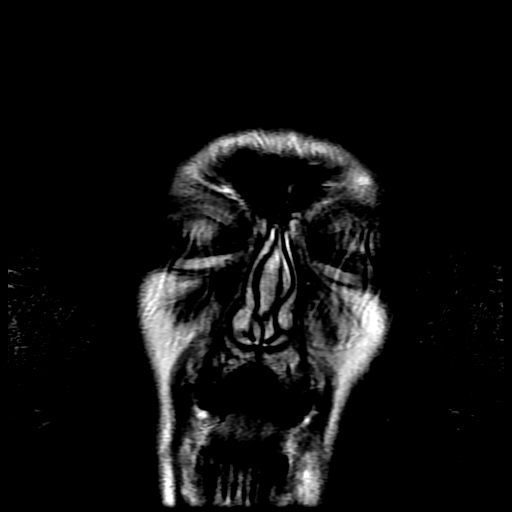

[Series 300: DWI · axial · 3.0mm · 1.09mm/px · z∈[-54,+98]mm · 4 of 52 slices shown (3 of 4)]
[im 1/52]
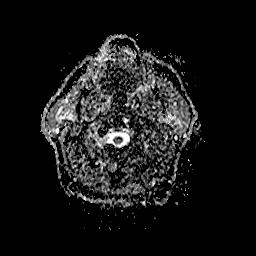
[im 18/52]
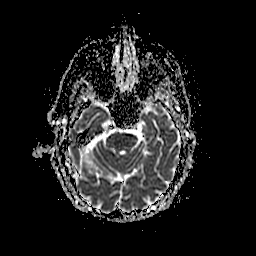
[im 35/52]
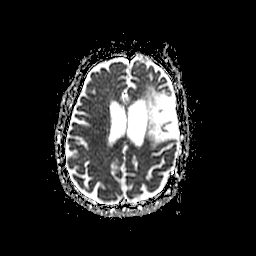
[im 52/52]
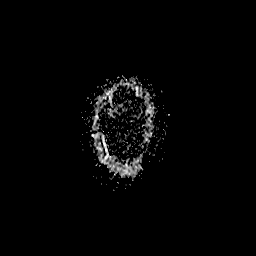

[Series 400: DWI · coronal · 5.0mm · 1.09mm/px · 3 of 37 slices shown (4 of 4)]
[im 1/37]
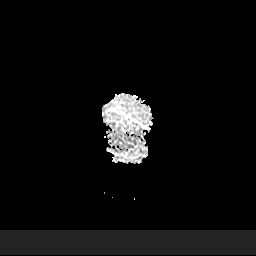
[im 19/37]
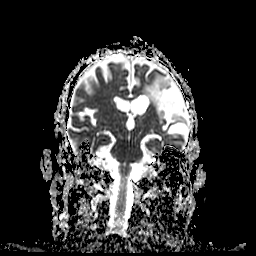
[im 37/37]
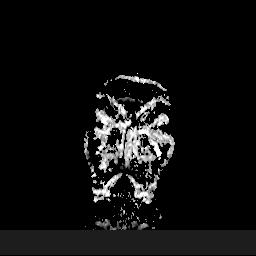

[33 of 48 positions shown; findings below may reference images not displayed]

FINDINGS: Brain: Sequelae of left craniectomy with underlying left superior
frontal gyrus, insula, and operculum encephalomalacia.

Solid lobulated right paracentral posterior vertex meningioma
encompasses 46 by 29 by 26 mm (AP by transverse by CC) versus 44 by
26 by 23 mm in [REDACTED]. Mild regional mass effect on the medial right
parietal and posterior right superior frontal gyrus has not
significantly changed. However, underlying white matter edema has
progressed (series 7, image 20 today versus series 7, image 18
previously) and appears to affect the motor strip white matter.

The second meningioma along the left sphenoid planum at the anterior
clinoid process is round and measures 21 by 20 x 13 mm (AP by
transverse by CC) and is stable. Stable mass effect on the inferior
left frontal gyrus there with no definite associated cerebral edema.

Elsewhere stable gray and white matter signal. No restricted
diffusion to suggest acute ischemia. Stable ventricle size and
configuration. No acute intracranial mass effect. No acute
intracranial hemorrhage identified. Negative pituitary and
cervicomedullary junction.

Vascular: Major intracranial vascular flow voids are stable since
[REDACTED], including evidence of chronic occlusion of the right ICA, and
superior sagittal venous sinus at the posterior vertex (series 6,
images 9 and 23 respectively).

Skull and upper cervical spine: Negative. Normal bone marrow signal.

Sinuses/Orbits: Visualized paranasal sinuses and mastoids are stable
and well pneumatized. Stable and negative orbits soft tissues.

Other: No acute scalp soft tissue findings. Visible internal
auditory structures appear normal.
IMPRESSION: 1. Mildly enlarged posterior right paracentral vertex meningioma
since [DATE] x 29 x 26 mm (versus 44 x 26 x 23 mm). No
significant change in regional mass effect but underlying white
matter edema has increased and appears to affect the medial motor
strip.
2. Left anterior clinoid process region 2 cm meningioma has not
significantly changed. No associated edema.
3. The meningioma in #1 chronically occludes a segment of the
superior sagittal sinus. Chronic occlusion of the right ICA. Chronic
left hemisphere encephalomalacia underlying the craniotomy site
craniectomy site.

## 2018-02-05 IMAGING — CT CT HEAD W/O CM
3 of 4 series · 17 of 47 positions shown, 20 images · non-contrast
Comparison: MR brain 10/29/2015.

CLINICAL DATA: Falls, bilateral lower extremity numbness and
weakness.

EXAM:
CT HEAD WITHOUT CONTRAST
TECHNIQUE: Contiguous axial images were obtained from the base of the skull
through the vertex without intravenous contrast.

[Series 201: head w/o, idose (1) · axial · non-contrast · 0.42mm/px · z∈[+32,+162]mm · 11 of 32 slices shown, 14 images]
[im 3/32  brain]
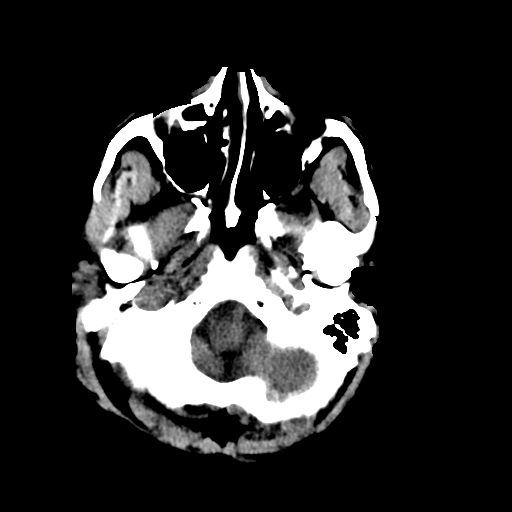
[im 3/32  bone]
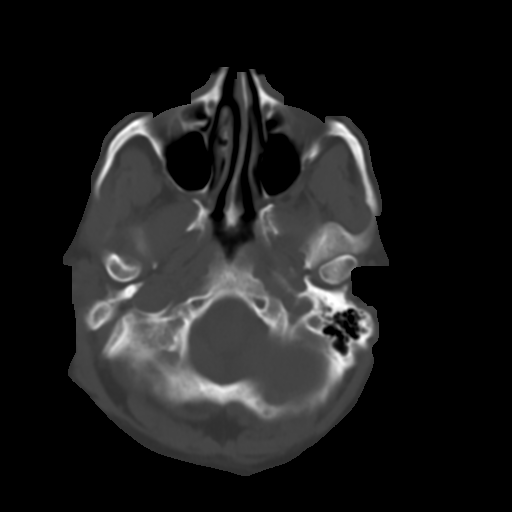
[im 5/32  brain]
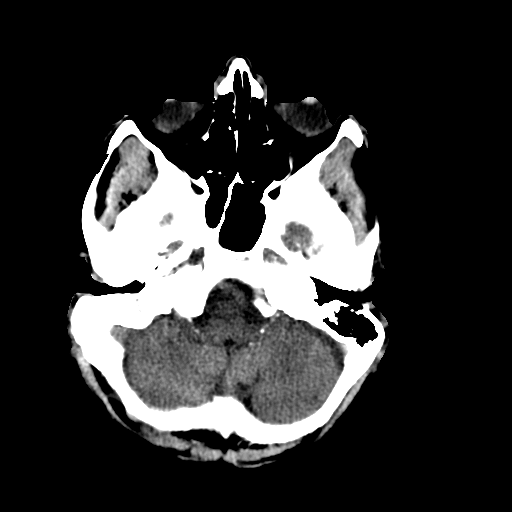
[im 7/32  brain]
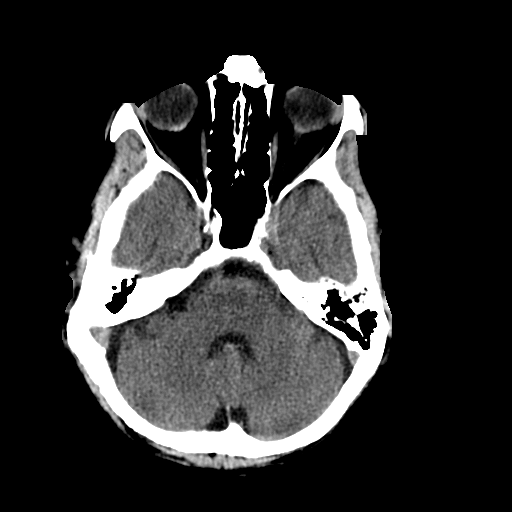
[im 12/32  brain]
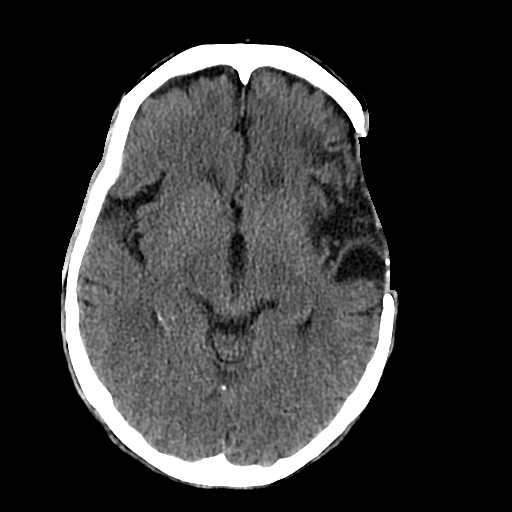
[im 14/32  brain]
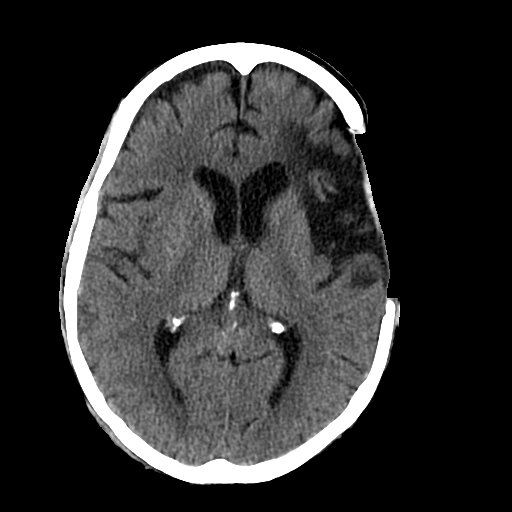
[im 14/32  bone]
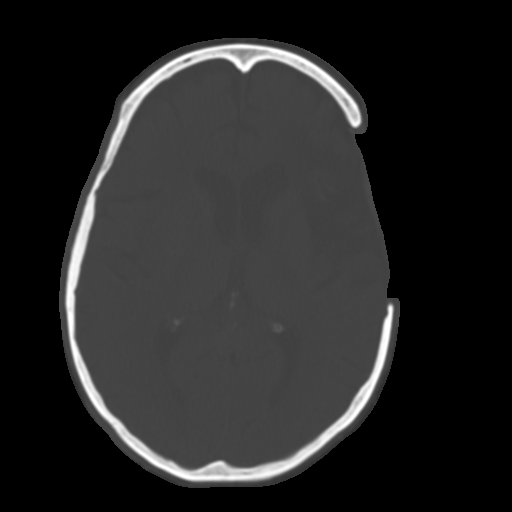
[im 16/32  brain]
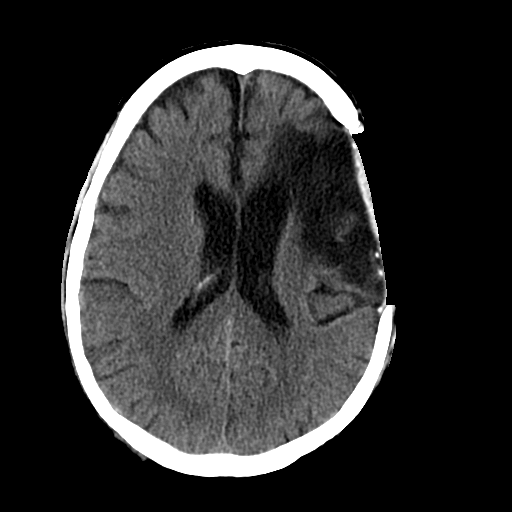
[im 18/32  brain]
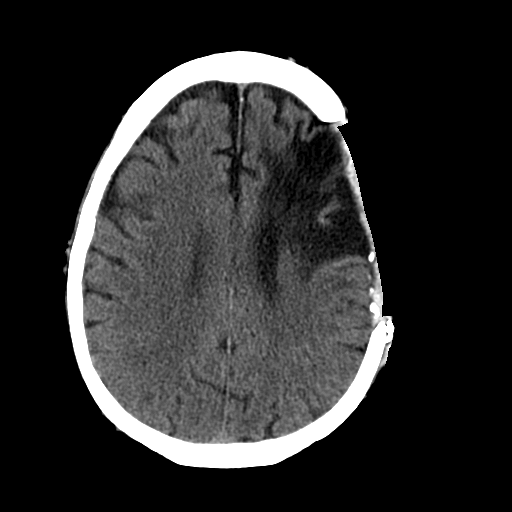
[im 20/32  brain]
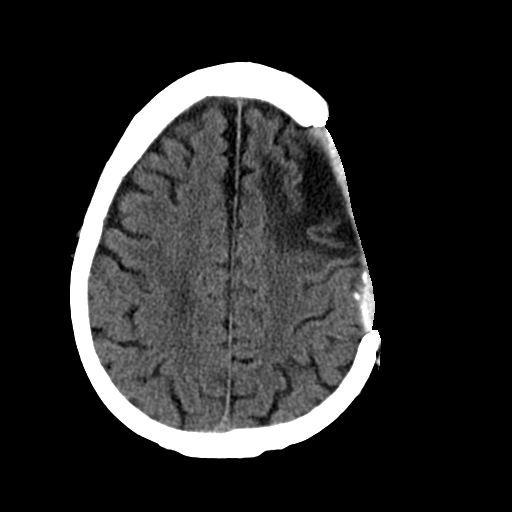
[im 25/32  brain]
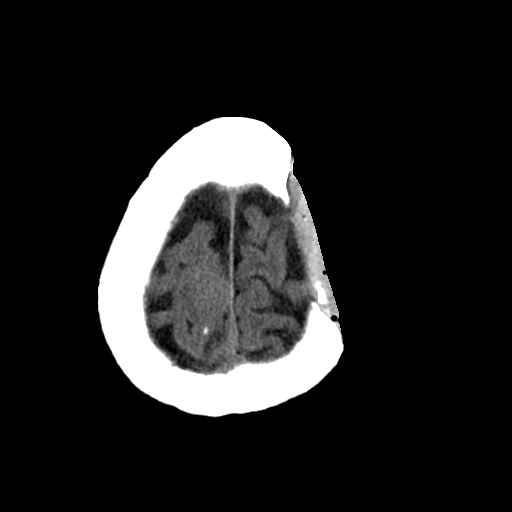
[im 25/32  bone]
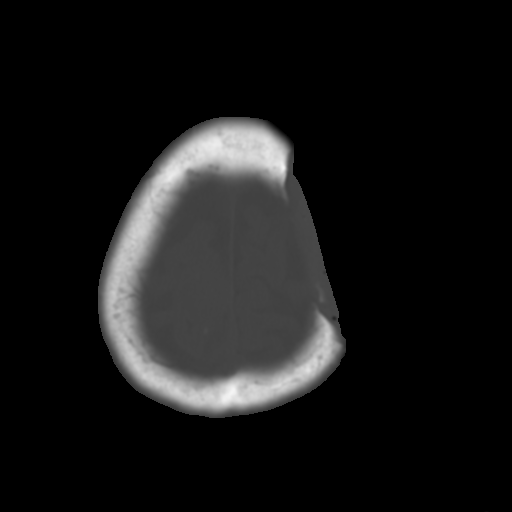
[im 27/32  brain]
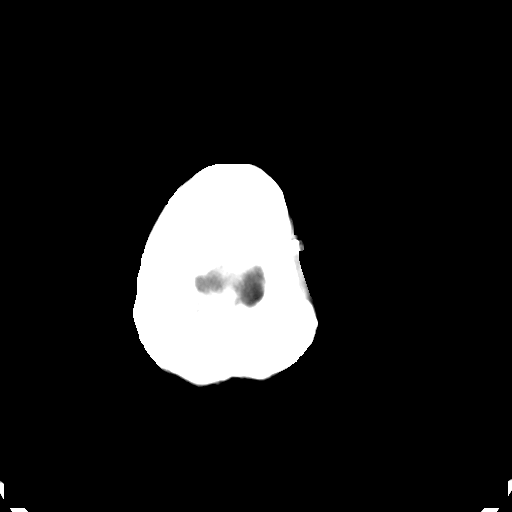
[im 29/32  brain]
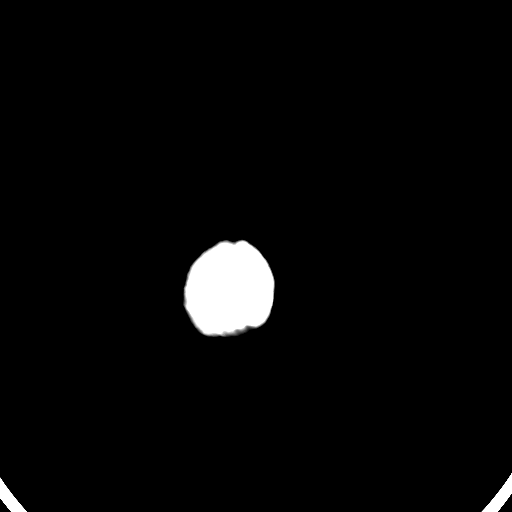

[Series 203: coronal st, idose (1) · coronal · 0.40mm/px · 3 of 68 slices shown]
[im 23/68  brain]
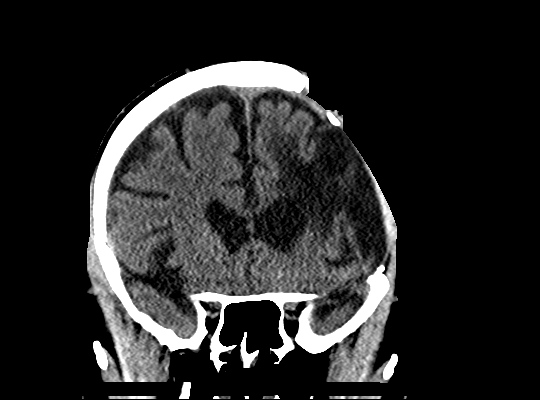
[im 30/68  brain]
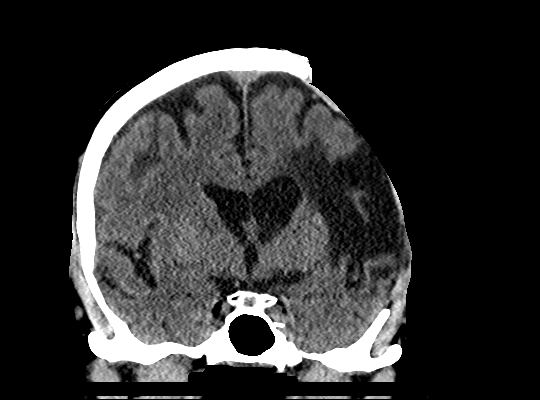
[im 38/68  brain]
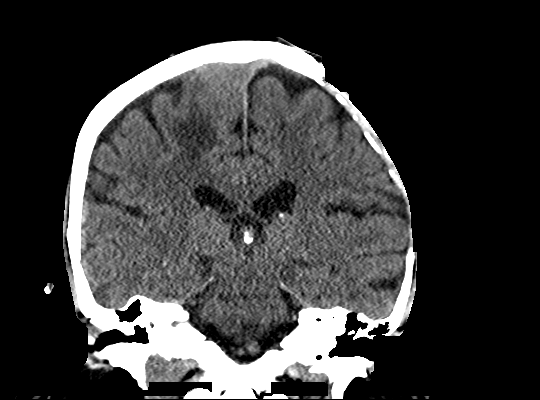

[Series 204: sagittal st, idose (1) · sagittal · 0.40mm/px · 3 of 72 slices shown]
[im 24/72  brain]
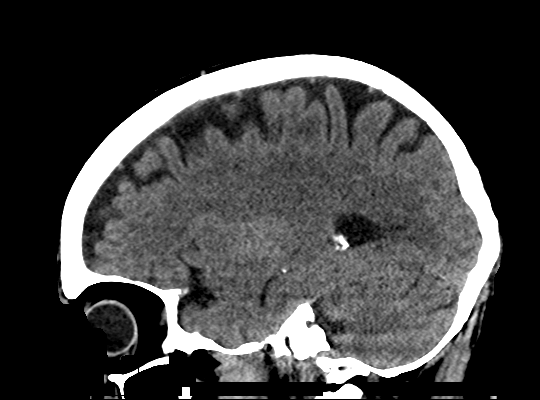
[im 36/72  brain]
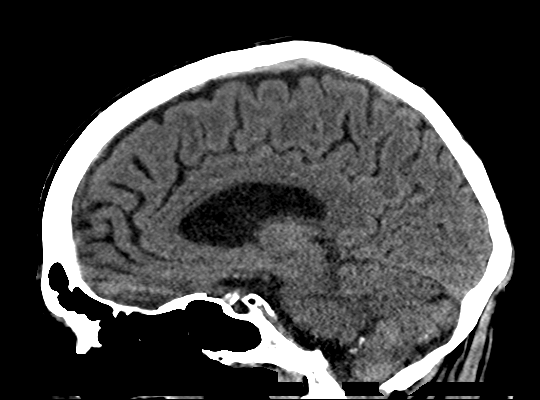
[im 48/72  brain]
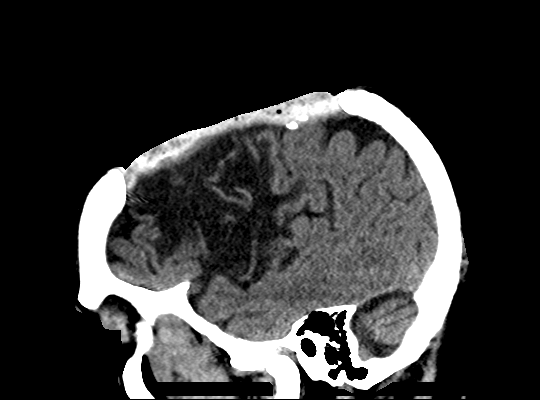

[17 of 47 positions shown; findings below may reference images not displayed]

FINDINGS: Brain: No evidence of an acute infarct, acute hemorrhage or
hydrocephalus. A 3.5 cm hyper attenuating mass along the inner table
of the high right parietal bone is unchanged. Adjacent
low-attenuation/edema in the right parietal deep white matter may be
slightly increased from 10/29/2015. A smaller hyperattenuating
lesion along the floor of the left anterior cranial fossa (series
201, image 10) was better seen on 10/29/2015. Mild atrophy.

Vascular: No hyperdense vessel or unexpected calcification.

Skull: Left frontoparietal craniectomy.

Sinuses/Orbits: Tiny right mastoid effusion.

Other: None.
IMPRESSION: 1. 3.5 cm meningioma along the right parietal vertex with suspected
increase in the extent of edema in the adjacent right parietal deep
white matter.
2. No evidence of an acute infarct.
3. Left MCA territory encephalomalacia.
4. Small meningioma along the floor of the left anterior cranial
fossa, better seen on 10/29/2015.

## 2018-02-14 ENCOUNTER — Ambulatory Visit: Payer: PPO

## 2018-02-14 DIAGNOSIS — I482 Chronic atrial fibrillation, unspecified: Secondary | ICD-10-CM

## 2018-02-14 DIAGNOSIS — Z5181 Encounter for therapeutic drug level monitoring: Secondary | ICD-10-CM

## 2018-02-14 DIAGNOSIS — Z8673 Personal history of transient ischemic attack (TIA), and cerebral infarction without residual deficits: Secondary | ICD-10-CM | POA: Diagnosis not present

## 2018-02-14 LAB — POCT INR: INR: 2 (ref 2.0–3.0)

## 2018-02-14 NOTE — Patient Instructions (Signed)
Continue taking 2.5mg  (1/2 tablet) daily except  5mg  (1 tablet) on Tuesdays, Thursdays and Saturdays. Continue eating 2 servings each week of dark leafy green vegetable.  Recheck INR in 6 weeks.  [facility] [phone] Main [phone]

## 2018-03-03 DIAGNOSIS — I779 Disorder of arteries and arterioles, unspecified: Secondary | ICD-10-CM | POA: Diagnosis not present

## 2018-03-03 DIAGNOSIS — N183 Chronic kidney disease, stage 3 (moderate): Secondary | ICD-10-CM | POA: Diagnosis not present

## 2018-03-03 DIAGNOSIS — Z0000 Encounter for general adult medical examination without abnormal findings: Secondary | ICD-10-CM | POA: Diagnosis not present

## 2018-03-03 DIAGNOSIS — I739 Peripheral vascular disease, unspecified: Secondary | ICD-10-CM | POA: Diagnosis not present

## 2018-03-03 DIAGNOSIS — I129 Hypertensive chronic kidney disease with stage 1 through stage 4 chronic kidney disease, or unspecified chronic kidney disease: Secondary | ICD-10-CM | POA: Diagnosis not present

## 2018-03-03 DIAGNOSIS — E782 Mixed hyperlipidemia: Secondary | ICD-10-CM | POA: Diagnosis not present

## 2018-03-03 DIAGNOSIS — G8929 Other chronic pain: Secondary | ICD-10-CM | POA: Diagnosis not present

## 2018-03-03 DIAGNOSIS — Z1389 Encounter for screening for other disorder: Secondary | ICD-10-CM | POA: Diagnosis not present

## 2018-03-03 DIAGNOSIS — M25561 Pain in right knee: Secondary | ICD-10-CM | POA: Diagnosis not present

## 2018-03-03 DIAGNOSIS — I480 Paroxysmal atrial fibrillation: Secondary | ICD-10-CM | POA: Diagnosis not present

## 2018-03-03 DIAGNOSIS — R7301 Impaired fasting glucose: Secondary | ICD-10-CM | POA: Diagnosis not present

## 2018-03-09 DIAGNOSIS — M1712 Unilateral primary osteoarthritis, left knee: Secondary | ICD-10-CM | POA: Diagnosis not present

## 2018-03-09 DIAGNOSIS — M1711 Unilateral primary osteoarthritis, right knee: Secondary | ICD-10-CM | POA: Diagnosis not present

## 2018-03-13 DIAGNOSIS — H52203 Unspecified astigmatism, bilateral: Secondary | ICD-10-CM | POA: Diagnosis not present

## 2018-03-13 DIAGNOSIS — H25013 Cortical age-related cataract, bilateral: Secondary | ICD-10-CM | POA: Diagnosis not present

## 2018-03-13 DIAGNOSIS — H5203 Hypermetropia, bilateral: Secondary | ICD-10-CM | POA: Diagnosis not present

## 2018-03-13 DIAGNOSIS — H524 Presbyopia: Secondary | ICD-10-CM | POA: Diagnosis not present

## 2018-03-13 DIAGNOSIS — H2513 Age-related nuclear cataract, bilateral: Secondary | ICD-10-CM | POA: Diagnosis not present

## 2018-03-28 ENCOUNTER — Ambulatory Visit: Payer: PPO | Admitting: *Deleted

## 2018-03-28 ENCOUNTER — Encounter (INDEPENDENT_AMBULATORY_CARE_PROVIDER_SITE_OTHER): Payer: Self-pay

## 2018-03-28 DIAGNOSIS — Z5181 Encounter for therapeutic drug level monitoring: Secondary | ICD-10-CM

## 2018-03-28 DIAGNOSIS — I482 Chronic atrial fibrillation, unspecified: Secondary | ICD-10-CM

## 2018-03-28 DIAGNOSIS — Z8673 Personal history of transient ischemic attack (TIA), and cerebral infarction without residual deficits: Secondary | ICD-10-CM

## 2018-03-28 LAB — POCT INR: INR: 2.3 (ref 2.0–3.0)

## 2018-03-28 NOTE — Patient Instructions (Signed)
Description   Continue taking 2.5mg  (1/2 tablet) daily except  5mg  (1 tablet) on Tuesdays, Thursdays and Saturdays. Continue eating 2 servings each week of dark leafy green vegetable.  Recheck INR in 6 weeks.  [facility] [phone] Main [phone]

## 2018-03-29 DIAGNOSIS — Z79899 Other long term (current) drug therapy: Secondary | ICD-10-CM | POA: Diagnosis not present

## 2018-03-29 DIAGNOSIS — I129 Hypertensive chronic kidney disease with stage 1 through stage 4 chronic kidney disease, or unspecified chronic kidney disease: Secondary | ICD-10-CM | POA: Diagnosis not present

## 2018-03-29 DIAGNOSIS — M1711 Unilateral primary osteoarthritis, right knee: Secondary | ICD-10-CM | POA: Diagnosis not present

## 2018-04-06 ENCOUNTER — Ambulatory Visit
Admission: RE | Admit: 2018-04-06 | Discharge: 2018-04-06 | Disposition: A | Discharge status: Home / Self Care | Payer: PPO | Source: Ambulatory Visit | Attending: Radiation Oncology | Admitting: Radiation Oncology

## 2018-04-06 DIAGNOSIS — D329 Benign neoplasm of meninges, unspecified: Secondary | ICD-10-CM

## 2018-04-06 DIAGNOSIS — D320 Benign neoplasm of cerebral meninges: Secondary | ICD-10-CM | POA: Diagnosis not present

## 2018-04-06 MED ORDER — GADOBENATE DIMEGLUMINE 529 MG/ML IV SOLN
10.0000 mL | Freq: Once | INTRAVENOUS | Status: AC | PRN
  Administered 2018-04-06: 10 mL via INTRAVENOUS

## 2018-04-12 ENCOUNTER — Inpatient Hospital Stay: Payer: PPO | Attending: Internal Medicine | Admitting: Internal Medicine

## 2018-04-12 ENCOUNTER — Encounter: Payer: Self-pay | Admitting: Internal Medicine

## 2018-04-12 VITALS — BP 110/51 | HR 58 | Temp 97.5°F | Resp 18 | Ht 70.0 in | Wt 226.4 lb

## 2018-04-12 DIAGNOSIS — Z923 Personal history of irradiation: Secondary | ICD-10-CM | POA: Diagnosis not present

## 2018-04-12 DIAGNOSIS — Z800 Family history of malignant neoplasm of digestive organs: Secondary | ICD-10-CM | POA: Diagnosis not present

## 2018-04-12 DIAGNOSIS — Z87891 Personal history of nicotine dependence: Secondary | ICD-10-CM

## 2018-04-12 DIAGNOSIS — D320 Benign neoplasm of cerebral meninges: Secondary | ICD-10-CM | POA: Diagnosis not present

## 2018-04-12 NOTE — Progress Notes (Signed)
[facility] [place], [place] [address] [phone]   New Patient Evaluation  Date of Service: 04/12/18 Patient Name: [name] Patient MRN: [id number] Patient DOB: 02-12-1941 Provider: Ventura Sellers, MD  Identifying Statement:  [name] is a 77 y.o. male with multifocal meningioma who presents for initial consultation and evaluation.    Referring Provider: Lavone Orn, MD [address] [place], [place] [address]  Oncologic History: March 1998: Left frontal meningioma resection [name]) 12/27/17: Completes fractionated SRS to recurrent left frontal meningioma  History of Present Illness: The patient's records from the referring physician were obtained and reviewed and the patient interviewed to confirm this HPI.  [name] presents today for follow up after recent MRI brain.  He and his wife describe no new or progressive neurologic deficits.  No seizures or headaches.  He is active at home during warm weather, riding his tractor.  No apparent decline in cognition or memory.  Medications: Current Outpatient Medications on File Prior to Visit  Medication Sig Dispense Refill  . acetaminophen (TYLENOL) 500 MG tablet Take 1,000 mg by mouth every 8 (eight) hours as needed for moderate pain.    . cholecalciferol (VITAMIN D) 1000 units tablet Take 1,000 Units by mouth daily.    Marland Kitchen diltiazem (TIAZAC) 180 MG 24 hr capsule Take 180 mg by mouth daily.    Marland Kitchen lisinopril-hydrochlorothiazide (PRINZIDE,ZESTORETIC) 10-12.5 MG tablet Take 1 tablet by mouth daily.    . rosuvastatin (CRESTOR) 20 MG tablet Take 20 mg by mouth.    . vitamin B-12 (CYANOCOBALAMIN) 1000 MCG tablet Take 1,000 mcg by mouth 2 (two) times a week. Wednesday and Saturday    . warfarin (COUMADIN) 5 MG tablet Take as directed by [facility] 30 tablet 3   No current facility-administered medications on file prior to visit.      Allergies:  Allergies  Allergen Reactions  . Immune Globulins Other (See Comments)    Tetanus Immunes Globulins    (  Pt. Cannot take the Flu shot ) Guillian Barre   . Influenza Vaccines Other (See Comments)    Guillian Barre [name]  . Rho (D) Immune Globulin Other (See Comments)    Tetanus Immunes Globulins(Pt. Cannot take the Flu shot ) [name]   . Aspirin Other (See Comments)    Severe skin bruising  Severe skin bruising    Past Medical History:  Past Medical History:  Diagnosis Date  . Atrial fibrillation ([place]) 02/2016   NEW ONSET  . Brain tumor (benign) ([place]) 1998   2 small tumors- since 2015 (Dr. Saintclair Johnson following)  . Carotid artery occlusion   . Cerebrovascular disease September 09, 2001   TIA, Left brain  . CKD (chronic kidney disease), stage III ([place])   . Diverticulosis   . ED (erectile dysfunction)   . Guillain-Barre syndrome ([place]) 1999  . Hx of elevated lipids   . Hyperlipidemia   . Hypertension   . Memory loss   . Stroke [facility]) 2003   Past Surgical History:  Past Surgical History:  Procedure Laterality Date  . BRAIN SURGERY  1998   Benign brain tumor removed, Left hemisphere- ? Meningioma  . CAROTID ENDARTERECTOMY Left September 13, 2001   LEFT cea  . CHOLECYSTECTOMY  Nov. 2001  . COLON SURGERY  Feb. 2003   Colonic polyps removed endoscopically  . COLONOSCOPY WITH PROPOFOL N/A 11/11/2015   Procedure: COLONOSCOPY WITH  PROPOFOL;  Surgeon: Garlan Fair, MD;  Location: [facility];  Service: Endoscopy;  Laterality: N/A;  . INGUINAL HERNIA REPAIR  1970's   Social History:  Social History   Socioeconomic History  . Marital status: Married    Spouse name: Not on file  . Number of children: Not on file  . Years of education: Not on file  . Highest education level: Not on file  Occupational History  . Not on file  Social Needs  . Financial resource strain: Not on file  . Food insecurity:    Worry: Not on file    Inability: Not on  file  . Transportation needs:    Medical: Not on file    Non-medical: Not on file  Tobacco Use  . Smoking status: Former Smoker    Types: Cigarettes    Last attempt to quit: 05/17/1996    Years since quitting: 21.9  . Smokeless tobacco: Never Used  Substance and Sexual Activity  . Alcohol use: Yes    Alcohol/week: 1.0 standard drinks    Types: 1 Cans of beer per week    Comment: every few months  . Drug use: No  . Sexual activity: Not on file  Lifestyle  . Physical activity:    Days per week: Not on file    Minutes per session: Not on file  . Stress: Not on file  Relationships  . Social connections:    Talks on phone: Not on file    Gets together: Not on file    Attends religious service: Not on file    Active member of club or organization: Not on file    Attends meetings of clubs or organizations: Not on file    Relationship status: Not on file  . Intimate partner violence:    Fear of current or ex partner: Not on file    Emotionally abused: Not on file    Physically abused: Not on file    Forced sexual activity: Not on file  Other Topics Concern  . Not on file  Social History Narrative  . Not on file   Family History:  Family History  Problem Relation Age of Onset  . Colon cancer Mother        Metastatic   . Stroke Father   . Diabetes Father     Review of Systems: Constitutional: Denies fevers, chills or abnormal weight loss Eyes: Denies blurriness of vision Ears, nose, mouth, throat, and face: Denies mucositis or sore throat Respiratory: Denies cough, dyspnea or wheezes Cardiovascular: Denies palpitation, chest discomfort or lower extremity swelling Gastrointestinal:  Denies nausea, constipation, diarrhea GU: Denies dysuria or incontinence Skin: Denies abnormal skin rashes Neurological: Per HPI Musculoskeletal: Denies joint pain, back or neck discomfort. No decrease in ROM Behavioral/Psych: Denies anxiety, disturbance in thought content, and mood  instability  Physical Exam: Vitals:   04/12/18 1058  BP: (!) 110/51  Pulse: (!) 58  Resp: 18  Temp: (!) 97.5 F (36.4 C)  SpO2: 100%   KPS: 90. General: Alert, cooperative, pleasant, in no acute distress Head: Craniotomy scar noted, dry and intact. EENT: No conjunctival injection or scleral icterus. Oral mucosa moist Lungs: Resp effort normal Cardiac: Regular rate and rhythm Abdomen: Soft, non-distended abdomen Skin: No rashes cyanosis or petechiae. Extremities: No clubbing or edema  Neurologic Exam: Mental Status: Awake, alert, attentive to examiner. Oriented to self and environment. Language is fluent with intact comprehension.  Cranial Nerves: Visual acuity is grossly normal. Visual fields  are full. Extra-ocular movements intact. No ptosis. Face is symmetric, tongue midline. Motor: Tone and bulk are normal. Power is full in both arms and legs. Reflexes are symmetric, no pathologic reflexes present. Intact finger to nose bilaterally Sensory: Intact to light touch and temperature Gait: Normal and tandem gait is normal.   Labs: I have reviewed the data as listed    Component Value Date/Time   NA 141 04/28/2016 0650   K 4.1 04/28/2016 0650   CL 108 04/28/2016 0650   CO2 26 04/28/2016 0650   GLUCOSE 103 (H) 04/28/2016 0650   BUN 20 04/28/2016 0650   CREATININE 1.49 (H) 04/28/2016 0650   CALCIUM 8.8 (L) 04/28/2016 0650   PROT 7.5 04/15/2016 0603   ALBUMIN 2.3 (L) 04/15/2016 0603   AST 26 04/15/2016 0603   ALT 21 04/15/2016 0603   ALKPHOS 76 04/15/2016 0603   BILITOT 0.4 04/15/2016 0603   GFRNONAA 44 (L) 04/28/2016 0650   GFRAA 51 (L) 04/28/2016 0650   Lab Results  Component Value Date   WBC 10.2 04/28/2016   NEUTROABS 8.3 (H) 04/15/2016   HGB 12.2 (L) 04/28/2016   HCT 39.0 04/28/2016   MCV 88.0 04/28/2016   PLT 224 04/28/2016    Imaging: [place] Clinician Interpretation: I have personally reviewed the CNS images as listed.  My interpretation, in the context of  the patient's clinical presentation, is stable disease  Mr [name] Wo Contrast  Result Date: 04/06/2018 CLINICAL DATA:  Followup meningioma.  S RS protocol. Creatinine was obtained on site at [facility] at [address] Results: Creatinine 2.3 mg/dL. EXAM: MRI HEAD WITHOUT AND WITH CONTRAST TECHNIQUE: Multiplanar, multiecho pulse sequences of the brain and surrounding structures were obtained without and with intravenous contrast. CONTRAST:  74mL MULTIHANCE GADOBENATE DIMEGLUMINE 529 MG/ML IV SOLN COMPARISON:  10/07/2017 and multiple previous FINDINGS: Brain: Using the same measuring locations, there is no change since the previous study. Previous left frontal/parietal craniotomy as seen previously. 1) right parafalcine meningioma is stable measuring 2.6 x 2.3 x 4.5 cm. The superior sagittal sinus is filled with tumor at the parietal vertex level, but does show flow proximal and distal to that. 2) left sphenoid wing meningioma is unchanged at 2.2 x 2.2 x 1.3 cm. 3) small meningioma lateral to that in the left middle cranial fossa is unchanged at 7 mm. No new lesions. Right parietal vasogenic edema secondary to the meningioma appears similar. Degree of mass-effect upon the brain is similar. Atrophy, gliosis and encephalomalacia of the left insula and frontal operculum region is the same. Ventricular size is stable.  No evidence of subdural collection. Vascular: Chronic right ICA occlusion. Other vessels at the base of the brain show flow. Skull and upper cervical spine: Otherwise negative Sinuses/Orbits: Clear/normal Other: None IMPRESSION: Three meningiomas are stable since the study of 10/07/2017. Right parietal vertex lesion associated with some vasogenic edema, not increased. Filling of the superior sagittal sinus at this location, but with flow demonstrated proximal and distal to that segment. Electronically Signed   By: Nelson Chimes M.D.   On: 04/06/2018 14:07     Pathology:  Assessment/Plan 1. Meningioma, cerebral [facility])  Mr. Claw is clinically and radiographically stable after SRS to recurrent meningioma.  His two untreated meningiomas are also stable and asymptomatic at this time.  We appreciate the opportunity to participate in the care of [facility].   He should return to clinic in 6 months following next MRI brain, for review.  We spent ten additional minutes teaching regarding the natural history, biology, and historical experience in the treatment of brain tumors. We then discussed in detail the current recommendations for therapy focusing on the mode of administration, mechanism of action, anticipated toxicities, and quality of life issues associated with this plan. We also provided teaching sheets for the patient to take home as an additional resource.  All questions were answered. The patient knows to call the clinic with any problems, questions or concerns. No barriers to learning were detected.  The total time spent in the encounter was 45 minutes and more than 50% was on counseling and review of test results   Ventura Sellers, MD Medical Director of Neuro-Oncology [facility] at [place] 04/12/18 10:51 AM

## 2018-04-17 ENCOUNTER — Other Ambulatory Visit: Payer: Self-pay | Admitting: *Deleted

## 2018-04-17 DIAGNOSIS — D320 Benign neoplasm of cerebral meninges: Secondary | ICD-10-CM

## 2018-04-23 DIAGNOSIS — I480 Paroxysmal atrial fibrillation: Secondary | ICD-10-CM | POA: Diagnosis not present

## 2018-04-23 DIAGNOSIS — N183 Chronic kidney disease, stage 3 (moderate): Secondary | ICD-10-CM | POA: Diagnosis not present

## 2018-04-23 DIAGNOSIS — I129 Hypertensive chronic kidney disease with stage 1 through stage 4 chronic kidney disease, or unspecified chronic kidney disease: Secondary | ICD-10-CM | POA: Diagnosis not present

## 2018-04-23 DIAGNOSIS — N400 Enlarged prostate without lower urinary tract symptoms: Secondary | ICD-10-CM | POA: Diagnosis not present

## 2018-04-23 DIAGNOSIS — E782 Mixed hyperlipidemia: Secondary | ICD-10-CM | POA: Diagnosis not present

## 2018-04-23 DIAGNOSIS — M1711 Unilateral primary osteoarthritis, right knee: Secondary | ICD-10-CM | POA: Diagnosis not present

## 2018-04-25 DIAGNOSIS — M1711 Unilateral primary osteoarthritis, right knee: Secondary | ICD-10-CM | POA: Diagnosis not present

## 2018-05-02 DIAGNOSIS — L905 Scar conditions and fibrosis of skin: Secondary | ICD-10-CM | POA: Diagnosis not present

## 2018-05-02 DIAGNOSIS — D225 Melanocytic nevi of trunk: Secondary | ICD-10-CM | POA: Diagnosis not present

## 2018-05-02 DIAGNOSIS — I788 Other diseases of capillaries: Secondary | ICD-10-CM | POA: Diagnosis not present

## 2018-05-02 DIAGNOSIS — L853 Xerosis cutis: Secondary | ICD-10-CM | POA: Diagnosis not present

## 2018-05-02 DIAGNOSIS — D1801 Hemangioma of skin and subcutaneous tissue: Secondary | ICD-10-CM | POA: Diagnosis not present

## 2018-05-02 DIAGNOSIS — L821 Other seborrheic keratosis: Secondary | ICD-10-CM | POA: Diagnosis not present

## 2018-05-02 DIAGNOSIS — Z85828 Personal history of other malignant neoplasm of skin: Secondary | ICD-10-CM | POA: Diagnosis not present

## 2018-05-02 DIAGNOSIS — L918 Other hypertrophic disorders of the skin: Secondary | ICD-10-CM | POA: Diagnosis not present

## 2018-05-02 DIAGNOSIS — D692 Other nonthrombocytopenic purpura: Secondary | ICD-10-CM | POA: Diagnosis not present

## 2018-05-02 DIAGNOSIS — L570 Actinic keratosis: Secondary | ICD-10-CM | POA: Diagnosis not present

## 2018-05-04 ENCOUNTER — Other Ambulatory Visit: Payer: Self-pay

## 2018-05-04 ENCOUNTER — Emergency Department (HOSPITAL_COMMUNITY): Payer: PPO

## 2018-05-04 ENCOUNTER — Inpatient Hospital Stay (HOSPITAL_COMMUNITY)
Admission: EM | Admit: 2018-05-04 | Discharge: 2018-05-06 | DRG: 065 | Disposition: A | Discharge status: Home Health | Payer: PPO | Attending: Internal Medicine | Admitting: Internal Medicine

## 2018-05-04 ENCOUNTER — Observation Stay (HOSPITAL_COMMUNITY): Payer: PPO

## 2018-05-04 ENCOUNTER — Encounter (HOSPITAL_COMMUNITY): Payer: Self-pay

## 2018-05-04 DIAGNOSIS — I480 Paroxysmal atrial fibrillation: Secondary | ICD-10-CM | POA: Diagnosis present

## 2018-05-04 DIAGNOSIS — I63512 Cerebral infarction due to unspecified occlusion or stenosis of left middle cerebral artery: Secondary | ICD-10-CM | POA: Diagnosis not present

## 2018-05-04 DIAGNOSIS — I6389 Other cerebral infarction: Secondary | ICD-10-CM | POA: Diagnosis not present

## 2018-05-04 DIAGNOSIS — Z887 Allergy status to serum and vaccine status: Secondary | ICD-10-CM | POA: Diagnosis not present

## 2018-05-04 DIAGNOSIS — Z923 Personal history of irradiation: Secondary | ICD-10-CM

## 2018-05-04 DIAGNOSIS — G8191 Hemiplegia, unspecified affecting right dominant side: Secondary | ICD-10-CM | POA: Diagnosis not present

## 2018-05-04 DIAGNOSIS — Z79899 Other long term (current) drug therapy: Secondary | ICD-10-CM

## 2018-05-04 DIAGNOSIS — D320 Benign neoplasm of cerebral meninges: Secondary | ICD-10-CM | POA: Diagnosis not present

## 2018-05-04 DIAGNOSIS — I4821 Permanent atrial fibrillation: Secondary | ICD-10-CM | POA: Diagnosis present

## 2018-05-04 DIAGNOSIS — Z823 Family history of stroke: Secondary | ICD-10-CM

## 2018-05-04 DIAGNOSIS — I6529 Occlusion and stenosis of unspecified carotid artery: Secondary | ICD-10-CM | POA: Diagnosis not present

## 2018-05-04 DIAGNOSIS — N183 Chronic kidney disease, stage 3 unspecified: Secondary | ICD-10-CM | POA: Diagnosis present

## 2018-05-04 DIAGNOSIS — Z86011 Personal history of benign neoplasm of the brain: Secondary | ICD-10-CM

## 2018-05-04 DIAGNOSIS — G610 Guillain-Barre syndrome: Secondary | ICD-10-CM | POA: Diagnosis present

## 2018-05-04 DIAGNOSIS — Z8669 Personal history of other diseases of the nervous system and sense organs: Secondary | ICD-10-CM

## 2018-05-04 DIAGNOSIS — I10 Essential (primary) hypertension: Secondary | ICD-10-CM | POA: Diagnosis present

## 2018-05-04 DIAGNOSIS — I6350 Cerebral infarction due to unspecified occlusion or stenosis of unspecified cerebral artery: Secondary | ICD-10-CM | POA: Diagnosis not present

## 2018-05-04 DIAGNOSIS — Z7901 Long term (current) use of anticoagulants: Secondary | ICD-10-CM | POA: Diagnosis not present

## 2018-05-04 DIAGNOSIS — Z6831 Body mass index (BMI) 31.0-31.9, adult: Secondary | ICD-10-CM

## 2018-05-04 DIAGNOSIS — Z8673 Personal history of transient ischemic attack (TIA), and cerebral infarction without residual deficits: Secondary | ICD-10-CM

## 2018-05-04 DIAGNOSIS — R29898 Other symptoms and signs involving the musculoskeletal system: Secondary | ICD-10-CM

## 2018-05-04 DIAGNOSIS — I6521 Occlusion and stenosis of right carotid artery: Secondary | ICD-10-CM | POA: Diagnosis not present

## 2018-05-04 DIAGNOSIS — Z87891 Personal history of nicotine dependence: Secondary | ICD-10-CM

## 2018-05-04 DIAGNOSIS — E669 Obesity, unspecified: Secondary | ICD-10-CM | POA: Diagnosis not present

## 2018-05-04 DIAGNOSIS — R739 Hyperglycemia, unspecified: Secondary | ICD-10-CM | POA: Diagnosis present

## 2018-05-04 DIAGNOSIS — I63513 Cerebral infarction due to unspecified occlusion or stenosis of bilateral middle cerebral arteries: Secondary | ICD-10-CM | POA: Diagnosis not present

## 2018-05-04 DIAGNOSIS — E875 Hyperkalemia: Secondary | ICD-10-CM | POA: Diagnosis not present

## 2018-05-04 DIAGNOSIS — Z86018 Personal history of other benign neoplasm: Secondary | ICD-10-CM | POA: Diagnosis not present

## 2018-05-04 DIAGNOSIS — I4891 Unspecified atrial fibrillation: Secondary | ICD-10-CM | POA: Diagnosis present

## 2018-05-04 DIAGNOSIS — R29702 NIHSS score 2: Secondary | ICD-10-CM | POA: Diagnosis present

## 2018-05-04 DIAGNOSIS — B359 Dermatophytosis, unspecified: Secondary | ICD-10-CM | POA: Diagnosis not present

## 2018-05-04 DIAGNOSIS — I129 Hypertensive chronic kidney disease with stage 1 through stage 4 chronic kidney disease, or unspecified chronic kidney disease: Secondary | ICD-10-CM | POA: Diagnosis not present

## 2018-05-04 DIAGNOSIS — E785 Hyperlipidemia, unspecified: Secondary | ICD-10-CM | POA: Diagnosis present

## 2018-05-04 DIAGNOSIS — I639 Cerebral infarction, unspecified: Secondary | ICD-10-CM | POA: Diagnosis present

## 2018-05-04 DIAGNOSIS — Z886 Allergy status to analgesic agent status: Secondary | ICD-10-CM | POA: Diagnosis not present

## 2018-05-04 DIAGNOSIS — R531 Weakness: Secondary | ICD-10-CM | POA: Diagnosis not present

## 2018-05-04 DIAGNOSIS — D72829 Elevated white blood cell count, unspecified: Secondary | ICD-10-CM | POA: Diagnosis not present

## 2018-05-04 DIAGNOSIS — R200 Anesthesia of skin: Secondary | ICD-10-CM | POA: Diagnosis not present

## 2018-05-04 DIAGNOSIS — I6522 Occlusion and stenosis of left carotid artery: Secondary | ICD-10-CM | POA: Diagnosis not present

## 2018-05-04 DIAGNOSIS — I6340 Cerebral infarction due to embolism of unspecified cerebral artery: Secondary | ICD-10-CM | POA: Diagnosis not present

## 2018-05-04 DIAGNOSIS — I482 Chronic atrial fibrillation, unspecified: Secondary | ICD-10-CM | POA: Diagnosis not present

## 2018-05-04 DIAGNOSIS — I6523 Occlusion and stenosis of bilateral carotid arteries: Secondary | ICD-10-CM | POA: Diagnosis not present

## 2018-05-04 LAB — I-STAT CHEM 8, ED
BUN: 40 mg/dL — ABNORMAL HIGH (ref 8–23)
Calcium, Ion: 1.04 mmol/L — ABNORMAL LOW (ref 1.15–1.40)
Chloride: 109 mmol/L (ref 98–111)
Creatinine, Ser: 1.7 mg/dL — ABNORMAL HIGH (ref 0.61–1.24)
Glucose, Bld: 99 mg/dL (ref 70–99)
HCT: 52 % (ref 39.0–52.0)
Hemoglobin: 17.7 g/dL — ABNORMAL HIGH (ref 13.0–17.0)
Potassium: 5.7 mmol/L — ABNORMAL HIGH (ref 3.5–5.1)
Sodium: 138 mmol/L (ref 135–145)
TCO2: 28 mmol/L (ref 22–32)

## 2018-05-04 LAB — COMPREHENSIVE METABOLIC PANEL
ALT: 28 U/L (ref 0–44)
AST: 23 U/L (ref 15–41)
Albumin: 4 g/dL (ref 3.5–5.0)
Alkaline Phosphatase: 80 U/L (ref 38–126)
Anion gap: 10 (ref 5–15)
BUN: 28 mg/dL — ABNORMAL HIGH (ref 8–23)
CALCIUM: 9.1 mg/dL (ref 8.9–10.3)
CO2: 26 mmol/L (ref 22–32)
Chloride: 106 mmol/L (ref 98–111)
Creatinine, Ser: 1.71 mg/dL — ABNORMAL HIGH (ref 0.61–1.24)
GFR calc non Af Amer: 38 mL/min — ABNORMAL LOW (ref >60)
GFR, EST AFRICAN AMERICAN: 44 mL/min — AB (ref >60)
Glucose, Bld: 104 mg/dL — ABNORMAL HIGH (ref 70–99)
Potassium: 4.6 mmol/L (ref 3.5–5.1)
Sodium: 142 mmol/L (ref 135–145)
Total Bilirubin: 1.6 mg/dL — ABNORMAL HIGH (ref 0.3–1.2)
Total Protein: 6.7 g/dL (ref 6.5–8.1)

## 2018-05-04 LAB — DIFFERENTIAL
Abs Immature Granulocytes: 0.09 10*3/uL — ABNORMAL HIGH (ref 0.00–0.07)
Basophils Absolute: 0 10*3/uL (ref 0.0–0.1)
Basophils Relative: 0 %
Eosinophils Absolute: 0.1 10*3/uL (ref 0.0–0.5)
Eosinophils Relative: 1 %
Immature Granulocytes: 1 %
Lymphocytes Relative: 16 %
Lymphs Abs: 2 10*3/uL (ref 0.7–4.0)
MONO ABS: 1.2 10*3/uL — AB (ref 0.1–1.0)
Monocytes Relative: 10 %
Neutro Abs: 9 10*3/uL — ABNORMAL HIGH (ref 1.7–7.7)
Neutrophils Relative %: 72 %

## 2018-05-04 LAB — RAPID URINE DRUG SCREEN, HOSP PERFORMED
Amphetamines: NOT DETECTED
Barbiturates: NOT DETECTED
Benzodiazepines: NOT DETECTED
Cocaine: NOT DETECTED
Opiates: NOT DETECTED
Tetrahydrocannabinol: NOT DETECTED

## 2018-05-04 LAB — URINALYSIS, ROUTINE W REFLEX MICROSCOPIC
Bilirubin Urine: NEGATIVE
Glucose, UA: NEGATIVE mg/dL
HGB URINE DIPSTICK: NEGATIVE
Ketones, ur: NEGATIVE mg/dL
LEUKOCYTES UA: NEGATIVE
NITRITE: NEGATIVE
PROTEIN: NEGATIVE mg/dL
Specific Gravity, Urine: 1.017 (ref 1.005–1.030)
pH: 5 (ref 5.0–8.0)

## 2018-05-04 LAB — I-STAT TROPONIN, ED: Troponin i, poc: 0.01 ng/mL (ref 0.00–0.08)

## 2018-05-04 LAB — CBC
HCT: 53.9 % — ABNORMAL HIGH (ref 39.0–52.0)
Hemoglobin: 16.7 g/dL (ref 13.0–17.0)
MCH: 29.7 pg (ref 26.0–34.0)
MCHC: 31 g/dL (ref 30.0–36.0)
MCV: 95.9 fL (ref 80.0–100.0)
Platelets: 162 10*3/uL (ref 150–400)
RBC: 5.62 MIL/uL (ref 4.22–5.81)
RDW: 14.1 % (ref 11.5–15.5)
WBC: 12.4 10*3/uL — ABNORMAL HIGH (ref 4.0–10.5)
nRBC: 0 % (ref 0.0–0.2)

## 2018-05-04 LAB — TROPONIN I: Troponin I: <0.03 ng/mL (ref <0.03)

## 2018-05-04 LAB — PROTIME-INR
INR: 2.02
Prothrombin Time: 22.6 seconds — ABNORMAL HIGH (ref 11.4–15.2)

## 2018-05-04 LAB — CBG MONITORING, ED: GLUCOSE-CAPILLARY: 95 mg/dL (ref 70–99)

## 2018-05-04 LAB — APTT: aPTT: 33 seconds (ref 24–36)

## 2018-05-04 LAB — ETHANOL: Alcohol, Ethyl (B): <10 mg/dL (ref <10)

## 2018-05-04 MED ORDER — SENNOSIDES-DOCUSATE SODIUM 8.6-50 MG PO TABS: 1.0000 | ORAL_TABLET | Freq: Every evening | ORAL | Status: DC | PRN

## 2018-05-04 MED ORDER — ACETAMINOPHEN 500 MG PO TABS: 1000.0000 mg | ORAL_TABLET | Freq: Three times a day (TID) | ORAL | Status: DC | PRN

## 2018-05-04 MED ORDER — ROSUVASTATIN CALCIUM 20 MG PO TABS
20.0000 mg | ORAL_TABLET | Freq: Every evening | ORAL | Status: DC
  Administered 2018-05-04 – 2018-05-05 (×2): 20 mg via ORAL
  Filled 2018-05-04 (×2): qty 1

## 2018-05-04 MED ORDER — GADOBUTROL 1 MMOL/ML IV SOLN
10.0000 mL | Freq: Once | INTRAVENOUS | Status: AC | PRN
  Administered 2018-05-04: 10 mL via INTRAVENOUS

## 2018-05-04 MED ORDER — VITAMIN B-12 1000 MCG PO TABS
1000.0000 ug | ORAL_TABLET | ORAL | Status: DC
  Administered 2018-05-06: 1000 ug via ORAL
  Filled 2018-05-04: qty 1

## 2018-05-04 MED ORDER — WARFARIN SODIUM 5 MG PO TABS
5.0000 mg | ORAL_TABLET | Freq: Once | ORAL | Status: AC
  Administered 2018-05-04: 5 mg via ORAL
  Filled 2018-05-04 (×2): qty 1

## 2018-05-04 MED ORDER — VITAMIN D 25 MCG (1000 UNIT) PO TABS
1000.0000 [IU] | ORAL_TABLET | Freq: Every day | ORAL | Status: DC
  Administered 2018-05-04 – 2018-05-06 (×3): 1000 [IU] via ORAL
  Filled 2018-05-04 (×3): qty 1

## 2018-05-04 MED ORDER — WARFARIN - PHARMACIST DOSING INPATIENT: Freq: Every day | Status: DC

## 2018-05-04 MED ORDER — STROKE: EARLY STAGES OF RECOVERY BOOK
Freq: Once | Status: AC
  Administered 2018-05-04: 21:00:00
  Filled 2018-05-04: qty 1

## 2018-05-04 MED ORDER — DILTIAZEM HCL ER 180 MG PO CP24
180.0000 mg | ORAL_CAPSULE | Freq: Every day | ORAL | Status: DC
  Administered 2018-05-05 – 2018-05-06 (×2): 180 mg via ORAL
  Filled 2018-05-04 (×5): qty 1

## 2018-05-04 NOTE — ED Notes (Addendum)
Tele at bedside

## 2018-05-04 NOTE — Progress Notes (Signed)
ANTICOAGULATION CONSULT NOTE - Initial Consult  Pharmacy Consult for warfarin Indication: atrial fibrillation and stroke  Allergies  Allergen Reactions  . Immune Globulins Other (See Comments)    Tetanus Immunes Globulins    (  Pt. Cannot take the Flu shot ) Guillian Barre   . Influenza Vaccines Other (See Comments)    Guillian Barre [name]  . Rho (D) Immune Globulin Other (See Comments)    Tetanus Immunes Globulins(Pt. Cannot take the Flu shot ) [name]   . Aspirin Other (See Comments)    Severe skin bruising  Severe skin bruising     Patient Measurements: Height: 5\' 10"  (177.8 cm) Weight: 220 lb (99.8 kg) IBW/kg (Calculated) : 73  Vital Signs: Temp: 97.4 F (36.3 C) (12/19 0947) Temp Source: Oral (12/19 0947) BP: 123/59 (12/19 1337) Pulse Rate: 58 (12/19 1337)  Labs: Recent Labs    05/04/18 1007 05/04/18 1009  HGB 16.7 17.7*  HCT 53.9* 52.0  PLT 162  --   APTT 33  --   LABPROT 22.6*  --   INR 2.02  --   CREATININE 1.71* 1.70*    Estimated Creatinine Clearance: 43.1 mL/min (A) (by C-G formula based on SCr of 1.7 mg/dL (H)).   Medical History: Past Medical History:  Diagnosis Date  . Atrial fibrillation ([place]) 02/2016   NEW ONSET  . Brain tumor (benign) ([place]) 1998   2 small tumors- since 2015 (Dr. [name] following)  . Carotid artery occlusion   . Cerebrovascular disease September 09, 2001   TIA, Left brain  . CKD (chronic kidney disease), stage III ([place])   . Diverticulosis   . ED (erectile dysfunction)   . Guillain-Barre syndrome ([place]) 1999  . Hx of elevated lipids   . Hyperlipidemia   . Hypertension   . Memory loss   . Stroke [facility]) 2003    Medications:  Scheduled:  Infusions:   Assessment: 77 yo male presented to ER with right arm weakness and facial tingling this AM that is now resolved, CVA confirmed per head CT. Plan to transfer to [facility], neuro recommended to continue warfarin per Rx dosing. Patient reportedly takes warfarin  PTA at dosing of 2.5mg  daily except 5mg  TuThSat for hx Afib, hx CVA. INR therapeutic on presentation to ER. CBC OK. No reported bleeding per head CT. Last reported dose of warfarin per med history was 12/18 at 1800.   Goal of Therapy:  INR 2-3   Plan:  1) Continue with patient's home regimen as above - will get 5mg  this PM at 1800 2) Daily INR 3) Monitor for signs/symptoms of bleeding   Adrian Saran, PharmD, BCPS Pager [phone] 05/04/2018 1:45 PM

## 2018-05-04 NOTE — ED Notes (Signed)
Patient unable to void at this time

## 2018-05-04 NOTE — ED Notes (Signed)
Attempted to give report. RN is busy and will call back. Bed is in the process of being clean.

## 2018-05-04 NOTE — ED Notes (Signed)
Transport has arrived.

## 2018-05-04 NOTE — Progress Notes (Signed)
Pt transported to [facility].

## 2018-05-04 NOTE — Progress Notes (Signed)
scd's ordered from equipment

## 2018-05-04 NOTE — ED Notes (Addendum)
Tele neuro over. MRI has been called. 30-45 min wait time for MRI.

## 2018-05-04 NOTE — ED Notes (Signed)
Patient transported to MRI 

## 2018-05-04 NOTE — ED Notes (Signed)
Patient back from MRI.

## 2018-05-04 NOTE — ED Notes (Signed)
Pt unable to produce urine specimen at this time,tech will try to collect specimen again at a later time.

## 2018-05-04 NOTE — Consult Note (Signed)
[facility] TeleNeurology Consult Services   Date of Service:   05/04/2018 10:28:10  Impression:     .  RO Acute Ischemic Stroke  Comments: 77 year old male with extensive history who presents with an episode of right side tingling and weakness. Presentation may be due to small stroke or TIA vs seizure.  Mechanism of Stroke: Not Clear  Metrics: Last Known Well: 05/04/2018 07:00:00 [facility] Notification Time: 05/04/2018 10:28:10 Arrival Time: 05/04/2018 09:40:00 Stamp Time: 05/04/2018 10:28:10 Time First Login Attempt: 05/04/2018 10:31:00 Video Start Time: 05/04/2018 10:31:00  Symptoms: Right side tingling NIHSS Start Assessment Time: 05/04/2018 10:35:32 Patient is not a candidate for tPA. Patient was not deemed candidate for tPA thrombolytics because of Patient back to baseline. . Video End Time: 05/04/2018 10:48:00  CT head was reviewed.  Advanced imaging was not obtained as the presentation was not suggestive of Large Vessel Occlusive Disease.   ER Physician notified of the decision on thrombolytics management on 05/04/2018 10:50:00  Our recommendations are outlined below.  Recommendations:     .  Activate Stroke Protocol Admission/Order Set     .  Stroke/Telemetry Floor     .  Neuro Checks     .  Bedside Swallow Eval     .  DVT Prophylaxis     .  IV Fluids, Normal Saline     .  Head of Bed Below 30 Degrees     .  Euglycemia and Avoid Hyperthermia (PRN Acetaminophen)     .  Antiplatelet Therapy Recommended     .  -Consider routine EEG either as inpatient or as an outpatient if no acute findings on MRI Brain.  Routine Consultation with [facility] for Follow up Care  Sign Out:     .  Discussed with Emergency Department Provider    ------------------------------------------------------------------------------  History of Present Illness: Patient is a 77 year old Male.  Patient was brought by private transportation with  symptoms of Right side tingling  77 year old male with extensive history of GBS, previous brain tumors s/p radiation, and multiple meningiomas and a single focal motor seizure several years ago as well as atria fibrillation on Coumadin who presents to the ED because of an episode of tingling in the right arm and leg which lasted for approximately 1 hour. Patient was able to move his right side but had difficulty with fine motor skills.  CT head was reviewed.    Examination: 1A: Level of Consciousness - Alert; keenly responsive + 0 1B: Ask Month and Age - Both Questions Right + 0 1C: Blink Eyes & Squeeze Hands - Performs Both Tasks + 0 2: Test Horizontal Extraocular Movements - Normal + 0 3: Test Visual Fields - No Visual Loss + 0 4: Test Facial Palsy (Use Grimace if Obtunded) - Minor paralysis (flat nasolabial fold, smile asymetry) + 1 5A: Test Left Arm Motor Drift - No Drift for 10 Seconds + 0 5B: Test Right Arm Motor Drift - No Drift for 10 Seconds + 0 6A: Test Left Leg Motor Drift - No Drift for 5 Seconds + 0 6B: Test Right Leg Motor Drift - No Drift for 5 Seconds + 0 7: Test Limb Ataxia (FNF/Heel-Shin) - No Ataxia + 0 8: Test Sensation - Normal; No sensory loss + 0 9: Test Language/Aphasia - Mild-Moderate Aphasia: Some Obvious Changes, Without Significant Limitation + 1 10: Test Dysarthria - Normal + 0 11: Test Extinction/Inattention - No abnormality + 0  NIHSS Score: 2  Patient was informed the Neurology Consult would happen via TeleHealth consult by way of interactive audio and video telecommunications and consented to receiving care in this manner.  Due to the immediate potential for life-threatening deterioration due to underlying acute neurologic illness, I spent 35 minutes providing critical care. This time includes time for face to face visit via telemedicine, review of medical records, imaging studies and discussion of findings with providers, the patient and/or  family.   Dr Tsosie Billing   [facility] [phone]  Case [id number]

## 2018-05-04 NOTE — ED Notes (Signed)
Report given to [facility].

## 2018-05-04 NOTE — ED Triage Notes (Addendum)
Patient woke up at 0730 and was fine. Went to the bathroom. 0830 patient reports his mouth feeling funny and trouble raising right arm lasting about 1 hour.  Patient now denies any trouble raising right arm and denies funny feeling lip. Patient feels back to normal.   Hx. Strokes, a.fib (coum).   A/Ox4

## 2018-05-04 NOTE — ED Notes (Signed)
Patient transported to CT 

## 2018-05-04 NOTE — ED Notes (Signed)
[facility] has been called.  

## 2018-05-04 NOTE — ED Notes (Signed)
Patient back from CT. Patient speaking with tele neuro.

## 2018-05-04 NOTE — ED Provider Notes (Signed)
[facility] DEPT Provider Note   CSN: [id number] Arrival date & time: 05/04/18  0940     History   Chief Complaint Chief Complaint  Patient presents with  . Extremity Weakness    HPI [name] is a 77 y.o. male history of atrial fibrillation on Coumadin, multiple meningiomas s/p radiation in August this year, CKD, previous Guillain-Barre, here presenting with paresthesias around the mouth, right arm weakness.  Patient states that he woke up around 7:30 AM this morning.  He was feeling well and went to the restroom and then went back to bed.  Around 8:30 AM, the wife called him and he then felt some funny sensation in the right side of his mouth.  She tried to get him dressed and he had trouble raising up the right arm.  He states that his numbness and right arm weakness has resolved.  Of note, patient does have multiple meningiomas.  He did follow-up with radiation oncologist and had a radiation treatment in August.  He also had an MRI about a month ago that showed the meningioma was stable.  Patient is currently on Coumadin for atrial fibrillation.  The history is provided by the patient.    Past Medical History:  Diagnosis Date  . Atrial fibrillation ([place]) 02/2016   NEW ONSET  . Brain tumor (benign) ([employer]) 1998   2 small tumors- since 2015 (Dr. [name] following)  . Carotid artery occlusion   . Cerebrovascular disease September 09, 2001   TIA, Left brain  . CKD (chronic kidney disease), stage III ([place])   . Diverticulosis   . ED (erectile dysfunction)   . Guillain-Barre syndrome ([place]) 1999  . Hx of elevated lipids   . Hyperlipidemia   . Hypertension   . Memory loss   . Stroke [facility]) 2003    Patient Active Problem List   Diagnosis Date Noted  . Encounter for therapeutic drug monitoring 01/11/2017  . Meningioma, cerebral ([place]) 12/18/2016  . GBS (Guillain Barre syndrome) ([place]) 04/14/2016  . Neuropathic pain   . Leukocytosis   . Chronic  anticoagulation   . Benign essential HTN   . Stage 3 chronic kidney disease ([place])   . PAF (paroxysmal atrial fibrillation) ([employer])   . History of meningioma   . Urinary retention   . History of Guillain-Barre syndrome   . Weakness of both lower extremities   . Leg weakness 04/04/2016  . Guillain Barr syndrome ([place]) 04/04/2016  . Cellulitis of left knee 03/12/2016  . Atrial fibrillation, new onset ([place]) 03/07/2016  . Urinary tract infection without hematuria 03/07/2016  . Renal insufficiency 03/07/2016  . Hypokalemia 03/07/2016  . History of CVA (cerebrovascular accident) 03/07/2016  . HTN (hypertension) 03/07/2016  . Atrial fibrillation ([place]) 03/07/2016  . Acute urinary retention   . Occlusion and stenosis of carotid artery without mention of cerebral infarction 07/11/2012  . Carotid stenosis 07/11/2012    Past Surgical History:  Procedure Laterality Date  . BRAIN SURGERY  1998   Benign brain tumor removed, Left hemisphere- ? Meningioma  . CAROTID ENDARTERECTOMY Left September 13, 2001   LEFT cea  . CHOLECYSTECTOMY  Nov. 2001  . COLON SURGERY  Feb. 2003   Colonic polyps removed endoscopically  . COLONOSCOPY WITH PROPOFOL N/A 11/11/2015   Procedure: COLONOSCOPY WITH PROPOFOL;  Surgeon: Garlan Fair, MD;  Location: [facility] ENDOSCOPY;  Service: Endoscopy;  Laterality: N/A;  . INGUINAL HERNIA REPAIR  1970's  Home Medications    Prior to Admission medications   Medication Sig Start Date End Date Taking? Authorizing Provider  acetaminophen (TYLENOL) 500 MG tablet Take 1,000 mg by mouth every 8 (eight) hours as needed for moderate pain.   Yes [provider]  cholecalciferol (VITAMIN D) 1000 units tablet Take 1,000 Units by mouth daily.   Yes [provider]  DILT-XR 180 MG 24 hr capsule Take 180 mg by mouth daily after breakfast.  03/27/18  Yes [provider]  lisinopril-hydrochlorothiazide (PRINZIDE,ZESTORETIC) 10-12.5 MG tablet Take 1 tablet by  mouth daily.   Yes [provider]  rosuvastatin (CRESTOR) 20 MG tablet Take 20 mg by mouth every evening.    Yes [provider]  vitamin B-12 (CYANOCOBALAMIN) 1000 MCG tablet Take 1,000 mcg by mouth 2 (two) times a week. Wednesday and Saturday   Yes [provider]  warfarin (COUMADIN) 5 MG tablet Take as directed by [facility] Patient taking differently: Take 2.5-5 mg by mouth See admin instructions. Take 5 mg  tues, thurs, sat, and 2.5 mg all other days. 12/21/17  Yes Camnitz, Ocie Doyne, MD    Family History Family History  Problem Relation Age of Onset  . Colon cancer Mother        Metastatic   . Stroke Father   . Diabetes Father     Social History Social History   Tobacco Use  . Smoking status: Former Smoker    Types: Cigarettes    Last attempt to quit: 05/17/1996    Years since quitting: 21.9  . Smokeless tobacco: Never Used  Substance Use Topics  . Alcohol use: Yes    Alcohol/week: 1.0 standard drinks    Types: 1 Cans of beer per week    Comment: every few months  . Drug use: No     Allergies   Immune globulins; Influenza vaccines; Rho (d) immune globulin; and Aspirin   Review of Systems Review of Systems  Musculoskeletal: Positive for extremity weakness.  Neurological: Positive for weakness and numbness.  All other systems reviewed and are negative.    Physical Exam Updated Vital Signs BP (!) 118/52   Pulse (!) 58   Temp (!) 97.4 F (36.3 C) (Oral)   Resp 15   Ht 5\' 10"  (1.778 m)   Wt 99.8 kg   SpO2 96%   BMI 31.57 kg/m   Physical Exam Vitals signs and nursing note reviewed.  Constitutional:      Appearance: Normal appearance.  HENT:     Head: Normocephalic.     Nose: Nose normal.     Mouth/Throat:     Mouth: Mucous membranes are moist.  Eyes:     Extraocular Movements: Extraocular movements intact.     Pupils: Pupils are equal, round, and reactive to light.  Neck:     Musculoskeletal: Normal range of  motion.  Cardiovascular:     Rate and Rhythm: Normal rate.     Pulses: Normal pulses.     Heart sounds: Normal heart sounds.  Pulmonary:     Effort: Pulmonary effort is normal.     Breath sounds: Normal breath sounds.  Abdominal:     General: Abdomen is flat.  Musculoskeletal: Normal range of motion.  Skin:    General: Skin is warm.     Capillary Refill: Capillary refill takes less than 2 seconds.  Neurological:     Mental Status: He is alert.     Comments: ? R facial droop (unchanged  per wife). Nl strength bilateral upper and lower extremities. Nl finger to nose bilaterally, nl sensation throughout   Psychiatric:        Mood and Affect: Mood normal.        Behavior: Behavior normal.      ED Treatments / Results  Labs (all labs ordered are listed, but only abnormal results are displayed) Labs Reviewed  PROTIME-INR - Abnormal; Notable for the following components:      Result Value   Prothrombin Time 22.6 (*)    All other components within normal limits  CBC - Abnormal; Notable for the following components:   WBC 12.4 (*)    HCT 53.9 (*)    All other components within normal limits  DIFFERENTIAL - Abnormal; Notable for the following components:   Neutro Abs 9.0 (*)    Monocytes Absolute 1.2 (*)    Abs Immature Granulocytes 0.09 (*)    All other components within normal limits  COMPREHENSIVE METABOLIC PANEL - Abnormal; Notable for the following components:   Glucose, Bld 104 (*)    BUN 28 (*)    Creatinine, Ser 1.71 (*)    Total Bilirubin 1.6 (*)    GFR calc non Af Amer 38 (*)    GFR calc Af Amer 44 (*)    All other components within normal limits  I-STAT CHEM 8, ED - Abnormal; Notable for the following components:   Potassium 5.7 (*)    BUN 40 (*)    Creatinine, Ser 1.70 (*)    Calcium, Ion 1.04 (*)    Hemoglobin 17.7 (*)    All other components within normal limits  ETHANOL  APTT  URINALYSIS, ROUTINE W REFLEX MICROSCOPIC  RAPID URINE DRUG SCREEN, HOSP  PERFORMED  CBG MONITORING, ED  I-STAT TROPONIN, ED    EKG EKG Interpretation  Date/Time:  Thursday May 04 2018 10:09:54 EST Ventricular Rate:  63 PR Interval:    QRS Duration: 99 QT Interval:  398 QTC Calculation: 408 R Axis:   -31 Text Interpretation:  Sinus rhythm Left axis deviation No significant change since last tracing Confirmed by Wandra Arthurs ([id number]) on 05/04/2018 10:46:37 AM   Radiology Mr Brain W And Wo Contrast  Result Date: 05/04/2018 CLINICAL DATA:  Right arm weakness.  History of meningiomas. EXAM: MRI HEAD WITHOUT AND WITH CONTRAST TECHNIQUE: Multiplanar, multiecho pulse sequences of the brain and surrounding structures were obtained without and with intravenous contrast. CONTRAST:  10 mL Gadavist COMPARISON:  Head CT 05/04/2018 and MRI 04/06/2018 FINDINGS: Brain: There are multiple small acute cortical and subcortical infarcts in the left parietal and posterior left frontal lobes. There is also a small acute infarct in the posterior right frontal lobe white matter at the level of the centrum semiovale. No acute intracranial hemorrhage, midline shift, or extra-axial fluid collection is identified. There is mild cerebral atrophy. Extensive encephalomalacia and gliosis involving the left frontal lobe, insula, and superior left temporal lobe are unchanged from the prior MRI. 2.6 x 2.3 x 4.5 cm right parafalcine extra-axial mass invading the superior sagittal sinus is unchanged from the prior MRI with the sinus appearing patent more proximally and distally. Associated mild edema in the posterior right frontal lobe is unchanged. 2.1 x 2.1 x 1.3 cm extra-axial mass along the left anterior clinoid process and 7 mm mass laterally in the left middle cranial fossa are also unchanged. No new enhancing intracranial lesion is identified. Vascular: Chronic right ICA occlusion. Skull and upper cervical spine: Left-sided craniectomy.  Sinuses/Orbits: Unremarkable orbits. Clear paranasal  sinuses. Small right mastoid effusion. Other: None. IMPRESSION: 1. Small acute posterior left MCA/watershed infarcts. 2. Small acute infarct in the right centrum semiovale. 3. Stable postoperative changes and 3 meningiomas as detailed above. Electronically Signed   By: Logan Bores M.D.   On: 05/04/2018 12:28   Ct Head Code Stroke Wo Contrast  Result Date: 05/04/2018 CLINICAL DATA:  Code stroke.  Right arm weakness EXAM: CT HEAD WITHOUT CONTRAST TECHNIQUE: Contiguous axial images were obtained from the base of the skull through the vertex without intravenous contrast. COMPARISON:  04/06/2018 brain MRI FINDINGS: Brain: Known meningiomas along the left anterior clinoid and parasagittal right frontal parietal convexity. These were recently characterized with brain MRI. Vasogenic edema in the right posterior frontal white matter is unchanged and mild. Extensive encephalomalacia in the left frontal lobe, insula, and superior temporal lobe deep to a craniotomy and cranioplasty that could be post-ischemic, compressive, or postoperative. There is no evidence of superimposed infarct or hemorrhage. No hydrocephalus. Vascular: No hyperdense vessel Skull: Cranioplasty on the left that is unremarkable Sinuses/Orbits: Negative Other: These results were called by telephone at the time of interpretation on 05/04/2018 at 10:44 am to Dr. Shirlyn Goltz , who verbally acknowledged these results. ASPECTS [facility] Stroke Program Early CT Score) Not scored given the extent of chronic change. IMPRESSION: 1. No acute finding. 2. Known intracranial meningiomas with stable mild vasogenic edema at the right frontal parietal convexity. 3. Extensive left cerebral encephalomalacia. Electronically Signed   By: Monte Fantasia M.D.   On: 05/04/2018 10:47    Procedures Procedures (including critical care time)  Medications Ordered in ED Medications  gadobutrol (GADAVIST) 1 MMOL/ML injection 10 mL (10 mLs Intravenous Contrast Given 05/04/18  1157)     Initial Impression / Assessment and Plan / ED Course  I have reviewed the triage vital signs and the nursing notes.  Pertinent labs & imaging results that were available during my care of the patient were reviewed by me and considered in my medical decision making (see chart for details).    Jermond Zollie Ellery is a 77 y.o. male here with R arm weakness, R lip paresthesias that resolved. Patient is in TPA window but symptoms resolved and patient is on coumadin and had recent brain radiation for brain tumor. For those reasons, code stroke not activated. Will consult teleneurology and get CT head, MRI brain w/wo.   11 AM I talked to Teleneurology who states that it could be small stroke vs seizure. Agreed with no TPA. Agreed with MRI and if negative, consider EEG.   1:07 PM MRI showed L MCA stroke and R centrum semiovale stroke. Dr. Lorraine Lax from neurology aware and will have stroke team see patient. Hospitalist to admit and transfer to [facility].    Final Clinical Impressions(s) / ED Diagnoses   Final diagnoses:  None    ED Discharge Orders    None       Drenda Freeze, MD 05/04/18 1308

## 2018-05-04 NOTE — H&P (Signed)
History and Physical    [name] LFY:[id number] DOB: Sep 02, 1940 DOA: 05/04/2018  PCP: Lavone Orn, MD   Patient coming from:   Chief Complaint: Right arm weakness and facial tingling  HPI: [name] is a 77 y.o. male with medical history significant of accessible atrial fibrillation secondary to his Guyon Barr syndrome hospitalization, history of meningiomas, history of carotid artery occlusion, history of CVA, CKD stage III, hypertension, hyperlipidemia, history of memory loss, diverticulosis and other comorbidities presented with right arm weakness and facial tingling.  Patient woke up this morning 730 and was feeling fine and however after an hour he states that his arm felt funny around 815 and states he is not able to lift it.  Patient states his face also started feeling funny.  When wife went to evaluate the patient she had a hard time getting him dressed as he had trouble raising his right arm and associated numbness in his right arm as well as weakness.  This is now resolved and because of concern for CVA is brought in for further evaluation.  He currently denies any lightheadedness, dizziness, headaches, chest pain shortness of breath, nausea or vomiting.  Denies any bowel or bladder trouble.  Work-up in the ED confirmed that he had a a few acute strokes so [facility] was called to admit this patient for acute CVA.  ED Course: The ED had a head CT done and tele-neurology was called.  Patient also then underwent an MRI which confirmed CVA.  Basic blood work done and neurology was consulted and recommended transfer to [facility]: As per HPI otherwise 10 point review of systems negative.   Past Medical History:  Diagnosis Date  . Atrial fibrillation ([place]) 02/2016   NEW ONSET  . Brain tumor (benign) ([place]) 1998   2 small tumors- since 2015 (Dr. [name] following)  . Carotid artery occlusion   . Cerebrovascular disease September 09, 2001   TIA, Left brain  .  CKD (chronic kidney disease), stage III ([place])   . Diverticulosis   . ED (erectile dysfunction)   . Guillain-Barre syndrome ([place]) 1999  . Hx of elevated lipids   . Hyperlipidemia   . Hypertension   . Memory loss   . Stroke [facility]) 2003   Past Surgical History:  Procedure Laterality Date  . BRAIN SURGERY  1998   Benign brain tumor removed, Left hemisphere- ? Meningioma  . CAROTID ENDARTERECTOMY Left September 13, 2001   LEFT cea  . CHOLECYSTECTOMY  Nov. 2001  . COLON SURGERY  Feb. 2003   Colonic polyps removed endoscopically  . COLONOSCOPY WITH PROPOFOL N/A 11/11/2015   Procedure: COLONOSCOPY WITH PROPOFOL;  Surgeon: Garlan Fair, MD;  Location: [facility] ENDOSCOPY;  Service: Endoscopy;  Laterality: N/A;  . INGUINAL HERNIA REPAIR  1970's   SOCIAL HISTORY  reports that he quit smoking about 21 years ago. His smoking use included cigarettes. He has never used smokeless tobacco. He reports current alcohol use of about 1.0 standard drinks of alcohol per week. He reports that he does not use drugs.  Allergies  Allergen Reactions  . Immune Globulins Other (See Comments)    Tetanus Immunes Globulins    (  Pt. Cannot take the Flu shot ) Guillian Barre   . Influenza Vaccines Other (See Comments)    Guillian Barre [name]  . Rho (D) Immune Globulin Other (See Comments)    Tetanus Immunes Globulins(Pt. Cannot take the Flu shot ) Guillian  Barre   . Aspirin Other (See Comments)    Severe skin bruising  Severe skin bruising     Family History  Problem Relation Age of Onset  . Colon cancer Mother        Metastatic   . Stroke Father   . Diabetes Father    Prior to Admission medications   Medication Sig Start Date End Date Taking? Authorizing Provider  acetaminophen (TYLENOL) 500 MG tablet Take 1,000 mg by mouth every 8 (eight) hours as needed for moderate pain.   Yes [provider]  cholecalciferol (VITAMIN D) 1000 units tablet Take 1,000 Units by mouth daily.   Yes  [provider]  DILT-XR 180 MG 24 hr capsule Take 180 mg by mouth daily after breakfast.  03/27/18  Yes [provider]  lisinopril-hydrochlorothiazide (PRINZIDE,ZESTORETIC) 10-12.5 MG tablet Take 1 tablet by mouth daily.   Yes [provider]  rosuvastatin (CRESTOR) 20 MG tablet Take 20 mg by mouth every evening.    Yes [provider]  vitamin B-12 (CYANOCOBALAMIN) 1000 MCG tablet Take 1,000 mcg by mouth 2 (two) times a week. Wednesday and Saturday   Yes [provider]  warfarin (COUMADIN) 5 MG tablet Take as directed by [facility] Patient taking differently: Take 2.5-5 mg by mouth See admin instructions. Take 5 mg  tues, thurs, sat, and 2.5 mg all other days. 12/21/17  Yes Constance Haw, MD   Physical Exam: Vitals:   05/04/18 1230 05/04/18 1300 05/04/18 1330 05/04/18 1337  BP: (!) 101/55 (!) 118/52 (!) 123/59 (!) 123/59  Pulse: (!) 57 (!) 58 (!) 56 (!) 58  Resp: 15 15 13 16   Temp:      TempSrc:      SpO2: 93% 96% 95% 95%  Weight:      Height:       Constitutional: WN/WD obese Caucasian male in NAD and appears calm and comfortable Eyes: Lids and conjunctivae normal, sclerae anicteric  ENMT: External Ears, Nose appear normal. Grossly normal hearing. Mucous membranes are moist.  Neck: Appears normal, supple, no cervical masses, normal ROM, no appreciable thyromegaly; no JVD Respiratory: Slightly diminished to auscultation bilaterally, no wheezing, rales, rhonchi or crackles. Normal respiratory effort and patient is not tachypenic. No accessory muscle use.  Cardiovascular: RRR, no murmurs / rubs / gallops. S1 and S2 auscultated. Trace extremity edema. Abdomen: Soft, non-tender, non-distended. Has a Diastasis Recti Bowel sounds positive.  GU: Deferred. Musculoskeletal: No clubbing / cyanosis of digits/nails. No joint deformity upper and lower extremities. Skin: No rashes, lesions, ulcers on a limited skin evaluation. No induration;  Warm and dry.  Neurologic: CN 2-12 grossly intact with no focal deficits. Does have some aphasia. Sensation the same in Upper extremities. Bilateral Strength 5/5 in Upper Extremities. Right Leg weaker than left but could be chronic from prior CVA. Romberg sign and cerebellar reflexes not assessed.  Psychiatric: Normal judgment and insight. Alert and oriented x 3. Normal mood and appropriate affect.   Labs on Admission: I have personally reviewed following labs and imaging studies  CBC: Recent Labs  Lab 05/04/18 1007 05/04/18 1009  WBC 12.4*  --   NEUTROABS 9.0*  --   HGB 16.7 17.7*  HCT 53.9* 52.0  MCV 95.9  --   PLT 162  --    Basic Metabolic Panel: Recent Labs  Lab 05/04/18 1007 05/04/18 1009  NA 142 138  K 4.6 5.7*  CL 106 109  CO2 26  --  GLUCOSE 104* 99  BUN 28* 40*  CREATININE 1.71* 1.70*  CALCIUM 9.1  --    GFR: Estimated Creatinine Clearance: 43.1 mL/min (A) (by C-G formula based on SCr of 1.7 mg/dL (H)). Liver Function Tests: Recent Labs  Lab 05/04/18 1007  AST 23  ALT 28  ALKPHOS 80  BILITOT 1.6*  PROT 6.7  ALBUMIN 4.0   No results for input(s): LIPASE, AMYLASE in the last 168 hours. No results for input(s): AMMONIA in the last 168 hours. Coagulation Profile: Recent Labs  Lab 05/04/18 1007  INR 2.02   Cardiac Enzymes: No results for input(s): CKTOTAL, CKMB, CKMBINDEX, TROPONINI in the last 168 hours. BNP (last 3 results) No results for input(s): PROBNP in the last 8760 hours. HbA1C: No results for input(s): HGBA1C in the last 72 hours. CBG: Recent Labs  Lab 05/04/18 1019  GLUCAP 95   Lipid Profile: No results for input(s): CHOL, HDL, LDLCALC, TRIG, CHOLHDL, LDLDIRECT in the last 72 hours. Thyroid Function Tests: No results for input(s): TSH, T4TOTAL, FREET4, T3FREE, THYROIDAB in the last 72 hours. Anemia Panel: No results for input(s): VITAMINB12, FOLATE, FERRITIN, TIBC, IRON, RETICCTPCT in the last 72 hours. Urine analysis:      Component Value Date/Time   COLORURINE YELLOW 05/04/2018 1310   APPEARANCEUR CLEAR 05/04/2018 1310   LABSPEC 1.017 05/04/2018 1310   PHURINE 5.0 05/04/2018 1310   GLUCOSEU NEGATIVE 05/04/2018 1310   HGBUR NEGATIVE 05/04/2018 1310   BILIRUBINUR NEGATIVE 05/04/2018 1310   KETONESUR NEGATIVE 05/04/2018 1310   PROTEINUR NEGATIVE 05/04/2018 1310   NITRITE NEGATIVE 05/04/2018 1310   LEUKOCYTESUR NEGATIVE 05/04/2018 1310   Sepsis Labs: !!!!!!!!!!!!!!!!!!!!!!!!!!!!!!!!!!!!!!!!!!!! @LABRCNTIP (procalcitonin:4,lacticidven:4) )No results found for this or any previous visit (from the past 240 hour(s)).   Radiological Exams on Admission: Mr Jeri Cos And Wo Contrast  Result Date: 05/04/2018 CLINICAL DATA:  Right arm weakness.  History of meningiomas. EXAM: MRI HEAD WITHOUT AND WITH CONTRAST TECHNIQUE: Multiplanar, multiecho pulse sequences of the brain and surrounding structures were obtained without and with intravenous contrast. CONTRAST:  10 mL Gadavist COMPARISON:  Head CT 05/04/2018 and MRI 04/06/2018 FINDINGS: Brain: There are multiple small acute cortical and subcortical infarcts in the left parietal and posterior left frontal lobes. There is also a small acute infarct in the posterior right frontal lobe white matter at the level of the centrum semiovale. No acute intracranial hemorrhage, midline shift, or extra-axial fluid collection is identified. There is mild cerebral atrophy. Extensive encephalomalacia and gliosis involving the left frontal lobe, insula, and superior left temporal lobe are unchanged from the prior MRI. 2.6 x 2.3 x 4.5 cm right parafalcine extra-axial mass invading the superior sagittal sinus is unchanged from the prior MRI with the sinus appearing patent more proximally and distally. Associated mild edema in the posterior right frontal lobe is unchanged. 2.1 x 2.1 x 1.3 cm extra-axial mass along the left anterior clinoid process and 7 mm mass laterally in the left middle cranial  fossa are also unchanged. No new enhancing intracranial lesion is identified. Vascular: Chronic right ICA occlusion. Skull and upper cervical spine: Left-sided craniectomy. Sinuses/Orbits: Unremarkable orbits. Clear paranasal sinuses. Small right mastoid effusion. Other: None. IMPRESSION: 1. Small acute posterior left MCA/watershed infarcts. 2. Small acute infarct in the right centrum semiovale. 3. Stable postoperative changes and 3 meningiomas as detailed above. Electronically Signed   By: Logan Bores M.D.   On: 05/04/2018 12:28   Ct Head Code Stroke Wo Contrast  Result Date: 05/04/2018 CLINICAL DATA:  Code  stroke.  Right arm weakness EXAM: CT HEAD WITHOUT CONTRAST TECHNIQUE: Contiguous axial images were obtained from the base of the skull through the vertex without intravenous contrast. COMPARISON:  04/06/2018 brain MRI FINDINGS: Brain: Known meningiomas along the left anterior clinoid and parasagittal right frontal parietal convexity. These were recently characterized with brain MRI. Vasogenic edema in the right posterior frontal white matter is unchanged and mild. Extensive encephalomalacia in the left frontal lobe, insula, and superior temporal lobe deep to a craniotomy and cranioplasty that could be post-ischemic, compressive, or postoperative. There is no evidence of superimposed infarct or hemorrhage. No hydrocephalus. Vascular: No hyperdense vessel Skull: Cranioplasty on the left that is unremarkable Sinuses/Orbits: Negative Other: These results were called by telephone at the time of interpretation on 05/04/2018 at 10:44 am to Dr. Shirlyn Goltz , who verbally acknowledged these results. ASPECTS [facility] Stroke Program Early CT Score) Not scored given the extent of chronic change. IMPRESSION: 1. No acute finding. 2. Known intracranial meningiomas with stable mild vasogenic edema at the right frontal parietal convexity. 3. Extensive left cerebral encephalomalacia. Electronically Signed   By: Monte Fantasia M.D.   On: 05/04/2018 10:47   EKG: Independently reviewed. Showed a Sinus Rhythm at a rate of 63 and no evidence of ST Elevation or Depression on My interpretation.  Assessment/Plan Active Problems:   Carotid stenosis   History of CVA (cerebrovascular accident)   Atrial fibrillation (HCC)   Leg weakness   Chronic anticoagulation   Benign essential HTN   Stage 3 chronic kidney disease (HCC)   History of meningioma   History of Guillain-Barre syndrome   Leukocytosis   GBS (Guillain Barre syndrome) (HCC)   Acute CVA (cerebrovascular accident) ([place])  Acute CVA of posterior left MCA/watershed infarct and a small acute infarct in the right centrum semiovale -Place in Obs Telemetry -Presented with right arm weakness and face feeling funny which is now resolved -Stroke Head CT showed no acute finding and known intracranial meningiomas with stable mild vasogenic edema at the right frontoparietal convexity.  There is also extensive left cerebral encephalomalacia -MRI of the Brain w/wo Contrast showed small acute posterior left MCA/watershed infarcts.  There is also small acute infarct in the right centrum semiovale.  Grossly stable postoperative changes and 3 meningiomas described  -I discussed the case with neurology Dr. Lorraine Lax who recommends transfer to [facility] further work-up including a MRA of the head and neck, carotid Dopplers, as well as an echocardiogram, fasting lipid panel and hemoglobin A1c. -We will get PT/OT/SLP evaluate -Neurochecks per protocol -Continue Coumadin and will defer to neurology to add aspirin -Further care per neurology once patient gets to [facility] and evaluated by their service  Leukocytosis -Likely reactive in setting of acute CVA -Continue monitor for signs and symptoms of infection -Patient is afebrile -Repeat CBC in a.m.  CKD stage III -Creatinine was 1.7 on admission -Avoid nephrotoxic medications if possible -Continue monitor and trend renal  function  Hyperkalemia -Likely hemolysis; potassium on CMP was 4.6 -Continue monitor and repeat CMP in a.m.  Hyperglycemia -Likely reactive -Check hemoglobin A1c for stratification -Continue monitor and if necessary placed on sensitive NovoLog sliding scale insulin   History of paroxysmal atrial fibrillation -Continue with Coumadin per pharmacy to dose -Tinea with diltiazem XR 180 mg p.o. daily -Continue telemetry  Meningiomas -status post radiation of August this year -Follows up with Dr. [name] in the outpatient setting -MRI shows that meningiomas are stable and unchanged from previous studies\ -Need neurosurgical  follow-up in outpatient  History of CVA  -As above  History of Guillain-Barr syndrome -Continue to monitor and neurology will evaluate  Hyperlipidemia -Continue with rosuvastatin 20 mg p.o. nightly  Hypertension -Allow for permissive hypertension at this time given his acute CVA -Currently holding his lisinopril-hydrochlorothiazide 10-12.5 mg p.o. daily  Obesity -Estimated body mass index is 31.57 kg/m as calculated from the following:   Height as of this encounter: 5\' 10"  (1.778 m).   Weight as of this encounter: 99.8 kg. -Weight Loss Counseling Given   DVT prophylaxis: Anticoagulated with Coumadin Code Status: FULL CODE Family Communication: Discussed with Wife and Son at bedside Disposition Plan: Pending PT and OT evaluation and anticipate D/C back to [place] called: Neurology Dr. Lorraine Lax; TeleNeurology  Admission status: Obs Telemetry   Severity of Illness: The appropriate patient status for this patient is OBSERVATION. Observation status is judged to be reasonable and necessary in order to provide the required intensity of service to ensure the patient's safety. The patient's presenting symptoms, physical exam findings, and initial radiographic and laboratory data in the context of their medical condition is felt to place them at  decreased risk for further clinical deterioration. Furthermore, it is anticipated that the patient will be medically stable for discharge from the hospital within 2 midnights of admission. The following factors support the patient status of observation.   " The patient's presenting symptoms include Right Arm Weakness. " The physical exam findings include Diminished strength in the Right Leg " The initial radiographic and laboratory data are concerning for a CVA and Leukocytosis respectively  Kerney Elbe, D.O. [facility] PAGER is on [place]  If 7PM-7AM, please contact night-coverage [email or web address] Password [email or web address]  05/04/2018, 2:18 PM

## 2018-05-05 ENCOUNTER — Ambulatory Visit (HOSPITAL_BASED_OUTPATIENT_CLINIC_OR_DEPARTMENT_OTHER): Payer: PPO

## 2018-05-05 DIAGNOSIS — I10 Essential (primary) hypertension: Secondary | ICD-10-CM | POA: Diagnosis not present

## 2018-05-05 DIAGNOSIS — Z8673 Personal history of transient ischemic attack (TIA), and cerebral infarction without residual deficits: Secondary | ICD-10-CM

## 2018-05-05 DIAGNOSIS — Z7901 Long term (current) use of anticoagulants: Secondary | ICD-10-CM | POA: Diagnosis not present

## 2018-05-05 DIAGNOSIS — I63513 Cerebral infarction due to unspecified occlusion or stenosis of bilateral middle cerebral arteries: Secondary | ICD-10-CM | POA: Diagnosis present

## 2018-05-05 DIAGNOSIS — Z6831 Body mass index (BMI) 31.0-31.9, adult: Secondary | ICD-10-CM | POA: Diagnosis not present

## 2018-05-05 DIAGNOSIS — I63512 Cerebral infarction due to unspecified occlusion or stenosis of left middle cerebral artery: Secondary | ICD-10-CM | POA: Diagnosis present

## 2018-05-05 DIAGNOSIS — I482 Chronic atrial fibrillation, unspecified: Secondary | ICD-10-CM | POA: Diagnosis not present

## 2018-05-05 DIAGNOSIS — Z823 Family history of stroke: Secondary | ICD-10-CM | POA: Diagnosis not present

## 2018-05-05 DIAGNOSIS — Z887 Allergy status to serum and vaccine status: Secondary | ICD-10-CM | POA: Diagnosis not present

## 2018-05-05 DIAGNOSIS — I129 Hypertensive chronic kidney disease with stage 1 through stage 4 chronic kidney disease, or unspecified chronic kidney disease: Secondary | ICD-10-CM | POA: Diagnosis present

## 2018-05-05 DIAGNOSIS — Z79899 Other long term (current) drug therapy: Secondary | ICD-10-CM | POA: Diagnosis not present

## 2018-05-05 DIAGNOSIS — Z923 Personal history of irradiation: Secondary | ICD-10-CM | POA: Diagnosis not present

## 2018-05-05 DIAGNOSIS — I6389 Other cerebral infarction: Secondary | ICD-10-CM

## 2018-05-05 DIAGNOSIS — I6522 Occlusion and stenosis of left carotid artery: Secondary | ICD-10-CM

## 2018-05-05 DIAGNOSIS — R29702 NIHSS score 2: Secondary | ICD-10-CM | POA: Diagnosis present

## 2018-05-05 DIAGNOSIS — I639 Cerebral infarction, unspecified: Secondary | ICD-10-CM | POA: Diagnosis present

## 2018-05-05 DIAGNOSIS — G610 Guillain-Barre syndrome: Secondary | ICD-10-CM | POA: Diagnosis present

## 2018-05-05 DIAGNOSIS — N183 Chronic kidney disease, stage 3 (moderate): Secondary | ICD-10-CM | POA: Diagnosis present

## 2018-05-05 DIAGNOSIS — I6521 Occlusion and stenosis of right carotid artery: Secondary | ICD-10-CM

## 2018-05-05 DIAGNOSIS — E785 Hyperlipidemia, unspecified: Secondary | ICD-10-CM | POA: Diagnosis present

## 2018-05-05 DIAGNOSIS — I6340 Cerebral infarction due to embolism of unspecified cerebral artery: Secondary | ICD-10-CM

## 2018-05-05 DIAGNOSIS — I6529 Occlusion and stenosis of unspecified carotid artery: Secondary | ICD-10-CM | POA: Diagnosis not present

## 2018-05-05 DIAGNOSIS — I480 Paroxysmal atrial fibrillation: Secondary | ICD-10-CM | POA: Diagnosis present

## 2018-05-05 DIAGNOSIS — B359 Dermatophytosis, unspecified: Secondary | ICD-10-CM | POA: Diagnosis present

## 2018-05-05 DIAGNOSIS — I6523 Occlusion and stenosis of bilateral carotid arteries: Secondary | ICD-10-CM | POA: Diagnosis present

## 2018-05-05 DIAGNOSIS — I6350 Cerebral infarction due to unspecified occlusion or stenosis of unspecified cerebral artery: Secondary | ICD-10-CM | POA: Diagnosis present

## 2018-05-05 DIAGNOSIS — R739 Hyperglycemia, unspecified: Secondary | ICD-10-CM | POA: Diagnosis present

## 2018-05-05 DIAGNOSIS — Z87891 Personal history of nicotine dependence: Secondary | ICD-10-CM | POA: Diagnosis not present

## 2018-05-05 DIAGNOSIS — E875 Hyperkalemia: Secondary | ICD-10-CM | POA: Diagnosis present

## 2018-05-05 DIAGNOSIS — Z86011 Personal history of benign neoplasm of the brain: Secondary | ICD-10-CM | POA: Diagnosis not present

## 2018-05-05 DIAGNOSIS — E669 Obesity, unspecified: Secondary | ICD-10-CM | POA: Diagnosis present

## 2018-05-05 DIAGNOSIS — G8191 Hemiplegia, unspecified affecting right dominant side: Secondary | ICD-10-CM | POA: Diagnosis present

## 2018-05-05 DIAGNOSIS — Z886 Allergy status to analgesic agent status: Secondary | ICD-10-CM | POA: Diagnosis not present

## 2018-05-05 LAB — PROTIME-INR
INR: 1.91
Prothrombin Time: 21.6 seconds — ABNORMAL HIGH (ref 11.4–15.2)

## 2018-05-05 LAB — BASIC METABOLIC PANEL
Anion gap: 8 (ref 5–15)
BUN: 26 mg/dL — ABNORMAL HIGH (ref 8–23)
CO2: 28 mmol/L (ref 22–32)
CREATININE: 2.03 mg/dL — AB (ref 0.61–1.24)
Calcium: 9.1 mg/dL (ref 8.9–10.3)
Chloride: 106 mmol/L (ref 98–111)
GFR calc Af Amer: 36 mL/min — ABNORMAL LOW (ref >60)
GFR, EST NON AFRICAN AMERICAN: 31 mL/min — AB (ref >60)
Glucose, Bld: 137 mg/dL — ABNORMAL HIGH (ref 70–99)
Potassium: 4.1 mmol/L (ref 3.5–5.1)
Sodium: 142 mmol/L (ref 135–145)

## 2018-05-05 LAB — LIPID PANEL
Cholesterol: 125 mg/dL (ref 0–200)
HDL: 27 mg/dL — ABNORMAL LOW (ref >40)
LDL Cholesterol: 58 mg/dL (ref 0–99)
Total CHOL/HDL Ratio: 4.6 RATIO
Triglycerides: 200 mg/dL — ABNORMAL HIGH (ref <150)
VLDL: 40 mg/dL (ref 0–40)

## 2018-05-05 LAB — ECHOCARDIOGRAM COMPLETE
Height: 70 in
Weight: 3520 oz

## 2018-05-05 LAB — HEMOGLOBIN A1C
Hgb A1c MFr Bld: 6.1 % — ABNORMAL HIGH (ref 4.8–5.6)
Mean Plasma Glucose: 128.37 mg/dL

## 2018-05-05 MED ORDER — WARFARIN SODIUM 2.5 MG PO TABS
2.5000 mg | ORAL_TABLET | Freq: Once | ORAL | Status: DC
  Filled 2018-05-05: qty 1

## 2018-05-05 MED ORDER — SODIUM CHLORIDE 0.9 % IV SOLN
INTRAVENOUS | Status: DC
  Administered 2018-05-05: 18:00:00 via INTRAVENOUS

## 2018-05-05 NOTE — Evaluation (Signed)
Occupational Therapy Evaluation Patient Details Name: [name] MRN: [id number] DOB: [birth year] Today's Date: 05/05/2018    History of Present Illness Pt is a 77 y.o. M with significant PMH of atrial fibrillation, Guillain Barre syndrome, history of meningiomas, brain surgery, CVA, CKD stage III, hypertension, who presents with right arm weakness and facial tingling. MRI shows posterior L MCA, R centrum semiovale stroke. NIHSS score: 2.   Clinical Impression   Pt admitted with above.  He demonstrates deficits with memory, attention, problem solving, awareness, as well as deficits with visual pursuits and saccades.   When reading, he makes multiple omissions and misreads words (no pattern noted).   Wife reports he has long standing cognitive deficits, but is able to compensate and has remained independent including medication, financial management, and driving.   Wife does not overtly notice any changes with his cognitive or visual function (he wrote a note to his grand daughter recently which contained multiple omissions and insertions).  Discussed my concerns about the possibility that pt may have new mild deficits that may impact him functionally, but that I am not going to be able to accurately assess this in this environment.  She agrees to provide 24 hour supervision at discharge, and will note anything that is out of the normal for patient.  Recommend OPOT evaluation.  Recommend pt does not drive until cleared by MD.  Acute OT will sign off.     Follow Up Recommendations  Outpatient OT;Supervision/Assistance - 24 hour    Equipment Recommendations  None recommended by OT    Recommendations for Other Services       Precautions / Restrictions Precautions Precautions: Fall Restrictions Weight Bearing Restrictions: No      Mobility Bed Mobility Overal bed mobility: Independent                Transfers Overall transfer level: Independent Equipment used: None                  Balance                                           ADL either performed or assessed with clinical judgement   ADL Overall ADL's : Modified independent                                             Vision Baseline Vision/History: Wears glasses Wears Glasses: At all times Patient Visual Report: No change from baseline Vision Assessment?: Yes Eye Alignment: Within Functional Limits Ocular Range of Motion: Within Functional Limits Tracking/Visual Pursuits: Other (comment)(Pt frequently looses fixation and has to initiate a saccade ) Saccades: Undershoots;Decreased speed of saccadic movement Convergence: Within functional limits Visual Fields: No apparent deficits Additional Comments: Pt self distracts during testing.   When reading, it was noted that pt frequently omitted words and frequently mis reads words.  He reports he does not read much, but will occasionally read a newspaper article.  He denies difficulties with reading articles PTA, but then will indicate he really doesn't fully read the info.  Wife reports he wrote a note to his grand daughter a couple of days ago, and there were multiple word ommissions and substitutions.      Perception Perception Perception Tested?: Yes  Praxis Praxis Praxis tested?: Within functional limits    Pertinent Vitals/Pain Pain Assessment: No/denies pain     Hand Dominance Right   Extremity/Trunk Assessment Upper Extremity Assessment Upper Extremity Assessment: Overall WFL for tasks assessed RUE Deficits / Details: Strength 5/5 LUE Deficits / Details: Strength 5/5   Lower Extremity Assessment Lower Extremity Assessment: Defer to PT evaluation   Cervical / Trunk Assessment Cervical / Trunk Assessment: Kyphotic   Communication Communication Communication: No difficulties;Other (comment)(Noted slow speech, but may be baseline)   Cognition Arousal/Alertness: Awake/alert Behavior  During Therapy: Flat affect Overall Cognitive Status: Difficult to assess                                 General Comments: Per wife, pt has a h/o cognitive deficits and is able to compensate well and maintains his independence including financial and medication management.   Pt is noted to have deficits with speed of processing, problem solving, memory, attention, and awareness, but unsure if this is baselin for him or not.  Wife does not notice overt changes/deficits, but until he is tested in his own environment utilizing his current compensations, it will be extremely difficult to determine if he may have new mild deficits.  Instructed pt and wife that I am going to recommend OPOT, and that pt should not drive, and that I recommend wife provide direct supervision with medications, finances, as well as other activities to determine if he struggles. Both are in agreement with this plan    General Comments       Exercises     Shoulder Instructions      Home Living Family/patient expects to be discharged to:: Private residence Living Arrangements: Spouse/significant other Available Help at Discharge: Family Type of Home: House Home Access: Stairs to enter Technical brewer of Steps: 2 Entrance Stairs-Rails: Right Home Layout: Two level Alternate Level Stairs-Number of Steps: (flight)   Bathroom Shower/Tub: Occupational psychologist: [place]: Clinical cytogeneticist - 2 wheels;Cane - single point          Prior Functioning/Environment Level of Independence: Independent        Comments[place] and [place] yard work. Rides his tractor, drives.  Pt reports last Guillain Barre episodes was 2 years ago but he does not have residual deficits        OT Problem List: Decreased cognition;Impaired vision/perception      OT Treatment/Interventions:      OT Goals(Current goals can be found in the care plan section) Acute Rehab OT  Goals Patient Stated Goal: To go back home  OT Goal Formulation: All assessment and education complete, DC therapy  OT Frequency:     Barriers to D/C:            Co-evaluation              AM-PAC OT "6 Clicks" Daily Activity     Outcome Measure Help from another person eating meals?: None Help from another person taking care of personal grooming?: None Help from another person toileting, which includes using toliet, bedpan, or urinal?: None Help from another person bathing (including washing, rinsing, drying)?: None Help from another person to put on and taking off regular upper body clothing?: None Help from another person to put on and taking off regular lower body clothing?: None 6 Click Score: 24   End of  Session    Activity Tolerance: Patient tolerated treatment well Patient left: Other (comment)(transport to ECHO )  OT Visit Diagnosis: Cognitive communication deficit (R41.841) Symptoms and signs involving cognitive functions: Cerebral infarction                Time: 1117-1203 OT Time Calculation (min): 46 min Charges:  OT General Charges $OT Visit: 1 Visit OT Evaluation $OT Eval Moderate Complexity: 1 Mod OT Treatments $Therapeutic Activity: 23-37 mins  Lucille Passy, OTR/L [place] Pager [phone] Office [phone]   Lucille Passy M 05/05/2018, 12:18 PM

## 2018-05-05 NOTE — Progress Notes (Signed)
Progress Note    [name]  GNO:[id number] DOB: Apr 21, 1941  DOA: 05/04/2018 PCP: Lavone Orn, MD    Brief Narrative:     Medical records reviewed and are as summarized below:  [name] is an 77 y.o. male with medical history significant of accessible atrial fibrillation secondary to his Guyon Barr syndrome hospitalization, history of meningiomas, history of carotid artery occlusion, history of CVA, CKD stage III, hypertension, hyperlipidemia, history of memory loss, diverticulosis and other comorbidities presented with right arm weakness and facial tingling.  Found to have a CVA and work up in progress.  Assessment/Plan:   Active Problems:   Carotid stenosis   History of CVA (cerebrovascular accident)   Atrial fibrillation (HCC)   Leg weakness   Chronic anticoagulation   Benign essential HTN   Stage 3 chronic kidney disease (HCC)   History of meningioma   History of Guillain-Barre syndrome   Leukocytosis   GBS (Guillain Barre syndrome) (HCC)   Acute CVA (cerebrovascular accident) ([place])   CVA (cerebral vascular accident) ([place])  Acute CVA of posterior left MCA/watershed infarct and a small acute infarct in the right centrum semiovale -MRI of the Brain showed small acute posterior left MCA/watershed infarcts.  There is also small acute infarct in the right centrum semiovale.  Grossly stable postoperative changes and 3 meningiomas described  -echo -carotid: preliminary read: Right ICA occluded & Left ICA 40-59% stenosis as known from previous study.  ---  Incidental finding: A hyperechoic round lesion seen at the left mid ICA with measurement of 0.61x0.46cm. Etiology unknown and not documented on last study on 08/16/2017.  -await official read -gently hydrate overnight and then CTA in AM if able to better assess lesion -plan to stop coumadin and start eliquis tomorrow after CTA  Leukocytosis -Likely reactive in setting of acute CVA  CKD stage  III -monitor CR -baseline around 1.6  Hyperkalemia -like hemolysis  Hyperglycemia -HgbA1c: 6.1  History of paroxysmal atrial fibrillation -Continue with Coumadin per pharmacy to dose - diltiazem XR 180 mg p.o. daily  Meningiomas -status post radiation of August this year -Follows up with Dr. [name] in the outpatient setting -MRI shows that meningiomas are stable and unchanged from previous studies -Need neurosurgical follow-up in outpatient  History of CVA  -As above  History of Guillain-Barr syndrome -Continue to monitor and neurology will evaluate  Hyperlipidemia -Continue with rosuvastatin 20 mg p.o. nightly  Hypertension -Allow for permissive hypertension at this time given his acute CVA  Obesity Estimated body mass index is 31.57 kg/m as calculated from the following:   Height as of this encounter: 5\' 10"  (1.778 m).   Weight as of this encounter: 99.8 kg.  Family Communication/Anticipated D/C date and plan/Code Status   DVT prophylaxis: coumadin to eliquis Code Status: Full Code.  Family Communication:  Disposition Plan:    Medical Consultants:    Neurology     Subjective:   Anxious to go home  Objective:    Vitals:   05/05/18 0221 05/05/18 0802 05/05/18 0922 05/05/18 1118  BP: 108/62 115/69 (!) 106/57 111/67  Pulse: 80 69 69 (!) 59  Resp: 16 17 18 18   Temp: 98 F (36.7 C) 98.3 F (36.8 C)  98.2 F (36.8 C)  TempSrc: Oral Oral  Oral  SpO2: 93% 94% 95% 97%  Weight:      Height:       No intake or output data in the 24 hours ending 05/05/18 [place]  05/04/18 0947 05/04/18 1032  Weight: 99.8 kg 99.8 kg    Exam: In bed, pleasant and cooperative rrr No increased work of breathing No LE edema   Data Reviewed:   I have personally reviewed following labs and imaging studies:  Labs: Labs show the following:   Basic Metabolic Panel: Recent Labs  Lab 05/04/18 1007 05/04/18 1009 05/05/18 0921  NA 142 138  142  K 4.6 5.7* 4.1  CL 106 109 106  CO2 26  --  28  GLUCOSE 104* 99 137*  BUN 28* 40* 26*  CREATININE 1.71* 1.70* 2.03*  CALCIUM 9.1  --  9.1   GFR Estimated Creatinine Clearance: 36.1 mL/min (A) (by C-G formula based on SCr of 2.03 mg/dL (H)). Liver Function Tests: Recent Labs  Lab 05/04/18 1007  AST 23  ALT 28  ALKPHOS 80  BILITOT 1.6*  PROT 6.7  ALBUMIN 4.0   No results for input(s): LIPASE, AMYLASE in the last 168 hours. No results for input(s): AMMONIA in the last 168 hours. Coagulation profile Recent Labs  Lab 05/04/18 1007 05/05/18 0921  INR 2.02 1.91    CBC: Recent Labs  Lab 05/04/18 1007 05/04/18 1009  WBC 12.4*  --   NEUTROABS 9.0*  --   HGB 16.7 17.7*  HCT 53.9* 52.0  MCV 95.9  --   PLT 162  --    Cardiac Enzymes: Recent Labs  Lab 05/04/18 1825  TROPONINI <0.03   BNP (last 3 results) No results for input(s): PROBNP in the last 8760 hours. CBG: Recent Labs  Lab 05/04/18 1019  GLUCAP 95   D-Dimer: No results for input(s): DDIMER in the last 72 hours. Hgb A1c: Recent Labs    05/05/18 0552  HGBA1C 6.1*   Lipid Profile: Recent Labs    05/05/18 0552  CHOL 125  HDL 27*  LDLCALC 58  TRIG 200*  CHOLHDL 4.6   Thyroid function studies: No results for input(s): TSH, T4TOTAL, T3FREE, THYROIDAB in the last 72 hours.  Invalid input(s): FREET3 Anemia work up: No results for input(s): VITAMINB12, FOLATE, FERRITIN, TIBC, IRON, RETICCTPCT in the last 72 hours. Sepsis Labs: Recent Labs  Lab 05/04/18 1007  WBC 12.4*    Microbiology No results found for this or any previous visit (from the past 240 hour(s)).  Procedures and diagnostic studies:  Mr Jeri Cos And Wo Contrast  Result Date: 05/04/2018 CLINICAL DATA:  Right arm weakness.  History of meningiomas. EXAM: MRI HEAD WITHOUT AND WITH CONTRAST TECHNIQUE: Multiplanar, multiecho pulse sequences of the brain and surrounding structures were obtained without and with intravenous  contrast. CONTRAST:  10 mL Gadavist COMPARISON:  Head CT 05/04/2018 and MRI 04/06/2018 FINDINGS: Brain: There are multiple small acute cortical and subcortical infarcts in the left parietal and posterior left frontal lobes. There is also a small acute infarct in the posterior right frontal lobe white matter at the level of the centrum semiovale. No acute intracranial hemorrhage, midline shift, or extra-axial fluid collection is identified. There is mild cerebral atrophy. Extensive encephalomalacia and gliosis involving the left frontal lobe, insula, and superior left temporal lobe are unchanged from the prior MRI. 2.6 x 2.3 x 4.5 cm right parafalcine extra-axial mass invading the superior sagittal sinus is unchanged from the prior MRI with the sinus appearing patent more proximally and distally. Associated mild edema in the posterior right frontal lobe is unchanged. 2.1 x 2.1 x 1.3 cm extra-axial mass along the left anterior clinoid process and 7 mm mass laterally  in the left middle cranial fossa are also unchanged. No new enhancing intracranial lesion is identified. Vascular: Chronic right ICA occlusion. Skull and upper cervical spine: Left-sided craniectomy. Sinuses/Orbits: Unremarkable orbits. Clear paranasal sinuses. Small right mastoid effusion. Other: None. IMPRESSION: 1. Small acute posterior left MCA/watershed infarcts. 2. Small acute infarct in the right centrum semiovale. 3. Stable postoperative changes and 3 meningiomas as detailed above. Electronically Signed   By: Logan Bores M.D.   On: 05/04/2018 12:28   Mr Jodene Nam Head Wo Contrast  Result Date: 05/04/2018 CLINICAL DATA:  77 y/o  M; follow-up of stroke. EXAM: MRA HEAD WITHOUT CONTRAST TECHNIQUE: Angiographic images of the Circle of Willis were obtained using MRA technique without intravenous contrast. COMPARISON:  05/04/2018 MRI head. 07/04/2009 CT angiogram of the neck. FINDINGS: Internal carotid arteries: Patent left. No flow related signal in the  right internal carotid artery compatible with occlusion excepting the patent terminus. Anterior cerebral arteries: Patent. Large left A1, small right A1, large anterior communicating artery, normal variant. Middle cerebral arteries: Patent. Anterior communicating artery: Patent. Posterior communicating arteries: Patent. Large right and diminutive left. Posterior cerebral arteries:  Patent. Basilar artery:  Patent. Vertebral arteries:  Patent.  Left dominant. No additional evidence of high-grade stenosis, large vessel occlusion, or aneurysm. IMPRESSION: 1. Chronic right ICA occlusion with patent terminus. Right MCA distribution collateralization from anterior and right posterior communicating arteries. 2. No additional evidence for stenosis, large vessel occlusion, or aneurysm. Electronically Signed   By: Kristine Garbe M.D.   On: 05/04/2018 21:45   Ct Head Code Stroke Wo Contrast  Result Date: 05/04/2018 CLINICAL DATA:  Code stroke.  Right arm weakness EXAM: CT HEAD WITHOUT CONTRAST TECHNIQUE: Contiguous axial images were obtained from the base of the skull through the vertex without intravenous contrast. COMPARISON:  04/06/2018 brain MRI FINDINGS: Brain: Known meningiomas along the left anterior clinoid and parasagittal right frontal parietal convexity. These were recently characterized with brain MRI. Vasogenic edema in the right posterior frontal white matter is unchanged and mild. Extensive encephalomalacia in the left frontal lobe, insula, and superior temporal lobe deep to a craniotomy and cranioplasty that could be post-ischemic, compressive, or postoperative. There is no evidence of superimposed infarct or hemorrhage. No hydrocephalus. Vascular: No hyperdense vessel Skull: Cranioplasty on the left that is unremarkable Sinuses/Orbits: Negative Other: These results were called by telephone at the time of interpretation on 05/04/2018 at 10:44 am to Dr. Shirlyn Goltz , who verbally acknowledged these  results. ASPECTS [facility] Stroke Program Early CT Score) Not scored given the extent of chronic change. IMPRESSION: 1. No acute finding. 2. Known intracranial meningiomas with stable mild vasogenic edema at the right frontal parietal convexity. 3. Extensive left cerebral encephalomalacia. Electronically Signed   By: Monte Fantasia M.D.   On: 05/04/2018 10:47   Vas US Carotid (at [place] Only)  Result Date: 05/05/2018 Carotid Arterial Duplex Study Indications:       CVA, Weakness and Right arm weakness. Risk Factors:      Hypertension, prior CVA, PAD. Other Factors:     History of Left Carotid endarterectomy on 09/13/2001. Limitations:       shadowing from vessel wall calcifications Comparison Study:  Carotid on 08/16/2017 with occluded right ICA. Performing Technologist: Rudell Cobb  Examination Guidelines: A complete evaluation includes B-mode imaging, spectral Doppler, color Doppler, and power Doppler as needed of all accessible portions of each vessel. Bilateral testing is considered an integral part of a complete examination. Limited examinations for reoccurring  indications may be performed as noted.  Right Carotid Findings: +----------+--------+-------+--------+--------------------------------+--------+           PSV cm/sEDV    StenosisDescribe                        Comments                   cm/s                                                    +----------+--------+-------+--------+--------------------------------+--------+ CCA Prox  227                    diffuse, hyperechoic and                                                  calcific                                 +----------+--------+-------+--------+--------------------------------+--------+ CCA Mid   106                    diffuse, hypoechoic and                                                   irregular                                 +----------+--------+-------+--------+--------------------------------+--------+ CCA Distal95                                                              +----------+--------+-------+--------+--------------------------------+--------+ ICA Prox                 Occluded                                         +----------+--------+-------+--------+--------------------------------+--------+ ICA Mid                  Occluded                                         +----------+--------+-------+--------+--------------------------------+--------+ ICA Distal               Occluded                                         +----------+--------+-------+--------+--------------------------------+--------+ ECA       180     20                                                      +----------+--------+-------+--------+--------------------------------+--------+ +----------+--------+-------+--------+-------------------+  PSV cm/sEDV cmsDescribeArm Pressure (mmHG) +----------+--------+-------+--------+-------------------+ [id number]                                        +----------+--------+-------+--------+-------------------+ +---------+--------+--+--------+--+---------+ VertebralPSV cm/s61EDV cm/s14Antegrade +---------+--------+--+--------+--+---------+  Left Carotid Findings: +----------+--------+-------+--------+--------------------------------+--------+           PSV cm/sEDV    StenosisDescribe                        Comments                   cm/s                                                    +----------+--------+-------+--------+--------------------------------+--------+ CCA Prox  109     19             hypoechoic and diffuse                   +----------+--------+-------+--------+--------------------------------+--------+ CCA Mid   117     24                                                       +----------+--------+-------+--------+--------------------------------+--------+ CCA Distal117     23             diffuse, hyperechoic and                                                  calcific                                 +----------+--------+-------+--------+--------------------------------+--------+ ICA Prox  228     35     40-59%                                           +----------+--------+-------+--------+--------------------------------+--------+ ICA Mid   118     26                                                      +----------+--------+-------+--------+--------------------------------+--------+ ICA Distal199     36                                                      +----------+--------+-------+--------+--------------------------------+--------+ ECA       65      14                                                      +----------+--------+-------+--------+--------------------------------+--------+ +----------+--------+--------+------------------+-------------------+  SubclavianPSV cm/sEDV cm/sDescribe          Arm Pressure (mmHG) +----------+--------+--------+------------------+-------------------+           360             hyperechoic plaque                    +----------+--------+--------+------------------+-------------------+ +---------+--------+---+--------+--+---------+ VertebralPSV cm/s140EDV cm/s30Antegrade +---------+--------+---+--------+--+---------+ A hyperechoic round lesion seen at the left mid ICA with measurement of 0.61x0.46cm. Etiology unknown.  Summary: Right Carotid: Evidence consistent with a total occlusion of the right ICA. Left Carotid: Velocities in the left ICA are consistent with a 40-59% stenosis.               Incidental finding: A hyperechoic round lesion seen at the left               mid ICA with measurement of 0.61x0.46cm. Etiology unknown and not               documented on last study on 08/16/2017.  Vertebrals: Bilateral vertebral arteries demonstrate antegrade flow. *See table(s) above for measurements and observations.     Preliminary     Medications:   . cholecalciferol  1,000 Units Oral Daily  . diltiazem  180 mg Oral QPC breakfast  . rosuvastatin  20 mg Oral QPM  . [START ON 05/06/2018] vitamin B-12  1,000 mcg Oral Q Wed,Sat  . warfarin  2.5 mg Oral ONCE-1800  . Warfarin - Pharmacist Dosing Inpatient   Does not apply q1800   Continuous Infusions: . sodium chloride       LOS: 0 days   Geradine Girt  [facility]   *Please refer to [email or web address], password [email or web address] to get updated schedule on who will round on this patient, as hospitalists switch teams weekly. If 7PM-7AM, please contact night-coverage at [email or web address], password [email or web address] for any overnight needs.  05/05/2018, 3:46 PM

## 2018-05-05 NOTE — Progress Notes (Signed)
Bilateral carotid duplex exam completed. Right ICA occluded & Left ICA 40-59% stenosis as known from previous study.  ---  Incidental finding: A hyperechoic round lesion seen at the left mid ICA with measurement of 0.61x0.46cm. Etiology unknown and not documented on last study on 08/16/2017.  Please see preliminary notes on CV PROC under chart review. [name](RDMS RVT) 05/05/18 1:13 PM

## 2018-05-05 NOTE — Progress Notes (Signed)
ANTICOAGULATION CONSULT NOTE - Initial Consult  Pharmacy Consult for warfarin Indication: atrial fibrillation and stroke  Allergies  Allergen Reactions  . Immune Globulins Other (See Comments)    Tetanus Immunes Globulins    (  Pt. Cannot take the Flu shot ) Guillian Barre   . Influenza Vaccines Other (See Comments)    Guillian Barre [name]  . Rho (D) Immune Globulin Other (See Comments)    Tetanus Immunes Globulins(Pt. Cannot take the Flu shot ) [name]   . Aspirin Other (See Comments)    Severe skin bruising  Severe skin bruising     Patient Measurements: Height: 5\' 10"  (177.8 cm) Weight: 220 lb (99.8 kg) IBW/kg (Calculated) : 73  Vital Signs: Temp: 98.3 F (36.8 C) (12/20 0802) Temp Source: Oral (12/20 0802) BP: 106/57 (12/20 0922) Pulse Rate: 69 (12/20 0922)  Labs: Recent Labs    05/04/18 1007 05/04/18 1009 05/04/18 1825 05/05/18 0921  HGB 16.7 17.7*  --   --   HCT 53.9* 52.0  --   --   PLT 162  --   --   --   APTT 33  --   --   --   LABPROT 22.6*  --   --  21.6*  INR 2.02  --   --  1.91  CREATININE 1.71* 1.70*  --  2.03*  TROPONINI  --   --  <0.03  --     Estimated Creatinine Clearance: 36.1 mL/min (A) (by C-G formula based on SCr of 2.03 mg/dL (H)).   Medical History: Past Medical History:  Diagnosis Date  . Atrial fibrillation ([place]) 02/2016   NEW ONSET  . Brain tumor (benign) ([place]) 1998   2 small tumors- since 2015 (Dr. [name] following)  . Carotid artery occlusion   . Cerebrovascular disease September 09, 2001   TIA, Left brain  . CKD (chronic kidney disease), stage III ([place])   . Diverticulosis   . ED (erectile dysfunction)   . Guillain-Barre syndrome ([place]) 1999  . Hx of elevated lipids   . Hyperlipidemia   . Hypertension   . Memory loss   . Stroke [facility]) 2003    Medications:  Scheduled:  Infusions:   Assessment: 77 yo male presented to ER with right arm weakness and facial tingling this AM that is now resolved, CVA  confirmed per head CT. Plan to transfer to [facility], neuro recommended to continue warfarin per Rx dosing. Patient reportedly takes warfarin PTA at dosing of 2.5mg  daily except 5mg  TuThSat for hx Afib, hx CVA. INR therapeutic on presentation to ER. CBC OK. No reported bleeding per head CT. Last reported dose of warfarin per med history was 12/18 at 1800.   Given 5mg  dose last PM with INR 1.91 this AM. Will continue current daily dose for now. May adjust if INR <2 in AM 12/21.    Goal of Therapy:  INR 2-3   Plan:  1) Will get 2.5mg  this PM at 1800 2) Daily INR 3) Monitor for signs/symptoms of bleeding   Aerica Rincon A. Levada Dy, PharmD, [place] Pager: [phone] Please utilize Amion for appropriate phone number to reach the unit pharmacist ([place])   05/05/2018 10:41 AM

## 2018-05-05 NOTE — Care Management (Signed)
Eliquis benefits check sent and pending. CM team will follow to discuss est monthly copay amount and provide an Eliquis card once cost has been determined.   Maud Deed. Mikel Hardgrove BSN, NCM-BC, ACM-RN

## 2018-05-05 NOTE — Evaluation (Signed)
Physical Therapy Evaluation Patient Details Name: [name] MRN: [id number] DOB: [birth year] Today's Date: 05/05/2018   History of Present Illness  Pt is a 77 y.o. M with significant PMH of atrial fibrillation, Guillain Barre syndrome, history of meningiomas, brain surgery, CVA, CKD stage III, hypertension, who presents with right arm weakness and facial tingling. MRI shows posterior L MCA, R centrum semiovale stroke. NIHSS score: 2.  Clinical Impression  Patient evaluated by Physical Therapy with no further acute PT needs identified. Pt denies residual deficits and states he is at his functional baseline. On PT evaluation, pt does present with static and dynamic balance deficits in addition to decreased gait speed for age. Ambulating 380 feet with no assistive device without difficulty. Educated patient on stroke symptoms; he verbalized understanding. All education has been completed and the patient has no further questions. See below for any follow-up Physical Therapy or equipment needs. PT is signing off. Thank you for this referral.     Follow Up Recommendations No PT follow up (would benefit from OPPT for balance training, but pt currently declining)    Equipment Recommendations  None recommended by PT    Recommendations for Other Services       Precautions / Restrictions Precautions Precautions: Fall Restrictions Weight Bearing Restrictions: No      Mobility  Bed Mobility Overal bed mobility: Independent                Transfers Overall transfer level: Independent Equipment used: None                Ambulation/Gait Ambulation/Gait assistance: Modified independent (Device/Increase time)(increased time) Gait Distance (Feet): 380 Feet Assistive device: None Gait Pattern/deviations: Step-through pattern;Decreased stride length Gait velocity: 1.64 ft/s Gait velocity interpretation: <1.8 ft/sec, indicate of risk for recurrent falls General Gait  Details: Pt with slow speed, kyphotic posture, and decreased reciprocal arm swing. No gross unsteadiness or overt LOB  Stairs            Wheelchair Mobility    Modified Rankin (Stroke Patients Only) Modified Rankin (Stroke Patients Only) Pre-Morbid Rankin Score: No significant disability Modified Rankin: No significant disability     Balance Overall balance assessment: Needs assistance               Single Leg Stance - Right Leg: 3 Single Leg Stance - Left Leg: 10 Tandem Stance - Right Leg: 10   Rhomberg - Eyes Opened: 10                   Pertinent Vitals/Pain Pain Assessment: No/denies pain    Home Living Family/patient expects to be discharged to:: Private residence Living Arrangements: Spouse/significant other Available Help at Discharge: Family Type of Home: House Home Access: Stairs to enter Entrance Stairs-Rails: Right Entrance Stairs-Number of Steps: 2 Home Layout: Two level Home Equipment: Clinical cytogeneticist - 2 wheels;Cane - single point      Prior Function Level of Independence: Independent         [place]. Pt reports last Guillain Barre episodes was 2 years ago but he does not have residual deficits     Hand Dominance   Dominant Hand: Right    Extremity/Trunk Assessment   Upper Extremity Assessment Upper Extremity Assessment: RUE deficits/detail;LUE deficits/detail RUE Deficits / Details: Strength 5/5 LUE Deficits / Details: Strength 5/5    Lower Extremity Assessment Lower Extremity Assessment: RLE deficits/detail;LLE deficits/detail RLE Deficits / Details: Strength 5/5 LLE  Deficits / Details: Strength 5/5    Cervical / Trunk Assessment Cervical / Trunk Assessment: Kyphotic  Communication   Communication: No difficulties;Other (comment)(Noted slow speech, but may be baseline)  Cognition Arousal/Alertness: Awake/alert Behavior During Therapy: Flat affect Overall Cognitive Status:  Within Functional Limits for tasks assessed                                        General Comments      Exercises     Assessment/Plan    PT Assessment Patent does not need any further PT services  PT Problem List         PT Treatment Interventions      PT Goals (Current goals can be found in the Care Plan section)  Acute Rehab PT Goals Patient Stated Goal: "get back home." PT Goal Formulation: All assessment and education complete, DC therapy    Frequency     Barriers to discharge        Co-evaluation               AM-PAC PT "6 Clicks" Mobility  Outcome Measure Help needed turning from your back to your side while in a flat bed without using bedrails?: None Help needed moving from lying on your back to sitting on the side of a flat bed without using bedrails?: None Help needed moving to and from a bed to a chair (including a wheelchair)?: None Help needed standing up from a chair using your arms (e.g., wheelchair or bedside chair)?: None Help needed to walk in hospital room?: None Help needed climbing 3-5 steps with a railing? : None 6 Click Score: 24    End of Session Equipment Utilized During Treatment: Gait belt Activity Tolerance: Patient tolerated treatment well Patient left: in bed;with call bell/phone within reach Nurse Communication: Other (comment)(vitals) PT Visit Diagnosis: Unsteadiness on feet (R26.81);Other symptoms and signs involving the nervous system (R29.898)    Time: 5573-2202 PT Time Calculation (min) (ACUTE ONLY): 28 min   Charges:   PT Evaluation $PT Eval Moderate Complexity: 1 Mod PT Treatments $Therapeutic Activity: 8-22 mins       Ellamae Sia, PT, DPT Acute Rehabilitation Services Pager [phone] Office [phone]  Willy Eddy 05/05/2018, 10:31 AM

## 2018-05-05 NOTE — Progress Notes (Signed)
  Echocardiogram 2D Echocardiogram has been performed.  [name] 05/05/2018, 11:09 AM

## 2018-05-05 NOTE — Care Management (Addendum)
#    6.  [name]  SE;EYA  @  [employer] # [phone] OPT- 2   APIXABAN :  NONE FORMULARY  1. ELIQUIS  5 MG BID COVER- YES CO-PAY- $ 45.00 TIER- 3 DRUG PRIOR APPROVAL- NO  2. ELIQUIS  2.50 MG BID COVER- YES  CO-PAY- $ 45.00 TIER- 3 DRUG  PRIOR APPROVAL- NO  PREFERRED PHARMACY : YES [facility] AND [facility] M/O 90 DAY SUPPLY FOR   M/O OR RETAIL $ 90.00

## 2018-05-05 NOTE — Consult Note (Signed)
Referring Physician: Dr Eliseo Squires    Reason for Consult: stroke  HPI: [name] is an 77 y.o. male with history of a previous stroke in 2013 , atrial fibrillation (coumadin PTA - INR 2.02 day of admission), hypertension, hyperlipidemia, multiple meningiomas s/p radiation in August this year, Ethelene Hal syndrome in 1999, hyperlipidemia, chronic kidney disease, known right carotid artery occlusion, and history of benign brain tumor resection in 2015 seen at [facility] 05/04/2018 with transient numbness and weakness right arm and right side of mouth. Tele neurology consult at [facility] -> NIHSS 2 -> TPA not recommended as deficits were improving.  Date last known well: 05/04/2018 Time last known well: 7:30 AM  tPA Given: no - improving deficits  Past Medical History Past Medical History:  Diagnosis Date  . Atrial fibrillation ([place]) 02/2016   NEW ONSET  . Brain tumor (benign) ([place]) 1998   2 small tumors- since 2015 (Dr. [name] following)  . Carotid artery occlusion   . Cerebrovascular disease September 09, 2001   TIA, Left brain  . CKD (chronic kidney disease), stage III ([place])   . Diverticulosis   . ED (erectile dysfunction)   . Guillain-Barre syndrome ([place]) 1999  . Hx of elevated lipids   . Hyperlipidemia   . Hypertension   . Memory loss   . Stroke [facility]) 2003    Surgical History Past Surgical History:  Procedure Laterality Date  . BRAIN SURGERY  1998   Benign brain tumor removed, Left hemisphere- ? Meningioma  . CAROTID ENDARTERECTOMY Left September 13, 2001   LEFT cea  . CHOLECYSTECTOMY  Nov. 2001  . COLON SURGERY  Feb. 2003   Colonic polyps removed endoscopically  . COLONOSCOPY WITH PROPOFOL N/A 11/11/2015   Procedure: COLONOSCOPY WITH PROPOFOL;  Surgeon: Garlan Fair, MD;  Location: [facility] ENDOSCOPY;  Service: Endoscopy;  Laterality: N/A;  . INGUINAL HERNIA REPAIR  1970's    Family History  Family History  Problem Relation Age of Onset  . Colon cancer Mother        Metastatic   .  Stroke Father   . Diabetes Father     Social History:   reports that he quit smoking about 21 years ago. His smoking use included cigarettes. He has never used smokeless tobacco. He reports current alcohol use of about 1.0 standard drinks of alcohol per week. He reports that he does not use drugs.  Allergies:  Allergies  Allergen Reactions  . Immune Globulins Other (See Comments)    Tetanus Immunes Globulins    (  Pt. Cannot take the Flu shot ) Guillian Barre   . Influenza Vaccines Other (See Comments)    Guillian Barre [name]  . Rho (D) Immune Globulin Other (See Comments)    Tetanus Immunes Globulins(Pt. Cannot take the Flu shot ) [name]   . Aspirin Other (See Comments)    Severe skin bruising  Severe skin bruising     Home Medications:  Medications Prior to Admission  Medication Sig Dispense Refill  . acetaminophen (TYLENOL) 500 MG tablet Take 1,000 mg by mouth every 8 (eight) hours as needed for moderate pain.    . cholecalciferol (VITAMIN D) 1000 units tablet Take 1,000 Units by mouth daily.    Marland Kitchen DILT-XR 180 MG 24 hr capsule Take 180 mg by mouth daily after breakfast.   5  . lisinopril-hydrochlorothiazide (PRINZIDE,ZESTORETIC) 10-12.5 MG tablet Take 1 tablet by mouth daily.    . rosuvastatin (CRESTOR) 20 MG tablet Take 20  mg by mouth every evening.     . vitamin B-12 (CYANOCOBALAMIN) 1000 MCG tablet Take 1,000 mcg by mouth 2 (two) times a week. Wednesday and Saturday    . warfarin (COUMADIN) 5 MG tablet Take as directed by [facility] (Patient taking differently: Take 2.5-5 mg by mouth See admin instructions. Take 5 mg  tues, thurs, sat, and 2.5 mg all other days.) 30 tablet 3    Hospital Medications . cholecalciferol  1,000 Units Oral Daily  . diltiazem  180 mg Oral QPC breakfast  . rosuvastatin  20 mg Oral QPM  . [START ON 05/06/2018] vitamin B-12  1,000 mcg Oral Q Wed,Sat    ROS:  History obtained from the patient   General ROS: negative  for - chills, fatigue, fever, night sweats, weight gain or weight loss Psychological ROS: negative for - behavioral disorder, hallucinations, memory difficulties, mood swings or suicidal ideation Ophthalmic ROS: negative for - blurry vision, double vision, eye pain or loss of vision ENT ROS: negative for - epistaxis, nasal discharge, oral lesions, sore throat, tinnitus or vertigo Allergy and Immunology ROS: negative for - hives or itchy/watery eyes Hematological and Lymphatic ROS: negative for - bleeding problems, bruising or swollen lymph nodes Endocrine ROS: negative for - galactorrhea, hair pattern changes, polydipsia/polyuria or temperature intolerance Respiratory ROS: negative for - cough, hemoptysis, shortness of breath or wheezing Cardiovascular ROS: negative for - chest pain, dyspnea on exertion, edema or irregular heartbeat Gastrointestinal ROS: negative for - abdominal pain, diarrhea, hematemesis, nausea/vomiting or stool incontinence Genito-Urinary ROS: negative for - dysuria, hematuria, incontinence or urinary frequency/urgency Musculoskeletal ROS: negative for - joint swelling or muscular weakness Neurological ROS: as noted in HPI Dermatological ROS: negative for rash and skin lesion changes   Physical Examination: PENDING Vitals:   05/05/18 0221 05/05/18 0802 05/05/18 0922 05/05/18 1118  BP: 108/62 115/69 (!) 106/57 111/67  Pulse: 80 69 69 (!) 59  Resp: 16 17 18 18   Temp: 98 F (36.7 C) 98.3 F (36.8 C)  98.2 F (36.8 C)  TempSrc: Oral Oral  Oral  SpO2: 93% 94% 95% 97%  Weight:      Height:         Neurologic Examination:  Mental Status: Alert, oriented, thought content appropriate.  Speech fluent without evidence of aphasia. Able to follow 3 step commands without difficulty. Cranial Nerves: II: Discs not visualized; Visual fields grossly normal, pupils equal, round, reactive to light III,IV, VI: ptosis not present, extra-ocular motions intact bilaterally V,VII:  smile symmetric, facial light touch sensation normal bilaterally VIII: hearing normal bilaterally IX,X: gag reflex present XI: bilateral shoulder shrug XII: midline tongue extension Motor: RUE - 5/5    LUE - 5/5   RLE - 5/5    LLE - 5/5 Tone and bulk:normal tone throughout; no atrophy noted Sensory: Light touch intact throughout, bilaterally Deep Tendon Reflexes: 2+ and symmetric throughout Plantars: Right: downgoing   Left: downgoing Cerebellar: normal finger-to-nose and normal heel-to-shin test Gait: deferred at this time.    LABORATORY STUDIES:  Basic Metabolic Panel: Recent Labs  Lab 05/04/18 1007 05/04/18 1009 05/05/18 0921  NA 142 138 142  K 4.6 5.7* 4.1  CL 106 109 106  CO2 26  --  28  GLUCOSE 104* 99 137*  BUN 28* 40* 26*  CREATININE 1.71* 1.70* 2.03*  CALCIUM 9.1  --  9.1    Liver Function Tests: Recent Labs  Lab 05/04/18 1007  AST 23  ALT 28  ALKPHOS 80  BILITOT 1.6*  PROT 6.7  ALBUMIN 4.0   No results for input(s): LIPASE, AMYLASE in the last 168 hours. No results for input(s): AMMONIA in the last 168 hours.  CBC: Recent Labs  Lab 05/04/18 1007 05/04/18 1009  WBC 12.4*  --   NEUTROABS 9.0*  --   HGB 16.7 17.7*  HCT 53.9* 52.0  MCV 95.9  --   PLT 162  --     Cardiac Enzymes: Recent Labs  Lab 05/04/18 1825  TROPONINI <0.03    BNP: Invalid input(s): POCBNP  CBG: Recent Labs  Lab 05/04/18 [place]    Microbiology:   Coagulation Studies: Recent Labs    05/04/18 1007 05/05/18 0921  LABPROT 22.6* 21.6*  INR 2.02 1.91    Urinalysis:  Recent Labs  Lab 05/04/18 1310  COLORURINE YELLOW  LABSPEC 1.017  PHURINE 5.0  GLUCOSEU NEGATIVE  HGBUR NEGATIVE  BILIRUBINUR NEGATIVE  KETONESUR NEGATIVE  PROTEINUR NEGATIVE  NITRITE NEGATIVE  LEUKOCYTESUR NEGATIVE    Lipid Panel:     Component Value Date/Time   CHOL 125 05/05/2018 0552   TRIG 200 (H) 05/05/2018 0552   HDL 27 (L) 05/05/2018 0552   CHOLHDL 4.6  05/05/2018 0552   VLDL 40 05/05/2018 0552   LDLCALC 58 05/05/2018 0552    HgbA1C:  Lab Results  Component Value Date   HGBA1C 6.1 (H) 05/05/2018    Urine Drug Screen:      Component Value Date/Time   LABOPIA NONE DETECTED 05/04/2018 1310   COCAINSCRNUR NONE DETECTED 05/04/2018 1310   LABBENZ NONE DETECTED 05/04/2018 1310   AMPHETMU NONE DETECTED 05/04/2018 1310   THCU NONE DETECTED 05/04/2018 1310   LABBARB NONE DETECTED 05/04/2018 1310     Alcohol Level:  Recent Labs  Lab 05/04/18 [place] <10    Miscellaneous labs:  EKG  EKG - SR rate 63 BPM   IMAGING:  Mr Jeri Cos And Wo Contrast 05/04/2018 IMPRESSION:  1. Small acute posterior left MCA/watershed infarcts.  2. Small acute infarct in the right centrum semiovale.  3. Stable postoperative changes and 3 meningiomas as detailed above.    Mr Jodene Nam Head Wo Contrast 05/04/2018 IMPRESSION:  1. Chronic right ICA occlusion with patent terminus. Right MCA distribution collateralization from anterior and right posterior communicating arteries.  2. No additional evidence for stenosis, large vessel occlusion, or aneurysm.     Ct Head Code Stroke Wo Contrast 05/04/2018 IMPRESSION:  1. No acute finding.  2. Known intracranial meningiomas with stable mild vasogenic edema at the right frontal parietal convexity. 3. Extensive left cerebral encephalomalacia.        Assessment: 77 y.o. male with history of a previous stroke in 2013 , paroxysmal atrial fibrillation (coumadin PTA - INR 2.02 day of admission), hypertension, hyperlipidemia, multiple meningiomas s/p radiation in August this year, Ethelene Hal syndrome in 1999, chronic kidney disease, known right carotid artery occlusion, and history of benign brain tumor resection in 2015 seen at [facility] 05/04/2018 with transient numbness and weakness right arm and right side of mouth. Pt did not receive TPA due to minimal improving deficits.   Stroke Risk Factors - PAF,  advanced age, htn, hld, family history and obesity.  Plan:  PT consult, OT consult, Speech consult  Consider changing to newer anticoagulant -> done  Allow for permissive hypertension for the first 24-48h - only treat PRN if SBP >220 mmHg. Blood pressures can be gradually normalized to SBP<140 upon discharge  Continue Statin Therapy -  LDL goal < 70  Risk factor modification  Telemetry monitoring  Frequent neuro checks  Fall Precautions  DVT prophelaxis  NPO until swallowing evaluation has been passed (aspirin suppository if needed)   Mikey Bussing PA-C [facility] Pager [phone] 05/05/2018, 4:16 PM  ATTENDING NOTE: I reviewed above note and agree with the assessment and plan. Pt was seen and examined.   77 yo M with hx of HTN, HLD, CKD, right ICA occlusion, left ICA stenosis s/p left CEA in 2003, stroke in 2003 with no residue, afib on coumadin, multiple recurrent meningioma following with Dr. Mickeal Skinner, GBS in 03/2016 admitted for right arm weakness, right facial droop. Symptoms gradually resolved and currently pt felt that he is back to baseline.   CT head showed left brain encephalomalacia, right frontoparietal meningioma. MRI showed left MCA punctate and watershed infarct, right MCA/ACA punctate infarcts. MRA showed right ICA choronic occlusion. EF 50-55%. INR 2.02->1.91. LDL 58 and A1C 6.1. pt stroke likely due to afib on coumadin with low or subtherapeutic INR, vs. Carotid stenosis/occlusion in the setting of low BP.   CUS showed right ICA occlusion with left ICA 40-59% stable stenosis. However, it also showed a hyperechoic round lesion seen at the left mid ICA with  measurement of 0.61x0.46cm. Etiology unknown and not documented on last study on 08/16/2017. For this reason, will need CTA head and neck. Pt has CKD with Cre 2.03. discussed with Dr. Eliseo Squires, will give hydration tonight and will do CTA head and neck in am.   For stroke prevention, will recommend  to discontinue coumadin and switch to eliquis 5mg  bid once CTA head and neck done and cleared from vascular standpoint. Continue crestor for stroke prevention. Will follow.   Rosalin Hawking, MD PhD Stroke Neurology 05/06/2018 12:20 AM

## 2018-05-06 ENCOUNTER — Inpatient Hospital Stay (HOSPITAL_COMMUNITY): Payer: PPO

## 2018-05-06 ENCOUNTER — Encounter (HOSPITAL_COMMUNITY): Payer: Self-pay | Admitting: Radiology

## 2018-05-06 DIAGNOSIS — I6523 Occlusion and stenosis of bilateral carotid arteries: Secondary | ICD-10-CM

## 2018-05-06 DIAGNOSIS — I482 Chronic atrial fibrillation, unspecified: Secondary | ICD-10-CM

## 2018-05-06 LAB — BASIC METABOLIC PANEL
ANION GAP: 9 (ref 5–15)
BUN: 29 mg/dL — ABNORMAL HIGH (ref 8–23)
CO2: 26 mmol/L (ref 22–32)
Calcium: 8.6 mg/dL — ABNORMAL LOW (ref 8.9–10.3)
Chloride: 105 mmol/L (ref 98–111)
Creatinine, Ser: 1.86 mg/dL — ABNORMAL HIGH (ref 0.61–1.24)
GFR calc Af Amer: 40 mL/min — ABNORMAL LOW (ref >60)
GFR calc non Af Amer: 34 mL/min — ABNORMAL LOW (ref >60)
GLUCOSE: 113 mg/dL — AB (ref 70–99)
Potassium: 4.6 mmol/L (ref 3.5–5.1)
Sodium: 140 mmol/L (ref 135–145)

## 2018-05-06 LAB — PROTIME-INR
INR: 2.07
Prothrombin Time: 23 seconds — ABNORMAL HIGH (ref 11.4–15.2)

## 2018-05-06 MED ORDER — ASPIRIN 81 MG PO CHEW
81.0000 mg | CHEWABLE_TABLET | Freq: Every day | ORAL | Status: DC
  Administered 2018-05-06: 81 mg via ORAL
  Filled 2018-05-06: qty 1

## 2018-05-06 MED ORDER — PANTOPRAZOLE SODIUM 40 MG PO TBEC: 40.0000 mg | DELAYED_RELEASE_TABLET | Freq: Every day | ORAL | 0 refills | Status: DC

## 2018-05-06 MED ORDER — APIXABAN 5 MG PO TABS
5.0000 mg | ORAL_TABLET | Freq: Two times a day (BID) | ORAL | Status: DC
  Administered 2018-05-06: 5 mg via ORAL
  Filled 2018-05-06: qty 1

## 2018-05-06 MED ORDER — APIXABAN 5 MG PO TABS: 5.0000 mg | ORAL_TABLET | Freq: Two times a day (BID) | ORAL | 0 refills | Status: DC

## 2018-05-06 MED ORDER — IOPAMIDOL (ISOVUE-370) INJECTION 76%
INTRAVENOUS | Status: AC
  Administered 2018-05-06: 50 mL
  Filled 2018-05-06: qty 50

## 2018-05-06 MED ORDER — ASPIRIN 81 MG PO CHEW: 81.0000 mg | CHEWABLE_TABLET | Freq: Every day | ORAL | 0 refills | Status: AC

## 2018-05-06 MED ORDER — LACTATED RINGERS IV SOLN
INTRAVENOUS | Status: AC
  Administered 2018-05-06: 11:00:00 via INTRAVENOUS

## 2018-05-06 MED ORDER — PANTOPRAZOLE SODIUM 40 MG PO TBEC
40.0000 mg | DELAYED_RELEASE_TABLET | Freq: Every day | ORAL | Status: DC
  Administered 2018-05-06: 40 mg via ORAL
  Filled 2018-05-06: qty 1

## 2018-05-06 NOTE — Discharge Summary (Signed)
[name] HDQ:[id number] DOB: 1940-06-24 DOA: 05/04/2018  PCP: Lavone Orn, MD  Admit date: 05/04/2018  Discharge date: 05/06/2018  Admitted From: Home  Disposition:  Home   Recommendations for Outpatient Follow-up:   Follow up with PCP in 1-2 weeks  PCP Please obtain BMP/CBC, 2 view CXR in 1week,  (see Discharge instructions)   PCP Please follow up on the following pending results:    Home Health: PT, RN   Equipment/Devices:   Consultations: Neuro, VVS Discharge Condition: Stable   CODE STATUS: Full   Diet Recommendation: Heart Healthy     Chief Complaint  Patient presents with  . Extremity Weakness     Brief history of present illness from the day of admission and additional interim summary    [name] is an 77 y.o. male with medical history significant ofaccessible atrial fibrillation secondary to his Guyon Barr syndrome hospitalization,history of meningiomas, history of carotid artery occlusion, history of CVA, CKD stage III, hypertension, hyperlipidemia, history of memory loss, diverticulosis and other comorbidities presented with right arm weakness and facial tingling.  Found to have a CVA and work up in progress.                                                                  Hospital Course    Acute CVA ofposterior left MCA/watershed infarct and a small acute infarct in the right centrum semiovale -Mild right-sided weakness and right-sided facial droop, seen by neurology, A1c and LDL acceptable, was on Coumadin before for paroxysmal atrial fibrillation.  Per neurology recommendations switch to Eliquis, continue home dose statin, discussed with vascular surgery due to nonspecific left-sided carotid artery findings.  Vascular surgery recommended to add aspirin additionally  as they thought he had some platelet debris in his left carotid artery.  Currently he has been placed on Eliquis-aspirin along with PPI and statin.  Symptoms much improved.  Will get home PT and RN as well.  No dysphagia or speech issues.  Will be discharged with PCP and neurology follow-up along with follow-up with vascular surgery.  Leukocytosis -Reactionary.  No signs of infection.  CKD stage III -monitor CR -baseline around 1.6, was hydrated before and after CT angiogram.  Repeat BMP in a week by PCP.  Hyperkalemia -like hemolysis  Hyperglycemia -HgbA1c: 6.1  History of paroxysmal atrial fibrillation -Continue with Coumadin per pharmacy to dose - diltiazem XR 180 mg p.o. daily  Meningiomas -status post radiation of August this year -Follows up with Dr. [name] in the outpatient setting -MRI shows that meningiomas are stable and unchanged from previous studies -Need neurosurgical follow-up in outpatient  History of CVA  -As above  History of Guillain-Barr syndrome -Continue to monitor and neurology will evaluate  Hyperlipidemia -Continue with rosuvastatin 20 mg p.o. nightly  Hypertension -Allow for permissive hypertension at this time given his acute CVA  Obesity Estimated body mass index is 31.57 kg/m as calculated from the following:   Height as of this encounter: 5\' 10"  (1.778 m).   Weight as of this encounter: 99.8 kg.   Carotid artery stenosis on the left with some nonspecific postop changes.  Discussed with Dr. Carlis Abbott vascular surgeon, add aspirin to Eliquis, continue statin.  Follow with Dr. early in a week after discharge.   Discharge diagnosis     Active Problems:   Carotid occlusion, right   Carotid stenosis   History of CVA (cerebrovascular accident)   Atrial fibrillation (HCC)   Leg weakness   Chronic anticoagulation   Benign essential HTN   Stage 3 chronic kidney disease (HCC)   History of meningioma   History of Guillain-Barre  syndrome   Leukocytosis   GBS (Guillain Barre syndrome) (HCC)   Acute CVA (cerebrovascular accident) ([place])   CVA (cerebral vascular accident) [facility])    Discharge instructions    Discharge Instructions    Diet - low sodium heart healthy   Complete by:  As directed    Discharge instructions   Complete by:  As directed    Follow with Primary MD Lavone Orn, MD in 7 days   Get CBC, CMP checked  by Primary MD or SNF MD in 5-7 days   Activity: As tolerated with Full fall precautions use walker/cane & assistance as needed  Disposition Home   Diet: Heart Healthy   Special Instructions: If you have smoked or chewed Tobacco  in the last 2 yrs please stop smoking, stop any regular Alcohol  and or any Recreational drug use.  On your next visit with your primary care physician please Get Medicines reviewed and adjusted.  Please request your Prim.MD to go over all Hospital Tests and Procedure/Radiological results at the follow up, please get all Hospital records sent to your Prim MD by signing hospital release before you go home.  If you experience worsening of your admission symptoms, develop shortness of breath, life threatening emergency, suicidal or homicidal thoughts you must seek medical attention immediately by calling 911 or calling your MD immediately  if symptoms less severe.  You Must read complete instructions/literature along with all the possible adverse reactions/side effects for all the Medicines you take and that have been prescribed to you. Take any new Medicines after you have completely understood and accpet all the possible adverse reactions/side effects.   Increase activity slowly   Complete by:  As directed       Discharge Medications   Allergies as of 05/06/2018      Reactions   Immune Globulins Other (See Comments)   Tetanus Immunes Globulins    (  Pt. Cannot take the Flu shot ) Guillian Barre    Influenza Vaccines Other (See Comments)   Guillian  Barre Guillian Barre   Rho (d) Immune Globulin Other (See Comments)   Tetanus Immunes Globulins(Pt. Cannot take the Flu shot ) Guillian Barre    Aspirin Other (See Comments)   Severe skin bruising  Severe skin bruising       Medication List    STOP taking these medications   warfarin 5 MG tablet Commonly known as:  COUMADIN     TAKE these medications   acetaminophen 500 MG tablet Commonly known as:  TYLENOL Take 1,000 mg by mouth every 8 (eight) hours as needed for moderate pain.   apixaban 5 MG  Tabs tablet Commonly known as:  ELIQUIS Take 1 tablet (5 mg total) by mouth 2 (two) times daily.   aspirin 81 MG chewable tablet Chew 1 tablet (81 mg total) by mouth daily.   cholecalciferol 1000 units tablet Commonly known as:  VITAMIN D Take 1,000 Units by mouth daily.   DILT-XR 180 MG 24 hr capsule Generic drug:  diltiazem Take 180 mg by mouth daily after breakfast.   lisinopril-hydrochlorothiazide 10-12.5 MG tablet Commonly known as:  PRINZIDE,ZESTORETIC Take 1 tablet by mouth daily.   pantoprazole 40 MG tablet Commonly known as:  PROTONIX Take 1 tablet (40 mg total) by mouth daily.   rosuvastatin 20 MG tablet Commonly known as:  CRESTOR Take 20 mg by mouth every evening.   vitamin B-12 1000 MCG tablet Commonly known as:  CYANOCOBALAMIN Take 1,000 mcg by mouth 2 (two) times a week. Wednesday and Saturday       Follow-up Information    Lavone Orn, MD. Schedule an appointment as soon as possible for a visit in 1 week(s).   Specialty:  Internal Medicine Contact information: [address], [address] [place] [address] [phone]        Constance Haw, MD. Schedule an appointment as soon as possible for a visit in 1 week(s).   Specialty:  Cardiology Contact information: [address] [place] [address] [phone]        Rosetta Posner, MD. Schedule an appointment as soon as possible for a visit in 1 week(s).    Specialties:  Vascular Surgery, Cardiology Contact information: [place] [address] [phone]           Major procedures and Radiology Reports - PLEASE review detailed and final reports thoroughly  -     TTE  - Left ventricle: The cavity size was normal. Wall thickness was   increased in a pattern of mild LVH. Systolic function was normal.   The estimated ejection fraction was in the range of 50% to 55%.   Wall motion was normal; there were no regional wall motion   abnormalities. Doppler parameters are consistent with abnormal   left ventricular relaxation (grade 1 diastolic dysfunction). - Mitral valve: Calcified annulus.  Impressions:  - Normal LV systolic function; mild LVH; mild diastolic   dysfunction.   Ct Angio Head W Or Wo Contrast  Result Date: 05/06/2018 CLINICAL DATA:  Stroke with right-sided weakness. Cerebrovascular malformation high-flow. History of craniotomy for meningioma. EXAM: CT ANGIOGRAPHY HEAD AND NECK TECHNIQUE: Multidetector CT imaging of the head and neck was performed using the standard protocol during bolus administration of intravenous contrast. Multiplanar CT image reconstructions and MIPs were obtained to evaluate the vascular anatomy. Carotid stenosis measurements (when applicable) are obtained utilizing NASCET criteria, using the distal internal carotid diameter as the denominator. CONTRAST:  95mL ISOVUE-370 IOPAMIDOL (ISOVUE-370) INJECTION 76% COMPARISON:  MRI and MRA head 05/04/2018.  CT head 05/04/2018 FINDINGS: CT HEAD FINDINGS Brain: Left pterional craniotomy for resection of meningioma. Chronic encephalomalacia in the left frontal lobe is unchanged. Small bilateral infarcts best seen on prior MRI and not seen on CT. No acute hemorrhage Right posterior parasagittal meningioma with edema unchanged. Left clinoid meningioma unchanged. Ventricle size normal without midline shift. Vascular: Negative for hyperdense vessel Skull:  Left-sided craniotomy. No acute skeletal abnormality. Sinuses: Negative Orbits: Negative Review of the MIP images confirms the above findings CTA NECK FINDINGS Aortic arch: Atherosclerotic aortic arch with diffuse calcification. Proximal great vessels demonstrate  atherosclerotic calcification without significant stenosis. Right carotid system: Right internal carotid artery occluded at the origin. Atherosclerotic calcification at the right carotid bifurcation. External carotid artery mildly stenotic. Left carotid system: Mild atherosclerotic disease left common carotid artery. Prior carotid endarterectomy with extensive calcification in the wall of the endarterectomy site. Focal web-like stenosis distal left common carotid artery, estimated at 50% diameter stenosis. Left internal carotid artery patent without significant stenosis. Vertebral arteries: Left vertebral artery dominant. Mild atherosclerotic calcification proximal left vertebral artery. Left vertebral artery patent to the basilar. Small right vertebral artery patent to the basilar without significant stenosis. Skeleton: No acute abnormality. Prominent anterior osteophytes C4-5 and C5-6. Other neck: Negative Upper chest: Lung apices clear bilaterally. Review of the MIP images confirms the above findings CTA HEAD FINDINGS Anterior circulation: Right internal carotid artery is occluded. Right anterior and middle cerebral arteries are patent and supplied through the anterior communicating artery and right posterior communicating artery. Atherosclerotic calcification and mild stenosis left cavernous carotid. Left anterior cerebral artery patent. Left M1 segment mildly narrowed. Occlusion of the superior division of the left middle cerebral artery ( large area of encephalomalacia in this territory) . Inferior division supplying the left parietal lobe is widely patent. Posterior circulation: Basilar widely patent. Superior cerebellar and posterior cerebral  arteries patent bilaterally. Prominent right posterior communicating artery supplies anterior circulation on the right. Venous sinuses: Right posterior parasagittal meningioma appears to fill the sagittal sinus as noted on prior MRI. Anatomic variants: None Delayed phase: Left anterior clinoid enhancing mass compatible with meningioma measuring 23 mm. Small meningioma left anterior middle cranial fossa. Right parasagittal meningioma approximately 2.2 x 3 cm with surrounding white matter edema, unchanged from prior MRI. Review of the MIP images confirms the above findings IMPRESSION: 1. Chronic occlusion right internal carotid artery 2. Left carotid endarterectomy. Focal web-like stenosis distal left common carotid artery measuring approximately 50% diameter stenosis. Atherosclerotic calcification left cavernous sinus with mild stenosis. 3. Both vertebral arteries are patent with mild atherosclerotic disease proximal left vertebral artery. 4. Occlusion of the superior division of the left middle cerebral artery likely related to prior tumor resection and large area of encephalomalacia. 5. Prior left craniotomy for resection of meningioma with large area of encephalomalacia in the left frontal lobe. Meningioma in the left anterior clinoid, left middle cranial fossa, and right parasagittal regions are stable from recent MRI. 6. Small acute infarcts bilaterally best seen on MRI. 7. Negative for cerebrovascular malformation. Electronically Signed   By: Franchot Gallo M.D.   On: 05/06/2018 10:33   Ct Angio Neck W Or Wo Contrast  Result Date: 05/06/2018 CLINICAL DATA:  Stroke with right-sided weakness. Cerebrovascular malformation high-flow. History of craniotomy for meningioma. EXAM: CT ANGIOGRAPHY HEAD AND NECK TECHNIQUE: Multidetector CT imaging of the head and neck was performed using the standard protocol during bolus administration of intravenous contrast. Multiplanar CT image reconstructions and MIPs were  obtained to evaluate the vascular anatomy. Carotid stenosis measurements (when applicable) are obtained utilizing NASCET criteria, using the distal internal carotid diameter as the denominator. CONTRAST:  56mL ISOVUE-370 IOPAMIDOL (ISOVUE-370) INJECTION 76% COMPARISON:  MRI and MRA head 05/04/2018.  CT head 05/04/2018 FINDINGS: CT HEAD FINDINGS Brain: Left pterional craniotomy for resection of meningioma. Chronic encephalomalacia in the left frontal lobe is unchanged. Small bilateral infarcts best seen on prior MRI and not seen on CT. No acute hemorrhage Right posterior parasagittal meningioma with edema unchanged. Left clinoid meningioma unchanged. Ventricle size normal without midline shift. Vascular: Negative for  hyperdense vessel Skull: Left-sided craniotomy. No acute skeletal abnormality. Sinuses: Negative Orbits: Negative Review of the MIP images confirms the above findings CTA NECK FINDINGS Aortic arch: Atherosclerotic aortic arch with diffuse calcification. Proximal great vessels demonstrate atherosclerotic calcification without significant stenosis. Right carotid system: Right internal carotid artery occluded at the origin. Atherosclerotic calcification at the right carotid bifurcation. External carotid artery mildly stenotic. Left carotid system: Mild atherosclerotic disease left common carotid artery. Prior carotid endarterectomy with extensive calcification in the wall of the endarterectomy site. Focal web-like stenosis distal left common carotid artery, estimated at 50% diameter stenosis. Left internal carotid artery patent without significant stenosis. Vertebral arteries: Left vertebral artery dominant. Mild atherosclerotic calcification proximal left vertebral artery. Left vertebral artery patent to the basilar. Small right vertebral artery patent to the basilar without significant stenosis. Skeleton: No acute abnormality. Prominent anterior osteophytes C4-5 and C5-6. Other neck: Negative Upper chest:  Lung apices clear bilaterally. Review of the MIP images confirms the above findings CTA HEAD FINDINGS Anterior circulation: Right internal carotid artery is occluded. Right anterior and middle cerebral arteries are patent and supplied through the anterior communicating artery and right posterior communicating artery. Atherosclerotic calcification and mild stenosis left cavernous carotid. Left anterior cerebral artery patent. Left M1 segment mildly narrowed. Occlusion of the superior division of the left middle cerebral artery ( large area of encephalomalacia in this territory) . Inferior division supplying the left parietal lobe is widely patent. Posterior circulation: Basilar widely patent. Superior cerebellar and posterior cerebral arteries patent bilaterally. Prominent right posterior communicating artery supplies anterior circulation on the right. Venous sinuses: Right posterior parasagittal meningioma appears to fill the sagittal sinus as noted on prior MRI. Anatomic variants: None Delayed phase: Left anterior clinoid enhancing mass compatible with meningioma measuring 23 mm. Small meningioma left anterior middle cranial fossa. Right parasagittal meningioma approximately 2.2 x 3 cm with surrounding white matter edema, unchanged from prior MRI. Review of the MIP images confirms the above findings IMPRESSION: 1. Chronic occlusion right internal carotid artery 2. Left carotid endarterectomy. Focal web-like stenosis distal left common carotid artery measuring approximately 50% diameter stenosis. Atherosclerotic calcification left cavernous sinus with mild stenosis. 3. Both vertebral arteries are patent with mild atherosclerotic disease proximal left vertebral artery. 4. Occlusion of the superior division of the left middle cerebral artery likely related to prior tumor resection and large area of encephalomalacia. 5. Prior left craniotomy for resection of meningioma with large area of encephalomalacia in the left  frontal lobe. Meningioma in the left anterior clinoid, left middle cranial fossa, and right parasagittal regions are stable from recent MRI. 6. Small acute infarcts bilaterally best seen on MRI. 7. Negative for cerebrovascular malformation. Electronically Signed   By: Franchot Gallo M.D.   On: 05/06/2018 10:33   Mr Jeri Cos And Wo Contrast  Result Date: 05/04/2018 CLINICAL DATA:  Right arm weakness.  History of meningiomas. EXAM: MRI HEAD WITHOUT AND WITH CONTRAST TECHNIQUE: Multiplanar, multiecho pulse sequences of the brain and surrounding structures were obtained without and with intravenous contrast. CONTRAST:  10 mL Gadavist COMPARISON:  Head CT 05/04/2018 and MRI 04/06/2018 FINDINGS: Brain: There are multiple small acute cortical and subcortical infarcts in the left parietal and posterior left frontal lobes. There is also a small acute infarct in the posterior right frontal lobe white matter at the level of the centrum semiovale. No acute intracranial hemorrhage, midline shift, or extra-axial fluid collection is identified. There is mild cerebral atrophy. Extensive encephalomalacia and gliosis involving the left  frontal lobe, insula, and superior left temporal lobe are unchanged from the prior MRI. 2.6 x 2.3 x 4.5 cm right parafalcine extra-axial mass invading the superior sagittal sinus is unchanged from the prior MRI with the sinus appearing patent more proximally and distally. Associated mild edema in the posterior right frontal lobe is unchanged. 2.1 x 2.1 x 1.3 cm extra-axial mass along the left anterior clinoid process and 7 mm mass laterally in the left middle cranial fossa are also unchanged. No new enhancing intracranial lesion is identified. Vascular: Chronic right ICA occlusion. Skull and upper cervical spine: Left-sided craniectomy. Sinuses/Orbits: Unremarkable orbits. Clear paranasal sinuses. Small right mastoid effusion. Other: None. IMPRESSION: 1. Small acute posterior left MCA/watershed  infarcts. 2. Small acute infarct in the right centrum semiovale. 3. Stable postoperative changes and 3 meningiomas as detailed above. Electronically Signed   By: Logan Bores M.D.   On: 05/04/2018 12:28   Mr Jeri Cos FH Contrast  Result Date: 04/06/2018 CLINICAL DATA:  Followup meningioma.  S RS protocol. Creatinine was obtained on site at [place] at [address] Results: Creatinine 2.3 mg/dL. EXAM: MRI HEAD WITHOUT AND WITH CONTRAST TECHNIQUE: Multiplanar, multiecho pulse sequences of the brain and surrounding structures were obtained without and with intravenous contrast. CONTRAST:  39mL MULTIHANCE GADOBENATE DIMEGLUMINE 529 MG/ML IV SOLN COMPARISON:  10/07/2017 and multiple previous FINDINGS: Brain: Using the same measuring locations, there is no change since the previous study. Previous left frontal/parietal craniotomy as seen previously. 1) right parafalcine meningioma is stable measuring 2.6 x 2.3 x 4.5 cm. The superior sagittal sinus is filled with tumor at the parietal vertex level, but does show flow proximal and distal to that. 2) left sphenoid wing meningioma is unchanged at 2.2 x 2.2 x 1.3 cm. 3) small meningioma lateral to that in the left middle cranial fossa is unchanged at 7 mm. No new lesions. Right parietal vasogenic edema secondary to the meningioma appears similar. Degree of mass-effect upon the brain is similar. Atrophy, gliosis and encephalomalacia of the left insula and frontal operculum region is the same. Ventricular size is stable.  No evidence of subdural collection. Vascular: Chronic right ICA occlusion. Other vessels at the base of the brain show flow. Skull and upper cervical spine: Otherwise negative Sinuses/Orbits: Clear/normal Other: None IMPRESSION: Three meningiomas are stable since the study of 10/07/2017. Right parietal vertex lesion associated with some vasogenic edema, not increased. Filling of the superior sagittal sinus at this location, but with flow  demonstrated proximal and distal to that segment. Electronically Signed   By: Nelson Chimes M.D.   On: 04/06/2018 14:07   Mr Jodene Nam Head Wo Contrast  Result Date: 05/04/2018 CLINICAL DATA:  77 y/o  M; follow-up of stroke. EXAM: MRA HEAD WITHOUT CONTRAST TECHNIQUE: Angiographic images of the Circle of Willis were obtained using MRA technique without intravenous contrast. COMPARISON:  05/04/2018 MRI head. 07/04/2009 CT angiogram of the neck. FINDINGS: Internal carotid arteries: Patent left. No flow related signal in the right internal carotid artery compatible with occlusion excepting the patent terminus. Anterior cerebral arteries: Patent. Large left A1, small right A1, large anterior communicating artery, normal variant. Middle cerebral arteries: Patent. Anterior communicating artery: Patent. Posterior communicating arteries: Patent. Large right and diminutive left. Posterior cerebral arteries:  Patent. Basilar artery:  Patent. Vertebral arteries:  Patent.  Left dominant. No additional evidence of high-grade stenosis, large vessel occlusion, or aneurysm. IMPRESSION: 1. Chronic right ICA occlusion with patent terminus. Right MCA distribution collateralization from anterior  and right posterior communicating arteries. 2. No additional evidence for stenosis, large vessel occlusion, or aneurysm. Electronically Signed   By: Kristine Garbe M.D.   On: 05/04/2018 21:45   Ct Head Code Stroke Wo Contrast  Result Date: 05/04/2018 CLINICAL DATA:  Code stroke.  Right arm weakness EXAM: CT HEAD WITHOUT CONTRAST TECHNIQUE: Contiguous axial images were obtained from the base of the skull through the vertex without intravenous contrast. COMPARISON:  04/06/2018 brain MRI FINDINGS: Brain: Known meningiomas along the left anterior clinoid and parasagittal right frontal parietal convexity. These were recently characterized with brain MRI. Vasogenic edema in the right posterior frontal white matter is unchanged and mild.  Extensive encephalomalacia in the left frontal lobe, insula, and superior temporal lobe deep to a craniotomy and cranioplasty that could be post-ischemic, compressive, or postoperative. There is no evidence of superimposed infarct or hemorrhage. No hydrocephalus. Vascular: No hyperdense vessel Skull: Cranioplasty on the left that is unremarkable Sinuses/Orbits: Negative Other: These results were called by telephone at the time of interpretation on 05/04/2018 at 10:44 am to Dr. Shirlyn Goltz , who verbally acknowledged these results. ASPECTS [facility] Stroke Program Early CT Score) Not scored given the extent of chronic change. IMPRESSION: 1. No acute finding. 2. Known intracranial meningiomas with stable mild vasogenic edema at the right frontal parietal convexity. 3. Extensive left cerebral encephalomalacia. Electronically Signed   By: Monte Fantasia M.D.   On: 05/04/2018 10:47   Vas US Carotid (at [facility] Only)  Result Date: 05/06/2018 Carotid Arterial Duplex Study Indications:       CVA, Weakness and Right arm weakness. Risk Factors:      Hypertension, prior CVA, PAD. Other Factors:     History of Left Carotid endarterectomy on 09/13/2001. Limitations:       shadowing from vessel wall calcifications Comparison Study:  Carotid on 08/16/2017 with occluded right ICA. Performing Technologist: Rudell Cobb  Examination Guidelines: A complete evaluation includes B-mode imaging, spectral Doppler, color Doppler, and power Doppler as needed of all accessible portions of each vessel. Bilateral testing is considered an integral part of a complete examination. Limited examinations for reoccurring indications may be performed as noted.  Right Carotid Findings: +----------+--------+-------+--------+--------------------------------+--------+           PSV cm/sEDV    StenosisDescribe                        Comments                   cm/s                                                     +----------+--------+-------+--------+--------------------------------+--------+ CCA Prox  227                    diffuse, hyperechoic and                                                  calcific                                 +----------+--------+-------+--------+--------------------------------+--------+ CCA  Mid   106                    diffuse, hypoechoic and                                                   irregular                                +----------+--------+-------+--------+--------------------------------+--------+ CCA Distal95                                                              +----------+--------+-------+--------+--------------------------------+--------+ ICA Prox                 Occluded                                         +----------+--------+-------+--------+--------------------------------+--------+ ICA Mid                  Occluded                                         +----------+--------+-------+--------+--------------------------------+--------+ ICA Distal               Occluded                                         +----------+--------+-------+--------+--------------------------------+--------+ ECA       180     20                                                      +----------+--------+-------+--------+--------------------------------+--------+ +----------+--------+-------+--------+-------------------+           PSV cm/sEDV cmsDescribeArm Pressure (mmHG) +----------+--------+-------+--------+-------------------+ [id number]                                        +----------+--------+-------+--------+-------------------+ +---------+--------+--+--------+--+---------+ VertebralPSV cm/s61EDV cm/s14Antegrade +---------+--------+--+--------+--+---------+  Left Carotid Findings: +----------+--------+-------+--------+--------------------------------+--------+           PSV cm/sEDV     StenosisDescribe                        Comments                   cm/s                                                    +----------+--------+-------+--------+--------------------------------+--------+ CCA  Prox  109     19             hypoechoic and diffuse                   +----------+--------+-------+--------+--------------------------------+--------+ CCA Mid   117     24                                                      +----------+--------+-------+--------+--------------------------------+--------+ CCA Distal117     23             diffuse, hyperechoic and                                                  calcific                                 +----------+--------+-------+--------+--------------------------------+--------+ ICA Prox  228     35     40-59%                                           +----------+--------+-------+--------+--------------------------------+--------+ ICA Mid   118     26                                                      +----------+--------+-------+--------+--------------------------------+--------+ ICA Distal199     36                                                      +----------+--------+-------+--------+--------------------------------+--------+ ECA       65      14                                                      +----------+--------+-------+--------+--------------------------------+--------+ +----------+--------+--------+------------------+-------------------+ SubclavianPSV cm/sEDV cm/sDescribe          Arm Pressure (mmHG) +----------+--------+--------+------------------+-------------------+           360             hyperechoic plaque                    +----------+--------+--------+------------------+-------------------+ +---------+--------+---+--------+--+---------+ VertebralPSV cm/s140EDV cm/s30Antegrade +---------+--------+---+--------+--+---------+ A hyperechoic round lesion  seen at the left mid ICA with measurement of 0.61x0.46cm. Etiology unknown.  Summary: Right Carotid: Evidence consistent with a total occlusion of the right ICA. Left Carotid: Velocities in the left ICA are consistent with a 40-59% stenosis.               Incidental finding: A hyperechoic round lesion seen at the left  mid ICA with measurement of 0.61x0.46cm. Etiology unknown and not               documented on last study on 08/16/2017. Vertebrals: Bilateral vertebral arteries demonstrate antegrade flow. *See table(s) above for measurements and observations.  Electronically signed by Monica Martinez MD on 05/06/2018 at 10:51:28 AM.    Final     Micro Results     No results found for this or any previous visit (from the past 240 hour(s)).  Today   Subjective    Darwin Rothlisberger today has no headache,no chest abdominal pain,no new weakness tingling or numbness, feels much better wants to go home today.     Objective   Blood pressure 122/63, pulse (!) 50, temperature 98.1 F (36.7 C), temperature source Oral, resp. rate 18, height 5\' 10"  (1.778 m), weight 99.8 kg, SpO2 96 %.   Intake/Output Summary (Last 24 hours) at 05/06/2018 1207 Last data filed at 05/06/2018 0804 Gross per 24 hour  Intake 600 ml  Output -  Net 600 ml    Exam  Awake Alert, Oriented x 3, No new F.N deficits, Normal affect [place].AT,PERRAL Supple Neck,No JVD, No cervical lymphadenopathy appriciated.  Symmetrical Chest wall movement, Good air movement bilaterally, CTAB RRR,No Gallops,Rubs or new Murmurs, No Parasternal Heave +ve B.Sounds, Abd Soft, Non tender, No organomegaly appriciated, No rebound -guarding or rigidity. No Cyanosis, Clubbing or edema, No new Rash or bruise   Data Review   CBC w Diff:  Lab Results  Component Value Date   WBC 12.4 (H) 05/04/2018   HGB 17.7 (H) 05/04/2018   HCT 52.0 05/04/2018   PLT 162 05/04/2018   LYMPHOPCT 16 05/04/2018   MONOPCT 10 05/04/2018   EOSPCT 1  05/04/2018   BASOPCT 0 05/04/2018    CMP:  Lab Results  Component Value Date   NA 140 05/06/2018   K 4.6 05/06/2018   CL 105 05/06/2018   CO2 26 05/06/2018   BUN 29 (H) 05/06/2018   CREATININE 1.86 (H) 05/06/2018   PROT 6.7 05/04/2018   ALBUMIN 4.0 05/04/2018   BILITOT 1.6 (H) 05/04/2018   ALKPHOS 80 05/04/2018   AST 23 05/04/2018   ALT 28 05/04/2018  .  Lab Results  Component Value Date   HGBA1C 6.1 (H) 05/05/2018   Lab Results  Component Value Date   CHOL 125 05/05/2018   HDL 27 (L) 05/05/2018   LDLCALC 58 05/05/2018   TRIG 200 (H) 05/05/2018   CHOLHDL 4.6 05/05/2018    Total Time in preparing paper work, data evaluation and todays exam - 23 minutes  Lala Lund M.D on 05/06/2018 at 12:07 PM  [facility]   Office  [phone]

## 2018-05-06 NOTE — Progress Notes (Signed)
[name] to be D/C'd  Home with home health per MD order. Discussed with the patient and wife Juliann Pulse and all questions fully answered. VVS, Skin clean, dry and intact without evidence of skin break down, no evidence of skin tears noted.  IV catheter discontinued intact. Site without signs and symptoms of complications. Dressing and pressure applied.  An After Visit Summary was printed and given to the patient.  Patient escorted via [place], and D/C home via private auto.  [name]  05/06/2018 2:30 PM

## 2018-05-06 NOTE — Progress Notes (Signed)
ANTICOAGULATION CONSULT NOTE - Initial Consult  Pharmacy Consult for Apixaban Indication: atrial fibrillation  Allergies  Allergen Reactions  . Immune Globulins Other (See Comments)    Tetanus Immunes Globulins    (  Pt. Cannot take the Flu shot ) Guillian Barre   . Influenza Vaccines Other (See Comments)    Guillian Barre [name]  . Rho (D) Immune Globulin Other (See Comments)    Tetanus Immunes Globulins(Pt. Cannot take the Flu shot ) [name]   . Aspirin Other (See Comments)    Severe skin bruising  Severe skin bruising     Patient Measurements: Height: 5\' 10"  (177.8 cm) Weight: 220 lb (99.8 kg) IBW/kg (Calculated) : 73   Vital Signs: Temp: 98.1 F (36.7 C) (12/21 0753) Temp Source: Oral (12/21 0753) BP: 122/63 (12/21 0753) Pulse Rate: 50 (12/21 0753)  Labs: Recent Labs    05/04/18 1007 05/04/18 1009 05/04/18 1825 05/05/18 0921 05/06/18 0332  HGB 16.7 17.7*  --   --   --   HCT 53.9* 52.0  --   --   --   PLT 162  --   --   --   --   APTT 33  --   --   --   --   LABPROT 22.6*  --   --  21.6* 23.0*  INR 2.02  --   --  1.91 2.07  CREATININE 1.71* 1.70*  --  2.03* 1.86*  TROPONINI  --   --  <0.03  --   --     Estimated Creatinine Clearance: 39.4 mL/min (A) (by C-G formula based on SCr of 1.86 mg/dL (H)).   Medical History: Past Medical History:  Diagnosis Date  . Atrial fibrillation ([place]) 02/2016   NEW ONSET  . Brain tumor (benign) ([place]) 1998   2 small tumors- since 2015 (Dr. [name] following)  . Carotid artery occlusion   . Cerebrovascular disease September 09, 2001   TIA, Left brain  . CKD (chronic kidney disease), stage III ([place])   . Diverticulosis   . ED (erectile dysfunction)   . Guillain-Barre syndrome ([place]) 1999  . Hx of elevated lipids   . Hyperlipidemia   . Hypertension   . Memory loss   . Stroke [facility]) 2003    Assessment: 77 yo male with atrial fibrillation who was on warfarin, but will now transition to apixaban per MD.  Patient does not qualify for dose adjustment.   Goal of Therapy:  Monitor platelets by anticoagulation protocol: Yes   Plan:  Apixaban 5mg  PO BID Monitor for bleeding  Sowmya Partridge A. Levada Dy, PharmD, [place] Pager: [phone] Please utilize Amion for appropriate phone number to reach the unit pharmacist ([place])    05/06/2018,10:58 AM

## 2018-05-06 NOTE — Discharge Instructions (Signed)
Follow with Primary MD Lavone Orn, MD in 7 days   Get CBC, CMP checked  by Primary MD or SNF MD in 5-7 days   Activity: As tolerated with Full fall precautions use walker/cane & assistance as needed  Disposition Home   Diet: Heart Healthy   Special Instructions: If you have smoked or chewed Tobacco  in the last 2 yrs please stop smoking, stop any regular Alcohol  and or any Recreational drug use.  On your next visit with your primary care physician please Get Medicines reviewed and adjusted.  Please request your Prim.MD to go over all Hospital Tests and Procedure/Radiological results at the follow up, please get all Hospital records sent to your Prim MD by signing hospital release before you go home.  If you experience worsening of your admission symptoms, develop shortness of breath, life threatening emergency, suicidal or homicidal thoughts you must seek medical attention immediately by calling 911 or calling your MD immediately  if symptoms less severe.  You Must read complete instructions/literature along with all the possible adverse reactions/side effects for all the Medicines you take and that have been prescribed to you. Take any new Medicines after you have completely understood and accpet all the possible adverse reactions/side effects.   Information on my medicine - ELIQUIS (apixaban)  This medication education was reviewed with me or my healthcare representative as part of my discharge preparation.   Why was Eliquis prescribed for you? Eliquis was prescribed for you to reduce the risk of a blood clot forming that can cause a stroke if you have a medical condition called atrial fibrillation (a type of irregular heartbeat).  What do You need to know about Eliquis ? Take your Eliquis TWICE DAILY - one tablet in the morning and one tablet in the evening with or without food. If you have difficulty swallowing the tablet whole please discuss with your pharmacist how to take  the medication safely.  Take Eliquis exactly as prescribed by your doctor and DO NOT stop taking Eliquis without talking to the doctor who prescribed the medication.  Stopping may increase your risk of developing a stroke.  Refill your prescription before you run out.  After discharge, you should have regular check-up appointments with your healthcare provider that is prescribing your Eliquis.  In the future your dose may need to be changed if your kidney function or weight changes by a significant amount or as you get older.  What do you do if you miss a dose? If you miss a dose, take it as soon as you remember on the same day and resume taking twice daily.  Do not take more than one dose of ELIQUIS at the same time to make up a missed dose.  Important Safety Information A possible side effect of Eliquis is bleeding. You should call your healthcare provider right away if you experience any of the following: ? Bleeding from an injury or your nose that does not stop. ? Unusual colored urine (red or dark brown) or unusual colored stools (red or black). ? Unusual bruising for unknown reasons. ? A serious fall or if you hit your head (even if there is no bleeding).  Some medicines may interact with Eliquis and might increase your risk of bleeding or clotting while on Eliquis. To help avoid this, consult your healthcare provider or pharmacist prior to using any new prescription or non-prescription medications, including herbals, vitamins, non-steroidal anti-inflammatory drugs (NSAIDs) and supplements.  This website has more  information on Eliquis (apixaban): http://www.eliquis.com/eliquis/home

## 2018-05-06 NOTE — Consult Note (Signed)
Hospital Consult    Reason for Consult:  L MCA infarct  Referring Physician:  Hospitalist MRN #:  [id number]  History of Present Illness: This is a 77 y.o. male with hx atrial fibrillation on Coumadin, hypertension, hyperlipidemia, DM, guillian Barr syndrome, menigioma that vascular surgery was asked to comment on his CTA after being admitted with a left small MCA infarct in a watershed distribution.  Patient previous underwent a left carotid endarterectomy by Dr. early in early 2000.  States he has been doing well up until Thursday when he noticed he had weakness in his right hand.  Ultimately was admitted with an MRI that showed a small left MCA infarct in the watershed distribution.  CT neck was subsequently obtained where there was concern for approximate 50% stenosis of the distal common where the carotid patch began.  Vascular surgery subsequently asked to evaluate.  In discussing with the patient he states he was only taking Coumadin at the time of stroke.  He denies on being on any antiplatelet agents. Chronically occluded R carotid.  L ICA velicty 619/50.  Past Medical History:  Diagnosis Date  . Atrial fibrillation ([place]) 02/2016   NEW ONSET  . Brain tumor (benign) ([place]) 1998   2 small tumors- since 2015 (Dr. [name] following)  . Carotid artery occlusion   . Cerebrovascular disease September 09, 2001   TIA, Left brain  . CKD (chronic kidney disease), stage III ([place])   . Diverticulosis   . ED (erectile dysfunction)   . Guillain-Barre syndrome ([place]) 1999  . Hx of elevated lipids   . Hyperlipidemia   . Hypertension   . Memory loss   . Stroke [facility]) 2003    Past Surgical History:  Procedure Laterality Date  . BRAIN SURGERY  1998   Benign brain tumor removed, Left hemisphere- ? Meningioma  . CAROTID ENDARTERECTOMY Left September 13, 2001   LEFT cea  . CHOLECYSTECTOMY  Nov. 2001  . COLON SURGERY  Feb. 2003   Colonic polyps removed endoscopically  . COLONOSCOPY WITH PROPOFOL N/A  11/11/2015   Procedure: COLONOSCOPY WITH PROPOFOL;  Surgeon: Garlan Fair, MD;  Location: [facility] ENDOSCOPY;  Service: Endoscopy;  Laterality: N/A;  . INGUINAL HERNIA REPAIR  1970's    Allergies  Allergen Reactions  . Immune Globulins Other (See Comments)    Tetanus Immunes Globulins    (  Pt. Cannot take the Flu shot ) Guillian Barre   . Influenza Vaccines Other (See Comments)    Guillian Barre [name]  . Rho (D) Immune Globulin Other (See Comments)    Tetanus Immunes Globulins(Pt. Cannot take the Flu shot ) [name]   . Aspirin Other (See Comments)    Severe skin bruising  Severe skin bruising     Prior to Admission medications   Medication Sig Start Date End Date Taking? Authorizing Provider  acetaminophen (TYLENOL) 500 MG tablet Take 1,000 mg by mouth every 8 (eight) hours as needed for moderate pain.   Yes [provider]  cholecalciferol (VITAMIN D) 1000 units tablet Take 1,000 Units by mouth daily.   Yes [provider]  DILT-XR 180 MG 24 hr capsule Take 180 mg by mouth daily after breakfast.  03/27/18  Yes [provider]  lisinopril-hydrochlorothiazide (PRINZIDE,ZESTORETIC) 10-12.5 MG tablet Take 1 tablet by mouth daily.   Yes [provider]  rosuvastatin (CRESTOR) 20 MG tablet Take 20 mg by mouth every evening.    Yes [provider]  vitamin B-12 (CYANOCOBALAMIN)  1000 MCG tablet Take 1,000 mcg by mouth 2 (two) times a week. Wednesday and Saturday   Yes [provider]  warfarin (COUMADIN) 5 MG tablet Take as directed by Coumadin Clinic Patient taking differently: Take 2.5-5 mg by mouth See admin instructions. Take 5 mg  tues, thurs, sat, and 2.5 mg all other days. 12/21/17  Yes Camnitz, Ocie Doyne, MD  apixaban (ELIQUIS) 5 MG TABS tablet Take 1 tablet (5 mg total) by mouth 2 (two) times daily. 05/06/18   Thurnell Lose, MD  aspirin 81 MG chewable tablet Chew 1 tablet (81 mg total) by mouth daily.  05/06/18   Thurnell Lose, MD  pantoprazole (PROTONIX) 40 MG tablet Take 1 tablet (40 mg total) by mouth daily. 05/06/18   Thurnell Lose, MD    Social History   Socioeconomic History  . Marital status: Married    Spouse name: Not on file  . Number of children: Not on file  . Years of education: Not on file  . Highest education level: Not on file  Occupational History  . Not on file  Social Needs  . Financial resource strain: Not on file  . Food insecurity:    Worry: Not on file    Inability: Not on file  . Transportation needs:    Medical: Not on file    Non-medical: Not on file  Tobacco Use  . Smoking status: Former Smoker    Types: Cigarettes    Last attempt to quit: 05/17/1996    Years since quitting: 21.9  . Smokeless tobacco: Never Used  Substance and Sexual Activity  . Alcohol use: Yes    Alcohol/week: 1.0 standard drinks    Types: 1 Cans of beer per week    Comment: every few months  . Drug use: No  . Sexual activity: Not on file  Lifestyle  . Physical activity:    Days per week: Not on file    Minutes per session: Not on file  . Stress: Not on file  Relationships  . Social connections:    Talks on phone: Not on file    Gets together: Not on file    Attends religious service: Not on file    Active member of club or organization: Not on file    Attends meetings of clubs or organizations: Not on file    Relationship status: Not on file  . Intimate partner violence:    Fear of current or ex partner: Not on file    Emotionally abused: Not on file    Physically abused: Not on file    Forced sexual activity: Not on file  Other Topics Concern  . Not on file  Social History Narrative  . Not on file     Family History  Problem Relation Age of Onset  . Colon cancer Mother        Metastatic   . Stroke Father   . Diabetes Father     ROS: [x]  Positive   [ ]  Negative   [ ]  All sytems reviewed and are negative  Cardiovascular: []  chest  pain/pressure []  palpitations []  SOB lying flat []  DOE []  pain in legs while walking []  pain in legs at rest []  pain in legs at night []  non-healing ulcers []  hx of DVT []  swelling in legs  Pulmonary: []  productive cough []  asthma/wheezing []  home O2  Neurologic: [x]  weakness in [x]  arms []  legs []  numbness in []  arms []  legs []  hx  of CVA []  mini stroke [] difficulty speaking or slurred speech []  temporary loss of vision in one eye []  dizziness  Hematologic: []  hx of cancer []  bleeding problems []  problems with blood clotting easily  Endocrine:   []  diabetes []  thyroid disease  GI []  vomiting blood []  blood in stool  GU: []  CKD/renal failure []  HD--[]  M/W/F or []  T/T/S []  burning with urination []  blood in urine  Psychiatric: []  anxiety []  depression  Musculoskeletal: []  arthritis []  joint pain  Integumentary: []  rashes []  ulcers  Constitutional: []  fever []  chills   Physical Examination  Vitals:   05/06/18 0416 05/06/18 0753  BP: (!) 97/48 122/63  Pulse: (!) 45 (!) 50  Resp: 18 18  Temp: 98.4 F (36.9 C) 98.1 F (36.7 C)  SpO2: 97% 96%   Body mass index is 31.57 kg/m.  General:  WDWN in NAD Gait: Not observed HENT: WNL, normocephalic Pulmonary: normal non-labored breathing, without Rales, rhonchi,  wheezing Cardiac: Irregualr irregular rhythm Abdomen: soft, NT/ND, no masses Extremities: without ischemic changes, without Gangrene , without cellulitis; without open wounds;  Musculoskeletal: no muscle wasting or atrophy  Neurologic: A&O X 3; Appropriate Affect ; SENSATION: normal; MOTOR FUNCTION:  moving all extremities equally. Speech is fluent/normal   CBC    Component Value Date/Time   WBC 12.4 (H) 05/04/2018 1007   RBC 5.62 05/04/2018 1007   HGB 17.7 (H) 05/04/2018 1009   HCT 52.0 05/04/2018 1009   PLT 162 05/04/2018 1007   MCV 95.9 05/04/2018 1007   MCH 29.7 05/04/2018 1007   MCHC 31.0 05/04/2018 1007   RDW 14.1 05/04/2018  1007   LYMPHSABS 2.0 05/04/2018 1007   MONOABS 1.2 (H) 05/04/2018 1007   EOSABS 0.1 05/04/2018 1007   BASOSABS 0.0 05/04/2018 1007    BMET    Component Value Date/Time   NA 140 05/06/2018 0332   K 4.6 05/06/2018 0332   CL 105 05/06/2018 0332   CO2 26 05/06/2018 0332   GLUCOSE 113 (H) 05/06/2018 0332   BUN 29 (H) 05/06/2018 0332   CREATININE 1.86 (H) 05/06/2018 0332   CALCIUM 8.6 (L) 05/06/2018 0332   GFRNONAA 34 (L) 05/06/2018 0332   GFRAA 40 (L) 05/06/2018 0332    COAGS: Lab Results  Component Value Date   INR 2.07 05/06/2018   INR 1.91 05/05/2018   INR 2.02 05/04/2018     ASSESSMENT/PLAN: This is a 77 y.o. male admitted with small left MCA infarct in watershed distribution of unclear etiology.  I reviewed his CTA neck and he has a chronically occluded right carotid artery.  His left carotid endarterectomy patch appears widely patent with a patent left ICA.  There is some disease in the distal left common carotid where the patch starts with about a 50% stenosis.  Would favor adding antiplatelet agent either aspirin or Plavix to optimize his medical management.  Also planning to switch him to Eliquis from coumadin in setting of chronic afib.  He can follow-up with Dr. Donnetta Hutching in clinic.  Discussed if this reoccurs he needs to return immediately.    Marty Heck, MD [facility] Office: [phone] Pager: [phone]

## 2018-05-06 NOTE — Care Management Note (Signed)
Case Management Note  Patient Details  Name: [name] MRN: [id number] Date of Birth: 06-Jan-1941  Subjective/Objective:   For dc home, NCM gave patient the 30 day savings eliquis coupon and set him up with outpatient pt with neuro rehab.                  Action/Plan: DC home when ready.  Expected Discharge Date:  05/06/18               Expected Discharge Plan:  OP Rehab  In-House Referral:     Discharge planning Services  CM Consult, Medication Assistance  Post Acute Care Choice:    Choice offered to:     DME Arranged:    DME Agency:     HH Arranged:    HH Agency:     Status of Service:  Completed, signed off  If discussed at [employer] of Stay Meetings, dates discussed:    Additional Comments:  [name], RN 05/06/2018, 1:03 PM

## 2018-05-08 ENCOUNTER — Telehealth: Payer: Self-pay | Admitting: Vascular Surgery

## 2018-05-08 NOTE — Telephone Encounter (Signed)
sch appt spk to pt mld ltr 05/30/2018 1045am f/u MD

## 2018-05-08 NOTE — Telephone Encounter (Signed)
-----   Message from Marty Heck, MD sent at 05/06/2018 12:46 PM EST ----- Level 3 inpatient consult.  Can we arrange follow-up with Dr. Donnetta Hutching in 2-3 weeks in clinic?  Thanks,  Gerald Stabs

## 2018-05-15 ENCOUNTER — Encounter: Payer: Self-pay | Admitting: *Deleted

## 2018-05-16 ENCOUNTER — Encounter: Payer: Self-pay | Admitting: Cardiology

## 2018-05-16 ENCOUNTER — Ambulatory Visit: Payer: PPO | Admitting: Cardiology

## 2018-05-16 VITALS — BP 132/68 | HR 73 | Ht 70.0 in | Wt 230.0 lb

## 2018-05-16 DIAGNOSIS — I10 Essential (primary) hypertension: Secondary | ICD-10-CM

## 2018-05-16 DIAGNOSIS — I639 Cerebral infarction, unspecified: Secondary | ICD-10-CM | POA: Diagnosis not present

## 2018-05-16 DIAGNOSIS — I480 Paroxysmal atrial fibrillation: Secondary | ICD-10-CM | POA: Diagnosis not present

## 2018-05-16 NOTE — Progress Notes (Signed)
Electrophysiology Office Note   Date:  05/16/2018   ID:  [name], [name] 12/12/1940, MRN [id number]  PCP:  Lavone Orn, MD  Primary Electrophysiologist:  Dr Curt Bears    CC: Follow up for atrial fibrillation.   History of Present Illness: [name] is a 77 y.o. male who presents today for electrophysiology evaluation.   He was admitted to the hospital at the end of October 2016 with new onset atrial fibrillation, renal failure, left knee cellulitis. He was rate controlled with 120 mg of diltiazem as well as metoprolol and anticoagulated with warfarin.  He was subsequently admitted to hospital 05/06/2018 with an acute posterior left MCA watershed infarct and small acute infarct in the right centrum semi-ovale after presenting with right sided arm weakness.  His Coumadin was stopped and he was started on Eliquis and aspirin. He denies any residual neurologic symptoms. He has not had any symptomatic episodes of atrial fibrillation.  Today, denies symptoms of palpitations, chest pain, shortness of breath, orthopnea, PND, lower extremity edema, claudication, dizziness, presyncope, syncope, bleeding, or neurologic sequela. The patient is tolerating medications without difficulties.    Past Medical History:  Diagnosis Date  . Atrial fibrillation ([place]) 02/2016   NEW ONSET  . Brain tumor (benign) ([place]) 1998   2 small tumors- since 2015 (Dr. [name] following)  . Carotid artery occlusion   . Cerebrovascular disease September 09, 2001   TIA, Left brain  . CKD (chronic kidney disease), stage III ([place])   . Diverticulosis   . ED (erectile dysfunction)   . Guillain-Barre syndrome ([place]) 1999  . Hx of elevated lipids   . Hyperlipidemia   . Hypertension   . Memory loss   . Stroke [facility]) 2003   Past Surgical History:  Procedure Laterality Date  . BRAIN SURGERY  1998   Benign brain tumor removed, Left hemisphere- ? Meningioma  . CAROTID ENDARTERECTOMY Left September 13, 2001   LEFT cea    . CHOLECYSTECTOMY  Nov. 2001  . COLON SURGERY  Feb. 2003   Colonic polyps removed endoscopically  . COLONOSCOPY WITH PROPOFOL N/A 11/11/2015   Procedure: COLONOSCOPY WITH PROPOFOL;  Surgeon: Garlan Fair, MD;  Location: [facility] ENDOSCOPY;  Service: Endoscopy;  Laterality: N/A;  . INGUINAL HERNIA REPAIR  1970's     Current Outpatient Medications  Medication Sig Dispense Refill  . acetaminophen (TYLENOL) 500 MG tablet Take 1,000 mg by mouth every 8 (eight) hours as needed for moderate pain.    Marland Kitchen apixaban (ELIQUIS) 5 MG TABS tablet Take 1 tablet (5 mg total) by mouth 2 (two) times daily. 60 tablet 0  . aspirin 81 MG chewable tablet Chew 1 tablet (81 mg total) by mouth daily. 30 tablet 0  . cholecalciferol (VITAMIN D) 1000 units tablet Take 1,000 Units by mouth daily.    Marland Kitchen DILT-XR 180 MG 24 hr capsule Take 180 mg by mouth daily after breakfast.   5  . lisinopril-hydrochlorothiazide (PRINZIDE,ZESTORETIC) 10-12.5 MG tablet Take 1 tablet by mouth daily.    . pantoprazole (PROTONIX) 40 MG tablet Take 1 tablet (40 mg total) by mouth daily. 30 tablet 0  . rosuvastatin (CRESTOR) 20 MG tablet Take 20 mg by mouth every evening.     . vitamin B-12 (CYANOCOBALAMIN) 1000 MCG tablet Take 1,000 mcg by mouth 2 (two) times a week. Wednesday and Saturday     No current facility-administered medications for this visit.     Allergies:   Immune globulins; Influenza vaccines; Rho (  d) immune globulin; and Aspirin   Social History:  The patient  reports that he quit smoking about 22 years ago. His smoking use included cigarettes. He has never used smokeless tobacco. He reports current alcohol use of about 1.0 standard drinks of alcohol per week. He reports that he does not use drugs.   Family History:  The patient's family history includes CVA in his father; Colon cancer in his mother; Diabetes in his father; Stroke in his father.   ROS:  Please see the history of present illness.  All other systems are reviewed  and negative.   PHYSICAL EXAM: VS:  BP 132/68   Pulse 73   Ht 5\' 10"  (1.778 m)   Wt 230 lb (104.3 kg)   SpO2 97%   BMI 33.00 kg/m  , BMI Body mass index is 33 kg/m. GEN: Well nourished, well developed, in no acute distress  HEENT: normal  Neck: no JVD, carotid bruits, or masses Cardiac: RRR; no murmurs, rubs, or gallops,no edema  Respiratory:  clear to auscultation bilaterally, normal work of breathing GI: soft, nontender, nondistended, + BS MS: no deformity or atrophy  Skin: warm and dry Neuro:  Strength and sensation are intact Psych: euthymic mood, full affect  EKG:  EKG is not ordered today. Personal review of the ekg ordered 05/05/18 shows SR HR 63.   Recent Labs: 05/04/2018: ALT 28; Hemoglobin 17.7; Platelets 162 05/06/2018: BUN 29; Creatinine, Ser 1.86; Potassium 4.6; Sodium 140    Lipid Panel     Component Value Date/Time   CHOL 125 05/05/2018 0552   TRIG 200 (H) 05/05/2018 0552   HDL 27 (L) 05/05/2018 0552   CHOLHDL 4.6 05/05/2018 0552   VLDL 40 05/05/2018 0552   LDLCALC 58 05/05/2018 0552     Wt Readings from Last 3 Encounters:  05/16/18 230 lb (104.3 kg)  05/04/18 220 lb (99.8 kg)  04/12/18 226 lb 6.4 oz (102.7 kg)      Other studies Reviewed: Additional studies/ records that were reviewed today include: TTE 03/07/16  Review of the above records today demonstrates:  - Left ventricle: The cavity size was normal. Wall thickness was   normal. Systolic function was normal. The estimated ejection   fraction was in the range of 60% to 65%. Wall motion was normal;   there were no regional wall motion abnormalities. - Aortic valve: Valve area (VTI): 1.68 cm^2. Valve area (Vmax):   1.71 cm^2. Valve area (Vmean): 1.68 cm^2. - Left atrium: The atrium was mildly dilated. - Right atrium: The atrium was mildly dilated.  Echo 05/05/18 Left ventricle: The cavity size was normal. Wall thickness was   increased in a pattern of mild LVH. Systolic function was  normal.   The estimated ejection fraction was in the range of 50% to 55%.   Wall motion was normal; there were no regional wall motion   abnormalities. Doppler parameters are consistent with abnormal   left ventricular relaxation (grade 1 diastolic dysfunction). - Mitral valve: Calcified annulus.   ASSESSMENT AND PLAN:  1.  Paroxysmal Atrial fibrillation: Now on Eliquis after possible Coumadin failure? INR therapeutic at time of admission. Etiology of CVA unknown.  He is not on rate controlling medications.  He has had no further episodes of atrial fibrillation.  No changes at this time.    This patients CHA2DS2-VASc Score and unadjusted Ischemic Stroke Rate (% per year) is equal to 7.2 % stroke rate/year from a score of 5  Above score calculated  as 1 point each if present [CHF, HTN, DM, Vascular=MI/PAD/Aortic Plaque, Age if 65-74, or Male] Above score calculated as 2 points each if present [Age > 75, or Stroke/TIA/TE]  2. Hypertension: Stable today, no changes  3. CVA: Continue ASA, statin, and Eliquis   Current medicines are reviewed at length with the patient today.   The patient does not have concerns regarding his medicines.  The following changes were made today: none  Labs/ tests ordered today include:  No orders of the defined types were placed in this encounter.    Disposition:   FU with [name] 6 months  [name], [place]  05/16/2018 1:27 PM     [facility] [address] [place] [address] [phone] (office) [phone] (fax)  I have seen and examined this patient with [name].  Agree with above, note added to reflect my findings.  On exam, RRR, no murmurs, lungs clear.  Fortunately, the patient was admitted to the hospital December 2019 with a CVA.  He was on Coumadin at the time.  His Coumadin was switched to Eliquis and he was started on aspirin as he does have carotid artery disease.  Since that time, he  has had improvement in his symptoms which were right arm and hand weakness.  He feels well at this time.  We [name] make no changes to his medical therapy.  Ross Mcfarren M. Ross Delangel MD 05/16/2018 1:49 PM

## 2018-05-16 NOTE — Patient Instructions (Addendum)
Medication Instructions:  Your physician recommends that you continue on your current medications as directed. Please refer to the Current Medication list given to you today.  If you need a refill on your cardiac medications before your next appointment, please call your pharmacy.   Labwork: None ordered  Testing/Procedures: None ordered  Follow-Up: Your physician wants you to follow-up in: 6 months with Dr. Camnitz.  You will receive a reminder letter in the mail two months in advance. If you don't receive a letter, please call our office to schedule the follow-up appointment.  Thank you for choosing [facility]!!   Klaryssa Fauth, RN [phone]         

## 2018-05-24 DIAGNOSIS — I639 Cerebral infarction, unspecified: Secondary | ICD-10-CM | POA: Diagnosis not present

## 2018-05-24 DIAGNOSIS — I129 Hypertensive chronic kidney disease with stage 1 through stage 4 chronic kidney disease, or unspecified chronic kidney disease: Secondary | ICD-10-CM | POA: Diagnosis not present

## 2018-05-24 DIAGNOSIS — I480 Paroxysmal atrial fibrillation: Secondary | ICD-10-CM | POA: Diagnosis not present

## 2018-05-24 DIAGNOSIS — E782 Mixed hyperlipidemia: Secondary | ICD-10-CM | POA: Diagnosis not present

## 2018-05-24 DIAGNOSIS — H6123 Impacted cerumen, bilateral: Secondary | ICD-10-CM | POA: Diagnosis not present

## 2018-05-24 DIAGNOSIS — H9203 Otalgia, bilateral: Secondary | ICD-10-CM | POA: Diagnosis not present

## 2018-05-30 ENCOUNTER — Other Ambulatory Visit: Payer: Self-pay

## 2018-05-30 ENCOUNTER — Other Ambulatory Visit: Payer: Self-pay | Admitting: *Deleted

## 2018-05-30 ENCOUNTER — Encounter: Payer: Self-pay | Admitting: Vascular Surgery

## 2018-05-30 ENCOUNTER — Ambulatory Visit: Payer: PPO | Admitting: Vascular Surgery

## 2018-05-30 VITALS — BP 109/69 | HR 72 | Temp 96.8°F | Resp 18 | Ht 70.0 in | Wt 225.0 lb

## 2018-05-30 DIAGNOSIS — Z9889 Other specified postprocedural states: Secondary | ICD-10-CM | POA: Diagnosis not present

## 2018-05-30 MED ORDER — APIXABAN 5 MG PO TABS: 5.0000 mg | ORAL_TABLET | Freq: Two times a day (BID) | ORAL | 10 refills | Status: DC

## 2018-05-30 NOTE — Telephone Encounter (Signed)
Pt's wife called and stated that the pt needs a refill sent to their pharamcy for the pt's Eliquis. Eliqusis 5mg  refill request received; pt is 78 yrs old, wt-104.3kg, Crea-1.86 on 05/06/2018, last seen by Dr. Curt Bears on 05/16/2018; will send in refill to requested pharmacy.

## 2018-05-30 NOTE — Progress Notes (Signed)
[facility] [place]  Patient name: [name] MRN: [id number] DOB: [birth year] Sex: male  REASON FOR VISIT: Follow-up recent hospital visit with history of left carotid endarterectomy  HPI: [name] is a 78 y.o. male here for follow-up.  He was recently admitted to hospital after a transient ischemic event.  He has a very complex history.  He did undergo a left carotid endarterectomy by myself in 2003 after a TIA.  He had had no focal deficits.  He does have history of benign brain tumor and had these resected and also has had radiation therapy to these.  He does have Guyon Barr syndrome and has had a recent flareup of this which leaves him week following this.  On admission he reports that he was having a difficulty buttoning his shirt and his wife noted that he told her that he was unable to come down the stairs of their home.  When she went to check on him he said that he was better and was able to walk down the stairs and presented to [facility] and was transferred to [facility].  Extensive imaging at that time showed probable small acute cheerier left MCA watershed infarct and small acute infarct in the right centrum semiovale.  He was found to have chronic occlusion of his right internal carotid artery was known.  He had wide patency of his left internal carotid and bifurcation.  CT angiogram suggested a 50% weblike stenosis at the proximal portion of his endarterectomy.  His wife report that he is back to his baseline neurologic status.  Past Medical History:  Diagnosis Date  . Atrial fibrillation ([place]) 02/2016   NEW ONSET  . Brain tumor (benign) ([place]) 1998   2 small tumors- since 2015 (Dr. [name] following)  . Carotid artery occlusion   . Cerebrovascular disease September 09, 2001   TIA, Left brain  . CKD (chronic kidney disease), stage III ([place])   . Diverticulosis   . ED (erectile dysfunction)   .  Guillain-Barre syndrome ([place]) 1999  . Hx of elevated lipids   . Hyperlipidemia   . Hypertension   . Memory loss   . Stroke [facility]) 2003    Family History  Problem Relation Age of Onset  . Colon cancer Mother        Metastatic   . Stroke Father   . Diabetes Father   . CVA Father     SOCIAL HISTORY: Social History   Tobacco Use  . Smoking status: Former Smoker    Types: Cigarettes    Last attempt to quit: 05/17/1996    Years since quitting: 22.0  . Smokeless tobacco: Never Used  Substance Use Topics  . Alcohol use: Yes    Alcohol/week: 1.0 standard drinks    Types: 1 Cans of beer per week    Comment: every few months    Allergies  Allergen Reactions  . Immune Globulins Other (See Comments)    Tetanus Immunes Globulins    (  Pt. Cannot take the Flu shot ) Guillian Barre   . Influenza Vaccines Other (See Comments)    Guillian Barre [name]  . Rho (D) Immune Globulin Other (See Comments)    Tetanus Immunes Globulins(Pt. Cannot take the Flu shot ) [name]   . Aspirin Other (See Comments)    Severe skin bruising  Severe skin bruising     Current Outpatient Medications  Medication Sig Dispense Refill  .  acetaminophen (TYLENOL) 500 MG tablet Take 1,000 mg by mouth every 8 (eight) hours as needed for moderate pain.    Marland Kitchen apixaban (ELIQUIS) 5 MG TABS tablet Take 1 tablet (5 mg total) by mouth 2 (two) times daily. 60 tablet 10  . aspirin 81 MG chewable tablet Chew 1 tablet (81 mg total) by mouth daily. 30 tablet 0  . cholecalciferol (VITAMIN D) 1000 units tablet Take 1,000 Units by mouth daily.    Marland Kitchen DILT-XR 180 MG 24 hr capsule Take 180 mg by mouth daily after breakfast.   5  . lisinopril-hydrochlorothiazide (PRINZIDE,ZESTORETIC) 10-12.5 MG tablet Take 1 tablet by mouth daily.    . pantoprazole (PROTONIX) 40 MG tablet Take 1 tablet (40 mg total) by mouth daily. 30 tablet 0  . rosuvastatin (CRESTOR) 20 MG tablet Take 20 mg by mouth every evening.     .  vitamin B-12 (CYANOCOBALAMIN) 1000 MCG tablet Take 1,000 mcg by mouth 2 (two) times a week. Wednesday and Saturday     No current facility-administered medications for this visit.     REVIEW OF SYSTEMS:  [X]  denotes positive finding, [ ]  denotes negative finding Cardiac  Comments:  Chest pain or chest pressure:    Shortness of breath upon exertion:    Short of breath when lying flat:    Irregular heart rhythm:        Vascular    Pain in calf, thigh, or hip brought on by ambulation:    Pain in feet at night that wakes you up from your sleep:     Blood clot in your veins:    Leg swelling:           PHYSICAL EXAM: Vitals:   05/30/18 1031 05/30/18 1038  BP: 116/63 109/69  Pulse: 73 72  Resp: 18   Temp: (!) 96.8 F (36 C)   TempSrc: Oral   SpO2: 97%   Weight: 225 lb (102.1 kg)   Height: 5\' 10"  (1.778 m)     GENERAL: The patient is a well-nourished male, in no acute distress. The vital signs are documented above. CARDIOVASCULAR: Carotid arteries are without bruits bilaterally.  Well-healed left neck incision.  Radial pulses 2+ bilaterally PULMONARY: There is good air exchange  MUSCULOSKELETAL: There are no major deformities or cyanosis. NEUROLOGIC: No focal weakness or paresthesias are detected. SKIN: There are no ulcers or rashes noted. PSYCHIATRIC: The patient has a normal affect.  DATA:  I reviewed his images and reviewed his actual CT angiogram images with the patient and his wife present.  MEDICAL ISSUES: Recent transient episode of weakness possibly more affecting his right arm.  He has had complete resolution of this.  MRI suggested left MCA watershed infarct and right infarct as well.  Discussed the 50% weblike stenosis with otherwise unremarkable endarterectomy on the left side with contralateral right internal carotid artery occlusion.  Feel that it is very unlikely that this was the source of his event.  He had not been on antiplatelet therapy and has been placed  on this since this event.  Would repeat his duplex in 1 year to rule out any progression.  If he has new left hemisphere event, would consider redo endarterectomy.    Ross Posner, MD FACS [facility] Tel [phone] Pager [phone]

## 2018-07-17 DIAGNOSIS — M1711 Unilateral primary osteoarthritis, right knee: Secondary | ICD-10-CM | POA: Diagnosis not present

## 2018-07-27 DIAGNOSIS — M1711 Unilateral primary osteoarthritis, right knee: Secondary | ICD-10-CM | POA: Diagnosis not present

## 2018-08-03 DIAGNOSIS — M1711 Unilateral primary osteoarthritis, right knee: Secondary | ICD-10-CM | POA: Diagnosis not present

## 2018-09-07 DIAGNOSIS — M1711 Unilateral primary osteoarthritis, right knee: Secondary | ICD-10-CM | POA: Diagnosis not present

## 2018-09-07 DIAGNOSIS — K219 Gastro-esophageal reflux disease without esophagitis: Secondary | ICD-10-CM | POA: Diagnosis not present

## 2018-09-07 DIAGNOSIS — I129 Hypertensive chronic kidney disease with stage 1 through stage 4 chronic kidney disease, or unspecified chronic kidney disease: Secondary | ICD-10-CM | POA: Diagnosis not present

## 2018-09-07 DIAGNOSIS — I480 Paroxysmal atrial fibrillation: Secondary | ICD-10-CM | POA: Diagnosis not present

## 2018-09-07 DIAGNOSIS — N183 Chronic kidney disease, stage 3 (moderate): Secondary | ICD-10-CM | POA: Diagnosis not present

## 2018-10-06 ENCOUNTER — Other Ambulatory Visit: Payer: Self-pay

## 2018-10-06 ENCOUNTER — Ambulatory Visit
Admission: RE | Admit: 2018-10-06 | Discharge: 2018-10-06 | Disposition: A | Discharge status: Home / Self Care | Payer: PPO | Source: Ambulatory Visit | Attending: Internal Medicine | Admitting: Internal Medicine

## 2018-10-06 DIAGNOSIS — D320 Benign neoplasm of cerebral meninges: Secondary | ICD-10-CM

## 2018-10-06 MED ORDER — GADOBENATE DIMEGLUMINE 529 MG/ML IV SOLN
10.0000 mL | Freq: Once | INTRAVENOUS | Status: AC | PRN
  Administered 2018-10-06: 10 mL via INTRAVENOUS

## 2018-10-10 ENCOUNTER — Other Ambulatory Visit: Payer: Self-pay | Admitting: Radiation Therapy

## 2018-10-11 ENCOUNTER — Ambulatory Visit: Payer: PPO | Admitting: Internal Medicine

## 2018-10-12 ENCOUNTER — Other Ambulatory Visit: Payer: Self-pay

## 2018-10-12 ENCOUNTER — Inpatient Hospital Stay: Payer: PPO | Attending: Internal Medicine | Admitting: Internal Medicine

## 2018-10-12 ENCOUNTER — Telehealth: Payer: Self-pay | Admitting: Internal Medicine

## 2018-10-12 VITALS — BP 123/51 | HR 66 | Temp 98.4°F | Resp 20 | Ht 70.0 in | Wt 223.3 lb

## 2018-10-12 DIAGNOSIS — D320 Benign neoplasm of cerebral meninges: Secondary | ICD-10-CM

## 2018-10-12 DIAGNOSIS — Z87891 Personal history of nicotine dependence: Secondary | ICD-10-CM

## 2018-10-12 NOTE — Progress Notes (Signed)
[facility] at [place], [place] [address] [phone]   Interval Evaluation  Date of Service: 10/12/18 Patient Name: [name] Patient MRN: [id number] Patient DOB: 05/17/1941 Provider: Ventura Sellers, MD  Identifying Statement:  [name] is a 78 y.o. male with multifocal meningioma who presents for initial consultation and evaluation.    Referring Provider: Lavone Orn, MD [address] [place], [place] [address]  Oncologic History: March 1998: Left frontal meningioma resection [name]) 12/27/17: Completes fractionated SRS to recurrent left frontal meningioma  Interval History:  [name] presents today for follow up after recent MRI brain.  He and his wife describe no new or progressive neurologic deficits.  No seizures or headaches.  He is active at home during warm weather, riding his tractor.  No apparent decline in cognition or memory.  Medications: Current Outpatient Medications on File Prior to Visit  Medication Sig Dispense Refill   acetaminophen (TYLENOL) 500 MG tablet Take 1,000 mg by mouth every 8 (eight) hours as needed for moderate pain.     apixaban (ELIQUIS) 5 MG TABS tablet Take 1 tablet (5 mg total) by mouth 2 (two) times daily. 60 tablet 10   aspirin 81 MG chewable tablet Chew 1 tablet (81 mg total) by mouth daily. 30 tablet 0   cholecalciferol (VITAMIN D) 1000 units tablet Take 1,000 Units by mouth daily.     DILT-XR 180 MG 24 hr capsule Take 180 mg by mouth daily after breakfast.   5   lisinopril-hydrochlorothiazide (PRINZIDE,ZESTORETIC) 10-12.5 MG tablet Take 1 tablet by mouth daily.     pantoprazole (PROTONIX) 40 MG tablet Take 1 tablet (40 mg total) by mouth daily. 30 tablet 0   rosuvastatin (CRESTOR) 20 MG tablet Take 20 mg by mouth every evening.      vitamin B-12 (CYANOCOBALAMIN) 1000 MCG tablet Take 1,000 mcg by mouth 2 (two) times a week. Wednesday  and Saturday     No current facility-administered medications on file prior to visit.     Allergies:  Allergies  Allergen Reactions   Immune Globulins Other (See Comments)    Tetanus Immunes Globulins    (  Pt. Cannot take the Flu shot ) Guillian Barre    Influenza Vaccines Other (See Comments)    Guillian Barre Guillian Barre   Rho (D) Immune Globulin Other (See Comments)    Tetanus Immunes Globulins(Pt. Cannot take the Flu shot ) Guillian Barre    Aspirin Other (See Comments)    Severe skin bruising  Severe skin bruising    Past Medical History:  Past Medical History:  Diagnosis Date   Atrial fibrillation ([place]) 02/2016   NEW ONSET   Brain tumor (benign) ([place]) 1998   2 small tumors- since 2015 (Dr. [name] following)   Carotid artery occlusion    Cerebrovascular disease September 09, 2001   TIA, Left brain   CKD (chronic kidney disease), stage III ([place])    Diverticulosis    ED (erectile dysfunction)    Guillain-Barre syndrome ([place]) 1999   Hx of elevated lipids    Hyperlipidemia    Hypertension    Memory loss    Stroke [facility]) 2003   Past Surgical History:  Past Surgical History:  Procedure Laterality Date   BRAIN SURGERY  1998   Benign brain tumor removed, Left hemisphere- ? Meningioma   CAROTID ENDARTERECTOMY Left September 13, 2001   LEFT cea   CHOLECYSTECTOMY  Nov. 2001   COLON SURGERY  Feb. 2003   Colonic polyps removed endoscopically   COLONOSCOPY WITH PROPOFOL N/A 11/11/2015   Procedure: COLONOSCOPY WITH PROPOFOL;  Surgeon: Garlan Fair, MD;  Location: [facility] END[facility];  Service: Endoscopy;  Laterality: N/A;   INGUINAL HERNIA REPAIR  1970's   Social History:  Social History   Socioeconomic History   Marital status: Married    Spouse name: Not on file   Number of children: Not on file   Years of education: Not on file   Highest education level: Not on file  Occupational History   Not on file  Social Needs   Financial  resource strain: Not on file   Food insecurity:    Worry: Not on file    Inability: Not on file   Transportation needs:    Medical: Not on file    Non-medical: Not on file  Tobacco Use   Smoking status: Former Smoker    Types: Cigarettes    Last attempt to quit: 05/17/1996    Years since quitting: 22.4   Smokeless tobacco: Never Used  Substance and Sexual Activity   Alcohol use: Yes    Alcohol/week: 1.0 standard drinks    Types: 1 Cans of beer per week    Comment: every few months   Drug use: No   Sexual activity: Not on file  Lifestyle   Physical activity:    Days per week: Not on file    Minutes per session: Not on file   Stress: Not on file  Relationships   Social connections:    Talks on phone: Not on file    Gets together: Not on file    Attends religious service: Not on file    Active member of club or organization: Not on file    Attends meetings of clubs or organizations: Not on file    Relationship status: Not on file   Intimate partner violence:    Fear of current or ex partner: Not on file    Emotionally abused: Not on file    Physically abused: Not on file    Forced sexual activity: Not on file  Other Topics Concern   Not on file  Social History Narrative   Not on file   Family History:  Family History  Problem Relation Age of Onset   Colon cancer Mother        Metastatic    Stroke Father    Diabetes Father    CVA Father     Review of Systems: Constitutional: Denies fevers, chills or abnormal weight loss Eyes: Denies blurriness of vision Ears, nose, mouth, throat, and face: Denies mucositis or sore throat Respiratory: Denies cough, dyspnea or wheezes Cardiovascular: Denies palpitation, chest discomfort or lower extremity swelling Gastrointestinal:  Denies nausea, constipation, diarrhea GU: Denies dysuria or incontinence Skin: Denies abnormal skin rashes Neurological: Per HPI Musculoskeletal: Denies joint pain, back or neck  discomfort. No decrease in ROM Behavioral/Psych: Denies anxiety, disturbance in thought content, and mood instability  Physical Exam: Vitals:   10/12/18 1108  BP: (!) 123/51  Pulse: 66  Resp: 20  Temp: 98.4 F (36.9 C)  SpO2: 98%   KPS: 90. General: Alert, cooperative, pleasant, in no acute distress Head: Craniotomy scar noted, dry and intact. EENT: No conjunctival injection or scleral icterus. Oral mucosa moist Lungs: Resp effort normal Cardiac: Regular rate and rhythm Abdomen: Soft, non-distended abdomen Skin: No rashes cyanosis or petechiae. Extremities: No clubbing or edema  Neurologic Exam: Mental Status: Awake, alert, attentive to examiner. Oriented to self and environment. Language is fluent with intact comprehension.  Cranial Nerves: Visual acuity is grossly normal. Visual fields are full. Extra-ocular movements intact. No ptosis. Face is symmetric, tongue midline. Motor: Tone and bulk are normal. Power is full in both arms and legs. Reflexes are symmetric, no pathologic reflexes present. Intact finger to nose bilaterally Sensory: Intact to light touch and temperature Gait: Normal and tandem gait is normal.   Labs: I have reviewed the data as listed    Component Value Date/Time   NA 140 05/06/2018 0332   K 4.6 05/06/2018 0332   CL 105 05/06/2018 0332   CO2 26 05/06/2018 0332   GLUCOSE 113 (H) 05/06/2018 0332   BUN 29 (H) 05/06/2018 0332   CREATININE 1.86 (H) 05/06/2018 0332   CALCIUM 8.6 (L) 05/06/2018 0332   PROT 6.7 05/04/2018 1007   ALBUMIN 4.0 05/04/2018 1007   AST 23 05/04/2018 1007   ALT 28 05/04/2018 1007   ALKPHOS 80 05/04/2018 1007   BILITOT 1.6 (H) 05/04/2018 1007   GFRNONAA 34 (L) 05/06/2018 0332   GFRAA 40 (L) 05/06/2018 0332   Lab Results  Component Value Date   WBC 12.4 (H) 05/04/2018   NEUTROABS 9.0 (H) 05/04/2018   HGB 17.7 (H) 05/04/2018   HCT 52.0 05/04/2018   MCV 95.9 05/04/2018   PLT 162 05/04/2018    Imaging: [place] Clinician  Interpretation: I have personally reviewed the CNS images as listed.  My interpretation, in the context of the patient's clinical presentation, is stable disease  Mr Jeri Cos Wo Contrast  Result Date: 10/06/2018 CLINICAL DATA:  Follow-up meningiomas status post surgery and radiation. Creatinine was obtained on site at [facility] at [address] Results: Creatinine 2.3 mg/dL. EXAM: MRI HEAD WITHOUT AND WITH CONTRAST TECHNIQUE: Multiplanar, multiecho pulse sequences of the brain and surrounding structures were obtained without and with intravenous contrast. CONTRAST:  2mL MULTIHANCE GADOBENATE DIMEGLUMINE 529 MG/ML IV SOLN COMPARISON:  05/04/2018 and earlier FINDINGS: Brain: 2.6 x 4.6 x 2.5 cm right frontoparietal parafalcine extra-axial mass invading the superior sagittal sinus has not significantly changed in size from 04/06/2017, and mild adjacent edema is also unchanged. The sinus appears patent more proximally and distally. A 2.3 x 2.2 x 1.4 cm extra-axial mass along the left anterior clinoid process is minimally larger than on 04/06/2017 when it measured 2.1 x 2.0 x 1.4 cm. A 9-10 mm extra-axial mass more laterally in the left middle cranial fossa is unchanged from 04/06/2017. No new enhancing intracranial lesions are identified. No acute infarct, intracranial hemorrhage, midline shift, or extra-axial fluid collection is identified. There is mild cerebral atrophy. Extensive encephalomalacia and gliosis involving the left frontal lobe, left temporal lobe, and left insula are unchanged. Vascular: Chronic right ICA occlusion. Skull and upper cervical spine: Left-sided craniectomy. Sinuses/Orbits: Unremarkable orbits. Paranasal sinuses and mastoid air cells are clear. Other: None. IMPRESSION: 1. Minimal growth of a 2.3 cm left anterior clinoid meningioma since 04/06/2017. 2. Unchanged right parafalcine and left middle cranial fossa meningiomas. 3. No acute intracranial abnormality. Electronically  Signed   By: Logan Bores M.D.   On: 10/06/2018 17:31     Assessment/Plan 1. Meningioma, cerebral [facility])  Mr. Cuevas is clinically and radiographically stable two years after [facility] to recurrent meningioma.  His two untreated meningiomas are also stable and asymptomatic at this time.  We appreciate the opportunity to participate in the care of [facility].  He should return to clinic in 1 year following next MRI brain, for review.  All questions were answered. The patient knows to call the clinic with any problems, questions or concerns. No barriers to learning were detected.  The total time spent in the encounter was 25 minutes and more than 50% was on counseling and review of test results   Ventura Sellers, MD Medical Director of Neuro-Oncology [facility] at [place] 10/12/18 11:01 AM

## 2018-10-12 NOTE — Telephone Encounter (Signed)
Scheduled appt per 5/28 los. ° °A calendar will be mailed out. °

## 2018-10-14 DIAGNOSIS — N183 Chronic kidney disease, stage 3 (moderate): Secondary | ICD-10-CM | POA: Diagnosis not present

## 2018-10-14 DIAGNOSIS — I6350 Cerebral infarction due to unspecified occlusion or stenosis of unspecified cerebral artery: Secondary | ICD-10-CM | POA: Diagnosis not present

## 2018-10-14 DIAGNOSIS — I639 Cerebral infarction, unspecified: Secondary | ICD-10-CM | POA: Diagnosis not present

## 2018-10-14 DIAGNOSIS — N400 Enlarged prostate without lower urinary tract symptoms: Secondary | ICD-10-CM | POA: Diagnosis not present

## 2018-10-14 DIAGNOSIS — M1711 Unilateral primary osteoarthritis, right knee: Secondary | ICD-10-CM | POA: Diagnosis not present

## 2018-10-14 DIAGNOSIS — E782 Mixed hyperlipidemia: Secondary | ICD-10-CM | POA: Diagnosis not present

## 2018-10-14 DIAGNOSIS — I129 Hypertensive chronic kidney disease with stage 1 through stage 4 chronic kidney disease, or unspecified chronic kidney disease: Secondary | ICD-10-CM | POA: Diagnosis not present

## 2018-10-14 DIAGNOSIS — I480 Paroxysmal atrial fibrillation: Secondary | ICD-10-CM | POA: Diagnosis not present

## 2018-10-16 ENCOUNTER — Other Ambulatory Visit: Payer: Self-pay | Admitting: *Deleted

## 2018-10-16 ENCOUNTER — Inpatient Hospital Stay: Payer: PPO | Attending: Internal Medicine

## 2018-10-16 DIAGNOSIS — D320 Benign neoplasm of cerebral meninges: Secondary | ICD-10-CM

## 2018-10-27 DIAGNOSIS — N183 Chronic kidney disease, stage 3 (moderate): Secondary | ICD-10-CM | POA: Diagnosis not present

## 2018-10-27 DIAGNOSIS — E782 Mixed hyperlipidemia: Secondary | ICD-10-CM | POA: Diagnosis not present

## 2018-10-27 DIAGNOSIS — I6350 Cerebral infarction due to unspecified occlusion or stenosis of unspecified cerebral artery: Secondary | ICD-10-CM | POA: Diagnosis not present

## 2018-10-27 DIAGNOSIS — N400 Enlarged prostate without lower urinary tract symptoms: Secondary | ICD-10-CM | POA: Diagnosis not present

## 2018-10-27 DIAGNOSIS — I480 Paroxysmal atrial fibrillation: Secondary | ICD-10-CM | POA: Diagnosis not present

## 2018-10-27 DIAGNOSIS — I639 Cerebral infarction, unspecified: Secondary | ICD-10-CM | POA: Diagnosis not present

## 2018-10-27 DIAGNOSIS — M1711 Unilateral primary osteoarthritis, right knee: Secondary | ICD-10-CM | POA: Diagnosis not present

## 2018-10-27 DIAGNOSIS — I129 Hypertensive chronic kidney disease with stage 1 through stage 4 chronic kidney disease, or unspecified chronic kidney disease: Secondary | ICD-10-CM | POA: Diagnosis not present

## 2018-12-22 ENCOUNTER — Emergency Department (HOSPITAL_COMMUNITY)
Admission: EM | Admit: 2018-12-22 | Discharge: 2018-12-22 | Disposition: A | Discharge status: Home / Self Care | Payer: PPO | Attending: Emergency Medicine | Admitting: Emergency Medicine

## 2018-12-22 ENCOUNTER — Encounter (HOSPITAL_COMMUNITY): Payer: Self-pay

## 2018-12-22 ENCOUNTER — Other Ambulatory Visit: Payer: Self-pay

## 2018-12-22 DIAGNOSIS — I480 Paroxysmal atrial fibrillation: Secondary | ICD-10-CM | POA: Diagnosis not present

## 2018-12-22 DIAGNOSIS — Z7901 Long term (current) use of anticoagulants: Secondary | ICD-10-CM | POA: Diagnosis not present

## 2018-12-22 DIAGNOSIS — I129 Hypertensive chronic kidney disease with stage 1 through stage 4 chronic kidney disease, or unspecified chronic kidney disease: Secondary | ICD-10-CM | POA: Insufficient documentation

## 2018-12-22 DIAGNOSIS — Y999 Unspecified external cause status: Secondary | ICD-10-CM | POA: Diagnosis not present

## 2018-12-22 DIAGNOSIS — W208XXA Other cause of strike by thrown, projected or falling object, initial encounter: Secondary | ICD-10-CM | POA: Diagnosis not present

## 2018-12-22 DIAGNOSIS — S51812A Laceration without foreign body of left forearm, initial encounter: Secondary | ICD-10-CM | POA: Diagnosis not present

## 2018-12-22 DIAGNOSIS — Y93H9 Activity, other involving exterior property and land maintenance, building and construction: Secondary | ICD-10-CM | POA: Insufficient documentation

## 2018-12-22 DIAGNOSIS — Y9289 Other specified places as the place of occurrence of the external cause: Secondary | ICD-10-CM | POA: Diagnosis not present

## 2018-12-22 DIAGNOSIS — N183 Chronic kidney disease, stage 3 (moderate): Secondary | ICD-10-CM | POA: Diagnosis not present

## 2018-12-22 DIAGNOSIS — Z79899 Other long term (current) drug therapy: Secondary | ICD-10-CM | POA: Diagnosis not present

## 2018-12-22 DIAGNOSIS — Z87891 Personal history of nicotine dependence: Secondary | ICD-10-CM | POA: Insufficient documentation

## 2018-12-22 DIAGNOSIS — G610 Guillain-Barre syndrome: Secondary | ICD-10-CM | POA: Diagnosis not present

## 2018-12-22 NOTE — ED Provider Notes (Signed)
[facility] DEPT Provider Note   CSN: [id number] Arrival date & time: 12/22/18  1516    History   Chief Complaint Chief Complaint  Patient presents with  . Laceration    HPI [name] is a 78 y.o. male with past medical history of paroxysmal A. fib on Eliquis, CVA, guillian Barre syndrome, CKD, hypertension, presenting to the emergency department with a wound to his left forearm that occurred prior to arrival.  Patient states he was cutting down tree limbs when a tree limb that was about 6 inches in diameter fell down him caused a large skin tear to his left forearm.  He denies any significant pain.  He states his tetanus is not up-to-date because he has had Guillian Barr syndrome twice and was told not to receive this vaccine.  Denies history of immunocompromise.  No other injuries.  Denies numbness or tingling to hand.     The history is provided by the patient.    Past Medical History:  Diagnosis Date  . Atrial fibrillation ([place]) 02/2016   NEW ONSET  . Brain tumor (benign) ([place]) 1998   2 small tumors- since 2015 (Dr. [name] following)  . Carotid artery occlusion   . Cerebrovascular disease September 09, 2001   TIA, Left brain  . CKD (chronic kidney disease), stage III ([place])   . Diverticulosis   . ED (erectile dysfunction)   . Guillain-Barre syndrome ([place]) 1999  . Hx of elevated lipids   . Hyperlipidemia   . Hypertension   . Memory loss   . Stroke [facility]) 2003    Patient Active Problem List   Diagnosis Date Noted  . CVA (cerebral vascular accident) ([place]) 05/05/2018  . Acute CVA (cerebrovascular accident) ([place]) 05/04/2018  . Encounter for therapeutic drug monitoring 01/11/2017  . Meningioma, cerebral ([place]) 12/18/2016  . GBS (Guillain Barre syndrome) ([place]) 04/14/2016  . Neuropathic pain   . Leukocytosis   . Chronic anticoagulation   . Benign essential HTN   . Stage 3 chronic kidney disease ([place])   . PAF (paroxysmal atrial  fibrillation) ([place])   . History of meningioma   . Urinary retention   . History of Guillain-Barre syndrome   . Weakness of both lower extremities   . Leg weakness 04/04/2016  . Guillain Barr syndrome ([place]) 04/04/2016  . Cellulitis of left knee 03/12/2016  . Atrial fibrillation, new onset ([place]) 03/07/2016  . Urinary tract infection without hematuria 03/07/2016  . Renal insufficiency 03/07/2016  . Hypokalemia 03/07/2016  . History of CVA (cerebrovascular accident) 03/07/2016  . HTN (hypertension) 03/07/2016  . Atrial fibrillation ([place]) 03/07/2016  . Acute urinary retention   . Carotid occlusion, right 07/11/2012  . Carotid stenosis 07/11/2012    Past Surgical History:  Procedure Laterality Date  . BRAIN SURGERY  1998   Benign brain tumor removed, Left hemisphere- ? Meningioma  . CAROTID ENDARTERECTOMY Left September 13, 2001   LEFT cea  . CHOLECYSTECTOMY  Nov. 2001  . COLON SURGERY  Feb. 2003   Colonic polyps removed endoscopically  . COLONOSCOPY WITH PROPOFOL N/A 11/11/2015   Procedure: COLONOSCOPY WITH PROPOFOL;  Surgeon: Garlan Fair, MD;  Location: [facility] END[facility];  Service: Endoscopy;  Laterality: N/A;  . INGUINAL HERNIA REPAIR  1970's        Home Medications    Prior to Admission medications   Medication Sig Start Date End Date Taking? Authorizing Provider  acetaminophen (TYLENOL) 500 MG tablet Take 1,000 mg by mouth every  8 (eight) hours as needed for moderate pain.    [provider]  apixaban (ELIQUIS) 5 MG TABS tablet Take 1 tablet (5 mg total) by mouth 2 (two) times daily. 05/30/18   Camnitz, Ocie Doyne, MD  aspirin 81 MG chewable tablet Chew 1 tablet (81 mg total) by mouth daily. 05/06/18   Thurnell Lose, MD  cholecalciferol (VITAMIN D) 1000 units tablet Take 1,000 Units by mouth daily.    [provider]  DILT-XR 180 MG 24 hr capsule Take 180 mg by mouth daily after breakfast.  03/27/18   [provider]   lisinopril-hydrochlorothiazide (PRINZIDE,ZESTORETIC) 10-12.5 MG tablet Take 1 tablet by mouth daily.    [provider]  pantoprazole (PROTONIX) 40 MG tablet Take 1 tablet (40 mg total) by mouth daily. 05/06/18   Thurnell Lose, MD  rosuvastatin (CRESTOR) 20 MG tablet Take 20 mg by mouth every evening.     [provider]  vitamin B-12 (CYANOCOBALAMIN) 1000 MCG tablet Take 1,000 mcg by mouth 2 (two) times a week. Wednesday and Saturday    [provider]    Family History Family History  Problem Relation Age of Onset  . Colon cancer Mother        Metastatic   . Stroke Father   . Diabetes Father   . CVA Father     Social History Social History   Tobacco Use  . Smoking status: Former Smoker    Types: Cigarettes    Quit date: 05/17/1996    Years since quitting: 22.6  . Smokeless tobacco: Never Used  Substance Use Topics  . Alcohol use: Yes    Alcohol/week: 1.0 standard drinks    Types: 1 Cans of beer per week    Comment: every few months  . Drug use: No     Allergies   Immune globulins, Influenza vaccines, Rho (d) immune globulin, and Aspirin   Review of Systems Review of Systems  Skin: Positive for wound.  Hematological: Bruises/bleeds easily.  All other systems reviewed and are negative.    Physical Exam Updated Vital Signs BP 131/72   Pulse (!) 55   Temp 97.6 F (36.4 C) (Oral)   Resp 16   Ht 5\' 10"  (1.778 m)   Wt 99.8 kg   SpO2 100%   BMI 31.57 kg/m   Physical Exam Vitals signs and nursing note reviewed.  Constitutional:      Appearance: He is well-developed.  HENT:     Head: Normocephalic and atraumatic.  Eyes:     Conjunctiva/sclera: Conjunctivae normal.  Cardiovascular:     Rate and Rhythm: Normal rate.     Pulses: Normal pulses.  Pulmonary:     Effort: Pulmonary effort is normal.  Abdominal:     Palpations: Abdomen is soft.  Musculoskeletal:     Comments: Left forearm with large skin tear present.  Not  actively bleeding.  Not grossly contaminated.  There is no large tenderness.  Patient is actively ranging his wrist and elbow without difficulty.  Normal distal sensation.  Skin:    General: Skin is warm.  Neurological:     Mental Status: He is alert.  Psychiatric:        Behavior: Behavior normal.        ED Treatments / Results  Labs (all labs ordered are listed, but only abnormal results are displayed) Labs Reviewed - No data to display  EKG None  Radiology No results found.  Procedures .Marland KitchenLaceration Repair  Date/Time: 12/22/2018 11:16 PM Performed by: Alyssabeth Bruster, [place] N, PA-C Authorized by: Keston Seever, [place] N, PA-C   Consent:    Consent obtained:  Verbal   Consent given by:  Patient   Risks discussed:  Infection and pain   Alternatives discussed:  No treatment Anesthesia (see MAR for exact dosages):    Anesthesia method:  None Laceration details:    Location:  Shoulder/arm   Shoulder/arm location:  L lower arm   Length (cm):  18 Repair type:    Repair type:  Simple Exploration:    Hemostasis achieved with:  Direct pressure   Wound exploration: entire depth of wound probed and visualized     Wound extent: no foreign bodies/material noted     Contaminated: no   Treatment:    Area cleansed with:  Saline   Amount of cleaning:  Extensive   Visualized foreign bodies/material removed: no   Skin repair:    Repair method:  Steri-Strips   Number of Steri-Strips:  5 Approximation:    Approximation:  Loose Post-procedure details:    Dressing:  Non-adherent dressing   Patient tolerance of procedure:  Tolerated well, no immediate complications   (including critical care time)  Medications Ordered in ED Medications - No data to display   Initial Impression / Assessment and Plan / ED Course  I have reviewed the triage vital signs and the nursing notes.  Pertinent labs & imaging results that were available during my care of the patient were reviewed by me and  considered in my medical decision making (see chart for details).        Patient with large skin tear to left forearm that was caused by a falling tree branch.  He denies any significant pain to the arm and declines imaging today.  He also declines tetanus, stating he was instructed by his doctor did not receive this vaccine to do multiple occurrences of Guillian Barr syndrome.  He has neurovascularly intact.  Bleeding is controlled.  Wound was copiously irrigated without evidence of foreign body.  Skin flap approximated with Steri-Strips and nonadhesive dressing was applied.  Discussed wound care and close follow-up with PCP for recheck.  Return sooner for signs of infection.  Discussed results, findings, treatment and follow up. Patient advised of return precautions. Patient verbalized understanding and agreed with plan.   Final Clinical Impressions(s) / ED Diagnoses   Final diagnoses:  Laceration of left forearm, initial encounter    ED Discharge Orders    None       Alexys Lobello, [place] N, PA-C 12/22/18 2318    Milton Ferguson, MD 12/26/18 [phone]

## 2018-12-22 NOTE — Discharge Instructions (Addendum)
Have your primary care to recheck your wound on Monday.  Keep your wound clean and covered. You can take Tylenol as needed for pain.  Elevate your arm as much as possible to help with swelling.  You can apply ice for 20 inside a time. Return emergency department for any signs of infection.

## 2018-12-22 NOTE — ED Notes (Signed)
CLEANED WOUND WITH SALINE APPLIED WET TO DRY BANDAGE

## 2018-12-22 NOTE — ED Triage Notes (Signed)
Brought in by wife.  Patient states he was trimming limbs on a tree and one came down and hit him on his left arm. 6 inches long and 3 inches wide of top layer.     A/Ox4 Ambulatory in triage.     Laceration wrapped up in triage.   Patient states he is on a blood thinner.

## 2018-12-25 DIAGNOSIS — S51802A Unspecified open wound of left forearm, initial encounter: Secondary | ICD-10-CM | POA: Diagnosis not present

## 2018-12-26 DIAGNOSIS — I639 Cerebral infarction, unspecified: Secondary | ICD-10-CM | POA: Diagnosis not present

## 2018-12-26 DIAGNOSIS — I129 Hypertensive chronic kidney disease with stage 1 through stage 4 chronic kidney disease, or unspecified chronic kidney disease: Secondary | ICD-10-CM | POA: Diagnosis not present

## 2018-12-26 DIAGNOSIS — I6350 Cerebral infarction due to unspecified occlusion or stenosis of unspecified cerebral artery: Secondary | ICD-10-CM | POA: Diagnosis not present

## 2018-12-26 DIAGNOSIS — I480 Paroxysmal atrial fibrillation: Secondary | ICD-10-CM | POA: Diagnosis not present

## 2018-12-26 DIAGNOSIS — N400 Enlarged prostate without lower urinary tract symptoms: Secondary | ICD-10-CM | POA: Diagnosis not present

## 2018-12-26 DIAGNOSIS — N183 Chronic kidney disease, stage 3 (moderate): Secondary | ICD-10-CM | POA: Diagnosis not present

## 2018-12-26 DIAGNOSIS — E782 Mixed hyperlipidemia: Secondary | ICD-10-CM | POA: Diagnosis not present

## 2018-12-26 DIAGNOSIS — M1711 Unilateral primary osteoarthritis, right knee: Secondary | ICD-10-CM | POA: Diagnosis not present

## 2018-12-28 DIAGNOSIS — I129 Hypertensive chronic kidney disease with stage 1 through stage 4 chronic kidney disease, or unspecified chronic kidney disease: Secondary | ICD-10-CM | POA: Diagnosis not present

## 2018-12-28 DIAGNOSIS — Z8673 Personal history of transient ischemic attack (TIA), and cerebral infarction without residual deficits: Secondary | ICD-10-CM | POA: Diagnosis not present

## 2018-12-28 DIAGNOSIS — Z87891 Personal history of nicotine dependence: Secondary | ICD-10-CM | POA: Diagnosis not present

## 2018-12-28 DIAGNOSIS — Z7982 Long term (current) use of aspirin: Secondary | ICD-10-CM | POA: Diagnosis not present

## 2018-12-28 DIAGNOSIS — Z9181 History of falling: Secondary | ICD-10-CM | POA: Diagnosis not present

## 2018-12-28 DIAGNOSIS — S51812D Laceration without foreign body of left forearm, subsequent encounter: Secondary | ICD-10-CM | POA: Diagnosis not present

## 2018-12-28 DIAGNOSIS — N189 Chronic kidney disease, unspecified: Secondary | ICD-10-CM | POA: Diagnosis not present

## 2018-12-28 DIAGNOSIS — Z7901 Long term (current) use of anticoagulants: Secondary | ICD-10-CM | POA: Diagnosis not present

## 2019-01-04 DIAGNOSIS — S40812A Abrasion of left upper arm, initial encounter: Secondary | ICD-10-CM | POA: Diagnosis not present

## 2019-01-07 DIAGNOSIS — S51812D Laceration without foreign body of left forearm, subsequent encounter: Secondary | ICD-10-CM | POA: Diagnosis not present

## 2019-01-07 DIAGNOSIS — N189 Chronic kidney disease, unspecified: Secondary | ICD-10-CM | POA: Diagnosis not present

## 2019-01-07 DIAGNOSIS — Z87891 Personal history of nicotine dependence: Secondary | ICD-10-CM | POA: Diagnosis not present

## 2019-01-07 DIAGNOSIS — Z8673 Personal history of transient ischemic attack (TIA), and cerebral infarction without residual deficits: Secondary | ICD-10-CM | POA: Diagnosis not present

## 2019-01-07 DIAGNOSIS — I129 Hypertensive chronic kidney disease with stage 1 through stage 4 chronic kidney disease, or unspecified chronic kidney disease: Secondary | ICD-10-CM | POA: Diagnosis not present

## 2019-01-07 DIAGNOSIS — Z7982 Long term (current) use of aspirin: Secondary | ICD-10-CM | POA: Diagnosis not present

## 2019-01-07 DIAGNOSIS — Z9181 History of falling: Secondary | ICD-10-CM | POA: Diagnosis not present

## 2019-01-07 DIAGNOSIS — Z7901 Long term (current) use of anticoagulants: Secondary | ICD-10-CM | POA: Diagnosis not present

## 2019-01-08 ENCOUNTER — Telehealth: Payer: Self-pay | Admitting: Cardiology

## 2019-01-08 NOTE — Telephone Encounter (Signed)
Patient calling the office for samples of medication: ° ° °1.  What medication and dosage are you requesting samples for?  Eliquis  ° °2.  Are you currently out of this medication? no ° ° °

## 2019-01-08 NOTE — Telephone Encounter (Signed)
Spoke with pt's wife and pt needed Eliquis  5 mg samples Pt is in donut hole left 2 weeks of samples downstairs as well as application to see if would qualify for assistance from pharm com./cy

## 2019-01-16 DIAGNOSIS — Z8673 Personal history of transient ischemic attack (TIA), and cerebral infarction without residual deficits: Secondary | ICD-10-CM | POA: Diagnosis not present

## 2019-01-16 DIAGNOSIS — Z9181 History of falling: Secondary | ICD-10-CM | POA: Diagnosis not present

## 2019-01-16 DIAGNOSIS — I129 Hypertensive chronic kidney disease with stage 1 through stage 4 chronic kidney disease, or unspecified chronic kidney disease: Secondary | ICD-10-CM | POA: Diagnosis not present

## 2019-01-16 DIAGNOSIS — S51812D Laceration without foreign body of left forearm, subsequent encounter: Secondary | ICD-10-CM | POA: Diagnosis not present

## 2019-01-16 DIAGNOSIS — Z87891 Personal history of nicotine dependence: Secondary | ICD-10-CM | POA: Diagnosis not present

## 2019-01-16 DIAGNOSIS — Z7901 Long term (current) use of anticoagulants: Secondary | ICD-10-CM | POA: Diagnosis not present

## 2019-01-16 DIAGNOSIS — N189 Chronic kidney disease, unspecified: Secondary | ICD-10-CM | POA: Diagnosis not present

## 2019-01-16 DIAGNOSIS — Z7982 Long term (current) use of aspirin: Secondary | ICD-10-CM | POA: Diagnosis not present

## 2019-01-24 ENCOUNTER — Encounter (INDEPENDENT_AMBULATORY_CARE_PROVIDER_SITE_OTHER): Payer: Self-pay

## 2019-01-24 ENCOUNTER — Ambulatory Visit: Payer: PPO | Admitting: Cardiology

## 2019-01-24 ENCOUNTER — Encounter: Payer: Self-pay | Admitting: Cardiology

## 2019-01-24 ENCOUNTER — Other Ambulatory Visit: Payer: Self-pay

## 2019-01-24 VITALS — BP 110/42 | HR 69 | Ht 70.0 in | Wt 225.2 lb

## 2019-01-24 DIAGNOSIS — I480 Paroxysmal atrial fibrillation: Secondary | ICD-10-CM | POA: Diagnosis not present

## 2019-01-24 NOTE — Progress Notes (Signed)
Electrophysiology Office Note   Date:  01/24/2019   ID:  Harland German, Alferd Apa [birth year], MRN [id number]  PCP:  Lavone Orn, MD  Primary Electrophysiologist:  Dr Curt Bears    CC: Atrial fibrillation follow-up   History of Present Illness: [name] is a 78 y.o. male who presents today for electrophysiology evaluation.   He was admitted to the hospital at the end of October 2016 with new onset atrial fibrillation, renal failure, left knee cellulitis. He was rate controlled with 120 mg of diltiazem as well as metoprolol and anticoagulated with warfarin.  He was subsequently admitted to hospital 05/06/2018 with an acute posterior left MCA watershed infarct and small acute infarct in the right centrum semi-ovale after presenting with right sided arm weakness.  His Coumadin was stopped and he was started on Eliquis and aspirin.   Today, denies symptoms of palpitations, chest pain, shortness of breath, orthopnea, PND, lower extremity edema, claudication, dizziness, presyncope, syncope, bleeding, or neurologic sequela. The patient is tolerating medications without difficulties.  Overall he is doing well.  He is noted no further episodes of atrial fibrillation.  In August, he was doing yard work and had a tree fall on his arm.  He has a large skin tear on his left arm and that is currently being dressed with home health.  Past Medical History:  Diagnosis Date  . Atrial fibrillation ([place]) 02/2016   NEW ONSET  . Brain tumor (benign) ([place]) 1998   2 small tumors- since 2015 (Dr. [name] following)  . Carotid artery occlusion   . Cerebrovascular disease September 09, 2001   TIA, Left brain  . CKD (chronic kidney disease), stage III ([place])   . Diverticulosis   . ED (erectile dysfunction)   . Guillain-Barre syndrome ([place]) 1999  . Hx of elevated lipids   . Hyperlipidemia   . Hypertension   . Memory loss   . Stroke [facility]) 2003   Past Surgical History:  Procedure Laterality Date  . BRAIN  SURGERY  1998   Benign brain tumor removed, Left hemisphere- ? Meningioma  . CAROTID ENDARTERECTOMY Left September 13, 2001   LEFT cea  . CHOLECYSTECTOMY  Nov. 2001  . COLON SURGERY  Feb. 2003   Colonic polyps removed endoscopically  . COLONOSCOPY WITH PROPOFOL N/A 11/11/2015   Procedure: COLONOSCOPY WITH PROPOFOL;  Surgeon: Garlan Fair, MD;  Location: [facility] END[facility];  Service: Endoscopy;  Laterality: N/A;  . INGUINAL HERNIA REPAIR  1970's     Current Outpatient Medications  Medication Sig Dispense Refill  . acetaminophen (TYLENOL) 500 MG tablet Take 1,000 mg by mouth every 8 (eight) hours as needed for moderate pain.    Marland Kitchen apixaban (ELIQUIS) 5 MG TABS tablet Take 1 tablet (5 mg total) by mouth 2 (two) times daily. 60 tablet 10  . aspirin 81 MG chewable tablet Chew 1 tablet (81 mg total) by mouth daily. 30 tablet 0  . cholecalciferol (VITAMIN D) 1000 units tablet Take 1,000 Units by mouth daily.    Marland Kitchen DILT-XR 180 MG 24 hr capsule Take 180 mg by mouth daily after breakfast.   5  . lisinopril-hydrochlorothiazide (PRINZIDE,ZESTORETIC) 10-12.5 MG tablet Take 0.5 tablets by mouth daily.     . rosuvastatin (CRESTOR) 20 MG tablet Take 20 mg by mouth every evening.     . vitamin B-12 (CYANOCOBALAMIN) 1000 MCG tablet Take 1,000 mcg by mouth 2 (two) times a week. Wednesday and Saturday     No current facility-administered medications for  this visit.     Allergies:   Immune globulins, Influenza vaccines, Rho (d) immune globulin, and Aspirin   Social History:  The patient  reports that he quit smoking about 22 years ago. His smoking use included cigarettes. He has never used smokeless tobacco. He reports current alcohol use of about 1.0 standard drinks of alcohol per week. He reports that he does not use drugs.   Family History:  The patient's family history includes CVA in his father; Colon cancer in his mother; Diabetes in his father; Stroke in his father.   ROS:  Please see the history of present  illness.   Otherwise, review of systems is positive for none.   All other systems are reviewed and negative.   PHYSICAL EXAM: VS:  BP (!) 110/42   Pulse 69   Ht 5\' 10"  (1.778 m)   Wt 225 lb 3.2 oz (102.2 kg)   SpO2 98%   BMI 32.31 kg/m  , BMI Body mass index is 32.31 kg/m. GEN: Well nourished, well developed, in no acute distress  HEENT: normal  Neck: no JVD, carotid bruits, or masses Cardiac: RRR; no murmurs, rubs, or gallops,no edema  Respiratory:  clear to auscultation bilaterally, normal work of breathing GI: soft, nontender, nondistended, + BS MS: no deformity or atrophy  Skin: warm and dry Neuro:  Strength and sensation are intact Psych: euthymic mood, full affect  EKG:  EKG is ordered today. Personal review of the ekg ordered shows sinus rhythm, rate 69   Recent Labs: 05/04/2018: ALT 28; Hemoglobin 17.7; Platelets 162 05/06/2018: BUN 29; Creatinine, Ser 1.86; Potassium 4.6; Sodium 140    Lipid Panel     Component Value Date/Time   CHOL 125 05/05/2018 0552   TRIG 200 (H) 05/05/2018 0552   HDL 27 (L) 05/05/2018 0552   CHOLHDL 4.6 05/05/2018 0552   VLDL 40 05/05/2018 0552   LDLCALC 58 05/05/2018 0552     Wt Readings from Last 3 Encounters:  01/24/19 225 lb 3.2 oz (102.2 kg)  12/22/18 220 lb (99.8 kg)  10/12/18 223 lb 4.8 oz (101.3 kg)      Other studies Reviewed: Additional studies/ records that were reviewed today include: TTE 03/07/16  Review of the above records today demonstrates:  - Left ventricle: The cavity size was normal. Wall thickness was   normal. Systolic function was normal. The estimated ejection   fraction was in the range of 60% to 65%. Wall motion was normal;   there were no regional wall motion abnormalities. - Aortic valve: Valve area (VTI): 1.68 cm^2. Valve area (Vmax):   1.71 cm^2. Valve area (Vmean): 1.68 cm^2. - Left atrium: The atrium was mildly dilated. - Right atrium: The atrium was mildly dilated.  Echo 05/05/18 Left  ventricle: The cavity size was normal. Wall thickness was   increased in a pattern of mild LVH. Systolic function was normal.   The estimated ejection fraction was in the range of 50% to 55%.   Wall motion was normal; there were no regional wall motion   abnormalities. Doppler parameters are consistent with abnormal   left ventricular relaxation (grade 1 diastolic dysfunction). - Mitral valve: Calcified annulus.   ASSESSMENT AND PLAN:  1.  Paroxysmal Atrial fibrillation: Currently on Eliquis.  Not on rate controlling medications.  He remains in sinus rhythm.  No changes.  This patients CHA2DS2-VASc Score and unadjusted Ischemic Stroke Rate (% per year) is equal to 7.2 % stroke rate/year from a score of 5  Above score calculated as 1 point each if present [CHF, HTN, DM, Vascular=MI/PAD/Aortic Plaque, Age if 65-74, or Male] Above score calculated as 2 points each if present [Age > 75, or Stroke/TIA/TE]  2. Hypertension: Currently well controlled  3. CVA: Continue aspirin, statin, Eliquis   Current medicines are reviewed at length with the patient today.   The patient does not have concerns regarding his medicines.  The following changes were made today: None  Labs/ tests ordered today include:  Orders Placed This Encounter  Procedures  . EKG 12-Lead     Disposition:   FU with Camille Dragan 12 months  Signed, Sadiq Mccauley Meredith Leeds, MD  01/24/2019 10:06 AM     [facility] [address] [place] [address] [phone] (office) [phone] (fax)

## 2019-02-02 DIAGNOSIS — N189 Chronic kidney disease, unspecified: Secondary | ICD-10-CM | POA: Diagnosis not present

## 2019-02-02 DIAGNOSIS — Z7901 Long term (current) use of anticoagulants: Secondary | ICD-10-CM | POA: Diagnosis not present

## 2019-02-02 DIAGNOSIS — Z9181 History of falling: Secondary | ICD-10-CM | POA: Diagnosis not present

## 2019-02-02 DIAGNOSIS — S51812D Laceration without foreign body of left forearm, subsequent encounter: Secondary | ICD-10-CM | POA: Diagnosis not present

## 2019-02-02 DIAGNOSIS — Z8673 Personal history of transient ischemic attack (TIA), and cerebral infarction without residual deficits: Secondary | ICD-10-CM | POA: Diagnosis not present

## 2019-02-02 DIAGNOSIS — I129 Hypertensive chronic kidney disease with stage 1 through stage 4 chronic kidney disease, or unspecified chronic kidney disease: Secondary | ICD-10-CM | POA: Diagnosis not present

## 2019-02-02 DIAGNOSIS — Z87891 Personal history of nicotine dependence: Secondary | ICD-10-CM | POA: Diagnosis not present

## 2019-02-02 DIAGNOSIS — Z7982 Long term (current) use of aspirin: Secondary | ICD-10-CM | POA: Diagnosis not present

## 2019-02-06 DIAGNOSIS — M1711 Unilateral primary osteoarthritis, right knee: Secondary | ICD-10-CM | POA: Diagnosis not present

## 2019-03-08 DIAGNOSIS — I480 Paroxysmal atrial fibrillation: Secondary | ICD-10-CM | POA: Diagnosis not present

## 2019-03-08 DIAGNOSIS — N400 Enlarged prostate without lower urinary tract symptoms: Secondary | ICD-10-CM | POA: Diagnosis not present

## 2019-03-08 DIAGNOSIS — E782 Mixed hyperlipidemia: Secondary | ICD-10-CM | POA: Diagnosis not present

## 2019-03-08 DIAGNOSIS — I6350 Cerebral infarction due to unspecified occlusion or stenosis of unspecified cerebral artery: Secondary | ICD-10-CM | POA: Diagnosis not present

## 2019-03-08 DIAGNOSIS — I129 Hypertensive chronic kidney disease with stage 1 through stage 4 chronic kidney disease, or unspecified chronic kidney disease: Secondary | ICD-10-CM | POA: Diagnosis not present

## 2019-03-08 DIAGNOSIS — I639 Cerebral infarction, unspecified: Secondary | ICD-10-CM | POA: Diagnosis not present

## 2019-03-08 DIAGNOSIS — M1711 Unilateral primary osteoarthritis, right knee: Secondary | ICD-10-CM | POA: Diagnosis not present

## 2019-03-15 DIAGNOSIS — H25813 Combined forms of age-related cataract, bilateral: Secondary | ICD-10-CM | POA: Diagnosis not present

## 2019-03-15 DIAGNOSIS — H25811 Combined forms of age-related cataract, right eye: Secondary | ICD-10-CM | POA: Diagnosis not present

## 2019-03-15 DIAGNOSIS — H25812 Combined forms of age-related cataract, left eye: Secondary | ICD-10-CM | POA: Diagnosis not present

## 2019-03-15 DIAGNOSIS — H524 Presbyopia: Secondary | ICD-10-CM | POA: Diagnosis not present

## 2019-03-15 DIAGNOSIS — H5203 Hypermetropia, bilateral: Secondary | ICD-10-CM | POA: Diagnosis not present

## 2019-03-15 DIAGNOSIS — H52203 Unspecified astigmatism, bilateral: Secondary | ICD-10-CM | POA: Diagnosis not present

## 2019-03-19 DIAGNOSIS — Z86010 Personal history of colonic polyps: Secondary | ICD-10-CM | POA: Diagnosis not present

## 2019-03-19 DIAGNOSIS — E782 Mixed hyperlipidemia: Secondary | ICD-10-CM | POA: Diagnosis not present

## 2019-03-19 DIAGNOSIS — B351 Tinea unguium: Secondary | ICD-10-CM | POA: Diagnosis not present

## 2019-03-19 DIAGNOSIS — M1711 Unilateral primary osteoarthritis, right knee: Secondary | ICD-10-CM | POA: Diagnosis not present

## 2019-03-19 DIAGNOSIS — I480 Paroxysmal atrial fibrillation: Secondary | ICD-10-CM | POA: Diagnosis not present

## 2019-03-19 DIAGNOSIS — I129 Hypertensive chronic kidney disease with stage 1 through stage 4 chronic kidney disease, or unspecified chronic kidney disease: Secondary | ICD-10-CM | POA: Diagnosis not present

## 2019-03-19 DIAGNOSIS — R7301 Impaired fasting glucose: Secondary | ICD-10-CM | POA: Diagnosis not present

## 2019-03-19 DIAGNOSIS — N1832 Chronic kidney disease, stage 3b: Secondary | ICD-10-CM | POA: Diagnosis not present

## 2019-03-19 DIAGNOSIS — D6869 Other thrombophilia: Secondary | ICD-10-CM | POA: Diagnosis not present

## 2019-03-19 DIAGNOSIS — Z0000 Encounter for general adult medical examination without abnormal findings: Secondary | ICD-10-CM | POA: Diagnosis not present

## 2019-03-19 DIAGNOSIS — N400 Enlarged prostate without lower urinary tract symptoms: Secondary | ICD-10-CM | POA: Diagnosis not present

## 2019-03-19 DIAGNOSIS — I739 Peripheral vascular disease, unspecified: Secondary | ICD-10-CM | POA: Diagnosis not present

## 2019-03-19 DIAGNOSIS — Z1389 Encounter for screening for other disorder: Secondary | ICD-10-CM | POA: Diagnosis not present

## 2019-03-28 DIAGNOSIS — H2512 Age-related nuclear cataract, left eye: Secondary | ICD-10-CM | POA: Diagnosis not present

## 2019-03-28 DIAGNOSIS — H2511 Age-related nuclear cataract, right eye: Secondary | ICD-10-CM | POA: Diagnosis not present

## 2019-03-28 DIAGNOSIS — H25012 Cortical age-related cataract, left eye: Secondary | ICD-10-CM | POA: Diagnosis not present

## 2019-04-04 DIAGNOSIS — H2511 Age-related nuclear cataract, right eye: Secondary | ICD-10-CM | POA: Diagnosis not present

## 2019-04-04 DIAGNOSIS — H25011 Cortical age-related cataract, right eye: Secondary | ICD-10-CM | POA: Diagnosis not present

## 2019-04-11 ENCOUNTER — Telehealth: Payer: Self-pay

## 2019-04-11 NOTE — Telephone Encounter (Signed)
Received fax from [place] requesting records from Dr. Mickeal Skinner fax placed on [place] desk of  [facility] to handle request.

## 2019-04-18 ENCOUNTER — Telehealth: Payer: Self-pay | Admitting: *Deleted

## 2019-04-18 NOTE — Telephone Encounter (Signed)
Records faxed to [place] - release [id number]

## 2019-05-02 DIAGNOSIS — Z961 Presence of intraocular lens: Secondary | ICD-10-CM | POA: Diagnosis not present

## 2019-05-07 ENCOUNTER — Telehealth: Payer: Self-pay | Admitting: Cardiology

## 2019-05-07 NOTE — Telephone Encounter (Signed)
Patient calling the office for samples of medication:   1.  What medication and dosage are you requesting samples for?  apixaban (ELIQUIS) 5 MG TABS tablet  2.  Are you currently out of this medication? Has 3 tablets left.  Is requesting samples to last him until the first of the year.

## 2019-05-07 NOTE — Telephone Encounter (Signed)
Follow up     Patient following up on sample request

## 2019-05-07 NOTE — Telephone Encounter (Signed)
Called pt to inform his samples will be left with the front desk for him to pick up. Pt was advise yo call me if he has any questions or concerns.

## 2019-06-05 DIAGNOSIS — M1711 Unilateral primary osteoarthritis, right knee: Secondary | ICD-10-CM | POA: Diagnosis not present

## 2019-06-11 ENCOUNTER — Ambulatory Visit: Payer: PPO | Admitting: Podiatry

## 2019-06-11 ENCOUNTER — Encounter: Payer: Self-pay | Admitting: Podiatry

## 2019-06-11 ENCOUNTER — Other Ambulatory Visit: Payer: Self-pay

## 2019-06-11 VITALS — BP 96/44

## 2019-06-11 DIAGNOSIS — M19072 Primary osteoarthritis, left ankle and foot: Secondary | ICD-10-CM | POA: Diagnosis not present

## 2019-06-11 DIAGNOSIS — M2041 Other hammer toe(s) (acquired), right foot: Secondary | ICD-10-CM | POA: Diagnosis not present

## 2019-06-11 DIAGNOSIS — M19071 Primary osteoarthritis, right ankle and foot: Secondary | ICD-10-CM | POA: Diagnosis not present

## 2019-06-11 DIAGNOSIS — B351 Tinea unguium: Secondary | ICD-10-CM | POA: Diagnosis not present

## 2019-06-11 DIAGNOSIS — M2042 Other hammer toe(s) (acquired), left foot: Secondary | ICD-10-CM | POA: Diagnosis not present

## 2019-06-11 DIAGNOSIS — M79674 Pain in right toe(s): Secondary | ICD-10-CM

## 2019-06-11 DIAGNOSIS — M79675 Pain in left toe(s): Secondary | ICD-10-CM | POA: Diagnosis not present

## 2019-06-11 NOTE — Patient Instructions (Signed)

## 2019-06-13 DIAGNOSIS — L905 Scar conditions and fibrosis of skin: Secondary | ICD-10-CM | POA: Diagnosis not present

## 2019-06-13 DIAGNOSIS — D225 Melanocytic nevi of trunk: Secondary | ICD-10-CM | POA: Diagnosis not present

## 2019-06-13 DIAGNOSIS — D2272 Melanocytic nevi of left lower limb, including hip: Secondary | ICD-10-CM | POA: Diagnosis not present

## 2019-06-13 DIAGNOSIS — L814 Other melanin hyperpigmentation: Secondary | ICD-10-CM | POA: Diagnosis not present

## 2019-06-13 DIAGNOSIS — L853 Xerosis cutis: Secondary | ICD-10-CM | POA: Diagnosis not present

## 2019-06-13 DIAGNOSIS — Z85828 Personal history of other malignant neoplasm of skin: Secondary | ICD-10-CM | POA: Diagnosis not present

## 2019-06-13 DIAGNOSIS — D1801 Hemangioma of skin and subcutaneous tissue: Secondary | ICD-10-CM | POA: Diagnosis not present

## 2019-06-13 DIAGNOSIS — L570 Actinic keratosis: Secondary | ICD-10-CM | POA: Diagnosis not present

## 2019-06-13 DIAGNOSIS — L821 Other seborrheic keratosis: Secondary | ICD-10-CM | POA: Diagnosis not present

## 2019-06-16 NOTE — Progress Notes (Signed)
Subjective: [name] presents today referred by Lavone Orn, MD for complaint of painful mycotic nails b/l that are difficult to trim. Pain interferes with ambulation. Aggravating factors include wearing enclosed shoe gear. Pain is relieved with periodic professional debridement.   Past Medical History:  Diagnosis Date  . Atrial fibrillation ([place]) 02/2016   NEW ONSET  . Brain tumor (benign) ([place]) 1998   2 small tumors- since 2015 (Dr. [name] following)  . Carotid artery occlusion   . Cerebrovascular disease September 09, 2001   TIA, Left brain  . CKD (chronic kidney disease), stage III   . Diverticulosis   . ED (erectile dysfunction)   . Guillain-Barre syndrome ([place]) 1999  . Hx of elevated lipids   . Hyperlipidemia   . Hypertension   . Memory loss   . Stroke [facility]) 2003     Patient Active Problem List   Diagnosis Date Noted  . CVA (cerebral vascular accident) ([place]) 05/05/2018  . Acute CVA (cerebrovascular accident) ([place]) 05/04/2018  . Encounter for therapeutic drug monitoring 01/11/2017  . Meningioma, cerebral ([place]) 12/18/2016  . GBS (Guillain Barre syndrome) ([place]) 04/14/2016  . Neuropathic pain   . Leukocytosis   . Chronic anticoagulation   . Benign essential HTN   . Stage 3 chronic kidney disease ([place])   . PAF (paroxysmal atrial fibrillation) ([place])   . History of meningioma   . Urinary retention   . History of Guillain-Barre syndrome   . Weakness of both lower extremities   . Leg weakness 04/04/2016  . Guillain Barr syndrome ([place]) 04/04/2016  . Cellulitis of left knee 03/12/2016  . Atrial fibrillation, new onset ([place]) 03/07/2016  . Urinary tract infection without hematuria 03/07/2016  . Renal insufficiency 03/07/2016  . Hypokalemia 03/07/2016  . History of CVA (cerebrovascular accident) 03/07/2016  . HTN (hypertension) 03/07/2016  . Atrial fibrillation ([place]) 03/07/2016  . Acute urinary retention   . Carotid occlusion, right 07/11/2012  . Carotid stenosis  07/11/2012     Past Surgical History:  Procedure Laterality Date  . BRAIN SURGERY  1998   Benign brain tumor removed, Left hemisphere- ? Meningioma  . CAROTID ENDARTERECTOMY Left September 13, 2001   LEFT cea  . CHOLECYSTECTOMY  Nov. 2001  . COLON SURGERY  Feb. 2003   Colonic polyps removed endoscopically  . COLONOSCOPY WITH PROPOFOL N/A 11/11/2015   Procedure: COLONOSCOPY WITH PROPOFOL;  Surgeon: Garlan Fair, MD;  Location: [facility];  Service: Endoscopy;  Laterality: N/A;  . INGUINAL HERNIA REPAIR  1970's     Current Outpatient Medications on File Prior to Visit  Medication Sig Dispense Refill  . acetaminophen (TYLENOL) 500 MG tablet Take 1,000 mg by mouth every 8 (eight) hours as needed for moderate pain.    Marland Kitchen apixaban (ELIQUIS) 5 MG TABS tablet Take 1 tablet (5 mg total) by mouth 2 (two) times daily. 60 tablet 10  . aspirin 81 MG chewable tablet Chew 1 tablet (81 mg total) by mouth daily. 30 tablet 0  . CARTIA XT 180 MG 24 hr capsule Take 180 mg by mouth every morning.    . cholecalciferol (VITAMIN D) 1000 units tablet Take 1,000 Units by mouth daily.    Marland Kitchen DILT-XR 180 MG 24 hr capsule Take 180 mg by mouth daily after breakfast.   5  . lisinopril-hydrochlorothiazide (PRINZIDE,ZESTORETIC) 10-12.5 MG tablet Take 0.5 tablets by mouth daily.     Marland Kitchen ofloxacin (OCUFLOX) 0.3 % ophthalmic solution INSTILL 1 DROP THREE TIMES DAILY IN OPERATIVE  EYE STARTING 2 DAYS PRIOR TO SURGERY AND AFTER SURGERY FOR 3 WEEKS    . pantoprazole (PROTONIX) 40 MG tablet Take 40 mg by mouth daily.    . prednisoLONE acetate (PRED FORTE) 1 % ophthalmic suspension INSTILL 1 DROP THREE TIMES DAILY IN OPERATIVE EYE(S) STARTING 2 DAYS BEFORE SURGERY AND AFTER SURGERY FOR 3 WEEKS    . rosuvastatin (CRESTOR) 20 MG tablet Take 20 mg by mouth every evening.     . sulfamethoxazole-trimethoprim (BACTRIM DS) 800-160 MG tablet     . vitamin B-12 (CYANOCOBALAMIN) 1000 MCG tablet Take 1,000 mcg by mouth 2 (two) times a week.  Wednesday and Saturday     No current facility-administered medications on file prior to visit.     Allergies  Allergen Reactions  . Immune Globulins Other (See Comments)    Tetanus Immunes Globulins    (  Pt. Cannot take the Flu shot ) Guillian Barre   . Influenza Vaccines Other (See Comments)    Guillian Barre [name]  . Rho (D) Immune Globulin Other (See Comments)    Tetanus Immunes Globulins(Pt. Cannot take the Flu shot ) [name]   . Aspirin Other (See Comments)    Severe skin bruising  Severe skin bruising      Social History   Occupational History  . Not on file  Tobacco Use  . Smoking status: Former Smoker    Types: Cigarettes    Quit date: 05/17/1996    Years since quitting: 23.0  . Smokeless tobacco: Never Used  Substance and Sexual Activity  . Alcohol use: Yes    Alcohol/week: 1.0 standard drinks    Types: 1 Cans of beer per week    Comment: every few months  . Drug use: No  . Sexual activity: Not on file     Family History  Problem Relation Age of Onset  . Colon cancer Mother        Metastatic   . Stroke Father   . Diabetes Father   . CVA Father      Immunization History  Administered Date(s) Administered  . Influenza-Unspecified 01/15/2014     Objective: Vitals:   06/11/19 0933  BP: (!) 96/44    Vascular Examination:  Capillary refill time to digits immediate b/l, faintly palpable DP pulses b/l, faintly palpable PT pulses b/l, pedal hair sparse b/l and skin temperature gradient within normal limits b/l  Dermatological Examination: Pedal skin with normal turgor, texture and tone bilaterally, no open wounds bilaterally, no interdigital macerations bilaterally and toenails 1-5 b/l elongated, dystrophic, thickened, crumbly with subungual debris  Musculoskeletal: Normal muscle strength 5/5 to all lower extremity muscle groups bilaterally, no pain crepitus or joint limitation noted with ROM b/l, hammertoes noted to the left,  right, 2nd toe, 3rd toe, 4th toe, 5th toe and DJD dorsomedial midfoot b/l  Neurological: Protective sensation intact 5/5 intact bilaterally with 10g monofilament b/l, vibratory sensation decreased b/l and proprioception intact bilaterally  Assessment: 1. Pain due to onychomycosis of toenails of both feet   2. Acquired hammertoes of both feet   3. Primary osteoarthritis of both feet      Plan: -Toenails 1-5 b/l were debrided in length and girth without iatrogenic bleeding. -Patient to continue soft, supportive shoe gear daily. -Patient to report any pedal injuries to medical professional immediately. -Patient/POA to call should there be question/concern in the interim.  Return in about 3 months (around 09/09/2019), or nail trim.

## 2019-06-21 ENCOUNTER — Other Ambulatory Visit: Payer: Self-pay | Admitting: Cardiology

## 2019-06-21 NOTE — Telephone Encounter (Signed)
Eliquis 5mg  refill request received. Pt is 79yrs old, weight-102.2kg, Crea-1.95 on 03/19/2019 via [facility] at [place] PCP, Diagnosis-Afib, and last seen by Dr. Curt Bears on 01/24/2019. Dose is appropriate based on dosing criteria. Will send in refill to requested pharmacy.

## 2019-07-02 ENCOUNTER — Telehealth (HOSPITAL_COMMUNITY): Payer: Self-pay

## 2019-07-02 ENCOUNTER — Other Ambulatory Visit: Payer: Self-pay

## 2019-07-02 DIAGNOSIS — I6522 Occlusion and stenosis of left carotid artery: Secondary | ICD-10-CM

## 2019-07-02 NOTE — Telephone Encounter (Signed)

## 2019-07-03 ENCOUNTER — Ambulatory Visit (INDEPENDENT_AMBULATORY_CARE_PROVIDER_SITE_OTHER): Payer: PPO | Admitting: Vascular Surgery

## 2019-07-03 ENCOUNTER — Other Ambulatory Visit: Payer: Self-pay

## 2019-07-03 ENCOUNTER — Encounter: Payer: Self-pay | Admitting: Vascular Surgery

## 2019-07-03 ENCOUNTER — Ambulatory Visit (HOSPITAL_COMMUNITY)
Admission: RE | Admit: 2019-07-03 | Discharge: 2019-07-03 | Disposition: A | Discharge status: Home / Self Care | Payer: PPO | Source: Ambulatory Visit | Attending: Vascular Surgery | Admitting: Vascular Surgery

## 2019-07-03 VITALS — BP 124/72 | HR 64 | Temp 97.8°F | Resp 20 | Ht 70.0 in | Wt 228.0 lb

## 2019-07-03 DIAGNOSIS — Z9889 Other specified postprocedural states: Secondary | ICD-10-CM | POA: Diagnosis not present

## 2019-07-03 DIAGNOSIS — I6522 Occlusion and stenosis of left carotid artery: Secondary | ICD-10-CM | POA: Insufficient documentation

## 2019-07-03 NOTE — Progress Notes (Signed)
[facility] [place]  Patient name: [name] MRN: [id number] DOB: 02-14-1941 Sex: male  REASON FOR VISIT: Follow-up known carotid disease  HPI: [name] is a 79 y.o. male here today for follow-up.  He reports no new neurologic deficits since my last visit with him.  He reports that he has had  eye surgery for correction of his vision and not wearing glasses for the first time since he was 79 years old.  Past Medical History:  Diagnosis Date  . Atrial fibrillation ([place]) 02/2016   NEW ONSET  . Brain tumor (benign) ([place]) 1998   2 small tumors- since 2015 (Dr. [name] following)  . Carotid artery occlusion   . Cerebrovascular disease September 09, 2001   TIA, Left brain  . CKD (chronic kidney disease), stage III   . Diverticulosis   . ED (erectile dysfunction)   . Guillain-Barre syndrome ([place]) 1999  . Hx of elevated lipids   . Hyperlipidemia   . Hypertension   . Memory loss   . Stroke [facility]) 2003    Family History  Problem Relation Age of Onset  . Colon cancer Mother        Metastatic   . Stroke Father   . Diabetes Father   . CVA Father     SOCIAL HISTORY: Social History   Tobacco Use  . Smoking status: Former Smoker    Types: Cigarettes    Quit date: 05/17/1996    Years since quitting: 23.1  . Smokeless tobacco: Never Used  Substance Use Topics  . Alcohol use: Yes    Alcohol/week: 1.0 standard drinks    Types: 1 Cans of beer per week    Comment: every few months    Allergies  Allergen Reactions  . Immune Globulins Other (See Comments)    Tetanus Immunes Globulins    (  Pt. Cannot take the Flu shot ) Guillian Barre   . Influenza Vaccines Other (See Comments)    Guillian Barre [name]  . Rho (D) Immune Globulin Other (See Comments)    Tetanus Immunes Globulins(Pt. Cannot take the Flu shot ) [name]   . Aspirin Other (See Comments)    Severe skin bruising  Severe skin  bruising     Current Outpatient Medications  Medication Sig Dispense Refill  . acetaminophen (TYLENOL) 500 MG tablet Take 1,000 mg by mouth every 8 (eight) hours as needed for moderate pain.    Marland Kitchen aspirin 81 MG chewable tablet Chew 1 tablet (81 mg total) by mouth daily. 30 tablet 0  . CARTIA XT 180 MG 24 hr capsule Take 180 mg by mouth every morning.    . cholecalciferol (VITAMIN D) 1000 units tablet Take 1,000 Units by mouth daily.    Marland Kitchen DILT-XR 180 MG 24 hr capsule Take 180 mg by mouth daily after breakfast.   5  . ELIQUIS 5 MG TABS tablet Take 1 tablet by mouth twice daily 60 tablet 9  . lisinopril-hydrochlorothiazide (PRINZIDE,ZESTORETIC) 10-12.5 MG tablet Take 0.5 tablets by mouth daily.     Marland Kitchen ofloxacin (OCUFLOX) 0.3 % ophthalmic solution INSTILL 1 DROP THREE TIMES DAILY IN OPERATIVE EYE STARTING 2 DAYS PRIOR TO SURGERY AND AFTER SURGERY FOR 3 WEEKS    . pantoprazole (PROTONIX) 40 MG tablet Take 40 mg by mouth daily.    . prednisoLONE acetate (PRED FORTE) 1 % ophthalmic suspension INSTILL 1 DROP THREE TIMES DAILY IN OPERATIVE EYE(S) STARTING 2 DAYS BEFORE SURGERY  AND AFTER SURGERY FOR 3 WEEKS    . rosuvastatin (CRESTOR) 20 MG tablet Take 20 mg by mouth every evening.     . vitamin B-12 (CYANOCOBALAMIN) 1000 MCG tablet Take 1,000 mcg by mouth 2 (two) times a week. Wednesday and Saturday    . sulfamethoxazole-trimethoprim (BACTRIM DS) 800-160 MG tablet      No current facility-administered medications for this visit.    REVIEW OF SYSTEMS:  [X]  denotes positive finding, [ ]  denotes negative finding Cardiac  Comments:  Chest pain or chest pressure:    Shortness of breath upon exertion:    Short of breath when lying flat:    Irregular heart rhythm:        Vascular    Pain in calf, thigh, or hip brought on by ambulation:    Pain in feet at night that wakes you up from your sleep:     Blood clot in your veins:    Leg swelling:           PHYSICAL EXAM: Vitals:   07/03/19 0954  07/03/19 0957  BP: 121/68 124/72  Pulse: 64   Resp: 20   Temp: 97.8 F (36.6 C)   SpO2: 97%   Weight: 228 lb (103.4 kg)   Height: 5\' 10"  (1.778 m)     GENERAL: The patient is a well-nourished male, in no acute distress. The vital signs are documented above. CARDIOVASCULAR: Well-healed left carotid incision.  No bruits in his right and left carotid artery.  2+ radial pulses PULMONARY: There is good air exchange  MUSCULOSKELETAL: There are no major deformities or cyanosis. NEUROLOGIC: No focal weakness or paresthesias are detected. SKIN: There are no ulcers or rashes noted. PSYCHIATRIC: The patient has a normal affect.  DATA:  Carotid duplex today reveals known occlusion of his right internal carotid artery.  No change in his 50% narrowing of his left carotid endarterectomy site.  Had undergone left carotid endarterectomy in 2003  MEDICAL ISSUES: Stable overall.  No neurologic deficits.  We will continue his usual activities.  We will see him again in 1 year with continued carotid duplex follow-up.    Rosetta Posner, MD FACS [facility] Tel [phone] Pager [phone]

## 2019-07-04 ENCOUNTER — Other Ambulatory Visit: Payer: Self-pay | Admitting: *Deleted

## 2019-07-04 DIAGNOSIS — Z9889 Other specified postprocedural states: Secondary | ICD-10-CM

## 2019-07-19 ENCOUNTER — Telehealth: Payer: Self-pay | Admitting: Cardiology

## 2019-07-19 NOTE — Telephone Encounter (Signed)
Patient's wife, Curt Bears, states she is requesting to accompany the patient during his appointment scheduled for 07/23/19 at 10:15 AM with Tommye Standard due to the patient having issues with memory. Please advise.

## 2019-07-19 NOTE — Telephone Encounter (Signed)
Spoke with pt's wife,Kathryn and advised she may attend 07/23/2019 appointment with husband d/t his cognitive issues as long as they are both well and masked.  Pt's wife states she has had both of her Covid vaccines.  She verbalizes understanding and agrees with plan.  Note place in appointment notes.

## 2019-07-20 NOTE — Progress Notes (Signed)
Cardiology Office Note Date:  07/23/2019  Patient ID:  [name], [name] 05/23/1940, MRN [id number] PCP:  Lavone Orn, MD  Electrophysiologist:  Dr. Curt Bears     Chief Complaint: planned EP follow up  History of Present Illness: [name] is a 79 y.o. male with history of HTN, HLD, strokes/TIA, Guillain-Barre syndrome, CKD (III), multiple meningiomass/p radiation 2019, benign brain tumor resection in 2015  PVD with known occl R ICA (follows w/Dr. Donnetta Hutching), and AFib.  He comes in today to be seen for Dr. Curt Bears, last seen by him Sept 2020, at that time, no symptoms of AFib, doing well (recovering from an arm wound after a tree branch fell on it)  He comes in accompanied by his wife, given his neurological history remains with some memory deficits.  He has terrible knee pain that markedly limits what he can do physically, he denies any CP, palpitations or cardiac awareness of any kind.  NO SOB or DOE, no symptoms of PND or orthopnea.  No dizziness, near syncope or syncope  He denies any bleeding or signs of bleeding.  He follows with Dr. Ether Griffins for his vascular/carotid disease, as well as Dr. [name] and the cancer center for his meningiomas.  (last saw the oncologist Dr. Mickeal Skinner in may last year , he was on both ASA and Eliquis then)    Afib Hx: Diagnosed Dec 2019 A/c initially with warfarin >> CVA  >>Eliquis AAD hx None to date  Past Medical History:  Diagnosis Date  . Atrial fibrillation ([place]) 02/2016   NEW ONSET  . Brain tumor (benign) ([place]) 1998   2 small tumors- since 2015 (Dr. [name] following)  . Carotid artery occlusion   . Cerebrovascular disease September 09, 2001   TIA, Left brain  . CKD (chronic kidney disease), stage III   . Diverticulosis   . ED (erectile dysfunction)   . Guillain-Barre syndrome ([place]) 1999  . Hx of elevated lipids   . Hyperlipidemia   . Hypertension   . Memory loss   . Stroke [facility]) 2003    Past Surgical History:  Procedure  Laterality Date  . BRAIN SURGERY  1998   Benign brain tumor removed, Left hemisphere- ? Meningioma  . CAROTID ENDARTERECTOMY Left September 13, 2001   LEFT cea  . CHOLECYSTECTOMY  Nov. 2001  . COLON SURGERY  Feb. 2003   Colonic polyps removed endoscopically  . COLONOSCOPY WITH PROPOFOL N/A 11/11/2015   Procedure: COLONOSCOPY WITH PROPOFOL;  Surgeon: Garlan Fair, MD;  Location: [facility] END[facility]COPY;  Service: Endoscopy;  Laterality: N/A;  . INGUINAL HERNIA REPAIR  1970's    Current Outpatient Medications  Medication Sig Dispense Refill  . acetaminophen (TYLENOL) 500 MG tablet Take 1,000 mg by mouth every 8 (eight) hours as needed for moderate pain.    Marland Kitchen aspirin 81 MG chewable tablet Chew 1 tablet (81 mg total) by mouth daily. 30 tablet 0  . cholecalciferol (VITAMIN D) 1000 units tablet Take 1,000 Units by mouth daily.    Marland Kitchen DILT-XR 180 MG 24 hr capsule Take 180 mg by mouth daily after breakfast.   5  . ELIQUIS 5 MG TABS tablet Take 1 tablet by mouth twice daily 60 tablet 9  . lisinopril-hydrochlorothiazide (PRINZIDE,ZESTORETIC) 10-12.5 MG tablet Take 0.5 tablets by mouth daily.     Marland Kitchen ofloxacin (OCUFLOX) 0.3 % ophthalmic solution INSTILL 1 DROP THREE TIMES DAILY IN OPERATIVE EYE STARTING 2 DAYS PRIOR TO SURGERY AND AFTER SURGERY FOR 3 WEEKS    .  pantoprazole (PROTONIX) 40 MG tablet Take 40 mg by mouth daily.    . prednisoLONE acetate (PRED FORTE) 1 % ophthalmic suspension INSTILL 1 DROP THREE TIMES DAILY IN OPERATIVE EYE(S) STARTING 2 DAYS BEFORE SURGERY AND AFTER SURGERY FOR 3 WEEKS    . rosuvastatin (CRESTOR) 20 MG tablet Take 20 mg by mouth every evening.     . sulfamethoxazole-trimethoprim (BACTRIM DS) 800-160 MG tablet     . vitamin B-12 (CYANOCOBALAMIN) 1000 MCG tablet Take 1,000 mcg by mouth 2 (two) times a week. Wednesday and Saturday     No current facility-administered medications for this visit.    Allergies:   Immune globulins, Influenza vaccines, Rho (d) immune globulin, and Aspirin     Social History:  The patient  reports that he quit smoking about 23 years ago. His smoking use included cigarettes. He has never used smokeless tobacco. He reports current alcohol use of about 1.0 standard drinks of alcohol per week. He reports that he does not use drugs.   Family History:  The patient's family history includes CVA in his father; Colon cancer in his mother; Diabetes in his father; Stroke in his father.  ROS:  Please see the history of present illness.  All other systems are reviewed and otherwise negative.   PHYSICAL EXAM:  VS:  BP (!) 136/42   Pulse 64   Ht 5\' 10"  (1.778 m)   Wt 228 lb (103.4 kg)   SpO2 97%   BMI 32.71 kg/m  BMI: Body mass index is 32.71 kg/m.  Recheck of hi s BP 122/56 Well nourished, well developed, in no acute distress  HEENT: normocephalic, atraumatic  Neck: no JVD, carotid bruits or masses Cardiac:  RRR; no significant murmurs, no rubs, or gallops Lungs:  CTA b/l, no wheezing, rhonchi or rales  Abd: soft, nontender, obese MS: no deformity or atrophy Ext: no edema  Skin: warm and dry, no rash Neuro:  No gross deficits appreciated Psych: euthymic mood, full affect    EKG:  Not done today  05/05/2018 TTE Study Conclusions  - Left ventricle: The cavity size was normal. Wall thickness was  increased in a pattern of mild LVH. Systolic function was normal.  The estimated ejection fraction was in the range of 50% to 55%.  Wall motion was normal; there were no regional wall motion  abnormalities. Doppler parameters are consistent with abnormal  left ventricular relaxation (grade 1 diastolic dysfunction).  - Mitral valve: Calcified annulus.   Impressions:  - Normal LV systolic function; mild LVH; mild diastolic  dysfunction.    Recent Labs: No results found for requested labs within last 8760 hours.  No results found for requested labs within last 8760 hours.   CrCl cannot be calculated (Patient's most recent lab result  is older than the maximum 21 days allowed.).   Wt Readings from Last 3 Encounters:  07/23/19 228 lb (103.4 kg)  07/03/19 228 lb (103.4 kg)  01/24/19 225 lb 3.2 oz (102.2 kg)     Other studies reviewed: Additional studies/records reviewed today include: summarized above  ASSESSMENT AND PLAN:  1. Paroxysmal Afib     CHA2DS2Vasc is 6, on Eliquis, appropriately dosed by age, weight     They report labs done recently by his PMD, we will request them for our records      No burden by lack of symptoms, he is in rhythm today by exam  I have asked that they dicsuss and ensure all of his physicians are  OK with his Eliqus and ASA therapy   2. HTN     Looks good, no changes     Disposition: F/u with [place] Q 60mo, sooner if needed   Current medicines are reviewed at length with the patient today.  The patient did not have any concerns regarding medicines.  Venetia Night, PA-C 07/23/2019 11:01 AM     [facility] [address] [place] [place] [address] [phone] (office)  [phone] (fax)

## 2019-07-23 ENCOUNTER — Ambulatory Visit: Payer: PPO | Admitting: Physician Assistant

## 2019-07-23 ENCOUNTER — Other Ambulatory Visit: Payer: Self-pay

## 2019-07-23 VITALS — BP 136/42 | HR 64 | Ht 70.0 in | Wt 228.0 lb

## 2019-07-23 DIAGNOSIS — I10 Essential (primary) hypertension: Secondary | ICD-10-CM | POA: Diagnosis not present

## 2019-07-23 DIAGNOSIS — I480 Paroxysmal atrial fibrillation: Secondary | ICD-10-CM | POA: Diagnosis not present

## 2019-07-23 NOTE — Patient Instructions (Signed)
Medication Instructions:  Your physician recommends that you continue on your current medications as directed. Please refer to the Current Medication list given to you today.  *If you need a refill on your cardiac medications before your next appointment, please call your pharmacy*   Lab Work: NONE ORDERED  TODAY   If you have labs (blood work) drawn today and your tests are completely normal, you will receive your results only by: . MyChart Message (if you have MyChart) OR . A paper copy in the mail If you have any lab test that is abnormal or we need to change your treatment, we will call you to review the results.   Testing/Procedures: NONE ORDERED  TODAY   Follow-Up: At [facility], you and your health needs are our priority.  As part of our continuing mission to provide you with exceptional heart care, we have created designated Provider Care Teams.  These Care Teams include your primary Cardiologist (physician) and Advanced Practice Providers (APPs -  Physician Assistants and Nurse Practitioners) who all work together to provide you with the care you need, when you need it.  We recommend signing up for the patient portal called "MyChart".  Sign up information is provided on this After Visit Summary.  MyChart is used to connect with patients for Virtual Visits (Telemedicine).  Patients are able to view lab/test results, encounter notes, upcoming appointments, etc.  Non-urgent messages can be sent to your provider as well.   To learn more about what you can do with MyChart, go to [email or web address].    Your next appointment:   6 month(s)  The format for your next appointment:   In Person  Provider:   You may see Will Martin Camnitz, MD or one of the following Advanced Practice Providers on your designated Care Team:    Amber Seiler, NP  Renee Ursuy, PA-C  Michael "Andy" Tillery, PA-C    Other Instructions   

## 2019-07-31 DIAGNOSIS — Z961 Presence of intraocular lens: Secondary | ICD-10-CM | POA: Diagnosis not present

## 2019-09-10 ENCOUNTER — Encounter: Payer: Self-pay | Admitting: Podiatry

## 2019-09-10 ENCOUNTER — Ambulatory Visit: Payer: PPO | Admitting: Podiatry

## 2019-09-10 ENCOUNTER — Other Ambulatory Visit: Payer: Self-pay

## 2019-09-10 VITALS — Temp 96.8°F

## 2019-09-10 DIAGNOSIS — B351 Tinea unguium: Secondary | ICD-10-CM | POA: Diagnosis not present

## 2019-09-10 DIAGNOSIS — M79675 Pain in left toe(s): Secondary | ICD-10-CM | POA: Diagnosis not present

## 2019-09-10 DIAGNOSIS — M79674 Pain in right toe(s): Secondary | ICD-10-CM | POA: Diagnosis not present

## 2019-09-10 NOTE — Patient Instructions (Signed)
Arthritis Arthritis is a term that is commonly used to refer to joint pain or joint disease. There are more than 100 types of arthritis. What are the causes? The most common cause of this condition is wear and tear of a joint. Other causes include:  Gout.  Inflammation of a joint.  An infection of a joint.  Sprains and other injuries near the joint.  A reaction to medicines or drugs, or an allergic reaction. In some cases, the cause may not be known. What are the signs or symptoms? The main symptom of this condition is pain in the joint during movement. Other symptoms include:  Redness, swelling, or stiffness at a joint.  Warmth coming from the joint.  Fever.  Overall feeling of illness. How is this diagnosed? This condition may be diagnosed with a physical exam and tests, including:  Blood tests.  Urine tests.  Imaging tests, such as X-rays, an MRI, or a CT scan. Sometimes, fluid is removed from a joint for testing. How is this treated? This condition may be treated with:  Treatment of the cause, if it is known.  Rest.  Raising (elevating) the joint.  Applying cold or hot packs to the joint.  Medicines to improve symptoms and reduce inflammation.  Injections of a steroid such as cortisone into the joint to help reduce pain and inflammation. Depending on the cause of your arthritis, you may need to make lifestyle changes to reduce stress on your joint. Changes may include:  Exercising more.  Losing weight. Follow these instructions at home: Medicines  Take over-the-counter and prescription medicines only as told by your health care provider.  Do not take aspirin to relieve pain if your health care provider thinks that gout may be causing your pain. Activity  Rest your joint if told by your health care provider. Rest is important when your disease is active and your joint feels painful, swollen, or stiff.  Avoid activities that make the pain worse. It is  important to balance activity with rest.  Exercise your joint regularly with range-of-motion exercises as told by your health care provider. Try doing low-impact exercise, such as: ? Swimming. ? Water aerobics. ? Biking. ? Walking. Managing pain, stiffness, and swelling      If directed, put ice on the joint. ? Put ice in a plastic bag. ? Place a towel between your skin and the bag. ? Leave the ice on for 20 minutes, 2-3 times per day.  If your joint is swollen, raise (elevate) it above the level of your heart if directed by your health care provider.  If your joint feels stiff in the morning, try taking a warm shower.  If directed, apply heat to the affected area as often as told by your health care provider. Use the heat source that your health care provider recommends, such as a moist heat pack or a heating pad. If you have diabetes, do not apply heat without permission from your health care provider. To apply heat: ? Place a towel between your skin and the heat source. ? Leave the heat on for 20-30 minutes. ? Remove the heat if your skin turns bright red. This is especially important if you are unable to feel pain, heat, or cold. You may have a greater risk of getting burned. General instructions  Do not use any products that contain nicotine or tobacco, such as cigarettes, e-cigarettes, and chewing tobacco. If you need help quitting, ask your health care provider.  Keep   all follow-up visits as told by your health care provider. This is important. Contact a health care provider if:  The pain gets worse.  You have a fever. Get help right away if:  You develop severe joint pain, swelling, or redness.  Many joints become painful and swollen.  You develop severe back pain.  You develop severe weakness in your leg.  You cannot control your bladder or bowels. Summary  Arthritis is a term that is commonly used to refer to joint pain or joint disease. There are more than  100 types of arthritis.  The most common cause of this condition is wear and tear of a joint. Other causes include gout, inflammation or infection of the joint, sprains, or allergies.  Symptoms of this condition include redness, swelling, or stiffness of the joint. Other symptoms include warmth, fever, or feeling ill.  This condition is treated with rest, elevation, medicines, and applying cold or hot packs.  Follow your health care provider's instructions about medicines, activity, exercises, and other home care treatments. This information is not intended to replace advice given to you by your health care provider. Make sure you discuss any questions you have with your health care provider. Document Revised: 04/10/2018 Document Reviewed: 04/10/2018 Elsevier Patient Education  [place]. Onychomycosis/Fungal Toenails  WHAT IS IT? An infection that lies within the keratin of your nail plate that is caused by a fungus.  WHY ME? Fungal infections affect all ages, sexes, races, and creeds.  There may be many factors that predispose you to a fungal infection such as age, coexisting medical conditions such as diabetes, or an autoimmune disease; stress, medications, fatigue, genetics, etc.  Bottom line: fungus thrives in a warm, moist environment and your shoes offer such a location.  IS IT CONTAGIOUS? Theoretically, yes.  You do not want to share shoes, nail clippers or files with someone who has fungal toenails.  Walking around barefoot in the same room or sleeping in the same bed is unlikely to transfer the organism.  It is important to realize, however, that fungus can spread easily from one nail to the next on the same foot.  HOW DO WE TREAT THIS?  There are several ways to treat this condition.  Treatment may depend on many factors such as age, medications, pregnancy, liver and kidney conditions, etc.  It is best to ask your doctor which options are available to you.  1. No treatment.    Unlike many other medical concerns, you can live with this condition.  However for many people this can be a painful condition and may lead to ingrown toenails or a bacterial infection.  It is recommended that you keep the nails cut short to help reduce the amount of fungal nail. 2. Topical treatment.  These range from herbal remedies to prescription strength nail lacquers.  About 40-50% effective, topicals require twice daily application for approximately 9 to 12 months or until an entirely new nail has grown out.  The most effective topicals are medical grade medications available through physicians offices. 3. Oral antifungal medications.  With an 80-90% cure rate, the most common oral medication requires 3 to 4 months of therapy and stays in your system for a year as the new nail grows out.  Oral antifungal medications do require blood work to make sure it is a safe drug for you.  A liver function panel will be performed prior to starting the medication and after the first month of treatment.  It is important to have the blood work performed to avoid any harmful side effects.  In general, this medication safe but blood work is required. 4. Laser Therapy.  This treatment is performed by applying a specialized laser to the affected nail plate.  This therapy is noninvasive, fast, and non-painful.  It is not covered by insurance and is therefore, out of pocket.  The results have been very good with a 80-95% cure rate.  [facility] is the only practice in the area to offer this therapy. 5. Permanent Nail Avulsion.  Removing the entire nail so that a new nail will not grow back.

## 2019-09-14 NOTE — Progress Notes (Signed)
Subjective: [name] presents today for follow up of painful mycotic nails b/l that are difficult to trim. Pain interferes with ambulation. Aggravating factors include wearing enclosed shoe gear. Pain is relieved with periodic professional debridement.   He has h/o Guillain Barre' Syndrome. Pt states his toes were a little tender after last visit. He denies any redness, drainage or swelling to digits.  Allergies  Allergen Reactions  . Immune Globulins Other (See Comments)    Tetanus Immunes Globulins    (  Pt. Cannot take the Flu shot ) Guillian Barre   . Influenza Vaccines Other (See Comments)    Guillian Barre [name]  . Rho (D) Immune Globulin Other (See Comments)    Tetanus Immunes Globulins(Pt. Cannot take the Flu shot ) [name]   . Aspirin Other (See Comments)    Severe skin bruising  Severe skin bruising      Objective: Vitals:   09/10/19 0949  Temp: (!) 96.8 F (31 C)    Pt 79 y.o. year old male  in NAD. AAO x 3.   Vascular Examination:  Capillary refill time to digits immediate b/l. Faintly palpable pedal pulses b/l. Pedal hair sparse b/l. Skin temperature gradient within normal limits b/l. No edema noted b/l.  Dermatological Examination: Pedal skin with normal turgor, texture and tone bilaterally. No open wounds bilaterally. No interdigital macerations bilaterally. Toenails 1-5 b/l elongated, dystrophic, thickened, crumbly with subungual debris and tenderness to dorsal palpation.  Musculoskeletal: Normal muscle strength 5/5 to all lower extremity muscle groups bilaterally. No pain crepitus or joint limitation noted with ROM b/l. Hammertoes noted to the 2-5 bilaterally.  Neurological: Protective sensation intact 5/5 intact bilaterally with 10g monofilament b/l. Vibratory sensation intact b/l. Proprioception intact bilaterally.  Assessment: 1. Pain due to onychomycosis of toenails of both feet    Plan: -Toenails 1-5 b/l were debrided  in length and girth with sterile nail nippers and dremel without iatrogenic bleeding.  -Patient to continue soft, supportive shoe gear daily. -Patient to report any pedal injuries to medical professional immediately. -Patient/POA to call should there be question/concern in the interim.  Return in about 3 months (around 12/10/2019) for nail trim/ Eliquis.

## 2019-09-17 ENCOUNTER — Other Ambulatory Visit: Payer: Self-pay | Admitting: Radiation Therapy

## 2019-09-18 DIAGNOSIS — N1832 Chronic kidney disease, stage 3b: Secondary | ICD-10-CM | POA: Diagnosis not present

## 2019-09-18 DIAGNOSIS — M1711 Unilateral primary osteoarthritis, right knee: Secondary | ICD-10-CM | POA: Diagnosis not present

## 2019-09-18 DIAGNOSIS — D509 Iron deficiency anemia, unspecified: Secondary | ICD-10-CM | POA: Diagnosis not present

## 2019-09-18 DIAGNOSIS — I129 Hypertensive chronic kidney disease with stage 1 through stage 4 chronic kidney disease, or unspecified chronic kidney disease: Secondary | ICD-10-CM | POA: Diagnosis not present

## 2019-09-18 DIAGNOSIS — D6869 Other thrombophilia: Secondary | ICD-10-CM | POA: Diagnosis not present

## 2019-09-18 DIAGNOSIS — K219 Gastro-esophageal reflux disease without esophagitis: Secondary | ICD-10-CM | POA: Diagnosis not present

## 2019-09-18 DIAGNOSIS — I480 Paroxysmal atrial fibrillation: Secondary | ICD-10-CM | POA: Diagnosis not present

## 2019-09-18 DIAGNOSIS — R7301 Impaired fasting glucose: Secondary | ICD-10-CM | POA: Diagnosis not present

## 2019-09-18 DIAGNOSIS — D320 Benign neoplasm of cerebral meninges: Secondary | ICD-10-CM | POA: Diagnosis not present

## 2019-10-09 DIAGNOSIS — N1832 Chronic kidney disease, stage 3b: Secondary | ICD-10-CM | POA: Diagnosis not present

## 2019-10-09 DIAGNOSIS — N400 Enlarged prostate without lower urinary tract symptoms: Secondary | ICD-10-CM | POA: Diagnosis not present

## 2019-10-09 DIAGNOSIS — D631 Anemia in chronic kidney disease: Secondary | ICD-10-CM | POA: Diagnosis not present

## 2019-10-09 DIAGNOSIS — N281 Cyst of kidney, acquired: Secondary | ICD-10-CM | POA: Diagnosis not present

## 2019-10-09 DIAGNOSIS — I480 Paroxysmal atrial fibrillation: Secondary | ICD-10-CM | POA: Diagnosis not present

## 2019-10-09 DIAGNOSIS — I129 Hypertensive chronic kidney disease with stage 1 through stage 4 chronic kidney disease, or unspecified chronic kidney disease: Secondary | ICD-10-CM | POA: Diagnosis not present

## 2019-10-09 DIAGNOSIS — N189 Chronic kidney disease, unspecified: Secondary | ICD-10-CM | POA: Diagnosis not present

## 2019-10-11 ENCOUNTER — Other Ambulatory Visit: Payer: Self-pay | Admitting: Nephrology

## 2019-10-11 DIAGNOSIS — N1832 Chronic kidney disease, stage 3b: Secondary | ICD-10-CM

## 2019-10-12 ENCOUNTER — Ambulatory Visit
Admission: RE | Admit: 2019-10-12 | Discharge: 2019-10-12 | Disposition: A | Discharge status: Home / Self Care | Payer: PPO | Source: Ambulatory Visit | Attending: Internal Medicine | Admitting: Internal Medicine

## 2019-10-12 ENCOUNTER — Other Ambulatory Visit: Payer: Self-pay

## 2019-10-12 ENCOUNTER — Inpatient Hospital Stay: Payer: PPO | Attending: Internal Medicine | Admitting: Internal Medicine

## 2019-10-12 ENCOUNTER — Telehealth: Payer: Self-pay | Admitting: Cardiology

## 2019-10-12 VITALS — BP 142/47 | HR 62 | Temp 98.1°F | Resp 18 | Ht 70.0 in | Wt 223.8 lb

## 2019-10-12 DIAGNOSIS — Z8673 Personal history of transient ischemic attack (TIA), and cerebral infarction without residual deficits: Secondary | ICD-10-CM | POA: Diagnosis not present

## 2019-10-12 DIAGNOSIS — M1711 Unilateral primary osteoarthritis, right knee: Secondary | ICD-10-CM | POA: Diagnosis not present

## 2019-10-12 DIAGNOSIS — Z87891 Personal history of nicotine dependence: Secondary | ICD-10-CM | POA: Insufficient documentation

## 2019-10-12 DIAGNOSIS — Z7982 Long term (current) use of aspirin: Secondary | ICD-10-CM | POA: Insufficient documentation

## 2019-10-12 DIAGNOSIS — E782 Mixed hyperlipidemia: Secondary | ICD-10-CM | POA: Diagnosis not present

## 2019-10-12 DIAGNOSIS — M1990 Unspecified osteoarthritis, unspecified site: Secondary | ICD-10-CM | POA: Diagnosis not present

## 2019-10-12 DIAGNOSIS — N1832 Chronic kidney disease, stage 3b: Secondary | ICD-10-CM | POA: Diagnosis not present

## 2019-10-12 DIAGNOSIS — I6350 Cerebral infarction due to unspecified occlusion or stenosis of unspecified cerebral artery: Secondary | ICD-10-CM | POA: Diagnosis not present

## 2019-10-12 DIAGNOSIS — I4891 Unspecified atrial fibrillation: Secondary | ICD-10-CM | POA: Diagnosis not present

## 2019-10-12 DIAGNOSIS — I480 Paroxysmal atrial fibrillation: Secondary | ICD-10-CM | POA: Diagnosis not present

## 2019-10-12 DIAGNOSIS — D509 Iron deficiency anemia, unspecified: Secondary | ICD-10-CM | POA: Diagnosis not present

## 2019-10-12 DIAGNOSIS — I129 Hypertensive chronic kidney disease with stage 1 through stage 4 chronic kidney disease, or unspecified chronic kidney disease: Secondary | ICD-10-CM | POA: Insufficient documentation

## 2019-10-12 DIAGNOSIS — Z7901 Long term (current) use of anticoagulants: Secondary | ICD-10-CM | POA: Insufficient documentation

## 2019-10-12 DIAGNOSIS — D320 Benign neoplasm of cerebral meninges: Secondary | ICD-10-CM

## 2019-10-12 DIAGNOSIS — N183 Chronic kidney disease, stage 3 unspecified: Secondary | ICD-10-CM | POA: Insufficient documentation

## 2019-10-12 DIAGNOSIS — D329 Benign neoplasm of meninges, unspecified: Secondary | ICD-10-CM | POA: Diagnosis not present

## 2019-10-12 DIAGNOSIS — N400 Enlarged prostate without lower urinary tract symptoms: Secondary | ICD-10-CM | POA: Diagnosis not present

## 2019-10-12 DIAGNOSIS — I639 Cerebral infarction, unspecified: Secondary | ICD-10-CM | POA: Diagnosis not present

## 2019-10-12 DIAGNOSIS — Z79899 Other long term (current) drug therapy: Secondary | ICD-10-CM | POA: Diagnosis not present

## 2019-10-12 MED ORDER — GADOBENATE DIMEGLUMINE 529 MG/ML IV SOLN
20.0000 mL | Freq: Once | INTRAVENOUS | Status: AC | PRN
  Administered 2019-10-12: 20 mL via INTRAVENOUS

## 2019-10-12 NOTE — Telephone Encounter (Signed)
Patient's wife is calling to inquire about whether or not the patient may be eligible to have a knee replacement procedure due to his condition. Please advise.

## 2019-10-12 NOTE — Telephone Encounter (Signed)
Attempted to contact wife.  VM picked up but was full. Unable to leave message.

## 2019-10-12 NOTE — Progress Notes (Signed)
[facility] at [place], [place] [address] [phone]   Interval Evaluation  Date of Service: 10/12/19 Patient Name: [name] Patient MRN: [id number] Patient DOB: 11/15/1940 Provider: Ventura Sellers, MD  Identifying Statement:  [name] is a 79 y.o. male with multifocal meningioma    Oncologic History: March 1998: Left frontal meningioma resection [name]) 12/27/17: Completes fractionated SRS to R parasaggital meningioma  Interval History:  [name] presents today for follow up after recent MRI brain.  He and his wife describe no new or progressive neurologic deficits.  No seizures or headaches.  He is active at home during warm weather, riding his tractor.  Is planning on right knee replacement due to advanced osteoarthritis.  No apparent decline in cognition or memory.  Medications: Current Outpatient Medications on File Prior to Visit  Medication Sig Dispense Refill  . acetaminophen (TYLENOL) 500 MG tablet Take 1,000 mg by mouth every 8 (eight) hours as needed for moderate pain.    Marland Kitchen aspirin 81 MG chewable tablet Chew 1 tablet (81 mg total) by mouth daily. 30 tablet 0  . cholecalciferol (VITAMIN D) 1000 units tablet Take 1,000 Units by mouth daily.    Marland Kitchen DILT-XR 180 MG 24 hr capsule Take 180 mg by mouth daily after breakfast.   5  . diltiazem (CARDIZEM CD) 180 MG 24 hr capsule Take 180 mg by mouth every morning.    Marland Kitchen ELIQUIS 5 MG TABS tablet Take 1 tablet by mouth twice daily 60 tablet 9  . lisinopril-hydrochlorothiazide (PRINZIDE,ZESTORETIC) 10-12.5 MG tablet Take 0.5 tablets by mouth daily.     Marland Kitchen ofloxacin (OCUFLOX) 0.3 % ophthalmic solution INSTILL 1 DROP THREE TIMES DAILY IN OPERATIVE EYE STARTING 2 DAYS PRIOR TO SURGERY AND AFTER SURGERY FOR 3 WEEKS    . pantoprazole (PROTONIX) 40 MG tablet Take 40 mg by mouth daily.    . prednisoLONE acetate (PRED FORTE) 1 % ophthalmic suspension INSTILL 1  DROP THREE TIMES DAILY IN OPERATIVE EYE(S) STARTING 2 DAYS BEFORE SURGERY AND AFTER SURGERY FOR 3 WEEKS    . rosuvastatin (CRESTOR) 20 MG tablet Take 20 mg by mouth every evening.     . sulfamethoxazole-trimethoprim (BACTRIM DS) 800-160 MG tablet     . vitamin B-12 (CYANOCOBALAMIN) 1000 MCG tablet Take 1,000 mcg by mouth 2 (two) times a week. Wednesday and Saturday     No current facility-administered medications on file prior to visit.    Allergies:  Allergies  Allergen Reactions  . Immune Globulins Other (See Comments)    Tetanus Immunes Globulins    (  Pt. Cannot take the Flu shot ) Guillian Barre   . Influenza Vaccines Other (See Comments)    Guillian Barre [name]  . Rho (D) Immune Globulin Other (See Comments)    Tetanus Immunes Globulins(Pt. Cannot take the Flu shot ) [name]   . Aspirin Other (See Comments)    Severe skin bruising  Severe skin bruising    Past Medical History:  Past Medical History:  Diagnosis Date  . Atrial fibrillation ([place]) 02/2016   NEW ONSET  . Brain tumor (benign) ([place]) 1998   2 small tumors- since 2015 (Dr. Saintclair Johnson following)  . Carotid artery occlusion   . Cerebrovascular disease September 09, 2001   TIA, Left brain  . CKD (chronic kidney disease), stage III   . Diverticulosis   . ED (erectile dysfunction)   . Guillain-Barre syndrome ([place])  1999  . Hx of elevated lipids   . Hyperlipidemia   . Hypertension   . Memory loss   . Stroke [facility]) 2003   Past Surgical History:  Past Surgical History:  Procedure Laterality Date  . BRAIN SURGERY  1998   Benign brain tumor removed, Left hemisphere- ? Meningioma  . CAROTID ENDARTERECTOMY Left September 13, 2001   LEFT cea  . CHOLECYSTECTOMY  Nov. 2001  . COLON SURGERY  Feb. 2003   Colonic polyps removed endoscopically  . COLONOSCOPY WITH PROPOFOL N/A 11/11/2015   Procedure: COLONOSCOPY WITH PROPOFOL;  Surgeon: Garlan Fair, MD;  Location: [facility];  Service: Endoscopy;   Laterality: N/A;  . INGUINAL HERNIA REPAIR  1970's   Social History:  Social History   Socioeconomic History  . Marital status: Married    Spouse name: Not on file  . Number of children: Not on file  . Years of education: Not on file  . Highest education level: Not on file  Occupational History  . Not on file  Tobacco Use  . Smoking status: Former Smoker    Types: Cigarettes    Quit date: 05/17/1996    Years since quitting: 23.4  . Smokeless tobacco: Never Used  Substance and Sexual Activity  . Alcohol use: Yes    Alcohol/week: 1.0 standard drinks    Types: 1 Cans of beer per week    Comment: every few months  . Drug use: No  . Sexual activity: Not on file  Other Topics Concern  . Not on file  Social History Narrative  . Not on file   Social Determinants of Health   Financial Resource Strain:   . Difficulty of Paying Living Expenses:   Food Insecurity:   . Worried About Charity fundraiser in the Last Year:   . Arboriculturist in the Last Year:   Transportation Needs:   . Film/video editor (Medical):   Marland Kitchen Lack of Transportation (Non-Medical):   Physical Activity:   . Days of Exercise per Week:   . Minutes of Exercise per Session:   Stress:   . Feeling of Stress :   Social Connections:   . Frequency of Communication with Friends and Family:   . Frequency of Social Gatherings with Friends and Family:   . Attends Religious Services:   . Active Member of Clubs or Organizations:   . Attends Archivist Meetings:   Marland Kitchen Marital Status:   Intimate Partner Violence:   . Fear of Current or Ex-Partner:   . Emotionally Abused:   Marland Kitchen Physically Abused:   . Sexually Abused:    Family History:  Family History  Problem Relation Age of Onset  . Colon cancer Mother        Metastatic   . Stroke Father   . Diabetes Father   . CVA Father     Review of Systems: Constitutional: Denies fevers, chills or abnormal weight loss Eyes: Denies blurriness of  vision Ears, nose, mouth, throat, and face: Denies mucositis or sore throat Respiratory: Denies cough, dyspnea or wheezes Cardiovascular: Denies palpitation, chest discomfort or lower extremity swelling Gastrointestinal:  Denies nausea, constipation, diarrhea GU: Denies dysuria or incontinence Skin: Denies abnormal skin rashes Neurological: Per HPI Musculoskeletal: Denies joint pain, back or neck discomfort. No decrease in ROM Behavioral/Psych: Denies anxiety, disturbance in thought content, and mood instability  Physical Exam: Vitals:   10/12/19 1058  BP: (!) 142/47  Pulse: 62  Resp: 18  Temp: 98.1 F (36.7 C)   KPS: 90. General: Alert, cooperative, pleasant, in no acute distress Head: Normal EENT: No conjunctival injection or scleral icterus. Oral mucosa moist Lungs: Resp effort normal Cardiac: Regular rate and rhythm Abdomen: Soft, non-distended abdomen Skin: No rashes cyanosis or petechiae. Extremities: No clubbing or edema  Neurologic Exam: Mental Status: Awake, alert, attentive to examiner. Oriented to self and environment. Language is fluent with intact comprehension.  Cranial Nerves: Visual acuity is grossly normal. Visual fields are full. Extra-ocular movements intact. No ptosis. Face is symmetric, tongue midline. Motor: Tone and bulk are normal. Power is full in both arms and legs.  Sensory: Intact to light touch and temperature Gait: Normal  Labs: I have reviewed the data as listed    Component Value Date/Time   NA 140 05/06/2018 0332   K 4.6 05/06/2018 0332   CL 105 05/06/2018 0332   CO2 26 05/06/2018 0332   GLUCOSE 113 (H) 05/06/2018 0332   BUN 29 (H) 05/06/2018 0332   CREATININE 1.86 (H) 05/06/2018 0332   CALCIUM 8.6 (L) 05/06/2018 0332   PROT 6.7 05/04/2018 1007   ALBUMIN 4.0 05/04/2018 1007   AST 23 05/04/2018 1007   ALT 28 05/04/2018 1007   ALKPHOS 80 05/04/2018 1007   BILITOT 1.6 (H) 05/04/2018 1007   GFRNONAA 34 (L) 05/06/2018 0332   GFRAA 40  (L) 05/06/2018 0332   Lab Results  Component Value Date   WBC 12.4 (H) 05/04/2018   NEUTROABS 9.0 (H) 05/04/2018   HGB 17.7 (H) 05/04/2018   HCT 52.0 05/04/2018   MCV 95.9 05/04/2018   PLT 162 05/04/2018    Imaging: [place] Clinician Interpretation: I have personally reviewed the CNS images as listed.  My interpretation, in the context of the patient's clinical presentation, is progressive disease, left frontal meningioma slightly larger.  Treated R parasaggital mass is stable.  No results found. PENDING OFFICIAL READ 10/12/19   Assessment/Plan 1. Meningioma, cerebral [facility])  Mr. Rallo is clinically stable today.  MRI demonstrates modest interval growth of untreated left frontal meningioma, although formal read is still pending.  The irradiated right parasaggital tumor appears stable.   Because of slow growth, lack of symptoms, we recommended repeating an MRI brain in 6 months.  This will allow him time to recover from knee replacement surgery.  We could consider radiotherapy at that time. His imaging and case in general will be discussed in next brain/spine tumor board meeting.    At this time his meningiomas due not present surgical risk for orthopedic procedure.   We appreciate the opportunity to participate in the care of [facility].   We ask that Harland German return to clinic in 6 months following next brain MRI, or sooner as needed.  All questions were answered. The patient knows to call the clinic with any problems, questions or concerns. No barriers to learning were detected.  The total time spent in the encounter was 40 minutes and more than 50% was on counseling and review of test results   Ventura Sellers, MD Medical Director of Neuro-Oncology [facility] at [place] 10/12/19 10:52 AM

## 2019-10-16 ENCOUNTER — Telehealth: Payer: Self-pay | Admitting: Internal Medicine

## 2019-10-16 NOTE — Telephone Encounter (Signed)
Scheduled appt per 5/28 los.  Left a vm of the appt date and time.

## 2019-10-16 NOTE — Telephone Encounter (Signed)
Advised wife to have surgeon's office fax over clearance request to our office.   She is agreeable to plan.

## 2019-10-17 DIAGNOSIS — D509 Iron deficiency anemia, unspecified: Secondary | ICD-10-CM | POA: Diagnosis not present

## 2019-10-23 ENCOUNTER — Ambulatory Visit
Admission: RE | Admit: 2019-10-23 | Discharge: 2019-10-23 | Disposition: A | Discharge status: Home / Self Care | Payer: PPO | Source: Ambulatory Visit | Attending: Nephrology | Admitting: Nephrology

## 2019-10-23 DIAGNOSIS — M1711 Unilateral primary osteoarthritis, right knee: Secondary | ICD-10-CM | POA: Diagnosis not present

## 2019-10-23 DIAGNOSIS — N183 Chronic kidney disease, stage 3 unspecified: Secondary | ICD-10-CM | POA: Diagnosis not present

## 2019-10-23 DIAGNOSIS — N1832 Chronic kidney disease, stage 3b: Secondary | ICD-10-CM

## 2019-10-24 ENCOUNTER — Other Ambulatory Visit: Payer: Self-pay | Admitting: Orthopedic Surgery

## 2019-10-26 ENCOUNTER — Other Ambulatory Visit (HOSPITAL_COMMUNITY): Payer: Self-pay | Admitting: *Deleted

## 2019-10-29 NOTE — Discharge Instructions (Signed)

## 2019-10-30 ENCOUNTER — Other Ambulatory Visit: Payer: Self-pay

## 2019-10-30 ENCOUNTER — Ambulatory Visit (HOSPITAL_COMMUNITY)
Admission: RE | Admit: 2019-10-30 | Discharge: 2019-10-30 | Disposition: A | Discharge status: Home / Self Care | Payer: PPO | Source: Ambulatory Visit | Attending: Nephrology | Admitting: Nephrology

## 2019-10-30 DIAGNOSIS — D631 Anemia in chronic kidney disease: Secondary | ICD-10-CM | POA: Diagnosis not present

## 2019-10-30 DIAGNOSIS — N1832 Chronic kidney disease, stage 3b: Secondary | ICD-10-CM | POA: Insufficient documentation

## 2019-10-30 MED ORDER — SODIUM CHLORIDE 0.9 % IV SOLN
510.0000 mg | INTRAVENOUS | Status: DC
  Administered 2019-10-30: 510 mg via INTRAVENOUS
  Filled 2019-10-30: qty 17

## 2019-11-01 ENCOUNTER — Telehealth: Payer: Self-pay | Admitting: Cardiology

## 2019-11-01 NOTE — Telephone Encounter (Signed)
New Message     [facility] Pre-operative Risk Assessment    HEARTCARE STAFF: - Please ensure there is not already an duplicate clearance open for this procedure. - Under Visit Info/Reason for Call, type in Other and utilize the format Clearance MM/DD/YY or Clearance TBD. Do not use dashes or single digits. - If request is for dental extraction, please clarify the # of teeth to be extracted.  Request for surgical clearance:  1. What type of surgery is being performed? Right Knee Arthroplasty   2. When is this surgery scheduled? 12/03/19  3. What type of clearance is required (medical clearance vs. Pharmacy clearance to hold med vs. Both)? Both   4. Are there any medications that need to be held prior to surgery and how long? Eliquis; defer to cardiologist to determine hold time  5. Practice name and name of physician performing surgery? [facility]; Dr. Frederik Pear   6. What is the office phone number? [phone]   7.   What is the office fax number? [phone]  8.   Anesthesia type (None, local, MAC, general) ? Spinal   Jenkins Rouge 11/01/2019, 3:23 PM  _________________________________________________________________   (provider comments below)

## 2019-11-01 NOTE — Telephone Encounter (Signed)
Patient with diagnosis of afib on Eliquis for anticoagulation.    Procedure: right TKA Date of procedure: 12/03/19  CHADS2-VASc score of 6 (age x2, HTN, CAD, CVA)  CrCl 69mL/min using adjusted body weight Platelet count 162K in 2019  Typically hold Eliquis for 3 days for TKA due to major surgery/spinal anesthesia used, however pt is at elevated cardiac risk off of anticoagulation due to history of afib and CVA. Will defer to MD for input on anticoagulation hold.

## 2019-11-02 NOTE — Telephone Encounter (Signed)
OK to hold anticoagulation for surgery for least amount of time possible.

## 2019-11-05 NOTE — Telephone Encounter (Signed)
Follow Up:   Wife is returning your call.

## 2019-11-05 NOTE — Telephone Encounter (Signed)
Able to reach the patient. Given past medical history and time since last visit, based on ACC/AHA guidelines, [name] would be at acceptable risk for the planned procedure without further cardiovascular testing.   I will route this recommendation to the requesting party via Epic fax function and remove from pre-op pool.  Please call with questions.  [place], [place] 11/05/2019, 4:26 PM

## 2019-11-05 NOTE — Telephone Encounter (Signed)
Left voice mail to call back 

## 2019-11-06 ENCOUNTER — Ambulatory Visit (HOSPITAL_COMMUNITY)
Admission: RE | Admit: 2019-11-06 | Discharge: 2019-11-06 | Disposition: A | Discharge status: Home / Self Care | Payer: PPO | Source: Ambulatory Visit | Attending: Nephrology | Admitting: Nephrology

## 2019-11-06 ENCOUNTER — Other Ambulatory Visit: Payer: Self-pay

## 2019-11-06 DIAGNOSIS — D631 Anemia in chronic kidney disease: Secondary | ICD-10-CM | POA: Insufficient documentation

## 2019-11-06 DIAGNOSIS — N1832 Chronic kidney disease, stage 3b: Secondary | ICD-10-CM | POA: Insufficient documentation

## 2019-11-06 MED ORDER — SODIUM CHLORIDE 0.9 % IV SOLN
510.0000 mg | INTRAVENOUS | Status: DC
  Administered 2019-11-06: 510 mg via INTRAVENOUS
  Filled 2019-11-06: qty 17

## 2019-11-13 DIAGNOSIS — M1711 Unilateral primary osteoarthritis, right knee: Secondary | ICD-10-CM | POA: Diagnosis not present

## 2019-11-13 DIAGNOSIS — D509 Iron deficiency anemia, unspecified: Secondary | ICD-10-CM | POA: Diagnosis not present

## 2019-11-13 DIAGNOSIS — E782 Mixed hyperlipidemia: Secondary | ICD-10-CM | POA: Diagnosis not present

## 2019-11-13 DIAGNOSIS — I129 Hypertensive chronic kidney disease with stage 1 through stage 4 chronic kidney disease, or unspecified chronic kidney disease: Secondary | ICD-10-CM | POA: Diagnosis not present

## 2019-11-13 DIAGNOSIS — N1832 Chronic kidney disease, stage 3b: Secondary | ICD-10-CM | POA: Diagnosis not present

## 2019-11-13 DIAGNOSIS — I480 Paroxysmal atrial fibrillation: Secondary | ICD-10-CM | POA: Diagnosis not present

## 2019-11-13 DIAGNOSIS — N400 Enlarged prostate without lower urinary tract symptoms: Secondary | ICD-10-CM | POA: Diagnosis not present

## 2019-11-13 DIAGNOSIS — I639 Cerebral infarction, unspecified: Secondary | ICD-10-CM | POA: Diagnosis not present

## 2019-11-13 DIAGNOSIS — I6350 Cerebral infarction due to unspecified occlusion or stenosis of unspecified cerebral artery: Secondary | ICD-10-CM | POA: Diagnosis not present

## 2019-11-21 NOTE — Patient Instructions (Addendum)
DUE TO COVID-19 ONLY ONE VISITOR IS ALLOWED TO COME WITH YOU AND STAY IN THE WAITING ROOM ONLY DURING PRE OP AND PROCEDURE DAY OF SURGERY. THE 2 VISITORS  MAY VISIT WITH YOU AFTER SURGERY IN YOUR PRIVATE ROOM DURING VISITING HOURS ONLY!  YOU NEED TO HAVE A COVID 19 TEST ON_7/15/21______ @_10 :30______, THIS TEST MUST BE DONE BEFORE SURGERY, COME  [place] , [id number].  ([place]) ONCE YOUR COVID TEST IS COMPLETED, PLEASE BEGIN THE QUARANTINE INSTRUCTIONS AS OUTLINED IN YOUR HANDOUT.                [name]    Your procedure is scheduled on: 12/03/19   Report to [facility] Main  Entrance   Report to admitting at   11:15 AM     Call this number if you have problems the morning of surgery [place], NO CHEWING GUM [place].   Do not eat food After Midnight.   YOU MAY HAVE CLEAR LIQUIDS FROM MIDNIGHT UNTIL 10:30 AM.     CLEAR LIQUID DIET   Foods Allowed                                                                     Foods Excluded  Coffee and tea, regular and decaf                             liquids that you cannot  Plain Jell-O any favor except red or purple                                           see through such as: Fruit ices (not with fruit pulp)                                     milk, soups, orange juice  Iced Popsicles                                    All solid food Carbonated beverages, regular and diet                                    Cranberry, grape and apple juices Sports drinks like Gatorade Lightly seasoned clear broth or consume(fat free) Sugar, honey syrup      At 10:30 AM Please finish the prescribed Pre-Surgery  drink.   Nothing by mouth after you finish the  drink !   Take these medicines the morning of surgery with A SIP OF WATER: Diltiazem, Protonix, eye drops                                 You may not have  any  metal on your body including              piercings  Do not wear jewelry, lotions, powders or deodorant                     Men may shave face and neck.   Do not bring valuables to the hospital. [place].  Contacts, dentures or bridgework may not be worn into surgery.       Patients discharged the day of surgery will not be allowed to drive home  . IF YOU ARE HAVING SURGERY AND GOING HOME THE SAME DAY, YOU MUST HAVE AN ADULT TO DRIVE YOU HOME AND BE WITH YOU FOR 24 HOURS.   YOU MAY GO HOME BY TAXI OR UBER OR ORTHERWISE, BUT AN ADULT MUST ACCOMPANY YOU HOME AND STAY WITH YOU FOR 24 HOURS.  Name and phone number of your driver:  Special Instructions: N/A              Please read over the following fact sheets you were given: _____________________________________________________________________             [facility] - Preparing for Surgery Before surgery, you can play an important role.   Because skin is not sterile, your skin needs to be as free of germs as possible.   You can reduce the number of germs on your skin by washing with CHG (chlorahexidine gluconate) soap before surgery.   CHG is an antiseptic cleaner which kills germs and bonds with the skin to continue killing germs even after washing. Please DO NOT use if you have an allergy to CHG or antibacterial soaps.   If your skin becomes reddened/irritated stop using the CHG and inform your nurse when you arrive at Short Stay.   You may shave your face/neck.  Please follow these instructions carefully:  1.  Shower with CHG Soap the night before surgery and the  morning of Surgery.  2.  If you choose to wash your hair, wash your hair first as usual with your  normal  shampoo.  3.  After you shampoo, rinse your hair and body thoroughly to remove the  shampoo.                                        4.  Use CHG as you would any other liquid soap.  You can apply chg directly   to the skin and wash                       Gently with a scrungie or clean washcloth.  5.  Apply the CHG Soap to your body ONLY FROM THE NECK DOWN.   Do not use on face/ open                           Wound or open sores. Avoid contact with eyes, ears mouth and genitals (private parts).                       Wash face,  Genitals (private parts) with your normal soap.             6.  Wash thoroughly, paying  special attention to the area where your surgery  will be performed.  7.  Thoroughly rinse your body with warm water from the neck down.  8.  DO NOT shower/wash with your normal soap after using and rinsing off  the CHG Soap.             9.  Pat yourself dry with a clean towel.            10.  Wear clean pajamas.            11.  Place clean sheets on your bed the night of your first shower and do not  sleep with pets. Day of Surgery : Do not apply any lotions/deodorants the morning of surgery.  Please wear clean clothes to the hospital/surgery center.  FAILURE TO FOLLOW THESE INSTRUCTIONS MAY RESULT IN THE CANCELLATION OF YOUR SURGERY PATIENT SIGNATURE_________________________________  NURSE SIGNATURE__________________________________  ________________________________________________________________________

## 2019-11-22 ENCOUNTER — Encounter (HOSPITAL_COMMUNITY)
Admission: RE | Admit: 2019-11-22 | Discharge: 2019-11-22 | Disposition: A | Discharge status: Home / Self Care | Payer: PPO | Source: Ambulatory Visit | Attending: Orthopedic Surgery | Admitting: Orthopedic Surgery

## 2019-11-22 ENCOUNTER — Encounter (HOSPITAL_COMMUNITY): Payer: Self-pay

## 2019-11-22 ENCOUNTER — Ambulatory Visit (HOSPITAL_COMMUNITY)
Admission: RE | Admit: 2019-11-22 | Discharge: 2019-11-22 | Disposition: A | Discharge status: Home / Self Care | Payer: PPO | Source: Ambulatory Visit | Attending: Orthopedic Surgery | Admitting: Orthopedic Surgery

## 2019-11-22 ENCOUNTER — Other Ambulatory Visit: Payer: Self-pay

## 2019-11-22 DIAGNOSIS — Z01818 Encounter for other preprocedural examination: Secondary | ICD-10-CM | POA: Diagnosis not present

## 2019-11-22 DIAGNOSIS — Z8673 Personal history of transient ischemic attack (TIA), and cerebral infarction without residual deficits: Secondary | ICD-10-CM | POA: Insufficient documentation

## 2019-11-22 DIAGNOSIS — M1711 Unilateral primary osteoarthritis, right knee: Secondary | ICD-10-CM | POA: Diagnosis not present

## 2019-11-22 DIAGNOSIS — N183 Chronic kidney disease, stage 3 unspecified: Secondary | ICD-10-CM | POA: Diagnosis not present

## 2019-11-22 DIAGNOSIS — I4891 Unspecified atrial fibrillation: Secondary | ICD-10-CM | POA: Insufficient documentation

## 2019-11-22 DIAGNOSIS — Z79899 Other long term (current) drug therapy: Secondary | ICD-10-CM | POA: Diagnosis not present

## 2019-11-22 DIAGNOSIS — Z7901 Long term (current) use of anticoagulants: Secondary | ICD-10-CM | POA: Insufficient documentation

## 2019-11-22 DIAGNOSIS — E785 Hyperlipidemia, unspecified: Secondary | ICD-10-CM | POA: Insufficient documentation

## 2019-11-22 DIAGNOSIS — Z7982 Long term (current) use of aspirin: Secondary | ICD-10-CM | POA: Diagnosis not present

## 2019-11-22 DIAGNOSIS — I129 Hypertensive chronic kidney disease with stage 1 through stage 4 chronic kidney disease, or unspecified chronic kidney disease: Secondary | ICD-10-CM | POA: Insufficient documentation

## 2019-11-22 DIAGNOSIS — Z87891 Personal history of nicotine dependence: Secondary | ICD-10-CM | POA: Diagnosis not present

## 2019-11-22 DIAGNOSIS — I10 Essential (primary) hypertension: Secondary | ICD-10-CM | POA: Diagnosis not present

## 2019-11-22 HISTORY — DX: Cardiac arrhythmia, unspecified: I49.9

## 2019-11-22 HISTORY — DX: Unspecified dementia, unspecified severity, without behavioral disturbance, psychotic disturbance, mood disturbance, and anxiety: F03.90

## 2019-11-22 HISTORY — DX: Anemia, unspecified: D64.9

## 2019-11-22 HISTORY — DX: Gastro-esophageal reflux disease without esophagitis: K21.9

## 2019-11-22 LAB — URINALYSIS, ROUTINE W REFLEX MICROSCOPIC
Bilirubin Urine: NEGATIVE
Glucose, UA: NEGATIVE mg/dL
Ketones, ur: NEGATIVE mg/dL
Leukocytes,Ua: NEGATIVE
Nitrite: NEGATIVE
Protein, ur: NEGATIVE mg/dL
Specific Gravity, Urine: 1.015 (ref 1.005–1.030)
pH: 5 (ref 5.0–8.0)

## 2019-11-22 LAB — SURGICAL PCR SCREEN
MRSA, PCR: NEGATIVE
Staphylococcus aureus: NEGATIVE

## 2019-11-22 LAB — TYPE AND SCREEN
ABO/RH(D): O NEG
Antibody Screen: NEGATIVE

## 2019-11-22 LAB — CBC WITH DIFFERENTIAL/PLATELET
Abs Immature Granulocytes: 0.03 10*3/uL (ref 0.00–0.07)
Basophils Absolute: 0.1 10*3/uL (ref 0.0–0.1)
Basophils Relative: 1 %
Eosinophils Absolute: 0.2 10*3/uL (ref 0.0–0.5)
Eosinophils Relative: 2 %
HCT: 44.9 % (ref 39.0–52.0)
Hemoglobin: 13.5 g/dL (ref 13.0–17.0)
Immature Granulocytes: 0 %
Lymphocytes Relative: 15 %
Lymphs Abs: 1.6 10*3/uL (ref 0.7–4.0)
MCH: 27.1 pg (ref 26.0–34.0)
MCHC: 30.1 g/dL (ref 30.0–36.0)
MCV: 90 fL (ref 80.0–100.0)
Monocytes Absolute: 1.1 10*3/uL — ABNORMAL HIGH (ref 0.1–1.0)
Monocytes Relative: 10 %
Neutro Abs: 8.3 10*3/uL — ABNORMAL HIGH (ref 1.7–7.7)
Neutrophils Relative %: 72 %
Platelets: 193 10*3/uL (ref 150–400)
RBC: 4.99 MIL/uL (ref 4.22–5.81)
RDW: 24.4 % — ABNORMAL HIGH (ref 11.5–15.5)
WBC: 11.3 10*3/uL — ABNORMAL HIGH (ref 4.0–10.5)
nRBC: 0 % (ref 0.0–0.2)

## 2019-11-22 LAB — BASIC METABOLIC PANEL
Anion gap: 9 (ref 5–15)
BUN: 22 mg/dL (ref 8–23)
CO2: 27 mmol/L (ref 22–32)
Calcium: 9.2 mg/dL (ref 8.9–10.3)
Chloride: 109 mmol/L (ref 98–111)
Creatinine, Ser: 1.94 mg/dL — ABNORMAL HIGH (ref 0.61–1.24)
GFR calc Af Amer: 37 mL/min — ABNORMAL LOW (ref >60)
GFR calc non Af Amer: 32 mL/min — ABNORMAL LOW (ref >60)
Glucose, Bld: 105 mg/dL — ABNORMAL HIGH (ref 70–99)
Potassium: 4.6 mmol/L (ref 3.5–5.1)
Sodium: 145 mmol/L (ref 135–145)

## 2019-11-22 LAB — PROTIME-INR
INR: 1.2 (ref 0.8–1.2)
Prothrombin Time: 14.5 seconds (ref 11.4–15.2)

## 2019-11-22 LAB — APTT: aPTT: 33 seconds (ref 24–36)

## 2019-11-22 NOTE — Progress Notes (Signed)
   11/22/19 1659  OBSTRUCTIVE SLEEP APNEA  Score 5 or greater  Results sent to PCP  .......06-26-1940  patient's date of birth is  Your patient has screened at an elevated risk for obstructive sleep apnea using the STOP-Bang tool during a presurgical visit. A score of 5 or greater is an elevated risk.

## 2019-11-22 NOTE — Progress Notes (Deleted)
   11/22/19 1659  OBSTRUCTIVE SLEEP APNEA  Score 5 or greater  Results sent to PCP

## 2019-11-22 NOTE — Progress Notes (Signed)
COVID Vaccine Completed:No Date COVID Vaccine completed: COVID vaccine manufacturer: Pfizer    [employer]   PCP - Dr. Lavone Orn Cardiologist - Dr. Elliot Cousin  Chest x-ray - no EKG - 11/22/19 Stress Test - no ECHO - 05/05/18 Cardiac Cath - no  Sleep Study - no but stop bang score was 6. CPAP - no  Fasting Blood Sugar - NA Checks Blood Sugar _____ times a day  Blood Thinner Instructions:[place]/ Dr. Mayer Camel said to hold 3 days prior to DOS Aspirin Instructions:none Last Dose:11/29/19  Anesthesia review:   Patient denies shortness of breath, fever, cough and chest pain at PAT appointment  yes   Patient verbalized understanding of instructions that were given to them at the PAT appointment. Patient was also instructed that they will need to review over the PAT instructions again at home before surgery.  Yes Pt has no SOB with ADLs and is able to climb 1 flight of stairs. He has too much pain in Knee. He has has Ezequiel Ganser twice and has 3 brain tumors. His wife helps him because he is slow to process and has some cognitive changes.

## 2019-11-27 DIAGNOSIS — R8271 Bacteriuria: Secondary | ICD-10-CM | POA: Diagnosis not present

## 2019-11-27 NOTE — Progress Notes (Signed)
Anesthesia Chart Review   Case: [id number] Date/Time: 12/03/19 1334   Procedure: RIGHT TOTAL KNEE ARTHROPLASTY (Right Knee)   Anesthesia type: Spinal   Pre-op diagnosis: RIGHT KNEE OSTEOARTHRITIS   [facility] RO[facility] 07 / [facility]   Surgeons: Frederik Pear, MD      DISCUSSION:79 y.o. former smoker (quit 05/17/96) with h/o HTN, HLD, CKD Stage III (creatinine stable), a-fib (on Eliquis), Stroke, GBS, GERD, right knee OA scheduled for above procedure 12/03/2019 with Dr. Frederik Pear.   Per cardiology preoperative risk assessment 11/05/2019, "Able to reach the patient. Given past medical history and time since last visit, based on ACC/AHA guidelines, Steve Youngberg would be at acceptable risk for the planned procedure without further cardiovascular testing."  Pt advised to hold Eliquis 3 days prior to procedure.   Anticipate pt can proceed with planned procedure barring acute status change.   VS: BP (!) 156/50   Pulse 60   Temp 36.4 C (Oral)   Resp 18   Ht 5\' 8"  (1.727 m)   Wt 105.2 kg   SpO2 96%   BMI 35.28 kg/m   PROVIDERS: Lavone Orn, MD is PCP   Allegra Lai, MD is Cardiologist  LABS: Labs reviewed: Acceptable for surgery. (all labs ordered are listed, but only abnormal results are displayed)  Labs Reviewed  BASIC METABOLIC PANEL - Abnormal; Notable for the following components:      Result Value   Glucose, Bld 105 (*)    Creatinine, Ser 1.94 (*)    GFR calc non Af Amer 32 (*)    GFR calc Af Amer 37 (*)    All other components within normal limits  CBC WITH DIFFERENTIAL/PLATELET - Abnormal; Notable for the following components:   WBC 11.3 (*)    RDW 24.4 (*)    Neutro Abs 8.3 (*)    Monocytes Absolute 1.1 (*)    All other components within normal limits  URINALYSIS, ROUTINE W REFLEX MICROSCOPIC - Abnormal; Notable for the following components:   Hgb urine dipstick SMALL (*)    Bacteria, UA RARE (*)    All other components within normal limits  SURGICAL PCR SCREEN   APTT  PROTIME-INR  TYPE AND SCREEN     IMAGES:   EKG: 11/22/2019 Rate 54 bpm Sinus bradycardia with Premature supraventricular complexes Septal infarct, age undetermined   CV: Echo 05/05/2018 Study Conclusions   - Left ventricle: The cavity size was normal. Wall thickness was  increased in a pattern of mild LVH. Systolic function was normal.  The estimated ejection fraction was in the range of 50% to 55%.  Wall motion was normal; there were no regional wall motion  abnormalities. Doppler parameters are consistent with abnormal  left ventricular relaxation (grade 1 diastolic dysfunction).  - Mitral valve: Calcified annulus.   Impressions:   - Normal LV systolic function; mild LVH; mild diastolic  dysfunction.  Past Medical History:  Diagnosis Date  . Anemia 10/30/19,11/06/19   iron infusions at [facility]  . Atrial fibrillation ([place]) 02/2016   NEW ONSET  . Brain tumor (benign) ([place]) 1998   2 small tumors- since 2015 (Dr. [name] following)  . Carotid artery occlusion   . Cerebrovascular disease September 09, 2001   TIA, Left brain  . CKD (chronic kidney disease), stage III   . Dementia ([place])   . Diverticulosis   . Dysrhythmia    Afib  . ED (erectile dysfunction)   . GERD (gastroesophageal reflux disease)   . Guillain-Barre syndrome (  [place]) [id number]  . Hx of elevated lipids   . Hyperlipidemia   . Hypertension   . Memory loss   . Stroke [facility]) [id number]    Past Surgical History:  Procedure Laterality Date  . BRAIN SURGERY  1998   Benign brain tumor removed, Left hemisphere- ? Meningioma  . CAROTID ENDARTERECTOMY Left September 13, 2001   LEFT cea  . CHOLECYSTECTOMY  Nov. 2001  . COLON SURGERY  Feb. 2003   Colonic polyps removed endoscopically  . COLONOSCOPY WITH PROPOFOL N/A 11/11/2015   Procedure: COLONOSCOPY WITH PROPOFOL;  Surgeon: Garlan Fair, MD;  Location[facility];  Service: Endoscopy;  Laterality: N/A;  . INGUINAL HERNIA REPAIR  1970's     MEDICATIONS: . acetaminophen (TYLENOL) 500 MG tablet  . aspirin 81 MG chewable tablet  . Cholecalciferol (VITAMIN D) 50 MCG (2000 UT) CAPS  . diltiazem (CARDIZEM CD) 180 MG 24 hr capsule  . ELIQUIS 5 MG TABS tablet  . lisinopril-hydrochlorothiazide (PRINZIDE,ZESTORETIC) 10-12.5 MG tablet  . ofloxacin (OCUFLOX) 0.3 % ophthalmic solution  . pantoprazole (PROTONIX) 40 MG tablet  . rosuvastatin (CRESTOR) 20 MG tablet  . vitamin B-12 (CYANOCOBALAMIN) 1000 MCG tablet   No current facility-administered medications for this encounter.   Maia Plan [facility] Pre-Surgical Testing [phone] 11/27/19  2:56 PM

## 2019-11-29 ENCOUNTER — Other Ambulatory Visit (HOSPITAL_COMMUNITY)
Admission: RE | Admit: 2019-11-29 | Discharge: 2019-11-29 | Disposition: A | Discharge status: Home / Self Care | Payer: PPO | Source: Ambulatory Visit | Attending: Orthopedic Surgery | Admitting: Orthopedic Surgery

## 2019-11-29 DIAGNOSIS — Z20822 Contact with and (suspected) exposure to covid-19: Secondary | ICD-10-CM | POA: Diagnosis not present

## 2019-11-29 LAB — SARS CORONAVIRUS 2 (TAT 6-24 HRS): SARS Coronavirus 2: NEGATIVE

## 2019-11-30 DIAGNOSIS — M1711 Unilateral primary osteoarthritis, right knee: Secondary | ICD-10-CM | POA: Diagnosis present

## 2019-11-30 NOTE — H&P (Signed)
TOTAL KNEE ADMISSION H&P  Patient is being admitted for right total knee arthroplasty.  Subjective:  Chief Complaint:right knee pain.  HPI: [name], 79 y.o. male, has a history of pain and functional disability in the right knee due to arthritis and has failed non-surgical conservative treatments for greater than 12 weeks to includeNSAID's and/or analgesics, corticosteriod injections, viscosupplementation injections, flexibility and strengthening excercises, use of assistive devices, weight reduction as appropriate and activity modification.  Onset of symptoms was gradual, starting 4 years ago with gradually worsening course since that time. The patient noted no past surgery on the right knee(s).  Patient currently rates pain in the right knee(s) at 10 out of 10 with activity. Patient has night pain, worsening of pain with activity and weight bearing, pain that interferes with activities of daily living, pain with passive range of motion, crepitus and joint swelling.  Patient has evidence of subchondral sclerosis, periarticular osteophytes and joint space narrowing by imaging studies. There is no active infection.  Patient Active Problem List   Diagnosis Date Noted  . Osteoarthritis of right knee 11/30/2019  . CVA (cerebral vascular accident) ([place]) 05/05/2018  . Acute CVA (cerebrovascular accident) ([place]) 05/04/2018  . Encounter for therapeutic drug monitoring 01/11/2017  . Meningioma, cerebral ([place]) 12/18/2016  . GBS (Guillain Barre syndrome) ([place]) 04/14/2016  . Neuropathic pain   . Leukocytosis   . Chronic anticoagulation   . Benign essential HTN   . Stage 3 chronic kidney disease ([place])   . PAF (paroxysmal atrial fibrillation) ([place])   . History of meningioma   . Urinary retention   . History of Guillain-Barre syndrome   . Weakness of both lower extremities   . Leg weakness 04/04/2016  . Guillain Barr syndrome ([place]) 04/04/2016  . Cellulitis of left knee 03/12/2016  . Atrial  fibrillation, new onset ([place]) 03/07/2016  . Urinary tract infection without hematuria 03/07/2016  . Renal insufficiency 03/07/2016  . Hypokalemia 03/07/2016  . History of CVA (cerebrovascular accident) 03/07/2016  . HTN (hypertension) 03/07/2016  . Atrial fibrillation ([place]) 03/07/2016  . Acute urinary retention   . Carotid occlusion, right 07/11/2012  . Carotid stenosis 07/11/2012   Past Medical History:  Diagnosis Date  . Anemia 10/30/19,11/06/19   iron infusions at [facility]  . Atrial fibrillation ([place]) 02/2016   NEW ONSET  . Brain tumor (benign) ([place]) 1998   2 small tumors- since 2015 (Dr. [name] following)  . Carotid artery occlusion   . Cerebrovascular disease September 09, 2001   TIA, Left brain  . CKD (chronic kidney disease), stage III   . Dementia ([place])   . Diverticulosis   . Dysrhythmia    Afib  . ED (erectile dysfunction)   . GERD (gastroesophageal reflux disease)   . Guillain-Barre syndrome ([place]) [id number]  . Hx of elevated lipids   . Hyperlipidemia   . Hypertension   . Memory loss   . Stroke [facility]) [id number]    Past Surgical History:  Procedure Laterality Date  . BRAIN SURGERY  1998   Benign brain tumor removed, Left hemisphere- ? Meningioma  . CAROTID ENDARTERECTOMY Left September 13, 2001   LEFT cea  . CHOLECYSTECTOMY  Nov. 2001  . COLON SURGERY  Feb. 2003   Colonic polyps removed endoscopically  . COLONOSCOPY WITH PROPOFOL N/A 11/11/2015   Procedure: COLONOSCOPY WITH PROPOFOL;  Surgeon: Garlan Fair, MD;  Location: [facility];  Service: Endoscopy;  Laterality: N/A;  . INGUINAL HERNIA REPAIR  1970's    No  current facility-administered medications for this encounter.   Current Outpatient Medications  Medication Sig Dispense Refill Last Dose  . acetaminophen (TYLENOL) 500 MG tablet Take 1,000 mg by mouth every 8 (eight) hours as needed for moderate pain.     Marland Kitchen aspirin 81 MG chewable tablet Chew 1 tablet (81 mg total) by mouth daily. 30 tablet 0   .  Cholecalciferol (VITAMIN D) 50 MCG (2000 UT) CAPS Take 2,000 Units by mouth daily.      Marland Kitchen diltiazem (CARDIZEM CD) 180 MG 24 hr capsule Take 180 mg by mouth every morning.     Marland Kitchen ELIQUIS 5 MG TABS tablet Take 1 tablet by mouth twice daily (Patient taking differently: Take 5 mg by mouth 2 (two) times daily. ) 60 tablet 9   . lisinopril-hydrochlorothiazide (PRINZIDE,ZESTORETIC) 10-12.5 MG tablet Take 0.5 tablets by mouth daily.      . pantoprazole (PROTONIX) 40 MG tablet Take 40 mg by mouth daily.     . rosuvastatin (CRESTOR) 20 MG tablet Take 20 mg by mouth every evening.      . vitamin B-12 (CYANOCOBALAMIN) 1000 MCG tablet Take 1,000 mcg by mouth 2 (two) times a week. Wednesday and Saturday     . ofloxacin (OCUFLOX) 0.3 % ophthalmic solution INSTILL 1 DROP THREE TIMES DAILY IN OPERATIVE EYE STARTING 2 DAYS PRIOR TO SURGERY AND AFTER SURGERY FOR 3 WEEKS (Patient not taking: Reported on 11/12/2019)   Not Taking at Unknown time   Allergies  Allergen Reactions  . Immune Globulins Other (See Comments)    Tetanus Immunes Globulins    (  Pt. Cannot take the Flu shot ) Guillian Barre   . Influenza Vaccines Other (See Comments)    Guillian Barre [name]  . Rho (D) Immune Globulin Other (See Comments)    Tetanus Immunes Globulins(Pt. Cannot take the Flu shot ) [name]     Social History   Tobacco Use  . Smoking status: Former Smoker    Types: Cigarettes    Quit date: 05/17/1996    Years since quitting: 23.5  . Smokeless tobacco: Never Used  Substance Use Topics  . Alcohol use: Never    Family History  Problem Relation Age of Onset  . Colon cancer Mother        Metastatic   . Stroke Father   . Diabetes Father   . CVA Father      Review of Systems  Constitutional: Negative.   HENT: Negative.   Eyes: Negative.   Respiratory: Negative.   Cardiovascular:       HTN  Gastrointestinal: Negative.   Endocrine: Negative.   Genitourinary: Negative.   Musculoskeletal:  Positive for arthralgias, joint swelling and myalgias.  Allergic/Immunologic: Negative.   Neurological: Negative.   Hematological: Bruises/bleeds easily.  Psychiatric/Behavioral: Negative.     Objective:  Physical Exam Constitutional:      Appearance: Normal appearance. He is normal weight.  HENT:     Head: Normocephalic and atraumatic.     Mouth/Throat:     Mouth: Mucous membranes are moist.  Cardiovascular:     Pulses: Normal pulses.  Pulmonary:     Effort: Pulmonary effort is normal.  Musculoskeletal:        General: Swelling and tenderness present.     Comments: Tender along the medial joint line of the right knee obvious varus deformity of 5 range of motion is 5/120 skin is intact neurovascular intact distally toes are pink and well perfused he does have varicosities  to both lower extremities.  Good quadriceps and hamstring power to the right knee collateral ligaments are stable but varus stress exacerbates his pain.  Skin:    General: Skin is warm and dry.  Neurological:     General: No focal deficit present.     Mental Status: He is alert and oriented to person, place, and time. Mental status is at baseline.  Psychiatric:        Mood and Affect: Mood normal.        Behavior: Behavior normal.        Thought Content: Thought content normal.        Judgment: Judgment normal.     Vital signs in last 24 hours:    Labs:   Estimated body mass index is 35.28 kg/m as calculated from the following:   Height as of 11/22/19: 5\' 8"  (1.727 m).   Weight as of 11/22/19: 105.2 kg.   Imaging Review Plain radiographs demonstrate AP Rosen were lateral and sunrise x-rays of the right knee showing bone-on-bone arthritis lateral subluxation of tibia beneath femur and peripheral osteophytes.  Patient has a 5 varus deformity.        Assessment/Plan:  End stage arthritis, right knee   The patient history, physical examination, clinical judgment of the provider and imaging  studies are consistent with end stage degenerative joint disease of the right knee(s) and total knee arthroplasty is deemed medically necessary. The treatment options including medical management, injection therapy arthroscopy and arthroplasty were discussed at length. The risks and benefits of total knee arthroplasty were presented and reviewed. The risks due to aseptic loosening, infection, stiffness, patella tracking problems, thromboembolic complications and other imponderables were discussed. The patient acknowledged the explanation, agreed to proceed with the plan and consent was signed. Patient is being admitted for inpatient treatment for surgery, pain control, PT, OT, prophylactic antibiotics, VTE prophylaxis, progressive ambulation and ADL's and discharge planning. The patient is planning to be discharged home with home health services    Anticipated LOS equal to or greater than 2 midnights due to - Age 17 and older with one or more of the following:  - Obesity  - Expected need for hospital services (PT, OT, Nursing) required for safe  discharge  - Anticipated need for postoperative skilled nursing care or inpatient rehab  - Active co-morbidities: Coronary Artery Disease and Stroke

## 2019-12-02 MED ORDER — TRANEXAMIC ACID 1000 MG/10ML IV SOLN
2000.0000 mg | INTRAVENOUS | Status: DC
  Filled 2019-12-02: qty 20

## 2019-12-02 MED ORDER — BUPIVACAINE LIPOSOME 1.3 % IJ SUSP
20.0000 mL | Freq: Once | INTRAMUSCULAR | Status: DC
  Filled 2019-12-02: qty 20

## 2019-12-03 ENCOUNTER — Other Ambulatory Visit: Payer: Self-pay

## 2019-12-03 ENCOUNTER — Ambulatory Visit (HOSPITAL_COMMUNITY): Payer: PPO | Admitting: Physician Assistant

## 2019-12-03 ENCOUNTER — Ambulatory Visit (HOSPITAL_COMMUNITY)
Admission: RE | Admit: 2019-12-03 | Discharge: 2019-12-04 | Disposition: A | Discharge status: Home / Self Care | Payer: PPO | Source: Ambulatory Visit | Attending: Orthopedic Surgery | Admitting: Orthopedic Surgery

## 2019-12-03 ENCOUNTER — Encounter (HOSPITAL_COMMUNITY): Payer: Self-pay | Admitting: Orthopedic Surgery

## 2019-12-03 ENCOUNTER — Encounter (HOSPITAL_COMMUNITY)
Admission: RE | Disposition: A | Discharge status: Home / Self Care | Payer: Self-pay | Source: Ambulatory Visit | Attending: Orthopedic Surgery

## 2019-12-03 ENCOUNTER — Ambulatory Visit (HOSPITAL_COMMUNITY): Payer: PPO | Admitting: Certified Registered"

## 2019-12-03 DIAGNOSIS — F0390 Unspecified dementia without behavioral disturbance: Secondary | ICD-10-CM | POA: Diagnosis not present

## 2019-12-03 DIAGNOSIS — Z7982 Long term (current) use of aspirin: Secondary | ICD-10-CM | POA: Insufficient documentation

## 2019-12-03 DIAGNOSIS — Z87891 Personal history of nicotine dependence: Secondary | ICD-10-CM | POA: Insufficient documentation

## 2019-12-03 DIAGNOSIS — E785 Hyperlipidemia, unspecified: Secondary | ICD-10-CM | POA: Diagnosis not present

## 2019-12-03 DIAGNOSIS — I480 Paroxysmal atrial fibrillation: Secondary | ICD-10-CM | POA: Insufficient documentation

## 2019-12-03 DIAGNOSIS — Z7901 Long term (current) use of anticoagulants: Secondary | ICD-10-CM | POA: Insufficient documentation

## 2019-12-03 DIAGNOSIS — M1990 Unspecified osteoarthritis, unspecified site: Secondary | ICD-10-CM | POA: Diagnosis not present

## 2019-12-03 DIAGNOSIS — I10 Essential (primary) hypertension: Secondary | ICD-10-CM | POA: Diagnosis not present

## 2019-12-03 DIAGNOSIS — K219 Gastro-esophageal reflux disease without esophagitis: Secondary | ICD-10-CM | POA: Diagnosis not present

## 2019-12-03 DIAGNOSIS — G610 Guillain-Barre syndrome: Secondary | ICD-10-CM | POA: Insufficient documentation

## 2019-12-03 DIAGNOSIS — N529 Male erectile dysfunction, unspecified: Secondary | ICD-10-CM | POA: Diagnosis not present

## 2019-12-03 DIAGNOSIS — N183 Chronic kidney disease, stage 3 unspecified: Secondary | ICD-10-CM | POA: Diagnosis not present

## 2019-12-03 DIAGNOSIS — Z8673 Personal history of transient ischemic attack (TIA), and cerebral infarction without residual deficits: Secondary | ICD-10-CM | POA: Diagnosis not present

## 2019-12-03 DIAGNOSIS — M1711 Unilateral primary osteoarthritis, right knee: Secondary | ICD-10-CM | POA: Diagnosis not present

## 2019-12-03 DIAGNOSIS — Z79899 Other long term (current) drug therapy: Secondary | ICD-10-CM | POA: Diagnosis not present

## 2019-12-03 DIAGNOSIS — I130 Hypertensive heart and chronic kidney disease with heart failure and stage 1 through stage 4 chronic kidney disease, or unspecified chronic kidney disease: Secondary | ICD-10-CM | POA: Insufficient documentation

## 2019-12-03 DIAGNOSIS — Z6835 Body mass index (BMI) 35.0-35.9, adult: Secondary | ICD-10-CM | POA: Diagnosis not present

## 2019-12-03 DIAGNOSIS — G8918 Other acute postprocedural pain: Secondary | ICD-10-CM | POA: Diagnosis not present

## 2019-12-03 HISTORY — PX: TOTAL KNEE ARTHROPLASTY: SHX125

## 2019-12-03 LAB — ABO/RH: ABO/RH(D): O NEG

## 2019-12-03 SURGERY — ARTHROPLASTY, KNEE, TOTAL
Anesthesia: General | Site: Knee | Laterality: Right

## 2019-12-03 MED ORDER — LACTATED RINGERS IV SOLN: INTRAVENOUS | Status: DC

## 2019-12-03 MED ORDER — ONDANSETRON HCL 4 MG/2ML IJ SOLN: 4.0000 mg | Freq: Four times a day (QID) | INTRAMUSCULAR | Status: DC | PRN

## 2019-12-03 MED ORDER — OXYCODONE HCL 5 MG PO TABS
5.0000 mg | ORAL_TABLET | ORAL | Status: DC | PRN
  Administered 2019-12-03 – 2019-12-04 (×4): 5 mg via ORAL
  Filled 2019-12-03 (×4): qty 1

## 2019-12-03 MED ORDER — LIDOCAINE 2% (20 MG/ML) 5 ML SYRINGE
INTRAMUSCULAR | Status: DC | PRN
  Administered 2019-12-03: 60 mg via INTRAVENOUS

## 2019-12-03 MED ORDER — DEXAMETHASONE SODIUM PHOSPHATE 10 MG/ML IJ SOLN
INTRAMUSCULAR | Status: AC
  Filled 2019-12-03: qty 1

## 2019-12-03 MED ORDER — FLEET ENEMA 7-19 GM/118ML RE ENEM: 1.0000 | ENEMA | Freq: Once | RECTAL | Status: DC | PRN

## 2019-12-03 MED ORDER — MIDAZOLAM HCL 2 MG/2ML IJ SOLN
1.0000 mg | INTRAMUSCULAR | Status: DC
  Administered 2019-12-03: 1 mg via INTRAVENOUS
  Filled 2019-12-03: qty 2

## 2019-12-03 MED ORDER — TRANEXAMIC ACID-NACL 1000-0.7 MG/100ML-% IV SOLN
1000.0000 mg | INTRAVENOUS | Status: AC
  Administered 2019-12-03: 1000 mg via INTRAVENOUS
  Filled 2019-12-03: qty 100

## 2019-12-03 MED ORDER — TIZANIDINE HCL 2 MG PO TABS
2.0000 mg | ORAL_TABLET | Freq: Four times a day (QID) | ORAL | 0 refills | Status: DC | PRN
Start: 2019-12-03 — End: 2020-09-10

## 2019-12-03 MED ORDER — SODIUM CHLORIDE (PF) 0.9 % IJ SOLN
INTRAMUSCULAR | Status: DC | PRN
  Administered 2019-12-03: 20 mL

## 2019-12-03 MED ORDER — ORAL CARE MOUTH RINSE: 15.0000 mL | Freq: Once | OROMUCOSAL | Status: AC

## 2019-12-03 MED ORDER — METOCLOPRAMIDE HCL 5 MG/ML IJ SOLN: 5.0000 mg | Freq: Three times a day (TID) | INTRAMUSCULAR | Status: DC | PRN

## 2019-12-03 MED ORDER — POLYETHYLENE GLYCOL 3350 17 G PO PACK: 17.0000 g | PACK | Freq: Every day | ORAL | Status: DC | PRN

## 2019-12-03 MED ORDER — FENTANYL CITRATE (PF) 100 MCG/2ML IJ SOLN
50.0000 ug | INTRAMUSCULAR | Status: DC
  Administered 2019-12-03: 50 ug via INTRAVENOUS
  Filled 2019-12-03: qty 2

## 2019-12-03 MED ORDER — METHOCARBAMOL 500 MG IVPB - SIMPLE MED
500.0000 mg | Freq: Four times a day (QID) | INTRAVENOUS | Status: DC | PRN
  Administered 2019-12-03: 500 mg via INTRAVENOUS
  Filled 2019-12-03: qty 50

## 2019-12-03 MED ORDER — EPHEDRINE SULFATE-NACL 50-0.9 MG/10ML-% IV SOSY
PREFILLED_SYRINGE | INTRAVENOUS | Status: DC | PRN
  Administered 2019-12-03: 10 mg via INTRAVENOUS

## 2019-12-03 MED ORDER — EPHEDRINE 5 MG/ML INJ
INTRAVENOUS | Status: AC
  Filled 2019-12-03: qty 10

## 2019-12-03 MED ORDER — BISACODYL 5 MG PO TBEC: 5.0000 mg | DELAYED_RELEASE_TABLET | Freq: Every day | ORAL | Status: DC | PRN

## 2019-12-03 MED ORDER — BUPIVACAINE LIPOSOME 1.3 % IJ SUSP
INTRAMUSCULAR | Status: DC | PRN
  Administered 2019-12-03: 20 mL

## 2019-12-03 MED ORDER — DIPHENHYDRAMINE HCL 12.5 MG/5ML PO ELIX
12.5000 mg | ORAL_SOLUTION | ORAL | Status: DC | PRN
  Administered 2019-12-04: 25 mg via ORAL
  Filled 2019-12-03: qty 10

## 2019-12-03 MED ORDER — BUPIVACAINE HCL (PF) 0.25 % IJ SOLN
INTRAMUSCULAR | Status: DC | PRN
  Administered 2019-12-03: 30 mL

## 2019-12-03 MED ORDER — PROPOFOL 10 MG/ML IV BOLUS
INTRAVENOUS | Status: DC | PRN
  Administered 2019-12-03: 150 mg via INTRAVENOUS

## 2019-12-03 MED ORDER — PROPOFOL 10 MG/ML IV BOLUS
INTRAVENOUS | Status: AC
  Filled 2019-12-03: qty 20

## 2019-12-03 MED ORDER — SODIUM CHLORIDE (PF) 0.9 % IJ SOLN
INTRAMUSCULAR | Status: AC
  Filled 2019-12-03: qty 20

## 2019-12-03 MED ORDER — PHENOL 1.4 % MT LIQD: 1.0000 | OROMUCOSAL | Status: DC | PRN

## 2019-12-03 MED ORDER — HYDROMORPHONE HCL 1 MG/ML IJ SOLN: 0.5000 mg | INTRAMUSCULAR | Status: DC | PRN

## 2019-12-03 MED ORDER — KCL IN DEXTROSE-NACL 20-5-0.45 MEQ/L-%-% IV SOLN
INTRAVENOUS | Status: DC
  Filled 2019-12-03 (×3): qty 1000

## 2019-12-03 MED ORDER — CHLORHEXIDINE GLUCONATE 0.12 % MT SOLN
15.0000 mL | Freq: Once | OROMUCOSAL | Status: AC
  Administered 2019-12-03: 15 mL via OROMUCOSAL

## 2019-12-03 MED ORDER — METOCLOPRAMIDE HCL 5 MG PO TABS: 5.0000 mg | ORAL_TABLET | Freq: Three times a day (TID) | ORAL | Status: DC | PRN

## 2019-12-03 MED ORDER — FENTANYL CITRATE (PF) 100 MCG/2ML IJ SOLN
INTRAMUSCULAR | Status: DC | PRN
  Administered 2019-12-03 (×4): 25 ug via INTRAVENOUS

## 2019-12-03 MED ORDER — METHOCARBAMOL 500 MG PO TABS
500.0000 mg | ORAL_TABLET | Freq: Four times a day (QID) | ORAL | Status: DC | PRN
  Administered 2019-12-03: 500 mg via ORAL
  Filled 2019-12-03: qty 1

## 2019-12-03 MED ORDER — OXYCODONE-ACETAMINOPHEN 5-325 MG PO TABS: 1.0000 | ORAL_TABLET | ORAL | 0 refills | Status: DC | PRN

## 2019-12-03 MED ORDER — SODIUM CHLORIDE (PF) 0.9 % IJ SOLN
INTRAMUSCULAR | Status: AC
  Filled 2019-12-03: qty 50

## 2019-12-03 MED ORDER — APIXABAN 2.5 MG PO TABS
2.5000 mg | ORAL_TABLET | Freq: Two times a day (BID) | ORAL | 0 refills | Status: DC
Start: 2019-12-03 — End: 2020-01-24

## 2019-12-03 MED ORDER — GABAPENTIN 100 MG PO CAPS
100.0000 mg | ORAL_CAPSULE | Freq: Three times a day (TID) | ORAL | Status: DC
  Administered 2019-12-03 – 2019-12-04 (×3): 100 mg via ORAL
  Filled 2019-12-03 (×3): qty 1

## 2019-12-03 MED ORDER — ROPIVACAINE HCL 7.5 MG/ML IJ SOLN
INTRAMUSCULAR | Status: DC | PRN
  Administered 2019-12-03: 20 mL via PERINEURAL

## 2019-12-03 MED ORDER — FENTANYL CITRATE (PF) 100 MCG/2ML IJ SOLN
INTRAMUSCULAR | Status: AC
  Filled 2019-12-03: qty 2

## 2019-12-03 MED ORDER — ONDANSETRON HCL 4 MG PO TABS: 4.0000 mg | ORAL_TABLET | Freq: Four times a day (QID) | ORAL | Status: DC | PRN

## 2019-12-03 MED ORDER — 0.9 % SODIUM CHLORIDE (POUR BTL) OPTIME
TOPICAL | Status: DC | PRN
  Administered 2019-12-03: 1000 mL

## 2019-12-03 MED ORDER — METHOCARBAMOL 500 MG IVPB - SIMPLE MED
INTRAVENOUS | Status: AC
  Filled 2019-12-03: qty 50

## 2019-12-03 MED ORDER — WATER FOR IRRIGATION, STERILE IR SOLN
Status: DC | PRN
  Administered 2019-12-03: 2000 mL

## 2019-12-03 MED ORDER — ONDANSETRON HCL 4 MG/2ML IJ SOLN
INTRAMUSCULAR | Status: AC
  Filled 2019-12-03: qty 2

## 2019-12-03 MED ORDER — PROPOFOL 1000 MG/100ML IV EMUL
INTRAVENOUS | Status: AC
  Filled 2019-12-03: qty 100

## 2019-12-03 MED ORDER — ACETAMINOPHEN 325 MG PO TABS: 325.0000 mg | ORAL_TABLET | Freq: Four times a day (QID) | ORAL | Status: DC | PRN

## 2019-12-03 MED ORDER — CEFAZOLIN SODIUM-DEXTROSE 2-4 GM/100ML-% IV SOLN
2.0000 g | INTRAVENOUS | Status: AC
  Administered 2019-12-03: 2 g via INTRAVENOUS
  Filled 2019-12-03: qty 100

## 2019-12-03 MED ORDER — ALUM & MAG HYDROXIDE-SIMETH 200-200-20 MG/5ML PO SUSP: 30.0000 mL | ORAL | Status: DC | PRN

## 2019-12-03 MED ORDER — POVIDONE-IODINE 10 % EX SWAB
2.0000 "application " | Freq: Once | CUTANEOUS | Status: AC
  Administered 2019-12-03: 2 via TOPICAL

## 2019-12-03 MED ORDER — PANTOPRAZOLE SODIUM 40 MG PO TBEC
40.0000 mg | DELAYED_RELEASE_TABLET | Freq: Every day | ORAL | Status: DC
  Administered 2019-12-04: 40 mg via ORAL
  Filled 2019-12-03: qty 1

## 2019-12-03 MED ORDER — DILTIAZEM HCL ER COATED BEADS 180 MG PO CP24
180.0000 mg | ORAL_CAPSULE | Freq: Every morning | ORAL | Status: DC
  Administered 2019-12-04: 180 mg via ORAL
  Filled 2019-12-03: qty 1

## 2019-12-03 MED ORDER — DOCUSATE SODIUM 100 MG PO CAPS
100.0000 mg | ORAL_CAPSULE | Freq: Two times a day (BID) | ORAL | Status: DC
  Administered 2019-12-03 – 2019-12-04 (×2): 100 mg via ORAL
  Filled 2019-12-03 (×2): qty 1

## 2019-12-03 MED ORDER — SODIUM CHLORIDE 0.9 % IR SOLN
Status: DC | PRN
  Administered 2019-12-03: 1000 mL

## 2019-12-03 MED ORDER — TRANEXAMIC ACID 1000 MG/10ML IV SOLN
INTRAVENOUS | Status: DC | PRN
  Administered 2019-12-03: 2000 mg via TOPICAL

## 2019-12-03 MED ORDER — MENTHOL 3 MG MT LOZG: 1.0000 | LOZENGE | OROMUCOSAL | Status: DC | PRN

## 2019-12-03 MED ORDER — DEXAMETHASONE SODIUM PHOSPHATE 10 MG/ML IJ SOLN
INTRAMUSCULAR | Status: DC | PRN
  Administered 2019-12-03: 8 mg via INTRAVENOUS

## 2019-12-03 MED ORDER — ONDANSETRON HCL 4 MG/2ML IJ SOLN
INTRAMUSCULAR | Status: DC | PRN
  Administered 2019-12-03: 4 mg via INTRAVENOUS

## 2019-12-03 MED ORDER — APIXABAN 2.5 MG PO TABS
2.5000 mg | ORAL_TABLET | Freq: Two times a day (BID) | ORAL | Status: DC
  Administered 2019-12-04: 2.5 mg via ORAL
  Filled 2019-12-03: qty 1

## 2019-12-03 SURGICAL SUPPLY — 55 items
ATTUNE MED DOME PAT 38 KNEE (Knees) ×2 IMPLANT
ATTUNE MED DOME PAT 38MM KNEE (Knees) ×1 IMPLANT
ATTUNE PS FEM RT SZ 7 CEM KNEE (Femur) ×3 IMPLANT
ATTUNE PSRP INSR SZ7 5 KNEE (Insert) ×2 IMPLANT
ATTUNE PSRP INSR SZ7 5MM KNEE (Insert) ×1 IMPLANT
BAG DECANTER FOR FLEXI CONT (MISCELLANEOUS) ×3 IMPLANT
BAG ZIPLOCK 12X15 (MISCELLANEOUS) ×3 IMPLANT
BASE TIBIAL ROT PLAT SZ 8 KNEE (Knees) ×1 IMPLANT
BLADE SAG 18X100X1.27 (BLADE) ×3 IMPLANT
BLADE SAW SGTL 11.0X1.19X90.0M (BLADE) ×3 IMPLANT
BLADE SURG SZ10 CARB STEEL (BLADE) ×6 IMPLANT
BNDG ELASTIC 6X10 VLCR STRL LF (GAUZE/BANDAGES/DRESSINGS) ×3 IMPLANT
BNDG ELASTIC 6X15 VLCR STRL LF (GAUZE/BANDAGES/DRESSINGS) ×3 IMPLANT
BOWL SMART MIX CTS (DISPOSABLE) ×3 IMPLANT
CEMENT HV SMART SET (Cement) ×6 IMPLANT
COVER SURGICAL LIGHT HANDLE (MISCELLANEOUS) ×3 IMPLANT
COVER WAND RF STERILE (DRAPES) IMPLANT
CUFF TOURN SGL QUICK 34 (TOURNIQUET CUFF) ×3
CUFF TRNQT CYL 34X4.125X (TOURNIQUET CUFF) ×1 IMPLANT
DECANTER SPIKE VIAL GLASS SM (MISCELLANEOUS) ×9 IMPLANT
DRAPE U-SHAPE 47X51 STRL (DRAPES) ×3 IMPLANT
DRSG AQUACEL AG ADV 3.5X10 (GAUZE/BANDAGES/DRESSINGS) ×3 IMPLANT
DURAPREP 26ML APPLICATOR (WOUND CARE) ×3 IMPLANT
ELECT REM PT RETURN 15FT ADLT (MISCELLANEOUS) ×3 IMPLANT
GLOVE BIO SURGEON STRL SZ7.5 (GLOVE) ×3 IMPLANT
GLOVE BIO SURGEON STRL SZ8.5 (GLOVE) ×3 IMPLANT
GLOVE BIOGEL PI IND STRL 8 (GLOVE) ×1 IMPLANT
GLOVE BIOGEL PI IND STRL 9 (GLOVE) ×1 IMPLANT
GLOVE BIOGEL PI INDICATOR 8 (GLOVE) ×2
GLOVE BIOGEL PI INDICATOR 9 (GLOVE) ×2
GOWN STRL REUS W/TWL XL LVL3 (GOWN DISPOSABLE) ×6 IMPLANT
HANDPIECE INTERPULSE COAX TIP (DISPOSABLE) ×2
HOOD PEEL AWAY FLYTE STAYCOOL (MISCELLANEOUS) ×9 IMPLANT
IMMOBILIZER KNEE 20 (SOFTGOODS) ×3
IMMOBILIZER KNEE 20 THIGH 36 (SOFTGOODS) ×1 IMPLANT
KIT TURNOVER KIT A (KITS) IMPLANT
NEEDLE HYPO 21X1.5 SAFETY (NEEDLE) ×6 IMPLANT
NS IRRIG 1000ML POUR BTL (IV SOLUTION) ×3 IMPLANT
PACK ICE MAXI GEL EZY WRAP (MISCELLANEOUS) ×3 IMPLANT
PACK TOTAL KNEE CUSTOM (KITS) ×3 IMPLANT
PENCIL SMOKE EVACUATOR (MISCELLANEOUS) IMPLANT
PIN DRILL FIX HALF THREAD (BIT) ×3 IMPLANT
PIN STEINMAN FIXATION KNEE (PIN) ×3 IMPLANT
PROTECTOR NERVE ULNAR (MISCELLANEOUS) ×3 IMPLANT
SET HNDPC FAN SPRY TIP SCT (DISPOSABLE) ×1 IMPLANT
SUT VIC AB 1 CTX 36 (SUTURE) ×2
SUT VIC AB 1 CTX36XBRD ANBCTR (SUTURE) ×1 IMPLANT
SUT VIC AB 3-0 CT1 27 (SUTURE) ×6
SUT VIC AB 3-0 CT1 TAPERPNT 27 (SUTURE) ×3 IMPLANT
SYR CONTROL 10ML LL (SYRINGE) ×6 IMPLANT
TIBIAL BASE ROT PLAT SZ 8 KNEE (Knees) ×3 IMPLANT
TRAY FOLEY MTR SLVR 16FR STAT (SET/KITS/TRAYS/PACK) ×3 IMPLANT
WATER STERILE IRR 1000ML POUR (IV SOLUTION) ×6 IMPLANT
WRAP KNEE MAXI GEL POST OP (GAUZE/BANDAGES/DRESSINGS) ×3 IMPLANT
YANKAUER SUCT BULB TIP 10FT TU (MISCELLANEOUS) ×3 IMPLANT

## 2019-12-03 NOTE — Progress Notes (Signed)
AssistedDr. Carswell Jackson with right, ultrasound guided, adductor canal block. Side rails up, monitors on throughout procedure. See vital signs in flow sheet. Tolerated Procedure well.  

## 2019-12-03 NOTE — Progress Notes (Signed)
Orthopedic Tech Progress Note Patient Details:  [name] [birth year] [id number]  Ortho Devices Type of Ortho Device: Bone foam zero knee Ortho Device/Splint Interventions: Application   Post Interventions Patient Tolerated: Well Instructions Provided: Care of device   [name] 12/03/2019, 2:36 PM

## 2019-12-03 NOTE — Op Note (Signed)
PATIENT ID:      [name]  MRN:     [id number] DOB/AGE:    [birth year] / 79 y.o.       OPERATIVE REPORT   DATE OF PROCEDURE:  12/03/2019      PREOPERATIVE DIAGNOSIS:   RIGHT KNEE OSTEOARTHRITIS      Estimated body mass index is 35.28 kg/m as calculated from the following:   Height as of 11/22/19: 5\' 8"  (1.727 m).   Weight as of 11/22/19: 105.2 kg.                                                       POSTOPERATIVE DIAGNOSIS:   Same                                                                  PROCEDURE:  Procedure(s): RIGHT TOTAL KNEE ARTHROPLASTY Using DepuyAttune RP implants #7R Femur, #8Tibia, 5 mm Attune RP bearing, 38 Patella    SURGEON: Kerin Salen  ASSISTANT:   Kerry Hough. [employer]   (Present and scrubbed throughout the case, critical for assistance with exposure, retraction, instrumentation, and closure.)        ANESTHESIA: GET, 20cc Exparel, 50cc 0.25% Marcaine EBL: 300 cc FLUID REPLACEMENT: 1600 cc crystaloid TOURNIQUET: DRAINS: None TRANEXAMIC ACID: 1gm IV, 2gm topical COMPLICATIONS:  None         INDICATIONS FOR PROCEDURE: The patient has  RIGHT KNEE OSTEOARTHRITIS, Var deformities, XR shows bone on bone arthritis, lateral subluxation of tibia. Patient has failed all conservative measures including anti-inflammatory medicines, narcotics, attempts at exercise and weight loss, cortisone injections and viscosupplementation.  Risks and benefits of surgery have been discussed, questions answered.   DESCRIPTION OF PROCEDURE: The patient identified by armband, received  IV antibiotics, in the holding area at [facility] Patient taken to the operating room, appropriate anesthetic monitors were attached, and GET anesthesia was  induced. IV Tranexamic acid was given.Tourniquet applied high to the operative thigh. Lateral post and foot positioner applied to the table, the lower extremity was then prepped and draped in usual sterile fashion from the toes to the  tourniquet. Time-out procedure was performed. Kerry Hough. [facility] PAC, was present and scrubbed throughout the case, critical for assistance with, positioning, exposure, retraction, instrumentation, and closure.The skin and subcutaneous tissue along the incision was injected with 20 cc of a mixture of Exparel and Marcaine solution, using a 20-gauge by 1-1/2 inch needle. We began the operation, with the knee flexed 130 degrees, by making the anterior midline incision starting at handbreadth above the patella going over the patella 1 cm medial to and 4 cm distal to the tibial tubercle. Small bleeders in the skin and the subcutaneous tissue identified and cauterized. Transverse retinaculum was incised and reflected medially and a medial parapatellar arthrotomy was accomplished. the patella was everted and theprepatellar fat pad resected. The superficial medial collateral ligament was then elevated from anterior to posterior along the proximal flare of the tibia and anterior half of the menisci resected. The knee was hyperflexed exposing bone on bone arthritis. Peripheral and notch  osteophytes as well as the cruciate ligaments were then resected. We continued to work our way around posteriorly along the proximal tibia, and externally rotated the tibia subluxing it out from underneath the femur. A McHale PCL retractor was placed through the notch and a lateral Hohmann retractor placed, and we then entered the proximal tibia in line with the Depuy starter drill in line with the axis of the tibia followed by an intramedullary guide rod and 0-degree posterior slope cutting guide. The tibial cutting guide, 4 degree posterior sloped, was pinned into place allowing resection of 6 mm of bone medially and 14 mm of bone laterally. Satisfied with the tibial resection, we then entered the distal femur 2 mm anterior to the PCL origin with the intramedullary guide rod and applied the distal femoral cutting guide set at 9 mm, with 5  degrees of valgus. This was pinned along the epicondylar axis. At this point, the distal femoral cut was accomplished without difficulty. We then sized for a #7R femoral component and pinned the guide in 3 degrees of external rotation. The chamfer cutting guide was pinned into place. The anterior, posterior, and chamfer cuts were accomplished without difficulty followed by the Attune RP box cutting guide and the box cut. We also removed posterior osteophytes from the posterior femoral condyles. The posterior capsule was injected with Exparel solution. The knee was brought into full extension. We checked our extension gap and fit a 5 mm bearing. Distracting in extension with a lamina spreader,  bleeders in the posterior capsule, Posterior medial and posterior lateral gutter were cauterized.  The transexamic acid-soaked sponge was then placed in the gap of the knee in extension. The knee was flexed 30. The posterior patella cut was accomplished with the 9.5 mm Attune cutting guide, sized for a 59mm dome, and the fixation pegs drilled.The knee was then once again hyperflexed exposing the proximal tibia. We sized for a # 8 tibial base plate, applied the smokestack and the conical reamer followed by the the Delta fin keel punch. We then hammered into place the Attune RP trial femoral component, drilled the lugs, inserted a  5 mm trial bearing, trial patellar button, and took the knee through range of motion from 0-130 degrees. Medial and lateral ligamentous stability was checked. No thumb pressure was required for patellar Tracking. The tourniquet was not used. All trial components were removed, mating surfaces irrigated with pulse lavage, and dried with suction and sponges. 10 cc of the Exparel solution was applied to the cancellus bone of the patella distal femur and proximal tibia.  After waiting 30 seconds, the bony surfaces were again, dried with sponges. A double batch of DePuy HV cement was mixed and applied to  all bony metallic mating surfaces except for the posterior condyles of the femur itself. In order, we hammered into place the tibial tray and removed excess cement, the femoral component and removed excess cement. The final Attune RP bearing was inserted, and the knee brought to full extension with compression. The patellar button was clamped into place, and excess cement removed. The knee was held at 30 flexion with compression, while the cement cured. The wound was irrigated out with normal saline solution pulse lavage. The rest of the Exparel was injected into the parapatellar arthrotomy, subcutaneous tissues, and periosteal tissues. The parapatellar arthrotomy was closed with running #1 Vicryl suture. The subcutaneous tissue with 3-0 undyed Vicryl suture, and the skin with running 3-0 SQ vicryl. An Aquacil and Ace wrap  were applied. The patient was taken to recovery room without difficulty.   Kerin Salen 12/03/2019, 10:27 AM

## 2019-12-03 NOTE — Anesthesia Procedure Notes (Signed)
Procedure Name: LMA Insertion Date/Time: 12/03/2019 12:13 PM Performed by: Niel Hummer, CRNA Pre-anesthesia Checklist: Patient identified, Emergency Drugs available, Suction available and Patient being monitored Patient Re-evaluated:Patient Re-evaluated prior to induction Oxygen Delivery Method: Circle system utilized Preoxygenation: Pre-oxygenation with 100% oxygen Induction Type: IV induction LMA: LMA with gastric port inserted LMA Size: 5.0 Number of attempts: 1 Dental Injury: Teeth and Oropharynx as per pre-operative assessment

## 2019-12-03 NOTE — Anesthesia Procedure Notes (Signed)
Anesthesia Regional Block: Adductor canal block   Pre-Anesthetic Checklist: ,, timeout performed, Correct Patient, Correct Site, Correct Laterality, Correct Procedure, Correct Position, site marked, Risks and benefits discussed,  Surgical consent,  Pre-op evaluation,  At surgeon's request and post-op pain management  Laterality: Right and Lower  Prep: chloraprep       Needles:  Injection technique: Single-shot     Needle Length: 9cm  Needle Gauge: 21     Additional Needles:   Procedures:,,,, ultrasound used (permanent image in chart),,,,  Narrative:  Start time: 12/03/2019 11:51 AM End time: 12/03/2019 11:57 AM Injection made incrementally with aspirations every 5 mL.  Performed by: Personally  Anesthesiologist: Annye Asa, MD  Additional Notes: Pt identified in Holding room.  Monitors applied. Working IV access confirmed. Sterile prep R thigh.  #21ga PNS into adductor canal with US guidance.  20cc 0.75% Ropivacaine injected incrementally after negative test dose.  Patient asymptomatic, VSS, no heme aspirated, tolerated well.  Jenita Seashore, MD

## 2019-12-03 NOTE — Transfer of Care (Signed)
Immediate Anesthesia Transfer of Care Note  Patient: [name]  Procedure(s) Performed: RIGHT TOTAL KNEE ARTHROPLASTY (Right Knee)  Patient Location: PACU  Anesthesia Type:General  Level of Consciousness: awake, alert  and oriented  Airway & Oxygen Therapy: Patient Spontanous Breathing and Patient connected to face mask oxygen  Post-op Assessment: Report given to RN, Post -op Vital signs reviewed and stable and Patient moving all extremities X 4  Post vital signs: Reviewed and stable  Last Vitals:  Vitals Value Taken Time  BP 135/58 12/03/19 1408  Temp 36.4 C 12/03/19 1408  Pulse 60 12/03/19 1408  Resp 15 12/03/19 1408  SpO2 98 % 12/03/19 1408    Last Pain:  Vitals:   12/03/19 1408  TempSrc:   PainSc: Asleep         Complications: No complications documented.

## 2019-12-03 NOTE — Discharge Instructions (Addendum)
INSTRUCTIONS AFTER JOINT REPLACEMENT   o Remove items at home which could result in a fall. This includes throw rugs or furniture in walking pathways o ICE to the affected joint every three hours while awake for 30 minutes at a time, for at least the first 3-5 days, and then as needed for pain and swelling.  Continue to use ice for pain and swelling. You may notice swelling that will progress down to the foot and ankle.  This is normal after surgery.  Elevate your leg when you are not up walking on it.   o Continue to use the breathing machine you got in the hospital (incentive spirometer) which will help keep your temperature down.  It is common for your temperature to cycle up and down following surgery, especially at night when you are not up moving around and exerting yourself.  The breathing machine keeps your lungs expanded and your temperature down.   DIET:  As you were doing prior to hospitalization, we recommend a well-balanced diet.  DRESSING / WOUND CARE / SHOWERING  Keep the surgical dressing until follow up.  The dressing is water proof, so you can shower without any extra covering.  IF THE DRESSING FALLS OFF or the wound gets wet inside, change the dressing with sterile gauze.  Please use good hand washing techniques before changing the dressing.  Do not use any lotions or creams on the incision until instructed by your surgeon.    ACTIVITY  o Increase activity slowly as tolerated, but follow the weight bearing instructions below.   o No driving for 6 weeks or until further direction given by your physician.  You cannot drive while taking narcotics.  o No lifting or carrying greater than 10 lbs. until further directed by your surgeon. o Avoid periods of inactivity such as sitting longer than an hour when not asleep. This helps prevent blood clots.  o You may return to work once you are authorized by your doctor.     WEIGHT BEARING   Weight bearing as tolerated with assist  device (walker, cane, etc) as directed, use it as long as suggested by your surgeon or therapist, typically at least 4-6 weeks.   EXERCISES  Results after joint replacement surgery are often greatly improved when you follow the exercise, range of motion and muscle strengthening exercises prescribed by your doctor. Safety measures are also important to protect the joint from further injury. Any time any of these exercises cause you to have increased pain or swelling, decrease what you are doing until you are comfortable again and then slowly increase them. If you have problems or questions, call your caregiver or physical therapist for advice.   Rehabilitation is important following a joint replacement. After just a few days of immobilization, the muscles of the leg can become weakened and shrink (atrophy).  These exercises are designed to build up the tone and strength of the thigh and leg muscles and to improve motion. Often times heat used for twenty to thirty minutes before working out will loosen up your tissues and help with improving the range of motion but do not use heat for the first two weeks following surgery (sometimes heat can increase post-operative swelling).   These exercises can be done on a training (exercise) mat, on the floor, on a table or on a bed. Use whatever works the best and is most comfortable for you.    Use music or television while you are exercising so that   the exercises are a pleasant break in your day. This will make your life better with the exercises acting as a break in your routine that you can look forward to.   Perform all exercises about fifteen times, three times per day or as directed.  You should exercise both the operative leg and the other leg as well.  Exercises include:   . Quad Sets - Tighten up the muscle on the front of the thigh (Quad) and hold for 5-10 seconds.   . Straight Leg Raises - With your knee straight (if you were given a brace, keep it on),  lift the leg to 60 degrees, hold for 3 seconds, and slowly lower the leg.  Perform this exercise against resistance later as your leg gets stronger.  . Leg Slides: Lying on your back, slowly slide your foot toward your buttocks, bending your knee up off the floor (only go as far as is comfortable). Then slowly slide your foot back down until your leg is flat on the floor again.  . Angel Wings: Lying on your back spread your legs to the side as far apart as you can without causing discomfort.  . Hamstring Strength:  Lying on your back, push your heel against the floor with your leg straight by tightening up the muscles of your buttocks.  Repeat, but this time bend your knee to a comfortable angle, and push your heel against the floor.  You may put a pillow under the heel to make it more comfortable if necessary.   A rehabilitation program following joint replacement surgery can speed recovery and prevent re-injury in the future due to weakened muscles. Contact your doctor or a physical therapist for more information on knee rehabilitation.    CONSTIPATION  Constipation is defined medically as fewer than three stools per week and severe constipation as less than one stool per week.  Even if you have a regular bowel pattern at home, your normal regimen is likely to be disrupted due to multiple reasons following surgery.  Combination of anesthesia, postoperative narcotics, change in appetite and fluid intake all can affect your bowels.   YOU MUST use at least one of the following options; they are listed in order of increasing strength to get the job done.  They are all available over the counter, and you may need to use some, POSSIBLY even all of these options:    Drink plenty of fluids (prune juice may be helpful) and high fiber foods Colace 100 mg by mouth twice a day  Senokot for constipation as directed and as needed Dulcolax (bisacodyl), take with full glass of water  Miralax (polyethylene glycol)  once or twice a day as needed.  If you have tried all these things and are unable to have a bowel movement in the first 3-4 days after surgery call either your surgeon or your primary doctor.    If you experience loose stools or diarrhea, hold the medications until you stool forms back up.  If your symptoms do not get better within 1 week or if they get worse, check with your doctor.  If you experience "the worst abdominal pain ever" or develop nausea or vomiting, please contact the office immediately for further recommendations for treatment.   ITCHING:  If you experience itching with your medications, try taking only a single pain pill, or even half a pain pill at a time.  You can also use Benadryl over the counter for itching or also to   help with sleep.   TED HOSE STOCKINGS:  Use stockings on both legs until for at least 2 weeks or as directed by physician office. They may be removed at night for sleeping.  MEDICATIONS:  See your medication summary on the "After Visit Summary" that nursing will review with you.  You may have some home medications which will be placed on hold until you complete the course of blood thinner medication.  It is important for you to complete the blood thinner medication as prescribed.  PRECAUTIONS:  If you experience chest pain or shortness of breath - call 911 immediately for transfer to the hospital emergency department.   If you develop a fever greater that 101 F, purulent drainage from wound, increased redness or drainage from wound, foul odor from the wound/dressing, or calf pain - CONTACT YOUR SURGEON.                                                   FOLLOW-UP APPOINTMENTS:  If you do not already have a post-op appointment, please call the office for an appointment to be seen by your surgeon.  Guidelines for how soon to be seen are listed in your "After Visit Summary", but are typically between 1-4 weeks after surgery.  OTHER INSTRUCTIONS:   Knee  Replacement:  Do not place pillow under knee, focus on keeping the knee straight while resting. CPM instructions: 0-90 degrees, 2 hours in the morning, 2 hours in the afternoon, and 2 hours in the evening. Place foam block, curve side up under heel at all times except when in CPM or when walking.  DO NOT modify, tear, cut, or change the foam block in any way.   DENTAL ANTIBIOTICS:  In most cases prophylactic antibiotics for Dental procdeures after total joint surgery are not necessary.  Exceptions are as follows:  1. History of prior total joint infection  2. Severely immunocompromised (Organ Transplant, cancer chemotherapy, Rheumatoid biologic meds such as [place])  3. Poorly controlled diabetes (A1C &gt; 8.0, blood glucose over 200)  If you have one of these conditions, contact your surgeon for an antibiotic prescription, prior to your dental procedure.   MAKE SURE YOU:  . Understand these instructions.  . Get help right away if you are not doing well or get worse.    Thank you for letting us be a part of your medical care team.  It is a privilege we respect greatly.  We hope these instructions will help you stay on track for a fast and full recovery!   Information on my medicine - ELIQUIS (apixaban)  This medication education was reviewed with me or my healthcare representative as part of my discharge preparation.  The pharmacist that spoke with me during my hospital stay was:   Why was Eliquis prescribed for you? Eliquis was prescribed for you to reduce the risk of blood clots forming after orthopedic surgery.    What do You need to know about Eliquis? Take your Eliquis TWICE DAILY - one tablet in the morning and one tablet in the evening with or without food.  It would be best to take the dose about the same time each day.  If you have difficulty swallowing the tablet whole please discuss with your pharmacist how to take the medication safely.  Take Eliquis exactly as  prescribed by  your doctor and DO NOT stop taking Eliquis without talking to the doctor who prescribed the medication.  Stopping without other medication to take the place of Eliquis may increase your risk of developing a clot.  After discharge, you should have regular check-up appointments with your healthcare provider that is prescribing your Eliquis.  What do you do if you miss a dose? If a dose of ELIQUIS is not taken at the scheduled time, take it as soon as possible on the same day and twice-daily administration should be resumed.  The dose should not be doubled to make up for a missed dose.  Do not take more than one tablet of ELIQUIS at the same time.  Important Safety Information A possible side effect of Eliquis is bleeding. You should call your healthcare provider right away if you experience any of the following: ? Bleeding from an injury or your nose that does not stop. ? Unusual colored urine (red or dark brown) or unusual colored stools (red or black). ? Unusual bruising for unknown reasons. ? A serious fall or if you hit your head (even if there is no bleeding).  Some medicines may interact with Eliquis and might increase your risk of bleeding or clotting while on Eliquis. To help avoid this, consult your healthcare provider or pharmacist prior to using any new prescription or non-prescription medications, including herbals, vitamins, non-steroidal anti-inflammatory drugs (NSAIDs) and supplements.  This website has more information on Eliquis (apixaban): http://www.eliquis.com/eliquis/home

## 2019-12-03 NOTE — Anesthesia Preprocedure Evaluation (Addendum)
Anesthesia Evaluation  Patient identified by MRN, date of birth, ID band Patient awake    Reviewed: Allergy & Precautions, NPO status , Patient's Chart, lab work & pertinent test results  History of Anesthesia Complications Negative for: history of anesthetic complications  Airway Mallampati: II  TM Distance: >3 FB Neck ROM: Full    Dental  (+) Dental Advisory Given, Chipped   Pulmonary former smoker,  11/29/2019 SARS coronavirus NEG   breath sounds clear to auscultation       Cardiovascular hypertension, Pt. on medications + Peripheral Vascular Disease  + dysrhythmias Atrial Fibrillation  Rhythm:Regular Rate:Normal  '19 ECHO: EF 50-55%, valves OK   Neuro/Psych S/p Guillian-Barre x2  S/p meningioma resection   09/2019 MRI: Right parasagittal meningioma stable in size. Slight increase in vasogenic edema. Left anterior clinoid meningioma is mildly larger. Mild increase in adjacent edema. 9 mm of left middle cranial fossa meningioma unchanged, Large area of encephalomalacia left frontal lobe and insula unchanged. CVA    GI/Hepatic Neg liver ROS, GERD  Medicated,  Endo/Other  Morbid obesity  Renal/GU Renal InsufficiencyRenal disease     Musculoskeletal  (+) Arthritis ,   Abdominal (+) + obese,   Peds  Hematology eliquis   Anesthesia Other Findings   Reproductive/Obstetrics                           Anesthesia Physical Anesthesia Plan  ASA: III  Anesthesia Plan: General   Post-op Pain Management: GA combined w/ Regional for post-op pain   Induction: Intravenous  PONV Risk Score and Plan: 2 and Ondansetron and Dexamethasone  Airway Management Planned: LMA  Additional Equipment: None  Intra-op Plan:   Post-operative Plan:   Informed Consent: I have reviewed the patients History and Physical, chart, labs and discussed the procedure including the risks, benefits and alternatives for  the proposed anesthesia with the patient or authorized representative who has indicated his/her understanding and acceptance.     Dental advisory given  Plan Discussed with: CRNA and Surgeon  Anesthesia Plan Comments: (Pt has intracranial masses x3 with vasogenic edema, s/p meningioma resection, s/p Guillian-Barre x2   Plan routine monitors, GA with adductor canal block for post op analgesia)       Anesthesia Quick Evaluation

## 2019-12-03 NOTE — Anesthesia Postprocedure Evaluation (Signed)
Anesthesia Post Note  Patient: [name]  Procedure(s) Performed: RIGHT TOTAL KNEE ARTHROPLASTY (Right Knee)     Patient location during evaluation: PACU Anesthesia Type: General Level of consciousness: awake and alert, oriented and patient cooperative Pain management: pain level controlled Vital Signs Assessment: post-procedure vital signs reviewed and stable Respiratory status: spontaneous breathing, nonlabored ventilation and respiratory function stable Cardiovascular status: blood pressure returned to baseline and stable Postop Assessment: no apparent nausea or vomiting Anesthetic complications: no   No complications documented.  Last Vitals:  Vitals:   12/03/19 1445 12/03/19 1500  BP: (!) 142/58 131/61  Pulse: (!) 57 (!) 52  Resp: 14 16  Temp:    SpO2: 96% 96%    Last Pain:  Vitals:   12/03/19 1500  TempSrc:   PainSc: 0-No pain                 Jearl Soto,[name]

## 2019-12-03 NOTE — Evaluation (Signed)
Physical Therapy Evaluation Patient Details Name: [name] MRN: [id number] DOB: 1940/11/24 Today's Date: 12/03/2019   History of Present Illness  Pt is 79 yo male with hx of R knee arthritis who is s/p R TKA on 12/03/19.  Pt with history including CVA, guillain barre, afib, and HTN.  Clinical Impression  Pt is s/p R TKA on POD #0 resulting in the deficits listed below (see PT Problem List). Pt was able to ambulate 74' with RW and min A.  Did demonstrate mild knee buckling when progressing to partial reciprocal pattern requiring frequent cues for sequencing and step to pattern to prevent.  Pt with good quad activation with SLR and knee ext and good ROM for DOS in ACE wrap.  He was educated on exercises for quad strengthening and circulation.  Pt with good family support and necessary DME.  Expected to progress well. Pt will benefit from skilled PT to increase their independence and safety with mobility to allow discharge to the venue listed below.      Follow Up Recommendations Follow surgeon's recommendation for DC plan and follow-up therapies;Supervision/Assistance - 24 hour    Equipment Recommendations  None recommended by PT (Has RW and BSC)    Recommendations for Other Services       Precautions / Restrictions Precautions Precautions: Fall Required Braces or Orthoses: Knee Immobilizer - Right Knee Immobilizer - Right: Discontinue once straight leg raise with < 10 degree lag Restrictions Weight Bearing Restrictions: Yes RLE Weight Bearing: Weight bearing as tolerated      Mobility  Bed Mobility Overal bed mobility: Needs Assistance Bed Mobility: Supine to Sit     Supine to sit: Min assist     General bed mobility comments: Min A for R LE and to elevate trunk; use of bed rails  Transfers Overall transfer level: Needs assistance Equipment used: Rolling walker (2 wheeled) Transfers: Sit to/from Stand Sit to Stand: Min assist;From elevated surface          General transfer comment: Performed from elevated bed and elevated BSC.  Required cues for safe hand placement and R LE management.  Placed pillows in chair to elevate for return to bed later today.  Ambulation/Gait Ambulation/Gait assistance: Min assist Gait Distance (Feet): 80 Feet (80' then 15') Assistive device: Rolling walker (2 wheeled) Gait Pattern/deviations: Step-to pattern;Step-through pattern;Decreased weight shift to right;Decreased stride length;Decreased stance time - right Gait velocity: decreased   General Gait Details: Pt started with step to pattern but began to progress to partial reciprocal pattern.  However, with fatigue demonstrating mild buckling in R knee and required repeated cues to switch back to step to for safety.  Also, required cues for RW proximity and posture  Stairs            Wheelchair Mobility    Modified Rankin (Stroke Patients Only)       Balance Overall balance assessment: Needs assistance Sitting-balance support: No upper extremity supported Sitting balance-Leahy Scale: Good     Standing balance support: Bilateral upper extremity supported Standing balance-Leahy Scale: Poor Standing balance comment: required RW                             Pertinent Vitals/Pain Pain Assessment: No/denies pain    Home Living Family/patient expects to be discharged to:: Private residence Living Arrangements: Spouse/significant other Available Help at Discharge: Family;Available 24 hours/day Type of Home: House Home Access: Stairs to enter Entrance Stairs-Rails: Right  Entrance Stairs-Number of Steps: 2 Home Layout: Two level;Able to live on main level with bedroom/bathroom Home Equipment: Gilford Rile - 2 wheels;Cane - single point;Shower seat;Bedside commode      Prior Function Level of Independence: Independent   Gait / Transfers Assistance Needed: Can ambulate community distances; occasionally used cane because of knee pain  ADL's  / Homemaking Assistance Needed: Independent with ADLs and IADLs  Comments[place] and enjoys yard work.  Pt reports had Guillain Barre in 2017 but no residual deficits     Hand Dominance        Extremity/Trunk Assessment   Upper Extremity Assessment Upper Extremity Assessment: Overall WFL for tasks assessed    Lower Extremity Assessment Lower Extremity Assessment: LLE deficits/detail;RLE deficits/detail RLE Deficits / Details: R knee ROM lacked 5 ext, 85 flexion; R ankle and hip ROM WFL; MMT R ankle 5/5, knee 3/5, hip 3/5 RLE Sensation: WNL LLE Deficits / Details: ROM WFL and MMT 5/5 LLE Sensation: WNL    Cervical / Trunk Assessment Cervical / Trunk Assessment: Normal  Communication   Communication: No difficulties  Cognition Arousal/Alertness: Awake/alert Behavior During Therapy: WFL for tasks assessed/performed Overall Cognitive Status: Within Functional Limits for tasks assessed                                 General Comments: Required repeated cues for gait sequencing      General Comments General comments (skin integrity, edema, etc.): vss - pt was on 2 LPM O2 with sats 98%, placed on RA and sats maintained 96%; left on RA notified RN    Exercises Total Joint Exercises Ankle Circles/Pumps: AROM;Both;10 reps;Seated Quad Sets: AROM;Both;10 reps;Seated   Assessment/Plan    PT Assessment Patient needs continued PT services  PT Problem List Decreased strength;Decreased mobility;Decreased safety awareness;Decreased range of motion;Decreased coordination;Decreased activity tolerance;Cardiopulmonary status limiting activity;Decreased balance;Decreased knowledge of use of DME       PT Treatment Interventions DME instruction;Therapeutic activities;Gait training;Therapeutic exercise;Patient/family education;Modalities;Stair training;Balance training;Functional mobility training    PT Goals (Current goals can be found in the Care Plan section)   Acute Rehab PT Goals Patient Stated Goal: return home (wife reports hoping he can stay 2 nights if needed for safety) PT Goal Formulation: With patient/family Time For Goal Achievement: 12/17/19 Potential to Achieve Goals: Good Additional Goals Additional Goal #1: Pt will achieve 0 to 90 degrees R Knee ROM for stairs and gait    Frequency 7X/week   Barriers to discharge        Co-evaluation               AM-PAC PT "6 Clicks" Mobility  Outcome Measure Help needed turning from your back to your side while in a flat bed without using bedrails?: A Little Help needed moving from lying on your back to sitting on the side of a flat bed without using bedrails?: A Little Help needed moving to and from a bed to a chair (including a wheelchair)?: A Little Help needed standing up from a chair using your arms (e.g., wheelchair or bedside chair)?: A Little Help needed to walk in hospital room?: A Little Help needed climbing 3-5 steps with a railing? : A Little 6 Click Score: 18    End of Session Equipment Utilized During Treatment: Gait belt Activity Tolerance: Patient tolerated treatment well Patient left: with chair alarm set;in chair;with family/visitor present;with call bell/phone within reach Nurse Communication: Mobility  status PT Visit Diagnosis: Other abnormalities of gait and mobility (R26.89);Unsteadiness on feet (R26.81);Muscle weakness (generalized) (M62.81)    Time: 1694-5038 PT Time Calculation (min) (ACUTE ONLY): 32 min   Charges:   PT Evaluation $PT Eval Moderate Complexity: 1 Mod PT Treatments $Gait Training: 8-22 mins        Abran Richard, PT Acute Rehab Services Pager [phone] [facility] [place] 12/03/2019, 5:59 PM

## 2019-12-03 NOTE — Interval H&P Note (Signed)
History and Physical Interval Note:  12/03/2019 10:26 AM  [name]  has presented today for surgery, with the diagnosis of RIGHT KNEE OSTEOARTHRITIS.  The various methods of treatment have been discussed with the patient and family. After consideration of risks, benefits and other options for treatment, the patient has consented to  Procedure(s): RIGHT TOTAL KNEE ARTHROPLASTY (Right) as a surgical intervention.  The patient's history has been reviewed, patient examined, no change in status, stable for surgery.  I have reviewed the patient's chart and labs.  Questions were answered to the patient's satisfaction.     Kerin Salen

## 2019-12-04 DIAGNOSIS — M1711 Unilateral primary osteoarthritis, right knee: Secondary | ICD-10-CM | POA: Diagnosis not present

## 2019-12-04 LAB — BASIC METABOLIC PANEL
Anion gap: 9 (ref 5–15)
BUN: 20 mg/dL (ref 8–23)
CO2: 22 mmol/L (ref 22–32)
Calcium: 8.7 mg/dL — ABNORMAL LOW (ref 8.9–10.3)
Chloride: 107 mmol/L (ref 98–111)
Creatinine, Ser: 1.89 mg/dL — ABNORMAL HIGH (ref 0.61–1.24)
GFR calc Af Amer: 38 mL/min — ABNORMAL LOW (ref >60)
GFR calc non Af Amer: 33 mL/min — ABNORMAL LOW (ref >60)
Glucose, Bld: 184 mg/dL — ABNORMAL HIGH (ref 70–99)
Potassium: 4.8 mmol/L (ref 3.5–5.1)
Sodium: 138 mmol/L (ref 135–145)

## 2019-12-04 LAB — CBC
HCT: 41.7 % (ref 39.0–52.0)
Hemoglobin: 13 g/dL (ref 13.0–17.0)
MCH: 28.4 pg (ref 26.0–34.0)
MCHC: 31.2 g/dL (ref 30.0–36.0)
MCV: 91 fL (ref 80.0–100.0)
Platelets: 159 10*3/uL (ref 150–400)
RBC: 4.58 MIL/uL (ref 4.22–5.81)
RDW: 21.3 % — ABNORMAL HIGH (ref 11.5–15.5)
WBC: 19.5 10*3/uL — ABNORMAL HIGH (ref 4.0–10.5)
nRBC: 0 % (ref 0.0–0.2)

## 2019-12-04 NOTE — TOC Transition Note (Signed)
Transition of Care [facility]) - CM/SW Discharge Note   Patient Details  Name: [name] MRN: [id number] Date of Birth: 25-Jan-1941  [facility] CM/SW Contact:  Lia Hopping, [place] Phone Number: 12/04/2019, 10:31 AM   Clinical Narrative:    Therapy Plan: HHPT through [facility] Patient confirm he has a RW and 3 in1    Final next level of care: [place] Barriers to Discharge: Barriers Resolved   Patient Goals and CMS Choice     Choice offered to / list presented to : NA  Discharge Placement                       Discharge Plan and Services                DME Arranged: N/A DME Agency: NA       HH Arranged: PT [place] Agency: [facility] (formerly [employer]) Date [place]: 12/04/19 Time [place]: 1031 Representative spoke with at [place]: [place] ([place]) Interventions     Readmission Risk Interventions No flowsheet data found.

## 2019-12-04 NOTE — Progress Notes (Signed)
Physical Therapy Treatment Patient Details Name: [name] MRN: [id number] DOB: 1941-02-01 Today's Date: 12/04/2019    History of Present Illness Pt is 79 yo male with hx of R knee arthritis who is s/p R TKA on 12/03/19.  Pt with history including CVA, guillain barre, afib, and HTN.    PT Comments    Pt assisted with ambulating in hallway and performed LE exercises.  Pt slow with mobility today however reports little pain.  Will return to practice stairs prior to d/c.    Follow Up Recommendations  Follow surgeon's recommendation for DC plan and follow-up therapies;Supervision/Assistance - 24 hour     Equipment Recommendations  None recommended by PT    Recommendations for Other Services       Precautions / Restrictions Precautions Precautions: Fall;Knee Restrictions Weight Bearing Restrictions: No    Mobility  Bed Mobility Overal bed mobility: Needs Assistance Bed Mobility: Supine to Sit     Supine to sit: Min assist     General bed mobility comments: Min A for R LE and to elevate trunk; use of bed rails  Transfers Overall transfer level: Needs assistance Equipment used: Rolling walker (2 wheeled) Transfers: Sit to/from Stand Sit to Stand: Min guard         General transfer comment: verbal cues for UE and LE positioning  Ambulation/Gait Ambulation/Gait assistance: Min guard Gait Distance (Feet): 120 Feet Assistive device: Rolling walker (2 wheeled) Gait Pattern/deviations: Step-to pattern;Decreased weight shift to right;Decreased stance time - right Gait velocity: decreased   General Gait Details: verbal cues for sequence, RW positioning, posture   Stairs             Wheelchair Mobility    Modified Rankin (Stroke Patients Only)       Balance                                            Cognition Arousal/Alertness: Awake/alert Behavior During Therapy: WFL for tasks assessed/performed Overall Cognitive Status:  Within Functional Limits for tasks assessed                                        Exercises Total Joint Exercises Ankle Circles/Pumps: AROM;Both;10 reps Quad Sets: AROM;Both;10 reps Short Arc Quad: AROM;Right;10 reps Heel Slides: AAROM;Right;10 reps Hip ABduction/ADduction: AROM;Right;10 reps Straight Leg Raises: AROM;Right;10 reps Knee Flexion: AAROM;Seated;Right;10 reps    General Comments        Pertinent Vitals/Pain Pain Assessment: No/denies pain    Home Living                      Prior Function            PT Goals (current goals can now be found in the care plan section) Progress towards PT goals: Progressing toward goals    Frequency    7X/week      PT Plan Current plan remains appropriate    Co-evaluation              AM-PAC PT "6 Clicks" Mobility   Outcome Measure  Help needed turning from your back to your side while in a flat bed without using bedrails?: A Little Help needed moving from lying on your back to sitting on the side of a flat  bed without using bedrails?: A Little Help needed moving to and from a bed to a chair (including a wheelchair)?: A Little Help needed standing up from a chair using your arms (e.g., wheelchair or bedside chair)?: A Little Help needed to walk in hospital room?: A Little Help needed climbing 3-5 steps with a railing? : A Little 6 Click Score: 18    End of Session Equipment Utilized During Treatment: Gait belt Activity Tolerance: Patient tolerated treatment well Patient left: with call bell/phone within reach (pt left in bathroom on toilet per his request, aware to use pull cord and wait for assist)   PT Visit Diagnosis: Other abnormalities of gait and mobility (R26.89);Unsteadiness on feet (R26.81);Muscle weakness (generalized) (M62.81)     Time: 1001-1020 PT Time Calculation (min) (ACUTE ONLY): 19 min  Charges:  $Therapeutic Exercise: 8-22 mins                     Jannette Spanner PT,  DPT Acute Rehabilitation Services Pager: [phone] Office: [phone]  Shawnae Leiva,KATHrine E 12/04/2019, 1:03 PM

## 2019-12-04 NOTE — Progress Notes (Signed)
Patient ID: [name], male   DOB: [birth year], 79 y.o.   MRN: [id number] PATIENT ID: [name]  MRN: [id number]  DOB/AGE:  November 05, 1940 / 79 y.o.  1 Day Post-Op Procedure(s) (LRB): RIGHT TOTAL KNEE ARTHROPLASTY (Right)    PROGRESS NOTE Subjective: Patient is alert, oriented, no Nausea, no Vomiting, yes passing gas. Taking PO well. Denies SOB, Chest or Calf Pain. Using Incentive Spirometer, PAS in place. Ambulate 80 feet, Patient reports pain as 2/10 .    Objective: Vital signs in last 24 hours: Vitals:   12/03/19 1640 12/03/19 2237 12/04/19 0128 12/04/19 0629  BP: 138/61 (Abnormal) 163/65 121/62 126/62  Pulse: (Abnormal) 57 78 66 62  Resp: 17 16 17 18   Temp: (Abnormal) 97.4 F (36.3 C) (Abnormal) 97.4 F (36.3 C) (Abnormal) 97.5 F (36.4 C) (Abnormal) 97.3 F (36.3 C)  TempSrc:  Oral Oral Oral  SpO2: 100% 97% 94% 93%  Weight:      Height:          Intake/Output from previous day: I/O last 3 completed shifts: In: 2666 [P.O.:320; I.V.:1785; IV Piggyback:561] Out: 4742 [Urine:1650; Blood:100]   Intake/Output this shift: No intake/output data recorded.   LABORATORY DATA: Recent Labs    12/04/19 0401  WBC 19.5*  HGB 13.0  HCT 41.7  PLT 159  NA 138  K 4.8  CL 107  CO2 22  BUN 20  CREATININE 1.89*  GLUCOSE 184*  CALCIUM 8.7*    Examination: Neurologically intact ABD soft Neurovascular intact Sensation intact distally Intact pulses distally Dorsiflexion/Plantar flexion intact Incision: dressing C/D/I No cellulitis present Compartment soft}  Assessment:   1 Day Post-Op Procedure(s) (LRB): RIGHT TOTAL KNEE ARTHROPLASTY (Right) ADDITIONAL DIAGNOSIS: Expected Acute Blood Loss Anemia, Stage III kidney disease, atrial fibrillation, neuropathic pain,Hypertension, history of stroke, history of Guillain-Barr syndrome,  Patient's anticipated LOS is less than 2 midnights, meeting these requirements: - Younger than 31 - Lives within 1 hour of care -  Has a competent adult at home to recover with post-op recover - NO history of  - Chronic pain requiring opiods  - Diabetes  - Coronary Artery Disease  - Heart failure  - Heart attack  - Stroke  - DVT/VTE  - Cardiac arrhythmia  - Respiratory Failure/COPD  - Renal failure  - Anemia  - Advanced Liver disease       Plan: PT/OT WBAT, AROM and PROM  DVT Prophylaxis:  SCDx72hrs, Restarting Eliquis at half dose for the next 2 weeks, restart antihypertensive medications tomorrow DISCHARGE PLAN: Home, Today if he passes physical therapy. DISCHARGE NEEDS: HHPT, Walker and 3-in-1 comode seat     Kerin Salen 12/04/2019, 7:09 AM

## 2019-12-04 NOTE — Discharge Summary (Signed)
Patient ID: [name] MRN: [id number] DOB/AGE: 10/30/1940 79 y.o.  Admit date: 12/03/2019 Discharge date: 12/04/2019  Admission Diagnoses:  Principal Problem:   Osteoarthritis of right knee Active Problems:   Arthritis of right knee   Discharge Diagnoses:  Same  Past Medical History:  Diagnosis Date  . Anemia 10/30/19,11/06/19   iron infusions at [facility]  . Atrial fibrillation ([place]) 02/2016   [place]  . Brain tumor (benign) ([place]) 1998   2 small tumors- since 2015 (Dr. [name] following)  . Carotid artery occlusion   . Cerebrovascular disease September 09, 2001   TIA, Left brain  . CKD (chronic kidney disease), stage III   . Dementia ([place])   . Diverticulosis   . Dysrhythmia    Afib  . ED (erectile dysfunction)   . GERD (gastroesophageal reflux disease)   . Guillain-Barre syndrome ([place]) [id number]  . Hx of elevated lipids   . Hyperlipidemia   . Hypertension   . Memory loss   . Stroke [facility]) [phone]    Surgeries: Procedure(s): RIGHT TOTAL KNEE ARTHROPLASTY on 12/03/2019   Consultants:   Discharged Condition: Improved  Hospital Course: [name] is an 79 y.o. male who was admitted 12/03/2019 for operative treatment ofOsteoarthritis of right knee. Patient has severe unremitting pain that affects sleep, daily activities, and work/hobbies. After pre-op clearance the patient was taken to the operating room on 12/03/2019 and underwent  Procedure(s): RIGHT TOTAL KNEE ARTHROPLASTY.    Patient was given perioperative antibiotics:  Anti-infectives (From admission, onward)   Start     Dose/Rate Route Frequency Ordered Stop   12/03/19 1030  ceFAZolin (ANCEF) IVPB 2g/100 mL premix        2 g 200 mL/hr over 30 Minutes Intravenous On call to O.R. 12/03/19 1027 12/03/19 1243       Patient was given sequential compression devices, early ambulation, and chemoprophylaxis to prevent DVT.  Patient benefited maximally from hospital stay and there were no complications.     Recent vital signs:  Patient Vitals for the past 24 hrs:  BP Temp Temp src Pulse Resp SpO2 Height Weight  12/04/19 0629 126/62 (Abnormal) 97.3 F (36.3 C) Oral 62 18 93 % no documentation no documentation  12/04/19 0128 121/62 (Abnormal) 97.5 F (36.4 C) Oral 66 17 94 % no documentation no documentation  12/03/19 2237 (Abnormal) 163/65 (Abnormal) 97.4 F (36.3 C) Oral 78 16 97 % no documentation no documentation  12/03/19 1640 138/61 (Abnormal) 97.4 F (36.3 C) no documentation (Abnormal) 57 17 100 % no documentation no documentation  12/03/19 1534 (Abnormal) 153/65 98 F (36.7 C) no documentation (Abnormal) 55 16 97 % no documentation no documentation  12/03/19 1515 137/64 97.6 F (36.4 C) no documentation (Abnormal) 54 12 98 % no documentation no documentation  12/03/19 1500 131/61 no documentation no documentation (Abnormal) 52 16 96 % no documentation no documentation  12/03/19 1445 (Abnormal) 142/58 no documentation no documentation (Abnormal) 57 14 96 % no documentation no documentation  12/03/19 1430 (Abnormal) 130/59 no documentation no documentation (Abnormal) 57 20 98 % no documentation no documentation  12/03/19 1415 (Abnormal) 134/55 no documentation no documentation (Abnormal) 58 18 100 % no documentation no documentation  12/03/19 1408 (Abnormal) 135/58 97.6 F (36.4 C) no documentation 60 15 98 % no documentation no documentation  12/03/19 1158 (Abnormal) 153/64 no documentation no documentation (Abnormal) 58 15 100 % no documentation no documentation  12/03/19 1153 (Abnormal) 141/63 no documentation no documentation (Abnormal) 57 18 96 %  no documentation no documentation  12/03/19 1030 no documentation no documentation no documentation no documentation no documentation no documentation 5\' 8"  (1.727 m) 105.2 kg  12/03/19 1027 (Abnormal) 139/94 97.6 F (36.4 C) Oral 74 17 97 % no documentation no documentation     Recent laboratory studies:  Recent Labs     12/04/19 0401  WBC 19.5*  HGB 13.0  HCT 41.7  PLT 159  NA 138  K 4.8  CL 107  CO2 22  BUN 20  CREATININE 1.89*  GLUCOSE 184*  CALCIUM 8.7*     Discharge Medications:   Allergies as of 12/04/2019    Allergen Reactions Comment   Immune Globulins Other (See Comments) Tetanus Immunes Globulins    (  Pt. Cannot take the Flu shot ) Guillian Barre    Influenza Vaccines Other (See Comments) Guillian Barre Guillian Barre   Rho (d) Immune Globulin Other (See Comments) Tetanus Immunes Globulins(Pt. Cannot take the Flu shot ) [name]       Medication List    Take these medications   acetaminophen 500 MG tablet Commonly known as: TYLENOL Take 1,000 mg by mouth every 8 (eight) hours as needed for moderate pain.   apixaban 2.5 MG Tabs tablet Commonly known as: Eliquis Take 1 tablet (2.5 mg total) by mouth 2 (two) times daily. What changed:   medication strength  how much to take   aspirin 81 MG chewable tablet Chew 1 tablet (81 mg total) by mouth daily.   diltiazem 180 MG 24 hr capsule Commonly known as: CARDIZEM CD Take 180 mg by mouth every morning.   lisinopril-hydrochlorothiazide 10-12.5 MG tablet Commonly known as: ZESTORETIC Take 0.5 tablets by mouth daily.   ofloxacin 0.3 % ophthalmic solution Commonly known as: OCUFLOX INSTILL 1 DROP THREE TIMES DAILY IN OPERATIVE EYE STARTING 2 DAYS PRIOR TO SURGERY AND AFTER SURGERY FOR 3 WEEKS   oxyCODONE-acetaminophen 5-325 MG tablet Commonly known as: PERCOCET/ROXICET Take 1 tablet by mouth every 4 (four) hours as needed for severe pain.   pantoprazole 40 MG tablet Commonly known as: PROTONIX Take 40 mg by mouth daily.   rosuvastatin 20 MG tablet Commonly known as: CRESTOR Take 20 mg by mouth every evening.   tiZANidine 2 MG tablet Commonly known as: ZANAFLEX Take 1 tablet (2 mg total) by mouth every 6 (six) hours as needed.   vitamin B-12 1000 MCG tablet Commonly known as: CYANOCOBALAMIN Take 1,000  mcg by mouth 2 (two) times a week. Wednesday and Saturday   Vitamin D 50 MCG (2000 UT) Caps Take 2,000 Units by mouth daily.        Durable Medical Equipment  (From admission, onward)         Start     Ordered   12/03/19 1545  DME Walker rolling  Once       Question:  Patient needs a walker to treat with the following condition  Answer:  Status post right knee replacement   12/03/19 1544   12/03/19 1545  DME 3 n 1  Once        12/03/19 1544           Discharge Care Instructions  (From admission, onward)         Start     Ordered   12/04/19 0000  Change dressing       Comments: Change dressing Only if drainage exceeds 40% of window on dressing   12/04/19 0713  Diagnostic Studies: DG Chest 2 View  Result Date: 11/23/2019 CLINICAL DATA:  Hypertension.  Preoperative right knee surgery EXAM: CHEST - 2 VIEW COMPARISON:  March 06, 2016 FINDINGS: Lungs are clear. Heart size and pulmonary vascularity are normal. No adenopathy. There is aortic atherosclerosis. There are foci of degenerative change in the thoracic spine. IMPRESSION: Lungs are clear.  Heart size normal. Aortic Atherosclerosis (ICD10-I70.0). Electronically Signed   By: Lowella Grip III M.D.   On: 11/23/2019 09:06    Disposition: Discharge disposition: 01-Home or Self Care       Discharge Instructions    Call MD / Call 911   Complete by: As directed    If you experience chest pain or shortness of breath, CALL 911 and be transported to the hospital emergency room.  If you develope a fever above 101 F, pus (white drainage) or increased drainage or redness at the wound, or calf pain, call your surgeon's office.   Change dressing   Complete by: As directed    Change dressing Only if drainage exceeds 40% of window on dressing   Constipation Prevention   Complete by: As directed    Drink plenty of fluids.  Prune juice may be helpful.  You may use a stool softener, such as Colace (over the counter)  100 mg twice a day.  Use MiraLax (over the counter) for constipation as needed.   Diet - low sodium heart healthy   Complete by: As directed    Increase activity slowly as tolerated   Complete by: As directed        Follow-up Information    Frederik Pear, MD In 2 weeks.   Specialty: Orthopedic Surgery Contact information: [place] [address] [phone]                Signed: Kerin Salen 12/04/2019, 7:13 AM

## 2019-12-04 NOTE — Progress Notes (Signed)
Patient being discharged in stable condition. Denies pain or discomfort.

## 2019-12-04 NOTE — Progress Notes (Signed)
Patient PA Randall Hiss notified of question about eliquis dosage/strength. Orders are to take 2.5 mg po 2x day for 2 weeks, then resume pre-surgery dose after 2 weeks. Patient and wife understand and agree.

## 2019-12-04 NOTE — Plan of Care (Signed)
  Problem: Education: Goal: Knowledge of General Education information will improve Description: Including pain rating scale, medication(s)/side effects and non-pharmacologic comfort measures 12/04/2019 1508 by Deetta Perla, RN Outcome: Adequate for Discharge 12/04/2019 1058 by Rotunda Worden, Helane Gunther, RN Outcome: Progressing   Problem: Health Behavior/Discharge Planning: Goal: Ability to manage health-related needs will improve 12/04/2019 1508 by Viera Okonski, Helane Gunther, RN Outcome: Adequate for Discharge 12/04/2019 1058 by Rakeya Glab, Helane Gunther, RN Outcome: Progressing   Problem: Clinical Measurements: Goal: Ability to maintain clinical measurements within normal limits will improve 12/04/2019 1508 by Scarlettrose Costilow, Helane Gunther, RN Outcome: Adequate for Discharge 12/04/2019 1058 by Kawana Hegel, Helane Gunther, RN Outcome: Progressing Goal: Will remain free from infection 12/04/2019 1508 by Deetta Perla, RN Outcome: Adequate for Discharge 12/04/2019 1058 by Jakaylah Schlafer, Helane Gunther, RN Outcome: Progressing Goal: Diagnostic test results will improve 12/04/2019 1508 by Deetta Perla, RN Outcome: Adequate for Discharge 12/04/2019 1058 by Aws Shere, Helane Gunther, RN Outcome: Progressing Goal: Cardiovascular complication will be avoided 12/04/2019 1508 by Deetta Perla, RN Outcome: Adequate for Discharge 12/04/2019 1058 by Sura Canul, Helane Gunther, RN Outcome: Progressing   Problem: Activity: Goal: Risk for activity intolerance will decrease 12/04/2019 1508 by Modesty Rudy, Helane Gunther, RN Outcome: Adequate for Discharge 12/04/2019 1058 by Pragya Lofaso, Helane Gunther, RN Outcome: Progressing   Problem: Nutrition: Goal: Adequate nutrition will be maintained 12/04/2019 1508 by Deetta Perla, RN Outcome: Adequate for Discharge 12/04/2019 1058 by Talea Manges, Helane Gunther, RN Outcome: Progressing   Problem: Coping: Goal: Level of anxiety will decrease 12/04/2019 1508 by Deetta Perla, RN Outcome: Adequate for Discharge 12/04/2019 1058 by  Lailoni Baquera, Helane Gunther, RN Outcome: Progressing   Problem: Elimination: Goal: Will not experience complications related to bowel motility 12/04/2019 1508 by Deetta Perla, RN Outcome: Adequate for Discharge 12/04/2019 1058 by Keila Turan, Helane Gunther, RN Outcome: Progressing Goal: Will not experience complications related to urinary retention 12/04/2019 1508 by Deetta Perla, RN Outcome: Adequate for Discharge 12/04/2019 1058 by Emaleigh Guimond, Helane Gunther, RN Outcome: Progressing   Problem: Pain Managment: Goal: General experience of comfort will improve 12/04/2019 1508 by Deetta Perla, RN Outcome: Adequate for Discharge 12/04/2019 1058 by Walt Geathers, Helane Gunther, RN Outcome: Progressing   Problem: Safety: Goal: Ability to remain free from injury will improve 12/04/2019 1508 by Jerrold Haskell, Helane Gunther, RN Outcome: Adequate for Discharge 12/04/2019 1058 by Maalle Starrett, Helane Gunther, RN Outcome: Progressing   Problem: Skin Integrity: Goal: Risk for impaired skin integrity will decrease 12/04/2019 1508 by Deetta Perla, RN Outcome: Adequate for Discharge 12/04/2019 1058 by Veta Dambrosia, Helane Gunther, RN Outcome: Progressing

## 2019-12-04 NOTE — Plan of Care (Signed)

## 2019-12-04 NOTE — Progress Notes (Signed)
Physical Therapy Treatment Patient Details Name: [name] MRN: [id number] DOB: May 14, 1941 Today's Date: 12/04/2019    History of Present Illness Pt is 79 yo male with hx of R knee arthritis who is s/p R TKA on 12/03/19.  Pt with history including CVA, guillain barre, afib, and HTN.    PT Comments    Pt ambulated in hallway and practiced safe stair technique with spouse.  Pt and spouse report understanding and pt provided with HEP and stair handouts.  Spouse states her son can assist with stairs upon return home as well for safety.  Pt and spouse had no further questions and pt feels ready for d/c home today.    Follow Up Recommendations  Follow surgeon's recommendation for DC plan and follow-up therapies;Supervision/Assistance - 24 hour;Home health PT     Equipment Recommendations  None recommended by PT    Recommendations for Other Services       Precautions / Restrictions Precautions Precautions: Fall;Knee Required Braces or Orthoses: Knee Immobilizer - Right Knee Immobilizer - Right: Discontinue once straight leg raise with < 10 degree lag Restrictions Weight Bearing Restrictions: No RLE Weight Bearing: Weight bearing as tolerated    Mobility  Bed Mobility Overal bed mobility: Needs Assistance Bed Mobility: Supine to Sit     Supine to sit: Min assist     General bed mobility comments: pt in recliner on arrival  Transfers Overall transfer level: Needs assistance Equipment used: Rolling walker (2 wheeled) Transfers: Sit to/from Stand Sit to Stand: Min guard         General transfer comment: verbal cues for UE and LE positioning  Ambulation/Gait Ambulation/Gait assistance: Min guard Gait Distance (Feet): 160 Feet Assistive device: Rolling walker (2 wheeled) Gait Pattern/deviations: Step-to pattern;Decreased weight shift to right;Decreased stance time - right;Step-through pattern Gait velocity: decreased   General Gait Details: verbal cues for  sequence, RW positioning, posture   Stairs Stairs: Yes Stairs assistance: Min guard Stair Management: Step to pattern;With walker;Backwards Number of Stairs: 3 General stair comments: pt performed both one rail and with RW; cues for sequence and safety; first spouse performed step to with right rail however does not sound like he has true rail at home so also practiced backwards with RW and spouse holding RW; both pt and spouse report understanding   Wheelchair Mobility    Modified Rankin (Stroke Patients Only)       Balance                                            Cognition Arousal/Alertness: Awake/alert Behavior During Therapy: WFL for tasks assessed/performed Overall Cognitive Status: Within Functional Limits for tasks assessed                                        Exercises      General Comments        Pertinent Vitals/Pain Pain Assessment: No/denies pain    Home Living                      Prior Function            PT Goals (current goals can now be found in the care plan section) Progress towards PT goals: Progressing toward goals  Frequency    7X/week      PT Plan Current plan remains appropriate    Co-evaluation              AM-PAC PT "6 Clicks" Mobility   Outcome Measure  Help needed turning from your back to your side while in a flat bed without using bedrails?: A Little Help needed moving from lying on your back to sitting on the side of a flat bed without using bedrails?: A Little Help needed moving to and from a bed to a chair (including a wheelchair)?: A Little Help needed standing up from a chair using your arms (e.g., wheelchair or bedside chair)?: A Little Help needed to walk in hospital room?: A Little Help needed climbing 3-5 steps with a railing? : A Little 6 Click Score: 18    End of Session Equipment Utilized During Treatment: Gait belt Activity Tolerance: Patient  tolerated treatment well Patient left: in chair;with call bell/phone within reach;with chair alarm set;with family/visitor present Nurse Communication: Mobility status PT Visit Diagnosis: Other abnormalities of gait and mobility (R26.89);Muscle weakness (generalized) (M62.81)     Time: 4496-7591 PT Time Calculation (min) (ACUTE ONLY): 23 min  Charges:  $Gait Training: 23-37 mins                    Jannette Spanner PT, DPT Acute Rehabilitation Services Pager: [phone] Office: [phone]   York Ram E 12/04/2019, 4:05 PM

## 2019-12-05 ENCOUNTER — Encounter (HOSPITAL_COMMUNITY): Payer: Self-pay | Admitting: Orthopedic Surgery

## 2019-12-05 DIAGNOSIS — I480 Paroxysmal atrial fibrillation: Secondary | ICD-10-CM | POA: Diagnosis not present

## 2019-12-05 DIAGNOSIS — Z8669 Personal history of other diseases of the nervous system and sense organs: Secondary | ICD-10-CM | POA: Diagnosis not present

## 2019-12-05 DIAGNOSIS — Z86011 Personal history of benign neoplasm of the brain: Secondary | ICD-10-CM | POA: Diagnosis not present

## 2019-12-05 DIAGNOSIS — E785 Hyperlipidemia, unspecified: Secondary | ICD-10-CM | POA: Diagnosis not present

## 2019-12-05 DIAGNOSIS — I6521 Occlusion and stenosis of right carotid artery: Secondary | ICD-10-CM | POA: Diagnosis not present

## 2019-12-05 DIAGNOSIS — K5790 Diverticulosis of intestine, part unspecified, without perforation or abscess without bleeding: Secondary | ICD-10-CM | POA: Diagnosis not present

## 2019-12-05 DIAGNOSIS — I129 Hypertensive chronic kidney disease with stage 1 through stage 4 chronic kidney disease, or unspecified chronic kidney disease: Secondary | ICD-10-CM | POA: Diagnosis not present

## 2019-12-05 DIAGNOSIS — Z7901 Long term (current) use of anticoagulants: Secondary | ICD-10-CM | POA: Diagnosis not present

## 2019-12-05 DIAGNOSIS — Z85841 Personal history of malignant neoplasm of brain: Secondary | ICD-10-CM | POA: Diagnosis not present

## 2019-12-05 DIAGNOSIS — N529 Male erectile dysfunction, unspecified: Secondary | ICD-10-CM | POA: Diagnosis not present

## 2019-12-05 DIAGNOSIS — Z87440 Personal history of urinary (tract) infections: Secondary | ICD-10-CM | POA: Diagnosis not present

## 2019-12-05 DIAGNOSIS — Z471 Aftercare following joint replacement surgery: Secondary | ICD-10-CM | POA: Diagnosis not present

## 2019-12-05 DIAGNOSIS — N183 Chronic kidney disease, stage 3 unspecified: Secondary | ICD-10-CM | POA: Diagnosis not present

## 2019-12-05 DIAGNOSIS — D631 Anemia in chronic kidney disease: Secondary | ICD-10-CM | POA: Diagnosis not present

## 2019-12-05 DIAGNOSIS — Z96651 Presence of right artificial knee joint: Secondary | ICD-10-CM | POA: Diagnosis not present

## 2019-12-05 DIAGNOSIS — F0390 Unspecified dementia without behavioral disturbance: Secondary | ICD-10-CM | POA: Diagnosis not present

## 2019-12-05 DIAGNOSIS — Z8673 Personal history of transient ischemic attack (TIA), and cerebral infarction without residual deficits: Secondary | ICD-10-CM | POA: Diagnosis not present

## 2019-12-05 DIAGNOSIS — Z7982 Long term (current) use of aspirin: Secondary | ICD-10-CM | POA: Diagnosis not present

## 2019-12-05 DIAGNOSIS — K219 Gastro-esophageal reflux disease without esophagitis: Secondary | ICD-10-CM | POA: Diagnosis not present

## 2019-12-10 ENCOUNTER — Ambulatory Visit: Payer: PPO | Admitting: Podiatry

## 2019-12-13 DIAGNOSIS — Z96651 Presence of right artificial knee joint: Secondary | ICD-10-CM | POA: Diagnosis not present

## 2019-12-14 DIAGNOSIS — I6350 Cerebral infarction due to unspecified occlusion or stenosis of unspecified cerebral artery: Secondary | ICD-10-CM | POA: Diagnosis not present

## 2019-12-14 DIAGNOSIS — I639 Cerebral infarction, unspecified: Secondary | ICD-10-CM | POA: Diagnosis not present

## 2019-12-14 DIAGNOSIS — N400 Enlarged prostate without lower urinary tract symptoms: Secondary | ICD-10-CM | POA: Diagnosis not present

## 2019-12-14 DIAGNOSIS — I129 Hypertensive chronic kidney disease with stage 1 through stage 4 chronic kidney disease, or unspecified chronic kidney disease: Secondary | ICD-10-CM | POA: Diagnosis not present

## 2019-12-14 DIAGNOSIS — E782 Mixed hyperlipidemia: Secondary | ICD-10-CM | POA: Diagnosis not present

## 2019-12-14 DIAGNOSIS — I480 Paroxysmal atrial fibrillation: Secondary | ICD-10-CM | POA: Diagnosis not present

## 2019-12-14 DIAGNOSIS — D509 Iron deficiency anemia, unspecified: Secondary | ICD-10-CM | POA: Diagnosis not present

## 2019-12-14 DIAGNOSIS — M1711 Unilateral primary osteoarthritis, right knee: Secondary | ICD-10-CM | POA: Diagnosis not present

## 2019-12-14 DIAGNOSIS — N1832 Chronic kidney disease, stage 3b: Secondary | ICD-10-CM | POA: Diagnosis not present

## 2019-12-17 DIAGNOSIS — Z86011 Personal history of benign neoplasm of the brain: Secondary | ICD-10-CM | POA: Diagnosis not present

## 2019-12-17 DIAGNOSIS — I480 Paroxysmal atrial fibrillation: Secondary | ICD-10-CM | POA: Diagnosis not present

## 2019-12-17 DIAGNOSIS — D631 Anemia in chronic kidney disease: Secondary | ICD-10-CM | POA: Diagnosis not present

## 2019-12-17 DIAGNOSIS — Z8669 Personal history of other diseases of the nervous system and sense organs: Secondary | ICD-10-CM | POA: Diagnosis not present

## 2019-12-17 DIAGNOSIS — I129 Hypertensive chronic kidney disease with stage 1 through stage 4 chronic kidney disease, or unspecified chronic kidney disease: Secondary | ICD-10-CM | POA: Diagnosis not present

## 2019-12-17 DIAGNOSIS — Z87440 Personal history of urinary (tract) infections: Secondary | ICD-10-CM | POA: Diagnosis not present

## 2019-12-17 DIAGNOSIS — Z7982 Long term (current) use of aspirin: Secondary | ICD-10-CM | POA: Diagnosis not present

## 2019-12-17 DIAGNOSIS — I6521 Occlusion and stenosis of right carotid artery: Secondary | ICD-10-CM | POA: Diagnosis not present

## 2019-12-17 DIAGNOSIS — E785 Hyperlipidemia, unspecified: Secondary | ICD-10-CM | POA: Diagnosis not present

## 2019-12-17 DIAGNOSIS — Z7901 Long term (current) use of anticoagulants: Secondary | ICD-10-CM | POA: Diagnosis not present

## 2019-12-17 DIAGNOSIS — F0390 Unspecified dementia without behavioral disturbance: Secondary | ICD-10-CM | POA: Diagnosis not present

## 2019-12-17 DIAGNOSIS — N529 Male erectile dysfunction, unspecified: Secondary | ICD-10-CM | POA: Diagnosis not present

## 2019-12-17 DIAGNOSIS — Z85841 Personal history of malignant neoplasm of brain: Secondary | ICD-10-CM | POA: Diagnosis not present

## 2019-12-17 DIAGNOSIS — K5790 Diverticulosis of intestine, part unspecified, without perforation or abscess without bleeding: Secondary | ICD-10-CM | POA: Diagnosis not present

## 2019-12-17 DIAGNOSIS — Z471 Aftercare following joint replacement surgery: Secondary | ICD-10-CM | POA: Diagnosis not present

## 2019-12-17 DIAGNOSIS — Z8673 Personal history of transient ischemic attack (TIA), and cerebral infarction without residual deficits: Secondary | ICD-10-CM | POA: Diagnosis not present

## 2019-12-17 DIAGNOSIS — N183 Chronic kidney disease, stage 3 unspecified: Secondary | ICD-10-CM | POA: Diagnosis not present

## 2019-12-17 DIAGNOSIS — K219 Gastro-esophageal reflux disease without esophagitis: Secondary | ICD-10-CM | POA: Diagnosis not present

## 2019-12-17 DIAGNOSIS — Z96651 Presence of right artificial knee joint: Secondary | ICD-10-CM | POA: Diagnosis not present

## 2019-12-20 DIAGNOSIS — M25661 Stiffness of right knee, not elsewhere classified: Secondary | ICD-10-CM | POA: Diagnosis not present

## 2019-12-20 DIAGNOSIS — Z96651 Presence of right artificial knee joint: Secondary | ICD-10-CM | POA: Diagnosis not present

## 2019-12-24 DIAGNOSIS — M25661 Stiffness of right knee, not elsewhere classified: Secondary | ICD-10-CM | POA: Diagnosis not present

## 2019-12-24 DIAGNOSIS — Z96651 Presence of right artificial knee joint: Secondary | ICD-10-CM | POA: Diagnosis not present

## 2019-12-26 DIAGNOSIS — Z96651 Presence of right artificial knee joint: Secondary | ICD-10-CM | POA: Diagnosis not present

## 2019-12-26 DIAGNOSIS — M25661 Stiffness of right knee, not elsewhere classified: Secondary | ICD-10-CM | POA: Diagnosis not present

## 2019-12-28 DIAGNOSIS — Z96651 Presence of right artificial knee joint: Secondary | ICD-10-CM | POA: Diagnosis not present

## 2019-12-28 DIAGNOSIS — M25661 Stiffness of right knee, not elsewhere classified: Secondary | ICD-10-CM | POA: Diagnosis not present

## 2019-12-31 DIAGNOSIS — Z96651 Presence of right artificial knee joint: Secondary | ICD-10-CM | POA: Diagnosis not present

## 2019-12-31 DIAGNOSIS — M25661 Stiffness of right knee, not elsewhere classified: Secondary | ICD-10-CM | POA: Diagnosis not present

## 2020-01-02 DIAGNOSIS — Z96651 Presence of right artificial knee joint: Secondary | ICD-10-CM | POA: Diagnosis not present

## 2020-01-02 DIAGNOSIS — M25661 Stiffness of right knee, not elsewhere classified: Secondary | ICD-10-CM | POA: Diagnosis not present

## 2020-01-03 DIAGNOSIS — I639 Cerebral infarction, unspecified: Secondary | ICD-10-CM | POA: Diagnosis not present

## 2020-01-03 DIAGNOSIS — I129 Hypertensive chronic kidney disease with stage 1 through stage 4 chronic kidney disease, or unspecified chronic kidney disease: Secondary | ICD-10-CM | POA: Diagnosis not present

## 2020-01-03 DIAGNOSIS — M1711 Unilateral primary osteoarthritis, right knee: Secondary | ICD-10-CM | POA: Diagnosis not present

## 2020-01-03 DIAGNOSIS — N400 Enlarged prostate without lower urinary tract symptoms: Secondary | ICD-10-CM | POA: Diagnosis not present

## 2020-01-03 DIAGNOSIS — I6350 Cerebral infarction due to unspecified occlusion or stenosis of unspecified cerebral artery: Secondary | ICD-10-CM | POA: Diagnosis not present

## 2020-01-03 DIAGNOSIS — E782 Mixed hyperlipidemia: Secondary | ICD-10-CM | POA: Diagnosis not present

## 2020-01-03 DIAGNOSIS — N1832 Chronic kidney disease, stage 3b: Secondary | ICD-10-CM | POA: Diagnosis not present

## 2020-01-03 DIAGNOSIS — I480 Paroxysmal atrial fibrillation: Secondary | ICD-10-CM | POA: Diagnosis not present

## 2020-01-03 DIAGNOSIS — D509 Iron deficiency anemia, unspecified: Secondary | ICD-10-CM | POA: Diagnosis not present

## 2020-01-04 DIAGNOSIS — Z96651 Presence of right artificial knee joint: Secondary | ICD-10-CM | POA: Diagnosis not present

## 2020-01-04 DIAGNOSIS — M25661 Stiffness of right knee, not elsewhere classified: Secondary | ICD-10-CM | POA: Diagnosis not present

## 2020-01-08 DIAGNOSIS — M25661 Stiffness of right knee, not elsewhere classified: Secondary | ICD-10-CM | POA: Diagnosis not present

## 2020-01-08 DIAGNOSIS — Z96651 Presence of right artificial knee joint: Secondary | ICD-10-CM | POA: Diagnosis not present

## 2020-01-11 DIAGNOSIS — Z96651 Presence of right artificial knee joint: Secondary | ICD-10-CM | POA: Diagnosis not present

## 2020-01-11 DIAGNOSIS — M25661 Stiffness of right knee, not elsewhere classified: Secondary | ICD-10-CM | POA: Diagnosis not present

## 2020-01-15 DIAGNOSIS — Z9889 Other specified postprocedural states: Secondary | ICD-10-CM | POA: Diagnosis not present

## 2020-01-15 DIAGNOSIS — Z96651 Presence of right artificial knee joint: Secondary | ICD-10-CM | POA: Diagnosis not present

## 2020-01-15 DIAGNOSIS — N3000 Acute cystitis without hematuria: Secondary | ICD-10-CM | POA: Diagnosis not present

## 2020-01-15 DIAGNOSIS — M25461 Effusion, right knee: Secondary | ICD-10-CM | POA: Diagnosis not present

## 2020-01-15 DIAGNOSIS — R509 Fever, unspecified: Secondary | ICD-10-CM | POA: Diagnosis not present

## 2020-01-21 NOTE — Progress Notes (Signed)
Cardiology Office Note Date:  01/21/2020  Patient ID:  [name], [name] 1940-10-13, MRN [id number] PCP:  Lavone Orn, MD  Electrophysiologist:  Dr. Curt Bears     Chief Complaint:  6 mo EP follow up  History of Present Illness: [name] is a 79 y.o. male with history of HTN, HLD, strokes/TIA, Guillain-Barre syndrome, CKD (III), multiple meningiomass/p radiation 2019, benign brain tumor resection in 2015  PVD with known occl R ICA (follows w/Dr. Donnetta Hutching), and AFib.  He comes in today to be seen for Dr. Curt Bears, last seen by him Sept 2020, at that time, no symptoms of AFib, doing well (recovering from an arm wound after a tree branch fell on it)  I saw him 07/23/2019 He comes in accompanied by his wife, given his neurological history remains with some memory deficits.  He has terrible knee pain that markedly limits what he can do physically, he denies any CP, palpitations or cardiac awareness of any kind.  NO SOB or DOE, no symptoms of PND or orthopnea.  No dizziness, near syncope or syncope He denies any bleeding or signs of bleeding. He follows with Dr. Ether Griffins for his vascular/carotid disease, as well as Dr. [name] and the cancer center for his meningiomas.  (last saw the oncologist Dr. Mickeal Skinner in may last year , he was on both ASA and Eliquis then) No changes were made, he reported recent labs done via his PMD.  He underwent right total knee arthroplasty 11/30/2019, discharged 12/04/2019  TODAY He is accompanied b his wife. He is doing well.  Completed his PT for his knee.   He denies any CP, palpitations or cardiac awareness, does not think he has had any AFib. No dizzy spells, near syncope or syncope No bleeding or signs of bleeding  His wife mentions that they reduced his eliquis to 2.5mg  BID for 2 weeks after his knee surgery and has been back in 5mg  BID nw a couple weeks.   Afib Hx: Diagnosed Dec 2019 A/c initially with warfarin >> CVA  >>Eliquis AAD hx None to  date  Past Medical History:  Diagnosis Date   Anemia 10/30/19,11/06/19   iron infusions at [facility]   Atrial fibrillation ([place]) 02/2016   NEW ONSET   Brain tumor (benign) ([place]) 1998   2 small tumors- since 2015 (Dr. [name] following)   Carotid artery occlusion    Cerebrovascular disease September 09, 2001   TIA, Left brain   CKD (chronic kidney disease), stage III    Dementia ([place])    Diverticulosis    Dysrhythmia    Afib   ED (erectile dysfunction)    GERD (gastroesophageal reflux disease)    Guillain-Barre syndrome ([place]) [id number]   Hx of elevated lipids    Hyperlipidemia    Hypertension    Memory loss    Stroke [facility]) [id number]    Past Surgical History:  Procedure Laterality Date   BRAIN SURGERY  1998   Benign brain tumor removed, Left hemisphere- ? Meningioma   CAROTID ENDARTERECTOMY Left September 13, 2001   LEFT cea   CHOLECYSTECTOMY  Nov. 2001   COLON SURGERY  Feb. 2003   Colonic polyps removed endoscopically   COLONOSCOPY WITH PROPOFOL N/A 11/11/2015   Procedure: COLONOSCOPY WITH PROPOFOL;  Surgeon: Garlan Fair, MD;  [facility];  Service: Endoscopy;  Laterality: N/A;   INGUINAL HERNIA REPAIR  1970's   TOTAL KNEE ARTHROPLASTY Right 12/03/2019   Procedure: RIGHT TOTAL KNEE ARTHROPLASTY;  Surgeon: Mayer Camel,  Pilar Plate, MD;  Location: [facility];  Service: Orthopedics;  Laterality: Right;    Current Outpatient Medications  Medication Sig Dispense Refill   acetaminophen (TYLENOL) 500 MG tablet Take 1,000 mg by mouth every 8 (eight) hours as needed for moderate pain.     apixaban (ELIQUIS) 2.5 MG TABS tablet Take 1 tablet (2.5 mg total) by mouth 2 (two) times daily. 60 tablet 0   aspirin 81 MG chewable tablet Chew 1 tablet (81 mg total) by mouth daily. 30 tablet 0   Cholecalciferol (VITAMIN D) 50 MCG (2000 UT) CAPS Take 2,000 Units by mouth daily.      diltiazem (CARDIZEM CD) 180 MG 24 hr capsule Take 180 mg by mouth every morning.      lisinopril-hydrochlorothiazide (PRINZIDE,ZESTORETIC) 10-12.5 MG tablet Take 0.5 tablets by mouth daily.      ofloxacin (OCUFLOX) 0.3 % ophthalmic solution INSTILL 1 DROP THREE TIMES DAILY IN OPERATIVE EYE STARTING 2 DAYS PRIOR TO SURGERY AND AFTER SURGERY FOR 3 WEEKS (Patient not taking: Reported on 11/12/2019)     oxyCODONE-acetaminophen (PERCOCET/ROXICET) 5-325 MG tablet Take 1 tablet by mouth every 4 (four) hours as needed for severe pain. 30 tablet 0   pantoprazole (PROTONIX) 40 MG tablet Take 40 mg by mouth daily.     rosuvastatin (CRESTOR) 20 MG tablet Take 20 mg by mouth every evening.      tiZANidine (ZANAFLEX) 2 MG tablet Take 1 tablet (2 mg total) by mouth every 6 (six) hours as needed. 60 tablet 0   vitamin B-12 (CYANOCOBALAMIN) 1000 MCG tablet Take 1,000 mcg by mouth 2 (two) times a week. Wednesday and Saturday     No current facility-administered medications for this visit.    Allergies:   Immune globulins, Influenza vaccines, and Rho (d) immune globulin   Social History:  The patient  reports that he quit smoking about 23 years ago. His smoking use included cigarettes. He has never used smokeless tobacco. He reports that he does not drink alcohol and does not use drugs.   Family History:  The patient's family history includes CVA in his father; Colon cancer in his mother; Diabetes in his father; Stroke in his father.  ROS:  Please see the history of present illness.  All other systems are reviewed and otherwise negative.   PHYSICAL EXAM:  VS:  There were no vitals taken for this visit. BMI: There is no height or weight on file to calculate BMI.  Recheck of hi s BP 122/56 Well nourished, well developed, in no acute distress  HEENT: normocephalic, atraumatic  Neck: no JVD, carotid bruits or masses Cardiac:  irregular; no significant murmurs, no rubs, or gallops Lungs:  CTA b/l, no wheezing, rhonchi or rales  Abd: soft, nontender, obese MS: no deformity or atrophy Ext:  trace edema left, 1+ edema R  Skin: warm and dry, no rash Neuro:  No gross deficits appreciated Psych: euthymic mood, full affect    EKG:  EKG done noting irregular rhythm on exam, is SR 79bpm, PACs  05/05/2018 TTE Study Conclusions  - Left ventricle: The cavity size was normal. Wall thickness was  increased in a pattern of mild LVH. Systolic function was normal.  The estimated ejection fraction was in the range of 50% to 55%.  Wall motion was normal; there were no regional wall motion  abnormalities. Doppler parameters are consistent with abnormal  left ventricular relaxation (grade 1 diastolic dysfunction).  - Mitral valve: Calcified annulus.   Impressions:  - Normal  LV systolic function; mild LVH; mild diastolic  dysfunction.    Recent Labs: 12/04/2019: BUN 20; Creatinine, Ser 1.89; Hemoglobin 13.0; Platelets 159; Potassium 4.8; Sodium 138  No results found for requested labs within last 8760 hours.   CrCl cannot be calculated (Patient's most recent lab result is older than the maximum 21 days allowed.).   Wt Readings from Last 3 Encounters:  12/03/19 232 lb (105.2 kg)  11/22/19 232 lb (105.2 kg)  11/06/19 230 lb (104.3 kg)     Other studies reviewed: Additional studies/records reviewed today include: summarized above  ASSESSMENT AND PLAN:  1. Paroxysmal Afib     CHA2DS2Vasc is 6, on Eliquis,  appropriately dosed by age, weight, he turns 89 next yer     No burden by lack of symptoms, he is in rhythm today by exam     PACs today, has SB in the 50's on prior EKG, no changes today to his dilt   2. HTN     Looks good, no changes  3. Edema     Lings are clear and he denies SOB     His wife states LLE is baseline for him and the R leg has been swollen since his knee surgery slowly going down     He is on Eliquis, denies any cal pain, they have had f/u with PMD and ortho     Has an appt next week with ortho     Disposition: No changes today, will see  him back in 54mo, sooner if needed.   Current medicines are reviewed at length with the patient today.  The patient did not have any concerns regarding medicines.  Venetia Night, PA-C 01/21/2020 4:03 PM     [place] [address] [phone] (office)  [phone] (fax)

## 2020-01-22 DIAGNOSIS — N3000 Acute cystitis without hematuria: Secondary | ICD-10-CM | POA: Diagnosis not present

## 2020-01-24 ENCOUNTER — Ambulatory Visit: Payer: PPO | Admitting: Physician Assistant

## 2020-01-24 ENCOUNTER — Other Ambulatory Visit: Payer: Self-pay

## 2020-01-24 VITALS — BP 122/48 | HR 66 | Ht 68.0 in | Wt 230.0 lb

## 2020-01-24 DIAGNOSIS — I480 Paroxysmal atrial fibrillation: Secondary | ICD-10-CM | POA: Diagnosis not present

## 2020-01-24 DIAGNOSIS — R609 Edema, unspecified: Secondary | ICD-10-CM | POA: Diagnosis not present

## 2020-01-24 DIAGNOSIS — I10 Essential (primary) hypertension: Secondary | ICD-10-CM

## 2020-01-24 NOTE — Patient Instructions (Signed)
Medication Instructions:  Your physician recommends that you continue on your current medications as directed. Please refer to the Current Medication list given to you today.  *If you need a refill on your cardiac medications before your next appointment, please call your pharmacy*   Lab Work: NONE ORDERED  TODAY   If you have labs (blood work) drawn today and your tests are completely normal, you will receive your results only by: . MyChart Message (if you have MyChart) OR . A paper copy in the mail If you have any lab test that is abnormal or we need to change your treatment, we will call you to review the results.   Testing/Procedures: NONE ORDERED  TODAY   Follow-Up: At [facility], you and your health needs are our priority.  As part of our continuing mission to provide you with exceptional heart care, we have created designated Provider Care Teams.  These Care Teams include your primary Cardiologist (physician) and Advanced Practice Providers (APPs -  Physician Assistants and Nurse Practitioners) who all work together to provide you with the care you need, when you need it.  We recommend signing up for the patient portal called "MyChart".  Sign up information is provided on this After Visit Summary.  MyChart is used to connect with patients for Virtual Visits (Telemedicine).  Patients are able to view lab/test results, encounter notes, upcoming appointments, etc.  Non-urgent messages can be sent to your provider as well.   To learn more about what you can do with MyChart, go to [email or web address].    Your next appointment:   6 month(s)  The format for your next appointment:   In Person  Provider:   You may see Will Martin Camnitz, MD or one of the following Advanced Practice Providers on your designated Care Team:    Amber Seiler, NP  Renee Ursuy, PA-C  Michael "Andy" Tillery, PA-C    Other Instructions   

## 2020-01-29 DIAGNOSIS — Z9889 Other specified postprocedural states: Secondary | ICD-10-CM | POA: Diagnosis not present

## 2020-01-29 DIAGNOSIS — Z96651 Presence of right artificial knee joint: Secondary | ICD-10-CM | POA: Diagnosis not present

## 2020-01-30 DIAGNOSIS — N3000 Acute cystitis without hematuria: Secondary | ICD-10-CM | POA: Diagnosis not present

## 2020-02-08 DIAGNOSIS — N189 Chronic kidney disease, unspecified: Secondary | ICD-10-CM | POA: Diagnosis not present

## 2020-02-08 DIAGNOSIS — N1832 Chronic kidney disease, stage 3b: Secondary | ICD-10-CM | POA: Diagnosis not present

## 2020-02-11 ENCOUNTER — Other Ambulatory Visit: Payer: Self-pay | Admitting: Radiation Therapy

## 2020-02-14 DIAGNOSIS — M1711 Unilateral primary osteoarthritis, right knee: Secondary | ICD-10-CM | POA: Diagnosis not present

## 2020-02-14 DIAGNOSIS — N400 Enlarged prostate without lower urinary tract symptoms: Secondary | ICD-10-CM | POA: Diagnosis not present

## 2020-02-14 DIAGNOSIS — N1832 Chronic kidney disease, stage 3b: Secondary | ICD-10-CM | POA: Diagnosis not present

## 2020-02-14 DIAGNOSIS — I639 Cerebral infarction, unspecified: Secondary | ICD-10-CM | POA: Diagnosis not present

## 2020-02-14 DIAGNOSIS — I480 Paroxysmal atrial fibrillation: Secondary | ICD-10-CM | POA: Diagnosis not present

## 2020-02-14 DIAGNOSIS — N281 Cyst of kidney, acquired: Secondary | ICD-10-CM | POA: Diagnosis not present

## 2020-02-14 DIAGNOSIS — D509 Iron deficiency anemia, unspecified: Secondary | ICD-10-CM | POA: Diagnosis not present

## 2020-02-14 DIAGNOSIS — I739 Peripheral vascular disease, unspecified: Secondary | ICD-10-CM | POA: Diagnosis not present

## 2020-02-14 DIAGNOSIS — E782 Mixed hyperlipidemia: Secondary | ICD-10-CM | POA: Diagnosis not present

## 2020-02-14 DIAGNOSIS — I6350 Cerebral infarction due to unspecified occlusion or stenosis of unspecified cerebral artery: Secondary | ICD-10-CM | POA: Diagnosis not present

## 2020-02-14 DIAGNOSIS — D631 Anemia in chronic kidney disease: Secondary | ICD-10-CM | POA: Diagnosis not present

## 2020-02-14 DIAGNOSIS — I129 Hypertensive chronic kidney disease with stage 1 through stage 4 chronic kidney disease, or unspecified chronic kidney disease: Secondary | ICD-10-CM | POA: Diagnosis not present

## 2020-02-14 DIAGNOSIS — E559 Vitamin D deficiency, unspecified: Secondary | ICD-10-CM | POA: Diagnosis not present

## 2020-02-27 DIAGNOSIS — D509 Iron deficiency anemia, unspecified: Secondary | ICD-10-CM | POA: Diagnosis not present

## 2020-02-27 DIAGNOSIS — I6350 Cerebral infarction due to unspecified occlusion or stenosis of unspecified cerebral artery: Secondary | ICD-10-CM | POA: Diagnosis not present

## 2020-02-27 DIAGNOSIS — I10 Essential (primary) hypertension: Secondary | ICD-10-CM | POA: Diagnosis not present

## 2020-02-27 DIAGNOSIS — E782 Mixed hyperlipidemia: Secondary | ICD-10-CM | POA: Diagnosis not present

## 2020-02-27 DIAGNOSIS — I480 Paroxysmal atrial fibrillation: Secondary | ICD-10-CM | POA: Diagnosis not present

## 2020-02-27 DIAGNOSIS — N400 Enlarged prostate without lower urinary tract symptoms: Secondary | ICD-10-CM | POA: Diagnosis not present

## 2020-02-27 DIAGNOSIS — M1711 Unilateral primary osteoarthritis, right knee: Secondary | ICD-10-CM | POA: Diagnosis not present

## 2020-02-27 DIAGNOSIS — N1832 Chronic kidney disease, stage 3b: Secondary | ICD-10-CM | POA: Diagnosis not present

## 2020-02-27 DIAGNOSIS — I639 Cerebral infarction, unspecified: Secondary | ICD-10-CM | POA: Diagnosis not present

## 2020-02-27 DIAGNOSIS — I129 Hypertensive chronic kidney disease with stage 1 through stage 4 chronic kidney disease, or unspecified chronic kidney disease: Secondary | ICD-10-CM | POA: Diagnosis not present

## 2020-03-11 ENCOUNTER — Ambulatory Visit: Payer: PPO | Admitting: Podiatry

## 2020-03-11 ENCOUNTER — Other Ambulatory Visit: Payer: Self-pay

## 2020-03-11 ENCOUNTER — Encounter: Payer: Self-pay | Admitting: Podiatry

## 2020-03-11 DIAGNOSIS — M79675 Pain in left toe(s): Secondary | ICD-10-CM

## 2020-03-11 DIAGNOSIS — M79674 Pain in right toe(s): Secondary | ICD-10-CM

## 2020-03-11 DIAGNOSIS — B351 Tinea unguium: Secondary | ICD-10-CM | POA: Diagnosis not present

## 2020-03-11 DIAGNOSIS — M2041 Other hammer toe(s) (acquired), right foot: Secondary | ICD-10-CM

## 2020-03-11 DIAGNOSIS — M2042 Other hammer toe(s) (acquired), left foot: Secondary | ICD-10-CM

## 2020-03-11 DIAGNOSIS — G610 Guillain-Barre syndrome: Secondary | ICD-10-CM

## 2020-03-15 NOTE — Progress Notes (Signed)
Subjective: [name] presents today for follow up of painful mycotic nails b/l that are difficult to trim. Pain interferes with ambulation. Aggravating factors include wearing enclosed shoe gear. Pain is relieved with periodic professional debridement.   He has h/o Guillain Barre' Syndrome. He voices no new pedal concerns on today's visit.  Allergies  Allergen Reactions  . Immune Globulins Other (See Comments)    Tetanus Immunes Globulins    (  Pt. Cannot take the Flu shot ) Guillian Barre   . Influenza Vaccines Other (See Comments)    Guillian Barre [name]  . Rho (D) Immune Globulin Other (See Comments)    Tetanus Immunes Globulins(Pt. Cannot take the Flu shot ) Guillian Barre      Objective: There were no vitals filed for this visit.  Pt is a pleasant 79 y.o. year old Caucasian male  in NAD. AAO x 3.   Vascular Examination:  Capillary refill time to digits immediate b/l. Faintly palpable pedal pulses b/l. Pedal hair sparse b/l. Skin temperature gradient within normal limits b/l. No edema noted b/l.  Dermatological Examination: Pedal skin with normal turgor, texture and tone bilaterally. No open wounds bilaterally. No interdigital macerations bilaterally. Toenails 1-5 b/l elongated, discolored, dystrophic, thickened, crumbly with subungual debris and tenderness to dorsal palpation. Incurvated nailplate medial border(s) L hallux.  Nail border hypertrophy mild. There is tenderness to palpation. Sign(s) of infection: no clinical signs of infection noted on examination today..  Musculoskeletal: Normal muscle strength 5/5 to all lower extremity muscle groups bilaterally. No pain crepitus or joint limitation noted with ROM b/l. Hammertoes noted to the 2-5 bilaterally.  Neurological: Protective sensation intact 5/5 intact bilaterally with 10g monofilament b/l. Vibratory sensation intact b/l. Proprioception intact bilaterally.  Assessment: 1. Pain due to  onychomycosis of toenails of both feet   2. Acquired hammertoes of both feet   3. GBS (Guillain Barre syndrome) (HCC)     Plan: -Toenails 1-5 b/l were debrided in length and girth with sterile nail nippers and dremel without iatrogenic bleeding.  -Offending nail border debrided and curretaged L hallux utilizing sterile nail nipper and currette. Border(s) cleansed with alcohol and TAO applied. Patient instructed to apply Neosporin to L hallux once daily for 7 days. -Patient to report any pedal injuries to medical professional immediately. -Patient to continue soft, supportive shoe gear daily. -Patient/POA to call should there be question/concern in the interim.  Return in about 3 months (around 06/11/2020) for 3 month toenail debridement.

## 2020-03-21 DIAGNOSIS — N1832 Chronic kidney disease, stage 3b: Secondary | ICD-10-CM | POA: Diagnosis not present

## 2020-03-21 DIAGNOSIS — R7301 Impaired fasting glucose: Secondary | ICD-10-CM | POA: Diagnosis not present

## 2020-03-21 DIAGNOSIS — I739 Peripheral vascular disease, unspecified: Secondary | ICD-10-CM | POA: Diagnosis not present

## 2020-03-21 DIAGNOSIS — I129 Hypertensive chronic kidney disease with stage 1 through stage 4 chronic kidney disease, or unspecified chronic kidney disease: Secondary | ICD-10-CM | POA: Diagnosis not present

## 2020-03-21 DIAGNOSIS — D509 Iron deficiency anemia, unspecified: Secondary | ICD-10-CM | POA: Diagnosis not present

## 2020-03-21 DIAGNOSIS — D320 Benign neoplasm of cerebral meninges: Secondary | ICD-10-CM | POA: Diagnosis not present

## 2020-03-21 DIAGNOSIS — I779 Disorder of arteries and arterioles, unspecified: Secondary | ICD-10-CM | POA: Diagnosis not present

## 2020-03-21 DIAGNOSIS — Z0000 Encounter for general adult medical examination without abnormal findings: Secondary | ICD-10-CM | POA: Diagnosis not present

## 2020-03-21 DIAGNOSIS — E782 Mixed hyperlipidemia: Secondary | ICD-10-CM | POA: Diagnosis not present

## 2020-03-21 DIAGNOSIS — I480 Paroxysmal atrial fibrillation: Secondary | ICD-10-CM | POA: Diagnosis not present

## 2020-03-21 DIAGNOSIS — M1711 Unilateral primary osteoarthritis, right knee: Secondary | ICD-10-CM | POA: Diagnosis not present

## 2020-04-02 DIAGNOSIS — I6350 Cerebral infarction due to unspecified occlusion or stenosis of unspecified cerebral artery: Secondary | ICD-10-CM | POA: Diagnosis not present

## 2020-04-02 DIAGNOSIS — I129 Hypertensive chronic kidney disease with stage 1 through stage 4 chronic kidney disease, or unspecified chronic kidney disease: Secondary | ICD-10-CM | POA: Diagnosis not present

## 2020-04-02 DIAGNOSIS — M1711 Unilateral primary osteoarthritis, right knee: Secondary | ICD-10-CM | POA: Diagnosis not present

## 2020-04-02 DIAGNOSIS — G8929 Other chronic pain: Secondary | ICD-10-CM | POA: Diagnosis not present

## 2020-04-02 DIAGNOSIS — N1832 Chronic kidney disease, stage 3b: Secondary | ICD-10-CM | POA: Diagnosis not present

## 2020-04-02 DIAGNOSIS — I10 Essential (primary) hypertension: Secondary | ICD-10-CM | POA: Diagnosis not present

## 2020-04-02 DIAGNOSIS — I480 Paroxysmal atrial fibrillation: Secondary | ICD-10-CM | POA: Diagnosis not present

## 2020-04-02 DIAGNOSIS — I639 Cerebral infarction, unspecified: Secondary | ICD-10-CM | POA: Diagnosis not present

## 2020-04-02 DIAGNOSIS — D509 Iron deficiency anemia, unspecified: Secondary | ICD-10-CM | POA: Diagnosis not present

## 2020-04-02 DIAGNOSIS — N400 Enlarged prostate without lower urinary tract symptoms: Secondary | ICD-10-CM | POA: Diagnosis not present

## 2020-04-02 DIAGNOSIS — E782 Mixed hyperlipidemia: Secondary | ICD-10-CM | POA: Diagnosis not present

## 2020-04-04 ENCOUNTER — Ambulatory Visit
Admission: RE | Admit: 2020-04-04 | Discharge: 2020-04-04 | Disposition: A | Discharge status: Home / Self Care | Payer: PPO | Source: Ambulatory Visit | Attending: Internal Medicine | Admitting: Internal Medicine

## 2020-04-04 DIAGNOSIS — G936 Cerebral edema: Secondary | ICD-10-CM | POA: Diagnosis not present

## 2020-04-04 DIAGNOSIS — D320 Benign neoplasm of cerebral meninges: Secondary | ICD-10-CM

## 2020-04-04 DIAGNOSIS — G9389 Other specified disorders of brain: Secondary | ICD-10-CM | POA: Diagnosis not present

## 2020-04-04 DIAGNOSIS — I6521 Occlusion and stenosis of right carotid artery: Secondary | ICD-10-CM | POA: Diagnosis not present

## 2020-04-04 MED ORDER — GADOBENATE DIMEGLUMINE 529 MG/ML IV SOLN
20.0000 mL | Freq: Once | INTRAVENOUS | Status: AC | PRN
  Administered 2020-04-04: 20 mL via INTRAVENOUS

## 2020-04-07 ENCOUNTER — Ambulatory Visit: Payer: PPO | Admitting: Internal Medicine

## 2020-04-07 ENCOUNTER — Other Ambulatory Visit: Payer: Self-pay

## 2020-04-07 ENCOUNTER — Inpatient Hospital Stay: Payer: PPO | Attending: Internal Medicine | Admitting: Internal Medicine

## 2020-04-07 VITALS — BP 127/56 | HR 82 | Temp 97.0°F | Resp 18 | Ht 68.0 in | Wt 230.2 lb

## 2020-04-07 DIAGNOSIS — I6523 Occlusion and stenosis of bilateral carotid arteries: Secondary | ICD-10-CM | POA: Insufficient documentation

## 2020-04-07 DIAGNOSIS — Z923 Personal history of irradiation: Secondary | ICD-10-CM | POA: Diagnosis not present

## 2020-04-07 DIAGNOSIS — N183 Chronic kidney disease, stage 3 unspecified: Secondary | ICD-10-CM | POA: Diagnosis not present

## 2020-04-07 DIAGNOSIS — I4891 Unspecified atrial fibrillation: Secondary | ICD-10-CM | POA: Insufficient documentation

## 2020-04-07 DIAGNOSIS — Z79899 Other long term (current) drug therapy: Secondary | ICD-10-CM | POA: Insufficient documentation

## 2020-04-07 DIAGNOSIS — D320 Benign neoplasm of cerebral meninges: Secondary | ICD-10-CM

## 2020-04-07 DIAGNOSIS — Z7901 Long term (current) use of anticoagulants: Secondary | ICD-10-CM | POA: Insufficient documentation

## 2020-04-07 DIAGNOSIS — I129 Hypertensive chronic kidney disease with stage 1 through stage 4 chronic kidney disease, or unspecified chronic kidney disease: Secondary | ICD-10-CM | POA: Insufficient documentation

## 2020-04-07 NOTE — Progress Notes (Signed)
[facility] [place], [place] [address] [phone]   Interval Evaluation  Date of Service: 04/07/20 Patient Name: [name] Patient MRN: [id number] Patient DOB: [birth year] Provider: Ventura Sellers, MD  Identifying Statement:  [name] is a 79 y.o. male with multifocal meningioma    Oncologic History: March 1998: Left frontal meningioma resection [name]) 12/27/17: Completes fractionated SRS to R parasaggital meningioma  Interval History:  [name] presents today for follow up after recent MRI brain.  No new or progressive neurologic deficits today.  No seizures or headaches.  Remains somewhat active at home, did well with R knee replacement. No apparent decline in cognition or memory.  Medications: Current Outpatient Medications on File Prior to Visit  Medication Sig Dispense Refill  . acetaminophen (TYLENOL) 500 MG tablet Take 1,000 mg by mouth every 8 (eight) hours as needed for moderate pain.    Marland Kitchen aspirin 81 MG chewable tablet Chew 1 tablet (81 mg total) by mouth daily. 30 tablet 0  . Cholecalciferol (VITAMIN D) 50 MCG (2000 UT) CAPS Take 2,000 Units by mouth daily.     Marland Kitchen DILT-XR 180 MG 24 hr capsule Take 180 mg by mouth every morning.    . diltiazem (CARDIZEM CD) 180 MG 24 hr capsule Take 180 mg by mouth every morning.    Marland Kitchen ELIQUIS 5 MG TABS tablet Take 5 mg by mouth 2 (two) times daily.    Marland Kitchen levofloxacin (LEVAQUIN) 500 MG tablet Take 500 mg by mouth daily.    Marland Kitchen lisinopril-hydrochlorothiazide (PRINZIDE,ZESTORETIC) 10-12.5 MG tablet Take 0.5 tablets by mouth daily.     . pantoprazole (PROTONIX) 40 MG tablet Take 40 mg by mouth daily.    . rosuvastatin (CRESTOR) 20 MG tablet Take 20 mg by mouth every evening.     Marland Kitchen tiZANidine (ZANAFLEX) 2 MG tablet Take 1 tablet (2 mg total) by mouth every 6 (six) hours as needed. 60 tablet 0  . vitamin B-12 (CYANOCOBALAMIN) 1000 MCG tablet Take 1,000 mcg by  mouth 2 (two) times a week. Wednesday and Saturday     No current facility-administered medications on file prior to visit.    Allergies:  Allergies  Allergen Reactions  . Immune Globulins Other (See Comments)    Tetanus Immunes Globulins    (  Pt. Cannot take the Flu shot ) Guillian Barre   . Influenza Vaccines Other (See Comments)    Guillian Barre [name]  . Rho (D) Immune Globulin Other (See Comments)    Tetanus Immunes Globulins(Pt. Cannot take the Flu shot ) [name]    Past Medical History:  Past Medical History:  Diagnosis Date  . Anemia 10/30/19,11/06/19   iron infusions at [facility]  . Atrial fibrillation ([place]) 02/2016   NEW ONSET  . Brain tumor (benign) ([place]) 1998   2 small tumors- since 2015 (Dr. Saintclair Johnson following)  . Carotid artery occlusion   . Cerebrovascular disease September 09, 2001   TIA, Left brain  . CKD (chronic kidney disease), stage III ([name])   . Dementia ([place])   . Diverticulosis   . Dysrhythmia    Afib  . ED (erectile dysfunction)   . GERD (gastroesophageal reflux disease)   . Guillain-Barre syndrome ([place]) [id number]  . Hx of elevated lipids   . Hyperlipidemia   . Hypertension   . Memory loss   . Stroke [facility]) [id number]   Past Surgical History:  Past Surgical History:  Procedure Laterality Date  . BRAIN SURGERY  1998   Benign brain tumor removed, Left hemisphere- ? Meningioma  . CAROTID ENDARTERECTOMY Left September 13, 2001   LEFT cea  . CHOLECYSTECTOMY  Nov. 2001  . COLON SURGERY  Feb. 2003   Colonic polyps removed endoscopically  . COLONOSCOPY WITH PROPOFOL N/A 11/11/2015   Procedure: COLONOSCOPY WITH PROPOFOL;  Surgeon: Ross Fair, MD;  [facility];  Service: Endoscopy;  Laterality: N/A;  . INGUINAL HERNIA REPAIR  1970's  . TOTAL KNEE ARTHROPLASTY Right 12/03/2019   Procedure: RIGHT TOTAL KNEE ARTHROPLASTY;  Surgeon: Ross Pear, MD;  [facility];  Service: Orthopedics;  Laterality: Right;   Social History:    Social History   Socioeconomic History  . Marital status: Married    Spouse name: Not on file  . Number of children: Not on file  . Years of education: Not on file  . Highest education level: Not on file  Occupational History  . Not on file  Tobacco Use  . Smoking status: Former Smoker    Types: Cigarettes    Quit date: 05/17/1996    Years since quitting: 23.9  . Smokeless tobacco: Never Used  Vaping Use  . Vaping Use: Never used  Substance and Sexual Activity  . Alcohol use: Never  . Drug use: No  . Sexual activity: Not on file  Other Topics Concern  . Not on file  Social History Narrative  . Not on file   Social Determinants of Health   Financial Resource Strain:   . Difficulty of Paying Living Expenses: Not on file  Food Insecurity:   . Worried About Charity fundraiser in the Last Year: Not on file  . Ran Out of Food in the Last Year: Not on file  Transportation Needs:   . Lack of Transportation (Medical): Not on file  . Lack of Transportation (Non-Medical): Not on file  Physical Activity:   . Days of Exercise per Week: Not on file  . Minutes of Exercise per Session: Not on file  Stress:   . Feeling of Stress : Not on file  Social Connections:   . Frequency of Communication with Friends and Family: Not on file  . Frequency of Social Gatherings with Friends and Family: Not on file  . Attends Religious Services: Not on file  . Active Member of Clubs or Organizations: Not on file  . Attends Archivist Meetings: Not on file  . Marital Status: Not on file  Intimate Partner Violence:   . Fear of Current or Ex-Partner: Not on file  . Emotionally Abused: Not on file  . Physically Abused: Not on file  . Sexually Abused: Not on file   Family History:  Family History  Problem Relation Age of Onset  . Colon cancer Mother        Metastatic   . Stroke Father   . Diabetes Father   . CVA Father     Review of Systems: Constitutional: Denies fevers,  chills or abnormal weight loss Eyes: Denies blurriness of vision Ears, nose, mouth, throat, and face: Denies mucositis or sore throat Respiratory: Denies cough, dyspnea or wheezes Cardiovascular: Denies palpitation, chest discomfort or lower extremity swelling Gastrointestinal:  Denies nausea, constipation, diarrhea GU: Denies dysuria or incontinence Skin: Denies abnormal skin rashes Neurological: Per HPI Musculoskeletal: Denies joint pain, back or neck discomfort. No decrease in ROM Behavioral/Psych: Denies anxiety, disturbance in thought content, and mood instability  Physical Exam:  Vitals:   04/07/20 1032  BP: (!) 127/56  Pulse: 82  Resp: 18  Temp: (!) 97 F (36.1 C)  SpO2: 95%   KPS: 90. General: Alert, cooperative, pleasant, in no acute distress Head: Normal EENT: No conjunctival injection or scleral icterus. Oral mucosa moist Lungs: Resp effort normal Cardiac: Regular rate and rhythm Abdomen: Soft, non-distended abdomen Skin: No rashes cyanosis or petechiae. Extremities: No clubbing or edema  Neurologic Exam: Mental Status: Awake, alert, attentive to examiner. Oriented to self and environment. Language is fluent with intact comprehension.  Cranial Nerves: Visual acuity is grossly normal. Visual fields are full. Extra-ocular movements intact. No ptosis. Face is symmetric, tongue midline. Motor: Tone and bulk are normal. Power is full in both arms and legs.  Sensory: Intact to light touch and temperature Gait: Normal  Labs: I have reviewed the data as listed    Component Value Date/Time   NA 138 12/04/2019 0401   K 4.8 12/04/2019 0401   CL 107 12/04/2019 0401   CO2 22 12/04/2019 0401   GLUCOSE 184 (H) 12/04/2019 0401   BUN 20 12/04/2019 0401   CREATININE 1.89 (H) 12/04/2019 0401   CALCIUM 8.7 (L) 12/04/2019 0401   PROT 6.7 05/04/2018 1007   ALBUMIN 4.0 05/04/2018 1007   AST 23 05/04/2018 1007   ALT 28 05/04/2018 1007   ALKPHOS 80 05/04/2018 1007   BILITOT  1.6 (H) 05/04/2018 1007   GFRNONAA 33 (L) 12/04/2019 0401   GFRAA 38 (L) 12/04/2019 0401   Lab Results  Component Value Date   WBC 19.5 (H) 12/04/2019   NEUTROABS 8.3 (H) 11/22/2019   HGB 13.0 12/04/2019   HCT 41.7 12/04/2019   MCV 91.0 12/04/2019   PLT 159 12/04/2019    Imaging: [place] Clinician Interpretation: I have personally reviewed the CNS images as listed.  My interpretation, in the context of the patient's clinical presentation, is stable disease, left frontal meningioma slightly larger.  Treated R parasaggital mass is stable.  MR BRAIN W WO CONTRAST  Result Date: 04/05/2020 CLINICAL DATA:  79 year old male with multiple intracranial meningiomas. History of surgery and radiation. Restaging. Chronic occlusion of the right ICA. EXAM: MRI HEAD WITHOUT AND WITH CONTRAST TECHNIQUE: Multiplanar, multiecho pulse sequences of the brain and surrounding structures were obtained without and with intravenous contrast. CONTRAST:  5mL MULTIHANCE GADOBENATE DIMEGLUMINE 529 MG/ML IV SOLN COMPARISON:  Brain MRIs 10/12/2019 and earlier. FINDINGS: Brain: Chronic right vertex meningioma which occludes the adjacent superior sagittal sinus has a prominent posterior dural tail. When including the dural tail as seen on series 14, image 12, the lesion encompasses 48 by 25 x 24 mm (AP by transverse by CC), unchanged since May. Regional T2 and FLAIR hyperintense vasogenic edema about the central sulcus is stable (series 11, image 20). Round meningioma arising cephalad from the left anterior clinoid process is 24 x 26 x 19 mm (AP by transverse by CC), stable since May. Regional mass effect and T2/FLAIR hyperintense vasogenic edema are stable. Underlying cavernous sinus remains within normal limits. And that inferior left frontal lobe signal abnormality is inseparable from chronic left hemisphere encephalomalacia and gliosis underlying the craniotomy. Furthermore, there is a small nearby left middle fossa nodular  meningioma measuring 11 mm, stable (series 16, image 62). Stable mild chronic smooth dural thickening and enhancement elsewhere, including underlying the craniotomy. Mild ex vacuo enlargement of the left lateral ventricle is stable. No ventriculomegaly. No midline shift. Basilar cisterns remain normal. No restricted diffusion or evidence of acute  infarction. No new signal abnormality. No acute intracranial hemorrhage or extra-axial fluid collection. Cervicomedullary junction and pituitary are within normal limits. Vascular: Major intracranial vascular flow voids are stable. Chronic occlusion of the right ICA. Prominent right posterior communicating artery. Dominant left vertebral artery. Chronic segmental occlusion of the superior sagittal sinus, with preserved flow elsewhere in the sinus, and the other major dural venous sinuses appear to be enhancing and patent. Skull and upper cervical spine: Visualized bone marrow signal is within normal limits. Stable and negative visible cervical spine. Sinuses/Orbits: Pre chiasmatic left optic nerve remains inseparable from the anterior clinoid meningioma. Postoperative changes to both globes. Otherwise negative orbits, sinuses. Other: Visible internal auditory structures appear normal. No acute scalp soft tissue finding. IMPRESSION: 1. Three intracranial meningiomas are unchanged since May: Right vertex up to 4.8 cm. Left anterior clinoid up to 2.6 cm. Left middle cranial fossa, 1.1 cm. Stable associated mass effect and vasogenic edema. Chronic segmental occlusion of the superior sagittal sinus. 2. Chronic right ICA occlusion and postoperative encephalomalacia in the left hemisphere. 3. No new intracranial abnormality. Electronically Signed   By: Genevie Ann M.D.   On: 04/05/2020 06:15   PENDING OFFICIAL READ 10/12/19   Assessment/Plan 1. Meningioma, cerebral [facility])  Mr. Dudding is clinically stable today.  MRI demonstrates stable findings including previously progressive  left frontal lobe meningioma, untreated.    No other concerning neurologic findings today.  We appreciate the opportunity to participate in the care of [facility].   We ask that Harland German return to clinic in 9 months following next brain MRI, or sooner as needed.  All questions were answered. The patient knows to call the clinic with any problems, questions or concerns. No barriers to learning were detected.  I have spent a total of 30 minutes of face-to-face and non-face-to-face time, excluding clinical staff time, preparing to see patient, ordering tests and/or medications, counseling the patient, and independently interpreting results and communicating results to the patient/family/caregiver    Ventura Sellers, MD Medical Director of Neuro-Oncology [facility] at [place] 04/07/20 10:44 AM

## 2020-04-08 ENCOUNTER — Telehealth: Payer: Self-pay | Admitting: Internal Medicine

## 2020-04-08 NOTE — Telephone Encounter (Signed)
Scheduled per 11/22 los. Called and spoke with pts wife, confirmed 8/22 appt

## 2020-04-21 ENCOUNTER — Other Ambulatory Visit: Payer: Self-pay | Admitting: Radiation Therapy

## 2020-04-25 ENCOUNTER — Other Ambulatory Visit: Payer: Self-pay | Admitting: Cardiology

## 2020-04-25 NOTE — Telephone Encounter (Signed)
Eliquis 5mg  refill request received. Patient is 79 years old, weight-104.4kg, Crea-1.89 on 12/04/2019, Diagnosis-Afib, and last seen by Tommye Standard on 01/24/2020. Dose is appropriate based on dosing criteria. Will send in refill to requested pharmacy.   Pharmacy stated pt picked up last refill in April 05, 2020. Also, please note that pt will turn 80 at the end of June and it Creatinine remains increased, he will qualify for a dose decrease.

## 2020-04-28 DIAGNOSIS — G8929 Other chronic pain: Secondary | ICD-10-CM | POA: Diagnosis not present

## 2020-04-28 DIAGNOSIS — I129 Hypertensive chronic kidney disease with stage 1 through stage 4 chronic kidney disease, or unspecified chronic kidney disease: Secondary | ICD-10-CM | POA: Diagnosis not present

## 2020-04-28 DIAGNOSIS — N1832 Chronic kidney disease, stage 3b: Secondary | ICD-10-CM | POA: Diagnosis not present

## 2020-04-28 DIAGNOSIS — N400 Enlarged prostate without lower urinary tract symptoms: Secondary | ICD-10-CM | POA: Diagnosis not present

## 2020-04-28 DIAGNOSIS — I6350 Cerebral infarction due to unspecified occlusion or stenosis of unspecified cerebral artery: Secondary | ICD-10-CM | POA: Diagnosis not present

## 2020-04-28 DIAGNOSIS — I480 Paroxysmal atrial fibrillation: Secondary | ICD-10-CM | POA: Diagnosis not present

## 2020-04-28 DIAGNOSIS — D509 Iron deficiency anemia, unspecified: Secondary | ICD-10-CM | POA: Diagnosis not present

## 2020-04-28 DIAGNOSIS — I639 Cerebral infarction, unspecified: Secondary | ICD-10-CM | POA: Diagnosis not present

## 2020-04-28 DIAGNOSIS — I10 Essential (primary) hypertension: Secondary | ICD-10-CM | POA: Diagnosis not present

## 2020-04-28 DIAGNOSIS — M1711 Unilateral primary osteoarthritis, right knee: Secondary | ICD-10-CM | POA: Diagnosis not present

## 2020-04-28 DIAGNOSIS — E782 Mixed hyperlipidemia: Secondary | ICD-10-CM | POA: Diagnosis not present

## 2020-05-29 DIAGNOSIS — I480 Paroxysmal atrial fibrillation: Secondary | ICD-10-CM | POA: Diagnosis not present

## 2020-05-29 DIAGNOSIS — I129 Hypertensive chronic kidney disease with stage 1 through stage 4 chronic kidney disease, or unspecified chronic kidney disease: Secondary | ICD-10-CM | POA: Diagnosis not present

## 2020-05-29 DIAGNOSIS — D509 Iron deficiency anemia, unspecified: Secondary | ICD-10-CM | POA: Diagnosis not present

## 2020-05-29 DIAGNOSIS — M1711 Unilateral primary osteoarthritis, right knee: Secondary | ICD-10-CM | POA: Diagnosis not present

## 2020-05-29 DIAGNOSIS — I10 Essential (primary) hypertension: Secondary | ICD-10-CM | POA: Diagnosis not present

## 2020-05-29 DIAGNOSIS — E782 Mixed hyperlipidemia: Secondary | ICD-10-CM | POA: Diagnosis not present

## 2020-05-29 DIAGNOSIS — N400 Enlarged prostate without lower urinary tract symptoms: Secondary | ICD-10-CM | POA: Diagnosis not present

## 2020-05-29 DIAGNOSIS — N1832 Chronic kidney disease, stage 3b: Secondary | ICD-10-CM | POA: Diagnosis not present

## 2020-05-29 DIAGNOSIS — G8929 Other chronic pain: Secondary | ICD-10-CM | POA: Diagnosis not present

## 2020-06-17 ENCOUNTER — Ambulatory Visit: Payer: PPO | Admitting: Podiatry

## 2020-06-17 ENCOUNTER — Other Ambulatory Visit: Payer: Self-pay

## 2020-06-17 DIAGNOSIS — G610 Guillain-Barre syndrome: Secondary | ICD-10-CM

## 2020-06-17 DIAGNOSIS — B351 Tinea unguium: Secondary | ICD-10-CM | POA: Diagnosis not present

## 2020-06-17 DIAGNOSIS — M79675 Pain in left toe(s): Secondary | ICD-10-CM

## 2020-06-17 DIAGNOSIS — M2042 Other hammer toe(s) (acquired), left foot: Secondary | ICD-10-CM

## 2020-06-17 DIAGNOSIS — M79674 Pain in right toe(s): Secondary | ICD-10-CM | POA: Diagnosis not present

## 2020-06-17 DIAGNOSIS — M2041 Other hammer toe(s) (acquired), right foot: Secondary | ICD-10-CM

## 2020-06-17 NOTE — Progress Notes (Signed)
  Subjective: [name] presents today for follow up of painful mycotic nails b/l that are difficult to trim. Pain interferes with ambulation. Aggravating factors include wearing enclosed shoe gear. Pain is relieved with periodic professional debridement.   PCP is Dr. Lavone Orn. Last visit was 03/21/2020.  He has h/o Guillain Barre' Syndrome. He voices no new pedal concerns on today's visit.  Allergies  Allergen Reactions  . Immune Globulins Other (See Comments)    Tetanus Immunes Globulins    (  Pt. Cannot take the Flu shot ) Guillian Barre   . Influenza Vaccines Other (See Comments)    Guillian Barre [name]  . Rho (D) Immune Globulin Other (See Comments)    Tetanus Immunes Globulins(Pt. Cannot take the Flu shot ) Guillian Barre      Objective: There were no vitals filed for this visit.  Pt is a pleasant 80 y.o. year old Caucasian male  in NAD. AAO x 3.   Vascular Examination:  Capillary refill time to digits immediate b/l. Faintly palpable pedal pulses b/l. Pedal hair sparse b/l. Skin temperature gradient within normal limits b/l. No edema noted b/l.  Dermatological Examination: Pedal skin with normal turgor, texture and tone bilaterally. No open wounds bilaterally. No interdigital macerations bilaterally. Toenails 1-5 b/l elongated, discolored, dystrophic, thickened, crumbly with subungual debris and tenderness to dorsal palpation. Incurvated nailplate medial border(s) L hallux.  Nail border hypertrophy mild. There is tenderness to palpation. Sign(s) of infection: no clinical signs of infection noted on examination today..  Musculoskeletal: Normal muscle strength 5/5 to all lower extremity muscle groups bilaterally. No pain crepitus or joint limitation noted with ROM b/l. Hammertoes noted to the 2-5 bilaterally.  Neurological: Protective sensation intact 5/5 intact bilaterally with 10g monofilament b/l. Vibratory sensation intact b/l. Proprioception  intact bilaterally.  Assessment: 1. Pain due to onychomycosis of toenails of both feet   2. Acquired hammertoes of both feet   3. GBS (Guillain Barre syndrome) ([place])     Plan: -Examined patient. -Patient to continue soft, supportive shoe gear daily. -Toenails 1-5 b/l were debrided in length and girth with sterile nail nippers and dremel without iatrogenic bleeding.  -Offending nail border debrided and curretaged L hallux utilizing sterile nail nipper and currette. Border cleansed with alcohol and triple antibiotic applied. No further treatment required by patient/caregiver. -Patient to report any pedal injuries to medical professional immediately. -Patient/POA to call should there be question/concern in the interim.  Return in about 3 months (around 09/14/2020).

## 2020-06-24 DIAGNOSIS — L820 Inflamed seborrheic keratosis: Secondary | ICD-10-CM | POA: Diagnosis not present

## 2020-06-24 DIAGNOSIS — L570 Actinic keratosis: Secondary | ICD-10-CM | POA: Diagnosis not present

## 2020-06-24 DIAGNOSIS — L821 Other seborrheic keratosis: Secondary | ICD-10-CM | POA: Diagnosis not present

## 2020-06-24 DIAGNOSIS — D2271 Melanocytic nevi of right lower limb, including hip: Secondary | ICD-10-CM | POA: Diagnosis not present

## 2020-06-24 DIAGNOSIS — Z85828 Personal history of other malignant neoplasm of skin: Secondary | ICD-10-CM | POA: Diagnosis not present

## 2020-06-24 DIAGNOSIS — L565 Disseminated superficial actinic porokeratosis (DSAP): Secondary | ICD-10-CM | POA: Diagnosis not present

## 2020-06-24 DIAGNOSIS — L814 Other melanin hyperpigmentation: Secondary | ICD-10-CM | POA: Diagnosis not present

## 2020-07-01 DIAGNOSIS — I129 Hypertensive chronic kidney disease with stage 1 through stage 4 chronic kidney disease, or unspecified chronic kidney disease: Secondary | ICD-10-CM | POA: Diagnosis not present

## 2020-07-01 DIAGNOSIS — G8929 Other chronic pain: Secondary | ICD-10-CM | POA: Diagnosis not present

## 2020-07-01 DIAGNOSIS — N1832 Chronic kidney disease, stage 3b: Secondary | ICD-10-CM | POA: Diagnosis not present

## 2020-07-01 DIAGNOSIS — I10 Essential (primary) hypertension: Secondary | ICD-10-CM | POA: Diagnosis not present

## 2020-07-01 DIAGNOSIS — D509 Iron deficiency anemia, unspecified: Secondary | ICD-10-CM | POA: Diagnosis not present

## 2020-07-01 DIAGNOSIS — I480 Paroxysmal atrial fibrillation: Secondary | ICD-10-CM | POA: Diagnosis not present

## 2020-07-01 DIAGNOSIS — E782 Mixed hyperlipidemia: Secondary | ICD-10-CM | POA: Diagnosis not present

## 2020-07-01 DIAGNOSIS — N400 Enlarged prostate without lower urinary tract symptoms: Secondary | ICD-10-CM | POA: Diagnosis not present

## 2020-07-01 DIAGNOSIS — I6350 Cerebral infarction due to unspecified occlusion or stenosis of unspecified cerebral artery: Secondary | ICD-10-CM | POA: Diagnosis not present

## 2020-07-01 DIAGNOSIS — M1711 Unilateral primary osteoarthritis, right knee: Secondary | ICD-10-CM | POA: Diagnosis not present

## 2020-07-08 DIAGNOSIS — N1832 Chronic kidney disease, stage 3b: Secondary | ICD-10-CM | POA: Diagnosis not present

## 2020-07-14 DIAGNOSIS — D631 Anemia in chronic kidney disease: Secondary | ICD-10-CM | POA: Diagnosis not present

## 2020-07-14 DIAGNOSIS — N2581 Secondary hyperparathyroidism of renal origin: Secondary | ICD-10-CM | POA: Diagnosis not present

## 2020-07-14 DIAGNOSIS — N400 Enlarged prostate without lower urinary tract symptoms: Secondary | ICD-10-CM | POA: Diagnosis not present

## 2020-07-14 DIAGNOSIS — I739 Peripheral vascular disease, unspecified: Secondary | ICD-10-CM | POA: Diagnosis not present

## 2020-07-14 DIAGNOSIS — N281 Cyst of kidney, acquired: Secondary | ICD-10-CM | POA: Diagnosis not present

## 2020-07-14 DIAGNOSIS — I129 Hypertensive chronic kidney disease with stage 1 through stage 4 chronic kidney disease, or unspecified chronic kidney disease: Secondary | ICD-10-CM | POA: Diagnosis not present

## 2020-07-14 DIAGNOSIS — N1832 Chronic kidney disease, stage 3b: Secondary | ICD-10-CM | POA: Diagnosis not present

## 2020-08-01 ENCOUNTER — Other Ambulatory Visit: Payer: Self-pay | Admitting: *Deleted

## 2020-08-01 DIAGNOSIS — I480 Paroxysmal atrial fibrillation: Secondary | ICD-10-CM | POA: Diagnosis not present

## 2020-08-01 DIAGNOSIS — D509 Iron deficiency anemia, unspecified: Secondary | ICD-10-CM | POA: Diagnosis not present

## 2020-08-01 DIAGNOSIS — N1832 Chronic kidney disease, stage 3b: Secondary | ICD-10-CM | POA: Diagnosis not present

## 2020-08-01 DIAGNOSIS — I10 Essential (primary) hypertension: Secondary | ICD-10-CM | POA: Diagnosis not present

## 2020-08-01 DIAGNOSIS — E782 Mixed hyperlipidemia: Secondary | ICD-10-CM | POA: Diagnosis not present

## 2020-08-01 DIAGNOSIS — N400 Enlarged prostate without lower urinary tract symptoms: Secondary | ICD-10-CM | POA: Diagnosis not present

## 2020-08-01 DIAGNOSIS — G8929 Other chronic pain: Secondary | ICD-10-CM | POA: Diagnosis not present

## 2020-08-01 DIAGNOSIS — Z9889 Other specified postprocedural states: Secondary | ICD-10-CM

## 2020-08-01 DIAGNOSIS — M1711 Unilateral primary osteoarthritis, right knee: Secondary | ICD-10-CM | POA: Diagnosis not present

## 2020-08-04 ENCOUNTER — Ambulatory Visit (HOSPITAL_COMMUNITY)
Admission: RE | Admit: 2020-08-04 | Discharge: 2020-08-04 | Disposition: A | Discharge status: Home / Self Care | Payer: PPO | Source: Ambulatory Visit | Attending: Surgery | Admitting: Surgery

## 2020-08-04 ENCOUNTER — Ambulatory Visit: Payer: PPO | Admitting: Physician Assistant

## 2020-08-04 ENCOUNTER — Other Ambulatory Visit: Payer: Self-pay

## 2020-08-04 VITALS — BP 135/61 | HR 58 | Temp 98.4°F | Resp 18 | Ht 68.0 in | Wt 236.0 lb

## 2020-08-04 DIAGNOSIS — I6522 Occlusion and stenosis of left carotid artery: Secondary | ICD-10-CM

## 2020-08-04 DIAGNOSIS — H52203 Unspecified astigmatism, bilateral: Secondary | ICD-10-CM | POA: Diagnosis not present

## 2020-08-04 DIAGNOSIS — Z9889 Other specified postprocedural states: Secondary | ICD-10-CM | POA: Diagnosis not present

## 2020-08-04 DIAGNOSIS — H524 Presbyopia: Secondary | ICD-10-CM | POA: Diagnosis not present

## 2020-08-04 DIAGNOSIS — Z961 Presence of intraocular lens: Secondary | ICD-10-CM | POA: Diagnosis not present

## 2020-08-04 NOTE — Progress Notes (Signed)
Office Note     CC:  follow up Requesting Provider:  Lavone Orn, MD  HPI: [name] is a 80 y.o. (04/26/1941) male who presents for routine follow-up for carotid artery disease.  The patient is status post left carotid endarterectomy 2003 or a TIA.  He has known occlusion of the right internal carotid artery.  In 2019, he suffered a left MCA watershed infarct.  He is accompanied today by his wife.  He denies monocular blindness, slurred speech, facial drooping or extremity weakness or numbness.  His wife states he does have some oral weakness with drooling.  His past medical history is significant for benign brain tumor resection and radiation therapy.  He has so has a history of Guillain-Barr syndrome.  He denies lower extremity claudication.  In 2019 it was recommended that he start Plavix or aspirin and he is compliant with an 81 mg aspirin daily.  He is also on Eliquis twice a day.  He is maintained on a statin.  He is not diabetic.  He does not use tobacco products.   Past Medical History:  Diagnosis Date  . Anemia 10/30/19,11/06/19   iron infusions at [facility]  . Atrial fibrillation ([place]) 02/2016   NEW ONSET  . Brain tumor (benign) ([place]) 1998   2 small tumors- since 2015 (Dr. [name] following)  . Carotid artery occlusion   . Cerebrovascular disease September 09, 2001   TIA, Left brain  . CKD (chronic kidney disease), stage III ([place])   . Dementia ([place])   . Diverticulosis   . Dysrhythmia    Afib  . ED (erectile dysfunction)   . GERD (gastroesophageal reflux disease)   . Guillain-Barre syndrome ([place]) [id number]  . Hx of elevated lipids   . Hyperlipidemia   . Hypertension   . Memory loss   . Stroke [facility]) 1962,2297    Past Surgical History:  Procedure Laterality Date  . BRAIN SURGERY  1998   Benign brain tumor removed, Left hemisphere- ? Meningioma  . CAROTID ENDARTERECTOMY Left September 13, 2001   LEFT cea  . CHOLECYSTECTOMY  Nov. 2001  . COLON SURGERY  Feb. 2003    Colonic polyps removed endoscopically  . COLONOSCOPY WITH PROPOFOL N/A 11/11/2015   Procedure: COLONOSCOPY WITH PROPOFOL;  Surgeon: Garlan Fair, MD;  Location: [facility] ENDOSCOPY;  Service: Endoscopy;  Laterality: N/A;  . INGUINAL HERNIA REPAIR  1970's  . TOTAL KNEE ARTHROPLASTY Right 12/03/2019   Procedure: RIGHT TOTAL KNEE ARTHROPLASTY;  Surgeon: Frederik Pear, MD;  Location: [facility] ORS;  Service: Orthopedics;  Laterality: Right;    Social History   Socioeconomic History  . Marital status: Married    Spouse name: Not on file  . Number of children: Not on file  . Years of education: Not on file  . Highest education level: Not on file  Occupational History  . Not on file  Tobacco Use  . Smoking status: Former Smoker    Types: Cigarettes    Quit date: 05/17/1996    Years since quitting: 24.2  . Smokeless tobacco: Never Used  Vaping Use  . Vaping Use: Never used  Substance and Sexual Activity  . Alcohol use: Never  . Drug use: No  . Sexual activity: Not on file  Other Topics Concern  . Not on file  Social History Narrative  . Not on file   Social Determinants of Health   Financial Resource Strain: Not on file  Food Insecurity: Not on file  Transportation Needs:  Not on file  Physical Activity: Not on file  Stress: Not on file  Social Connections: Not on file  Intimate Partner Violence: Not on file   Family History  Problem Relation Age of Onset  . Colon cancer Mother        Metastatic   . Stroke Father   . Diabetes Father   . CVA Father     Current Outpatient Medications  Medication Sig Dispense Refill  . acetaminophen (TYLENOL) 500 MG tablet Take 1,000 mg by mouth every 8 (eight) hours as needed for moderate pain.    Marland Kitchen aspirin 81 MG chewable tablet Chew 1 tablet (81 mg total) by mouth daily. 30 tablet 0  . Cholecalciferol (VITAMIN D) 50 MCG (2000 UT) CAPS Take 2,000 Units by mouth daily.     Marland Kitchen DILT-XR 180 MG 24 hr capsule Take 180 mg by mouth every morning.    .  diltiazem (CARDIZEM CD) 180 MG 24 hr capsule Take 180 mg by mouth every morning.    Marland Kitchen ELIQUIS 5 MG TABS tablet TAKE ONE TABLET BY MOUTH TWICE DAILY 60 tablet 5  . levofloxacin (LEVAQUIN) 500 MG tablet Take 500 mg by mouth daily.    Marland Kitchen lisinopril-hydrochlorothiazide (PRINZIDE,ZESTORETIC) 10-12.5 MG tablet Take 0.5 tablets by mouth daily.     . pantoprazole (PROTONIX) 40 MG tablet Take 40 mg by mouth daily.    . rosuvastatin (CRESTOR) 20 MG tablet Take 20 mg by mouth every evening.     Marland Kitchen tiZANidine (ZANAFLEX) 2 MG tablet Take 1 tablet (2 mg total) by mouth every 6 (six) hours as needed. 60 tablet 0  . vitamin B-12 (CYANOCOBALAMIN) 1000 MCG tablet Take 1,000 mcg by mouth 2 (two) times a week. Wednesday and Saturday     No current facility-administered medications for this visit.    Allergies  Allergen Reactions  . Immune Globulins Other (See Comments)    Tetanus Immunes Globulins    (  Pt. Cannot take the Flu shot ) Guillian Barre   . Influenza Vaccines Other (See Comments)    Guillian Barre [name]  . Rho (D) Immune Globulin Other (See Comments)    Tetanus Immunes Globulins(Pt. Cannot take the Flu shot ) Guillian Barre      REVIEW OF SYSTEMS:   [X]  denotes positive finding, [ ]  denotes negative finding Cardiac  Comments:  Chest pain or chest pressure:    Shortness of breath upon exertion:    Short of breath when lying flat:    Irregular heart rhythm:        Vascular    Pain in calf, thigh, or hip brought on by ambulation:    Pain in feet at night that wakes you up from your sleep:     Blood clot in your veins:    Leg swelling:         Pulmonary    Oxygen at home:    Productive cough:     Wheezing:         Neurologic    Sudden weakness in arms or legs:     Sudden numbness in arms or legs:     Sudden onset of difficulty speaking or slurred speech:    Temporary loss of vision in one eye:     Problems with dizziness:         Gastrointestinal    Blood in  stool:     Vomited blood:         Genitourinary  Burning when urinating:     Blood in urine:        Psychiatric    Major depression:         Hematologic    Bleeding problems:    Problems with blood clotting too easily:        Skin    Rashes or ulcers:        Constitutional    Fever or chills:      PHYSICAL EXAMINATION:  Vitals:   08/04/20 1336  BP: 135/61  Pulse: (!) 58  Resp: 18  Temp: 98.4 F (36.9 C)  TempSrc: Temporal  SpO2: 97%  Weight: 236 lb (107 kg)  Height: 5\' 8"  (1.727 m)    General:  WDWN in NAD; vital signs documented above Gait: Unaided HENT: WNL, normocephalic Pulmonary: normal non-labored breathing , without Rales, rhonchi,  wheezing Cardiac: regular HR, with  Murmurs with carotid bruit Abdomen: soft, NT, no masses Skin: without rashes Vascular Exam/Pulses: 2+ symmetrical bilateral radial pulses Extremities: without ischemic changes, without Gangrene , without cellulitis; without open wounds; warm.  Pedal pulses are not palpable.  The patient has 5 out of 5 bilateral hand grip, triceps and biceps strength Musculoskeletal: no muscle wasting or atrophy  Neurologic: A&O X 3;  No focal weakness or paresthesias are detected Psychiatric:  The pt has Normal affect.   Non-Invasive Vascular Imaging:   08/04/2020 Right Carotid: Evidence consistent with a total occlusion of the right  ICA.   Left Carotid: Velocities in the left ICA are consistent with a 1-39%  stenosis. Hemodynamically significant plaque >50% visualized in the  CCA.   Vertebrals: Bilateral vertebral arteries demonstrate antegrade flow.  Subclavians: Bilateral subclavian arteries were stenotic.     ASSESSMENT/PLAN:: 80 y.o. male here for follow up for carotid artery stenosis.  He is asymptomatic.  He is compliant with recommended medications.  The patient has had noted hemodynamically significant left common carotid artery stenosis and his peak systolic velocity today is 479.  I  reviewed this data with Dr. Trula Slade.  We recommend CTA of the neck to further define this area.  We will make arrangements for follow-up with Dr. Donnetta Hutching in [place] [facility].  Barbie Banner, PA-C [facility] [phone]  Clinic MD:   Trula Slade

## 2020-08-05 ENCOUNTER — Other Ambulatory Visit: Payer: Self-pay

## 2020-08-05 DIAGNOSIS — I6522 Occlusion and stenosis of left carotid artery: Secondary | ICD-10-CM

## 2020-08-26 ENCOUNTER — Ambulatory Visit (HOSPITAL_COMMUNITY)
Admission: RE | Admit: 2020-08-26 | Discharge: 2020-08-26 | Disposition: A | Discharge status: Home / Self Care | Payer: PPO | Source: Ambulatory Visit | Attending: Vascular Surgery | Admitting: Vascular Surgery

## 2020-08-26 DIAGNOSIS — G8929 Other chronic pain: Secondary | ICD-10-CM | POA: Diagnosis not present

## 2020-08-26 DIAGNOSIS — D509 Iron deficiency anemia, unspecified: Secondary | ICD-10-CM | POA: Diagnosis not present

## 2020-08-26 DIAGNOSIS — N400 Enlarged prostate without lower urinary tract symptoms: Secondary | ICD-10-CM | POA: Diagnosis not present

## 2020-08-26 DIAGNOSIS — K219 Gastro-esophageal reflux disease without esophagitis: Secondary | ICD-10-CM | POA: Diagnosis not present

## 2020-08-26 DIAGNOSIS — I700 Atherosclerosis of aorta: Secondary | ICD-10-CM | POA: Diagnosis not present

## 2020-08-26 DIAGNOSIS — I129 Hypertensive chronic kidney disease with stage 1 through stage 4 chronic kidney disease, or unspecified chronic kidney disease: Secondary | ICD-10-CM | POA: Diagnosis not present

## 2020-08-26 DIAGNOSIS — E782 Mixed hyperlipidemia: Secondary | ICD-10-CM | POA: Diagnosis not present

## 2020-08-26 DIAGNOSIS — I639 Cerebral infarction, unspecified: Secondary | ICD-10-CM | POA: Diagnosis not present

## 2020-08-26 DIAGNOSIS — N1832 Chronic kidney disease, stage 3b: Secondary | ICD-10-CM | POA: Diagnosis not present

## 2020-08-26 DIAGNOSIS — I6523 Occlusion and stenosis of bilateral carotid arteries: Secondary | ICD-10-CM | POA: Diagnosis not present

## 2020-08-26 DIAGNOSIS — I10 Essential (primary) hypertension: Secondary | ICD-10-CM | POA: Diagnosis not present

## 2020-08-26 DIAGNOSIS — I480 Paroxysmal atrial fibrillation: Secondary | ICD-10-CM | POA: Diagnosis not present

## 2020-08-26 DIAGNOSIS — I6522 Occlusion and stenosis of left carotid artery: Secondary | ICD-10-CM | POA: Diagnosis not present

## 2020-08-26 DIAGNOSIS — I672 Cerebral atherosclerosis: Secondary | ICD-10-CM | POA: Diagnosis not present

## 2020-08-26 DIAGNOSIS — I708 Atherosclerosis of other arteries: Secondary | ICD-10-CM | POA: Diagnosis not present

## 2020-08-26 DIAGNOSIS — I6350 Cerebral infarction due to unspecified occlusion or stenosis of unspecified cerebral artery: Secondary | ICD-10-CM | POA: Diagnosis not present

## 2020-08-26 LAB — POCT I-STAT CREATININE: Creatinine, Ser: 2 mg/dL — ABNORMAL HIGH (ref 0.61–1.24)

## 2020-08-26 MED ORDER — IOHEXOL 350 MG/ML SOLN: 75.0000 mL | Freq: Once | INTRAVENOUS | Status: DC | PRN

## 2020-08-26 MED ORDER — IOHEXOL 350 MG/ML SOLN
60.0000 mL | Freq: Once | INTRAVENOUS | Status: AC | PRN
  Administered 2020-08-26: 60 mL via INTRAVENOUS

## 2020-09-01 ENCOUNTER — Ambulatory Visit: Payer: PPO | Admitting: Vascular Surgery

## 2020-09-01 ENCOUNTER — Other Ambulatory Visit: Payer: Self-pay

## 2020-09-01 ENCOUNTER — Encounter: Payer: Self-pay | Admitting: Vascular Surgery

## 2020-09-01 VITALS — BP 149/67 | HR 55 | Temp 98.1°F | Resp 18 | Ht 68.0 in | Wt 230.0 lb

## 2020-09-01 DIAGNOSIS — I6522 Occlusion and stenosis of left carotid artery: Secondary | ICD-10-CM

## 2020-09-01 NOTE — H&P (View-Only) (Signed)
[facility] [place]  Patient name: [name] MRN: [id number] DOB: 1941-02-22 Sex: male  REASON FOR VISIT: Discuss CT angiogram neck  HPI: [name] is a 80 y.o. male here today for discussion of recent CT angiogram of his neck.  He is here today with his wife.  He is known to me from prior left carotid endarterectomy in 2003.  He has had known long-term occlusion of right internal carotid artery.  He has been followed with ultrasound.  He did have a left brain stroke in 2019.  At that time CT angiogram revealed widely patent endarterectomy although he did have a 50% weblike stenosis in the proximal common carotid artery.  He has been treated with Eliquis and has had no recurrent stroke symptoms.  He recently was seen in our office with follow-up duplex suggesting high-grade stenosis of the area of concern regarding his common carotid artery and he underwent CT scan for further evaluation of this.  He does have a history of benign brain tumors which are being watched after resection in the past.  He also has history of Guillain-Barr syndrome on 2 occasions  Past Medical History:  Diagnosis Date  . Anemia 10/30/19,11/06/19   [facility] infusions at [facility]  . Atrial fibrillation ([place]) 02/2016   NEW ONSET  . Brain tumor (benign) ([place]) 1998   2 small tumors- since 2015 (Dr. [name] following)  . Carotid artery occlusion   . Cerebrovascular disease September 09, 2001   TIA, Left brain  . CKD (chronic kidney disease), stage III ([place])   . Dementia ([place])   . Diverticulosis   . Dysrhythmia    Afib  . ED (erectile dysfunction)   . GERD (gastroesophageal reflux disease)   . Guillain-Barre syndrome ([place]) [id number]  . Hx of elevated lipids   . Hyperlipidemia   . Hypertension   . Memory loss   . Stroke [facility]) 2003,2019    Family History  Problem Relation Age of Onset  . Colon cancer Mother        Metastatic   . Stroke Father   .  Diabetes Father   . CVA Father     SOCIAL HISTORY: Social History   Tobacco Use  . Smoking status: Former Smoker    Types: Cigarettes    Quit date: 05/17/1996    Years since quitting: 24.3  . Smokeless tobacco: Never Used  Substance Use Topics  . Alcohol use: Never    Allergies  Allergen Reactions  . Immune Globulins Other (See Comments)    Tetanus Immunes Globulins    (  Pt. Cannot take the Flu shot ) Guillian Barre   . Influenza Vaccines Other (See Comments)    Guillian Barre [name]  . Rho (D) Immune Globulin Other (See Comments)    Tetanus Immunes Globulins(Pt. Cannot take the Flu shot ) [name]     Current Outpatient Medications  Medication Sig Dispense Refill  . acetaminophen (TYLENOL) 500 MG tablet Take 1,000 mg by mouth every 8 (eight) hours as needed for moderate pain.    Marland Kitchen aspirin 81 MG chewable tablet Chew 1 tablet (81 mg total) by mouth daily. 30 tablet 0  . Cholecalciferol (VITAMIN D) 50 MCG (2000 UT) CAPS Take 2,000 Units by mouth daily.     Marland Kitchen DILT-XR 180 MG 24 hr capsule Take 180 mg by mouth every morning.    . diltiazem (CARDIZEM CD) 180 MG 24 hr capsule Take 180 mg by  mouth every morning.    Marland Kitchen ELIQUIS 5 MG TABS tablet TAKE ONE TABLET BY MOUTH TWICE DAILY 60 tablet 5  . levofloxacin (LEVAQUIN) 500 MG tablet Take 500 mg by mouth daily.    Marland Kitchen lisinopril-hydrochlorothiazide (PRINZIDE,ZESTORETIC) 10-12.5 MG tablet Take 0.5 tablets by mouth daily.     . pantoprazole (PROTONIX) 40 MG tablet Take 40 mg by mouth daily.    . rosuvastatin (CRESTOR) 20 MG tablet Take 20 mg by mouth every evening.     Marland Kitchen tiZANidine (ZANAFLEX) 2 MG tablet Take 1 tablet (2 mg total) by mouth every 6 (six) hours as needed. 60 tablet 0  . vitamin B-12 (CYANOCOBALAMIN) 1000 MCG tablet Take 1,000 mcg by mouth 2 (two) times a week. Wednesday and Saturday     No current facility-administered medications for this visit.    REVIEW OF SYSTEMS:  [X]  denotes positive finding, [  ] denotes negative finding Cardiac  Comments:  Chest pain or chest pressure:    Shortness of breath upon exertion:    Short of breath when lying flat:    Irregular heart rhythm:        Vascular    Pain in calf, thigh, or hip brought on by ambulation:    Pain in feet at night that wakes you up from your sleep:     Blood clot in your veins:    Leg swelling:           PHYSICAL EXAM: Vitals:   09/01/20 1054 09/01/20 1057  BP: (!) 145/72 (!) 149/67  Pulse: (!) 54 (!) 55  Resp: 18   Temp: 98.1 F (36.7 C)   TempSrc: Temporal   SpO2: 98%   Weight: 230 lb (104.3 kg)   Height: 5\' 8"  (1.727 m)     GENERAL: The patient is a well-nourished male, in no acute distress. The vital signs are documented above. CARDIOVASCULAR: Left carotid incision is well-healed with no bruits bilaterally.  2+ radial pulses bilaterally. PULMONARY: There is good air exchange  MUSCULOSKELETAL: There are no major deformities or cyanosis. NEUROLOGIC: No focal weakness or paresthesias are detected. SKIN: There are no ulcers or rashes noted. PSYCHIATRIC: The patient has a normal affect.  DATA:  CT angiogram reveals high-grade weblike stenosis in his common carotid artery on the left.  Confirms his known right internal carotid artery occlusion.  He does have patent vertebral flow.  MEDICAL ISSUES: I had a very long discussion with the patient and his wife regarding these findings.  I explained that there is really no data which would clearly define the risk of his high-grade common carotid stenosis.  It is concerning that he has a contralateral occlusion and that he had symptoms several years ago in his left brain.  He is clearly progressed from approximately 50% to high-grade stenosis in these several years since his CT scan in 2019.  It is interesting that he had a vein patch using saphenous vein from his right ankle at the time of the surgery in 2003.  I do not have the operative report from this.  His wife is  an extremely good historian and reports that the concern was that he had a diminutive internal carotid artery.  Interestingly his internal carotid is normal caliber now that it has normal arterialized flow.  I have recommended treatment due to the high-grade stenosis and contralateral occlusion.  I explained the option of redo recurrent carotid endarterectomy.  Explained that there would potentially be a slight increased risk for  cranial nerve injury with recurrent surgery.  Also discussed the option of TCAR with stenting via the common carotid.  His anatomy is such that his bifurcation is relatively low in the neck so he would not have a high exposure for redo endarterectomy.  I explained to the patient and his wife that I will review his images with my partners who do perform TCAR.  We will inform them of our discussion and recommendation regarding appropriate treatment option.    Rosetta Posner, MD FACS [facility] [phone]  Note: Portions of this report may have been transcribed using voice recognition software.  Every effort has been made to ensure accuracy; however, inadvertent computerized transcription errors may still be present.

## 2020-09-01 NOTE — Progress Notes (Signed)
[facility]ist of [place]  Patient name: [name] MRN: [id number] DOB: February 06, 1941 Sex: male  REASON FOR VISIT: Discuss CT angiogram neck  HPI: [name] is a 80 y.o. male here today for discussion of recent CT angiogram of his neck.  He is here today with his wife.  He is known to me from prior left carotid endarterectomy in 2003.  He has had known long-term occlusion of right internal carotid artery.  He has been followed with ultrasound.  He did have a left brain stroke in 2019.  At that time CT angiogram revealed widely patent endarterectomy although he did have a 50% weblike stenosis in the proximal common carotid artery.  He has been treated with Eliquis and has had no recurrent stroke symptoms.  He recently was seen in our office with follow-up duplex suggesting high-grade stenosis of the area of concern regarding his common carotid artery and he underwent CT scan for further evaluation of this.  He does have a history of benign brain tumors which are being watched after resection in the past.  He also has history of Guillain-Barr syndrome on 2 occasions  Past Medical History:  Diagnosis Date  . Anemia 10/30/19,11/06/19   iron infusions at [facility]  . Atrial fibrillation ([place]) 02/2016   NEW ONSET  . Brain tumor (benign) ([place]) 1998   2 small tumors- since 2015 (Dr. [name] following)  . Carotid artery occlusion   . Cerebrovascular disease September 09, 2001   TIA, Left brain  . CKD (chronic kidney disease), stage III ([place])   . Dementia ([place])   . Diverticulosis   . Dysrhythmia    Afib  . ED (erectile dysfunction)   . GERD (gastroesophageal reflux disease)   . Guillain-Barre syndrome ([place]) [id number]  . Hx of elevated lipids   . Hyperlipidemia   . Hypertension   . Memory loss   . Stroke [facility]) 2003,2019    Family History  Problem Relation Age of Onset  . Colon cancer Mother        Metastatic   . Stroke Father   .  Diabetes Father   . CVA Father     SOCIAL HISTORY: Social History   Tobacco Use  . Smoking status: Former Smoker    Types: Cigarettes    Quit date: 05/17/1996    Years since quitting: 24.3  . Smokeless tobacco: Never Used  Substance Use Topics  . Alcohol use: Never    Allergies  Allergen Reactions  . Immune Globulins Other (See Comments)    Tetanus Immunes Globulins    (  Pt. Cannot take the Flu shot ) Guillian Barre   . Influenza Vaccines Other (See Comments)    Guillian Barre [name]  . Rho (D) Immune Globulin Other (See Comments)    Tetanus Immunes Globulins(Pt. Cannot take the Flu shot ) [name]     Current Outpatient Medications  Medication Sig Dispense Refill  . acetaminophen (TYLENOL) 500 MG tablet Take 1,000 mg by mouth every 8 (eight) hours as needed for moderate pain.    Marland Kitchen aspirin 81 MG chewable tablet Chew 1 tablet (81 mg total) by mouth daily. 30 tablet 0  . Cholecalciferol (VITAMIN D) 50 MCG (2000 UT) CAPS Take 2,000 Units by mouth daily.     Marland Kitchen DILT-XR 180 MG 24 hr capsule Take 180 mg by mouth every morning.    . diltiazem (CARDIZEM CD) 180 MG 24 hr capsule Take 180 mg by  mouth every morning.    Marland Kitchen ELIQUIS 5 MG TABS tablet TAKE ONE TABLET BY MOUTH TWICE DAILY 60 tablet 5  . levofloxacin (LEVAQUIN) 500 MG tablet Take 500 mg by mouth daily.    Marland Kitchen lisinopril-hydrochlorothiazide (PRINZIDE,ZESTORETIC) 10-12.5 MG tablet Take 0.5 tablets by mouth daily.     . pantoprazole (PROTONIX) 40 MG tablet Take 40 mg by mouth daily.    . rosuvastatin (CRESTOR) 20 MG tablet Take 20 mg by mouth every evening.     Marland Kitchen tiZANidine (ZANAFLEX) 2 MG tablet Take 1 tablet (2 mg total) by mouth every 6 (six) hours as needed. 60 tablet 0  . vitamin B-12 (CYANOCOBALAMIN) 1000 MCG tablet Take 1,000 mcg by mouth 2 (two) times a week. Wednesday and Saturday     No current facility-administered medications for this visit.    REVIEW OF SYSTEMS:  [X]  denotes positive finding, [  ] denotes negative finding Cardiac  Comments:  Chest pain or chest pressure:    Shortness of breath upon exertion:    Short of breath when lying flat:    Irregular heart rhythm:        Vascular    Pain in calf, thigh, or hip brought on by ambulation:    Pain in feet at night that wakes you up from your sleep:     Blood clot in your veins:    Leg swelling:           PHYSICAL EXAM: Vitals:   09/01/20 1054 09/01/20 1057  BP: (!) 145/72 (!) 149/67  Pulse: (!) 54 (!) 55  Resp: 18   Temp: 98.1 F (36.7 C)   TempSrc: Temporal   SpO2: 98%   Weight: 230 lb (104.3 kg)   Height: 5\' 8"  (1.727 m)     GENERAL: The patient is a well-nourished male, in no acute distress. The vital signs are documented above. CARDIOVASCULAR: Left carotid incision is well-healed with no bruits bilaterally.  2+ radial pulses bilaterally. PULMONARY: There is good air exchange  MUSCULOSKELETAL: There are no major deformities or cyanosis. NEUROLOGIC: No focal weakness or paresthesias are detected. SKIN: There are no ulcers or rashes noted. PSYCHIATRIC: The patient has a normal affect.  DATA:  CT angiogram reveals high-grade weblike stenosis in his common carotid artery on the left.  Confirms his known right internal carotid artery occlusion.  He does have patent vertebral flow.  MEDICAL ISSUES: I had a very long discussion with the patient and his wife regarding these findings.  I explained that there is really no data which would clearly define the risk of his high-grade common carotid stenosis.  It is concerning that he has a contralateral occlusion and that he had symptoms several years ago in his left brain.  He is clearly progressed from approximately 50% to high-grade stenosis in these several years since his CT scan in 2019.  It is interesting that he had a vein patch using saphenous vein from his right ankle at the time of the surgery in 2003.  I do not have the operative report from this.  His wife is  an extremely good historian and reports that the concern was that he had a diminutive internal carotid artery.  Interestingly his internal carotid is normal caliber now that it has normal arterialized flow.  I have recommended treatment due to the high-grade stenosis and contralateral occlusion.  I explained the option of redo recurrent carotid endarterectomy.  Explained that there would potentially be a slight increased risk for  cranial nerve injury with recurrent surgery.  Also discussed the option of TCAR with stenting via the common carotid.  His anatomy is such that his bifurcation is relatively low in the neck so he would not have a high exposure for redo endarterectomy.  I explained to the patient and his wife that I will review his images with my partners who do perform TCAR.  We will inform them of our discussion and recommendation regarding appropriate treatment option.    Rosetta Posner, MD FACS [facility] [phone]  Note: Portions of this report may have been transcribed using voice recognition software.  Every effort has been made to ensure accuracy; however, inadvertent computerized transcription errors may still be present.

## 2020-09-04 ENCOUNTER — Other Ambulatory Visit: Payer: Self-pay

## 2020-09-08 NOTE — Progress Notes (Signed)
Surgical Instructions    Your procedure is scheduled on Wednesday, September 10, 2020.  Report to [facility] Main Entrance "A" at 05:45 A.M., then check in with the Admitting office.  Call this number if you have problems the morning of surgery:  [phone]   If you have any questions prior to your surgery date call [phone]: Open Monday-Friday 8am-4pm    Remember:  Do not eat or drink after midnight the night before your surgery    Take these medicines the morning of surgery with A SIP OF WATER   diltiazem (CARDIZEM CD) pantoprazole (PROTONIX)  If needed:  acetaminophen (TYLENOL) tiZANidine (ZANAFLEX)  Per MD continue to take Aspirin but stop Eliquis 3 days before surgery.  As of today, STOP taking Aleve, Naproxen, Ibuprofen, Motrin, Advil, Goody's, BC's, all herbal medications, fish oil, and all vitamins.                     Do not wear jewelry,             Do not wear lotions, powders, colognes, or deodorant.            Men may shave face and neck.            Do not bring valuables to the hospital.            [facility] is not responsible for any belongings or valuables.  Do NOT Smoke (Tobacco/Vaping) or drink Alcohol 24 hours prior to your procedure If you use a CPAP at night, you may bring all equipment for your overnight stay.   Contacts, glasses, dentures or bridgework may not be worn into surgery, please bring cases for these belongings   For patients admitted to the hospital, discharge time will be determined by your treatment team.   Patients discharged the day of surgery will not be allowed to drive home, and someone needs to stay with them for 24 hours.    Special instructions:   [place]- Preparing For Surgery  Before surgery, you can play an important role. Because skin is not sterile, your skin needs to be as free of germs as possible. You can reduce the number of germs on your skin by washing with CHG (chlorahexidine gluconate) Soap before  surgery.  CHG is an antiseptic cleaner which kills germs and bonds with the skin to continue killing germs even after washing.    Oral Hygiene is also important to reduce your risk of infection.  Remember - BRUSH YOUR TEETH THE MORNING OF SURGERY WITH YOUR REGULAR TOOTHPASTE  Please do not use if you have an allergy to CHG or antibacterial soaps. If your skin becomes reddened/irritated stop using the CHG.  Do not shave (including legs and underarms) for at least 48 hours prior to first CHG shower. It is OK to shave your face.  Please follow these instructions carefully.   1. Shower the NIGHT BEFORE SURGERY and the MORNING OF SURGERY  2. If you chose to wash your hair, wash your hair first as usual with your normal shampoo.  3. After you shampoo, rinse your hair and body thoroughly to remove the shampoo.  4. Wash Face and genitals (private parts) with your normal soap.   5.  Shower the NIGHT BEFORE SURGERY and the MORNING OF SURGERY with CHG Soap.   6. Use CHG Soap as you would any other liquid soap. You can apply CHG directly to the skin and wash gently with a scrungie  or a clean washcloth.   7. Apply the CHG Soap to your body ONLY FROM THE NECK DOWN.  Do not use on open wounds or open sores. Avoid contact with your eyes, ears, mouth and genitals (private parts). Wash Face and genitals (private parts)  with your normal soap.   8. Wash thoroughly, paying special attention to the area where your surgery will be performed.  9. Thoroughly rinse your body with warm water from the neck down.  10. DO NOT shower/wash with your normal soap after using and rinsing off the CHG Soap.  11. Pat yourself dry with a CLEAN TOWEL.  12. Wear CLEAN PAJAMAS to bed the night before surgery  13. Place CLEAN SHEETS on your bed the night before your surgery  14. DO NOT SLEEP WITH PETS.   Day of Surgery: Take a shower with CHG soap. Wear Clean/Comfortable clothing the morning of surgery Do not apply  any deodorants/lotions.   Remember to brush your teeth WITH YOUR REGULAR TOOTHPASTE.   Please read over the following fact sheets that you were given.

## 2020-09-09 ENCOUNTER — Encounter (HOSPITAL_COMMUNITY)
Admission: RE | Admit: 2020-09-09 | Discharge: 2020-09-09 | Disposition: A | Discharge status: Home / Self Care | Payer: PPO | Source: Ambulatory Visit | Attending: Vascular Surgery | Admitting: Vascular Surgery

## 2020-09-09 ENCOUNTER — Other Ambulatory Visit (HOSPITAL_COMMUNITY): Payer: PPO

## 2020-09-09 ENCOUNTER — Encounter (HOSPITAL_COMMUNITY): Payer: Self-pay

## 2020-09-09 ENCOUNTER — Other Ambulatory Visit: Payer: Self-pay

## 2020-09-09 DIAGNOSIS — E785 Hyperlipidemia, unspecified: Secondary | ICD-10-CM | POA: Diagnosis present

## 2020-09-09 DIAGNOSIS — F0390 Unspecified dementia without behavioral disturbance: Secondary | ICD-10-CM | POA: Diagnosis present

## 2020-09-09 DIAGNOSIS — Z8673 Personal history of transient ischemic attack (TIA), and cerebral infarction without residual deficits: Secondary | ICD-10-CM | POA: Diagnosis not present

## 2020-09-09 DIAGNOSIS — Z888 Allergy status to other drugs, medicaments and biological substances status: Secondary | ICD-10-CM | POA: Diagnosis not present

## 2020-09-09 DIAGNOSIS — Z823 Family history of stroke: Secondary | ICD-10-CM | POA: Diagnosis not present

## 2020-09-09 DIAGNOSIS — Z87891 Personal history of nicotine dependence: Secondary | ICD-10-CM | POA: Diagnosis not present

## 2020-09-09 DIAGNOSIS — Z20822 Contact with and (suspected) exposure to covid-19: Secondary | ICD-10-CM | POA: Insufficient documentation

## 2020-09-09 DIAGNOSIS — Z79899 Other long term (current) drug therapy: Secondary | ICD-10-CM | POA: Diagnosis not present

## 2020-09-09 DIAGNOSIS — Z7982 Long term (current) use of aspirin: Secondary | ICD-10-CM | POA: Diagnosis not present

## 2020-09-09 DIAGNOSIS — K219 Gastro-esophageal reflux disease without esophagitis: Secondary | ICD-10-CM | POA: Diagnosis present

## 2020-09-09 DIAGNOSIS — I4891 Unspecified atrial fibrillation: Secondary | ICD-10-CM | POA: Diagnosis present

## 2020-09-09 DIAGNOSIS — N183 Chronic kidney disease, stage 3 unspecified: Secondary | ICD-10-CM | POA: Diagnosis present

## 2020-09-09 DIAGNOSIS — Z01812 Encounter for preprocedural laboratory examination: Secondary | ICD-10-CM | POA: Insufficient documentation

## 2020-09-09 DIAGNOSIS — I6522 Occlusion and stenosis of left carotid artery: Secondary | ICD-10-CM | POA: Diagnosis present

## 2020-09-09 DIAGNOSIS — I480 Paroxysmal atrial fibrillation: Secondary | ICD-10-CM | POA: Diagnosis not present

## 2020-09-09 DIAGNOSIS — K5790 Diverticulosis of intestine, part unspecified, without perforation or abscess without bleeding: Secondary | ICD-10-CM | POA: Diagnosis present

## 2020-09-09 DIAGNOSIS — I6523 Occlusion and stenosis of bilateral carotid arteries: Secondary | ICD-10-CM | POA: Diagnosis present

## 2020-09-09 DIAGNOSIS — I129 Hypertensive chronic kidney disease with stage 1 through stage 4 chronic kidney disease, or unspecified chronic kidney disease: Secondary | ICD-10-CM | POA: Diagnosis present

## 2020-09-09 DIAGNOSIS — Z7901 Long term (current) use of anticoagulants: Secondary | ICD-10-CM | POA: Diagnosis not present

## 2020-09-09 DIAGNOSIS — Z887 Allergy status to serum and vaccine status: Secondary | ICD-10-CM | POA: Diagnosis not present

## 2020-09-09 LAB — URINALYSIS, ROUTINE W REFLEX MICROSCOPIC
Bilirubin Urine: NEGATIVE
Glucose, UA: NEGATIVE mg/dL
Hgb urine dipstick: NEGATIVE
Ketones, ur: NEGATIVE mg/dL
Leukocytes,Ua: NEGATIVE
Nitrite: NEGATIVE
Protein, ur: NEGATIVE mg/dL
Specific Gravity, Urine: 1.017 (ref 1.005–1.030)
pH: 5 (ref 5.0–8.0)

## 2020-09-09 LAB — BLOOD GAS, ARTERIAL
Acid-base deficit: 3.1 mmol/L — ABNORMAL HIGH (ref 0.0–2.0)
Bicarbonate: 21 mmol/L (ref 20.0–28.0)
FIO2: 21
O2 Saturation: 97.6 %
Patient temperature: 37
pCO2 arterial: 35.1 mmHg (ref 32.0–48.0)
pH, Arterial: 7.394 (ref 7.350–7.450)
pO2, Arterial: 102 mmHg (ref 83.0–108.0)

## 2020-09-09 LAB — COMPREHENSIVE METABOLIC PANEL
ALT: 16 U/L (ref 0–44)
AST: 21 U/L (ref 15–41)
Albumin: 3.5 g/dL (ref 3.5–5.0)
Alkaline Phosphatase: 121 U/L (ref 38–126)
Anion gap: 9 (ref 5–15)
BUN: 28 mg/dL — ABNORMAL HIGH (ref 8–23)
CO2: 19 mmol/L — ABNORMAL LOW (ref 22–32)
Calcium: 9 mg/dL (ref 8.9–10.3)
Chloride: 113 mmol/L — ABNORMAL HIGH (ref 98–111)
Creatinine, Ser: 2.17 mg/dL — ABNORMAL HIGH (ref 0.61–1.24)
GFR, Estimated: 30 mL/min — ABNORMAL LOW (ref >60)
Glucose, Bld: 124 mg/dL — ABNORMAL HIGH (ref 70–99)
Potassium: 4.7 mmol/L (ref 3.5–5.1)
Sodium: 141 mmol/L (ref 135–145)
Total Bilirubin: 1 mg/dL (ref 0.3–1.2)
Total Protein: 6 g/dL — ABNORMAL LOW (ref 6.5–8.1)

## 2020-09-09 LAB — CBC
HCT: 40.7 % (ref 39.0–52.0)
Hemoglobin: 11.9 g/dL — ABNORMAL LOW (ref 13.0–17.0)
MCH: 25.4 pg — ABNORMAL LOW (ref 26.0–34.0)
MCHC: 29.2 g/dL — ABNORMAL LOW (ref 30.0–36.0)
MCV: 87 fL (ref 80.0–100.0)
Platelets: 191 10*3/uL (ref 150–400)
RBC: 4.68 MIL/uL (ref 4.22–5.81)
RDW: 15.9 % — ABNORMAL HIGH (ref 11.5–15.5)
WBC: 10.4 10*3/uL (ref 4.0–10.5)
nRBC: 0 % (ref 0.0–0.2)

## 2020-09-09 LAB — PROTIME-INR
INR: 1 (ref 0.8–1.2)
Prothrombin Time: 13.3 seconds (ref 11.4–15.2)

## 2020-09-09 LAB — SURGICAL PCR SCREEN
MRSA, PCR: NEGATIVE
Staphylococcus aureus: NEGATIVE

## 2020-09-09 LAB — APTT: aPTT: 28 seconds (ref 24–36)

## 2020-09-09 LAB — SARS CORONAVIRUS 2 (TAT 6-24 HRS): SARS Coronavirus 2: NEGATIVE

## 2020-09-09 LAB — TYPE AND SCREEN
ABO/RH(D): O NEG
Antibody Screen: NEGATIVE

## 2020-09-09 NOTE — Progress Notes (Addendum)
Abnormal creatinine level 2.17; patient is scheduled for surgery on 09/10/2020. [name] was informed, and Dr. Curt Jews (his office was called - Jacqlyn Larsen [phone]).

## 2020-09-09 NOTE — Progress Notes (Addendum)
PCP - Dr. Lavone Orn Cardiologist -   PPM/ICD - denies Device Orders - N/A Rep Notified - N/A  Chest x-ray - 11/22/2019 EKG - 01/24/2020 Stress Test - denies ECHO - 05/05/2018 Cardiac Cath - denies  Sleep Study - denies CPAP - N/A  Fasting Blood Sugar - N/A  Blood Thinner Instructions: Eliquis - stopped three days before surgery per MD order Aspirin Instructions: Continue Aspirin per MD order. As of today, STOP taking Aleve, Naproxen, Ibuprofen, Motrin, Advil, Goody's, BC's, all herbal medications, fish oil, and all vitamins.  ERAS Protcol - no    COVID TEST- 09/09/2020   Anesthesia review: yes; cardiac history; Toni Arthurs was notified that this patient is having surgery tomorrow morning.   Patient denies shortness of breath, fever, cough and chest pain at PAT appointment   All instructions explained to the patient, with a verbal understanding of the material. Patient agrees to go over the instructions while at home for a better understanding. Patient also instructed to self quarantine after being tested for COVID-19. The opportunity to ask questions was provided.

## 2020-09-09 NOTE — Anesthesia Preprocedure Evaluation (Addendum)
Anesthesia Evaluation  Patient identified by MRN, date of birth, ID band Patient awake    Reviewed: Allergy & Precautions, NPO status , Patient's Chart, lab work & pertinent test results  Airway Mallampati: III  TM Distance: >3 FB Neck ROM: Full    Dental  (+) Teeth Intact, Dental Advisory Given   Pulmonary former smoker,    breath sounds clear to auscultation       Cardiovascular hypertension,  Rhythm:Regular Rate:Normal     Neuro/Psych    GI/Hepatic   Endo/Other    Renal/GU      Musculoskeletal   Abdominal (+) + obese,   Peds  Hematology   Anesthesia Other Findings   Reproductive/Obstetrics                            Anesthesia Physical Anesthesia Plan  ASA: III  Anesthesia Plan: General   Post-op Pain Management:    Induction: Intravenous  PONV Risk Score and Plan: Ondansetron and Dexamethasone  Airway Management Planned: Oral ETT  Additional Equipment: Arterial line  Intra-op Plan:   Post-operative Plan: Extubation in OR  Informed Consent: I have reviewed the patients History and Physical, chart, labs and discussed the procedure including the risks, benefits and alternatives for the proposed anesthesia with the patient or authorized representative who has indicated his/her understanding and acceptance.     Dental advisory given  Plan Discussed with: CRNA and Anesthesiologist  Anesthesia Plan Comments: (PAT note written 09/09/2020 by Myra Gianotti, PA-C. )       Anesthesia Quick Evaluation

## 2020-09-09 NOTE — Progress Notes (Signed)
Anesthesia Chart Review:  Case: [id number] Date/Time: 09/10/20 0730   Procedure: REDO LEFT CAROTID ARTERY ENDARTERECTOMY (Left )   Anesthesia type: General   Pre-op diagnosis: Left carotid artery stenosis   [facility]: [facility] ROOM 12 / [facility] OR   Surgeons: Rosetta Posner, MD      DISCUSSION: Patient is a 80 year old male scheduled for the above procedure.  History includes former smoker (quit 05/17/96), HTN, HLD, CKD (Stage III), a-fib (diagnosed 02/2016; on Eliquis), CVA, carotid artery disease (chronic RICA occlusion, left CEA 09/13/01, high-grade web-like left CCA stenosis 2022), CVA (TIA 2003; left brain watershed CVA 2019), Ethelene Hal syndrome (1999, 03/2016), GERD, TKA (right 12/03/19), anemia, multi-focal meningioma (s/p craniotomy for resection 1998; s/p SRS 2018), memory loss, right colectomy (04/14/00 for villous adenoma of the cecum), cholecystectomy (04/14/00). BMI is consistent with obesity.  Last cardiology visit 01/24/20 with Tommye Standard, PA-C. No changes made. Six month follow-up planned.   Holding Eliquis for 3 days prior to surgery per reported instructions. Continuing ASA.   09/09/20 presurgical COVID-19 test is in process. Anesthesia team to evaluate on the day of surgery.   VS: BP (!) 111/58   Pulse 73   Temp 36.4 C (Oral)   Resp 18   Ht 5\' 8"  (1.727 m)   Wt 105 kg   SpO2 97%   BMI 35.21 kg/m   PROVIDERS: Lavone Orn, MD is PCP  Allegra Lai, MD is EP cardiologist  Cecil Cobbs, MD is neuro-oncology. Last visit 04/07/20. Known intracranial meningiomas x3.  By notes, meningioma overall stable on MRI, no concerning neurologic finding. 9 month follow-up following next MRI planned.   LABS: Labs reviewed: Acceptable for surgery. Creatinine 2.17, but appears overall consistent with previous results since 11/2019 when Cr ~ 1.9-2.0 range. Known CKD3.  (all labs ordered are listed, but only abnormal results are displayed)  Labs Reviewed  COMPREHENSIVE METABOLIC PANEL -  Abnormal; Notable for the following components:      Result Value   Chloride 113 (*)    CO2 19 (*)    Glucose, Bld 124 (*)    BUN 28 (*)    Creatinine, Ser 2.17 (*)    Total Protein 6.0 (*)    GFR, Estimated 30 (*)    All other components within normal limits  CBC - Abnormal; Notable for the following components:   Hemoglobin 11.9 (*)    MCH 25.4 (*)    MCHC 29.2 (*)    RDW 15.9 (*)    All other components within normal limits  BLOOD GAS, ARTERIAL - Abnormal; Notable for the following components:   Acid-base deficit 3.1 (*)    Allens test (pass/fail) BRACHIAL ARTERY (*)    All other components within normal limits  SURGICAL PCR SCREEN  SARS CORONAVIRUS 2 (TAT 6-24 HRS)  PROTIME-INR  APTT  URINALYSIS, ROUTINE W REFLEX MICROSCOPIC  TYPE AND SCREEN     IMAGES: CTA neck 08/26/20: IMPRESSION: 1. Chronic occlusion right internal carotid artery 2. Postop left carotid endarterectomy. Focal web-like stenosis distal left common carotid artery. Luminal diameter difficult to measure due to the type of stenosis however this is likely hemodynamically significant and may have progressed in the interval. Correlate with Doppler. Atherosclerotic calcification at the left carotid endarterectomy site without additional stenosis in the left internal carotid artery 3. Atherosclerotic disease diffusely in the left vertebral artery without significant stenosis. Right vertebral artery appears widely patent.  CXR 11/22/19: FINDINGS: Lungs are clear. Heart size and pulmonary  vascularity are normal. No adenopathy. There is aortic atherosclerosis. There are foci of degenerative change in the thoracic spine. IMPRESSION: Lungs are clear.  Heart size normal. Aortic Atherosclerosis (ICD10-I70.0).  MRI Brain 04/04/20: IMPRESSION: 1. Three intracranial meningiomas are unchanged since May: Right vertex up to 4.8 cm. Left anterior clinoid up to 2.6 cm. Left middle cranial fossa, 1.1 cm. Stable  associated mass effect and vasogenic edema. Chronic segmental occlusion of the superior sagittal sinus. 2. Chronic right ICA occlusion and postoperative encephalomalacia in the left hemisphere. 3. No new intracranial abnormality.   EKG: 01/24/20 ([facility]): SR with frequent PACs.   CV: Carotid US 08/04/2020 Summary:  - Right Carotid: Evidence consistent with a total occlusion of the right ICA.  - Left Carotid: Velocities in the left ICA are consistent with a 1-39% stenosis. Hemodynamically significant plaque >50% visualized in the CCA.  - Vertebrals: Bilateral vertebral arteries demonstrate antegrade flow.  - Subclavians: Bilateral subclavian arteries were stenotic.    Echo 05/05/2018 Study Conclusions  - Left ventricle: The cavity size was normal. Wall thickness was  increased in a pattern of mild LVH. Systolic function was normal.  The estimated ejection fraction was in the range of 50% to 55%.  Wall motion was normal; there were no regional wall motion  abnormalities. Doppler parameters are consistent with abnormal  left ventricular relaxation (grade 1 diastolic dysfunction).  - Mitral valve: Calcified annulus.   Impressions:  - Normal LV systolic function; mild LVH; mild diastolic  dysfunction.    Past Medical History:  Diagnosis Date  . Anemia 10/30/19,11/06/19   iron infusions at [facility]  . Atrial fibrillation ([place]) 02/2016   NEW ONSET  . Brain tumor (benign) ([place]) 1998   2 small tumors- since 2015 (Dr. [name] following)  . Carotid artery occlusion   . Cerebrovascular disease September 09, 2001   TIA, Left brain  . CKD (chronic kidney disease), stage III ([place])   . Dementia ([place])   . Diverticulosis   . Dysrhythmia    Afib  . ED (erectile dysfunction)   . GERD (gastroesophageal reflux disease)   . Guillain-Barre syndrome ([place]) [id number]  . Hx of elevated lipids   . Hyperlipidemia   . Hypertension   . Memory loss   . Stroke [facility]) [id number]    Past  Surgical History:  Procedure Laterality Date  . BRAIN SURGERY  1998   Benign brain tumor removed, Left hemisphere- ? Meningioma  . CAROTID ENDARTERECTOMY Left September 13, 2001   LEFT cea  . CHOLECYSTECTOMY  Nov. 2001  . COLON SURGERY  Feb. 2003   Colonic polyps removed endoscopically  . COLONOSCOPY WITH PROPOFOL N/A 11/11/2015   Procedure: COLONOSCOPY WITH PROPOFOL;  Surgeon: Garlan Fair, MD;  Location: [facility] ENDOSCOPY;  Service: Endoscopy;  Laterality: N/A;  . EYE SURGERY     cataracts  . INGUINAL HERNIA REPAIR  1970's  . TONSILLECTOMY    . TOTAL KNEE ARTHROPLASTY Right 12/03/2019   Procedure: RIGHT TOTAL KNEE ARTHROPLASTY;  Surgeon: Frederik Pear, MD;  Location: [facility] ORS;  Service: Orthopedics;  Laterality: Right;    MEDICATIONS: . acetaminophen (TYLENOL) 500 MG tablet  . aspirin 81 MG chewable tablet  . Cholecalciferol (VITAMIN D) 50 MCG (2000 UT) CAPS  . diltiazem (CARDIZEM CD) 180 MG 24 hr capsule  . docusate sodium (COLACE) 100 MG capsule  . ELIQUIS 5 MG TABS tablet  . lisinopril-hydrochlorothiazide (PRINZIDE,ZESTORETIC) 10-12.5 MG tablet  . pantoprazole (PROTONIX) 40 MG tablet  . rosuvastatin (CRESTOR) 20 MG  tablet  . tiZANidine (ZANAFLEX) 2 MG tablet  . vitamin B-12 (CYANOCOBALAMIN) 1000 MCG tablet   No current facility-administered medications for this encounter.    Myra Gianotti, PA-C Surgical Short Stay/Anesthesiology [facility] Phone [phone] [facility] Phone [phone] 09/09/2020 2:28 PM

## 2020-09-10 ENCOUNTER — Inpatient Hospital Stay (HOSPITAL_COMMUNITY): Payer: PPO | Admitting: Anesthesiology

## 2020-09-10 ENCOUNTER — Encounter (HOSPITAL_COMMUNITY)
Admission: RE | Disposition: A | Discharge status: Home / Self Care | Payer: Self-pay | Source: Home / Self Care | Attending: Vascular Surgery

## 2020-09-10 ENCOUNTER — Encounter (HOSPITAL_COMMUNITY): Payer: Self-pay | Admitting: Vascular Surgery

## 2020-09-10 ENCOUNTER — Inpatient Hospital Stay (HOSPITAL_COMMUNITY)
Admission: RE | Admit: 2020-09-10 | Discharge: 2020-09-11 | DRG: 039 | Disposition: A | Discharge status: Home / Self Care | Payer: PPO | Attending: Vascular Surgery | Admitting: Vascular Surgery

## 2020-09-10 ENCOUNTER — Inpatient Hospital Stay (HOSPITAL_COMMUNITY): Payer: PPO | Admitting: Vascular Surgery

## 2020-09-10 DIAGNOSIS — Z8673 Personal history of transient ischemic attack (TIA), and cerebral infarction without residual deficits: Secondary | ICD-10-CM

## 2020-09-10 DIAGNOSIS — F0390 Unspecified dementia without behavioral disturbance: Secondary | ICD-10-CM | POA: Diagnosis present

## 2020-09-10 DIAGNOSIS — I6523 Occlusion and stenosis of bilateral carotid arteries: Secondary | ICD-10-CM | POA: Diagnosis present

## 2020-09-10 DIAGNOSIS — I129 Hypertensive chronic kidney disease with stage 1 through stage 4 chronic kidney disease, or unspecified chronic kidney disease: Secondary | ICD-10-CM | POA: Diagnosis present

## 2020-09-10 DIAGNOSIS — K5790 Diverticulosis of intestine, part unspecified, without perforation or abscess without bleeding: Secondary | ICD-10-CM | POA: Diagnosis present

## 2020-09-10 DIAGNOSIS — Z79899 Other long term (current) drug therapy: Secondary | ICD-10-CM | POA: Diagnosis not present

## 2020-09-10 DIAGNOSIS — Z7982 Long term (current) use of aspirin: Secondary | ICD-10-CM | POA: Diagnosis not present

## 2020-09-10 DIAGNOSIS — Z20822 Contact with and (suspected) exposure to covid-19: Secondary | ICD-10-CM | POA: Diagnosis present

## 2020-09-10 DIAGNOSIS — Z7901 Long term (current) use of anticoagulants: Secondary | ICD-10-CM

## 2020-09-10 DIAGNOSIS — Z888 Allergy status to other drugs, medicaments and biological substances status: Secondary | ICD-10-CM

## 2020-09-10 DIAGNOSIS — I6522 Occlusion and stenosis of left carotid artery: Secondary | ICD-10-CM

## 2020-09-10 DIAGNOSIS — N183 Chronic kidney disease, stage 3 unspecified: Secondary | ICD-10-CM | POA: Diagnosis present

## 2020-09-10 DIAGNOSIS — E785 Hyperlipidemia, unspecified: Secondary | ICD-10-CM | POA: Diagnosis present

## 2020-09-10 DIAGNOSIS — K219 Gastro-esophageal reflux disease without esophagitis: Secondary | ICD-10-CM | POA: Diagnosis present

## 2020-09-10 DIAGNOSIS — Z887 Allergy status to serum and vaccine status: Secondary | ICD-10-CM

## 2020-09-10 DIAGNOSIS — I4891 Unspecified atrial fibrillation: Secondary | ICD-10-CM | POA: Diagnosis present

## 2020-09-10 DIAGNOSIS — Z87891 Personal history of nicotine dependence: Secondary | ICD-10-CM | POA: Diagnosis not present

## 2020-09-10 DIAGNOSIS — Z823 Family history of stroke: Secondary | ICD-10-CM | POA: Diagnosis not present

## 2020-09-10 HISTORY — PX: ENDARTERECTOMY: SHX5162

## 2020-09-10 SURGERY — ENDARTERECTOMY, CAROTID
Anesthesia: General | Site: Neck | Laterality: Left

## 2020-09-10 MED ORDER — ORAL CARE MOUTH RINSE: 15.0000 mL | Freq: Once | OROMUCOSAL | Status: AC

## 2020-09-10 MED ORDER — PROTAMINE SULFATE 10 MG/ML IV SOLN
INTRAVENOUS | Status: DC | PRN
  Administered 2020-09-10: 50 mg via INTRAVENOUS

## 2020-09-10 MED ORDER — SODIUM CHLORIDE 0.9 % IV SOLN
INTRAVENOUS | Status: AC
  Filled 2020-09-10: qty 1.2

## 2020-09-10 MED ORDER — PROTAMINE SULFATE 10 MG/ML IV SOLN
INTRAVENOUS | Status: AC
  Filled 2020-09-10: qty 5

## 2020-09-10 MED ORDER — GLYCOPYRROLATE PF 0.2 MG/ML IJ SOSY
PREFILLED_SYRINGE | INTRAMUSCULAR | Status: AC
  Filled 2020-09-10: qty 1

## 2020-09-10 MED ORDER — ASPIRIN 81 MG PO CHEW
81.0000 mg | CHEWABLE_TABLET | Freq: Every day | ORAL | Status: DC
  Administered 2020-09-10 – 2020-09-11 (×2): 81 mg via ORAL
  Filled 2020-09-10 (×2): qty 1

## 2020-09-10 MED ORDER — MAGNESIUM SULFATE 2 GM/50ML IV SOLN: 2.0000 g | Freq: Every day | INTRAVENOUS | Status: DC | PRN

## 2020-09-10 MED ORDER — PHENOL 1.4 % MT LIQD: 1.0000 | OROMUCOSAL | Status: DC | PRN

## 2020-09-10 MED ORDER — ROSUVASTATIN CALCIUM 20 MG PO TABS
20.0000 mg | ORAL_TABLET | Freq: Every evening | ORAL | Status: DC
  Administered 2020-09-10: 20 mg via ORAL
  Filled 2020-09-10: qty 1

## 2020-09-10 MED ORDER — BISACODYL 10 MG RE SUPP: 10.0000 mg | Freq: Every day | RECTAL | Status: DC | PRN

## 2020-09-10 MED ORDER — HYDROMORPHONE HCL 1 MG/ML IJ SOLN: 0.5000 mg | INTRAMUSCULAR | Status: DC | PRN

## 2020-09-10 MED ORDER — LISINOPRIL-HYDROCHLOROTHIAZIDE 10-12.5 MG PO TABS: 0.5000 | ORAL_TABLET | Freq: Every day | ORAL | Status: DC

## 2020-09-10 MED ORDER — CHLORHEXIDINE GLUCONATE 0.12 % MT SOLN: 15.0000 mL | Freq: Once | OROMUCOSAL | Status: AC

## 2020-09-10 MED ORDER — CHLORHEXIDINE GLUCONATE CLOTH 2 % EX PADS: 6.0000 | MEDICATED_PAD | Freq: Once | CUTANEOUS | Status: DC

## 2020-09-10 MED ORDER — LIDOCAINE HCL (CARDIAC) PF 100 MG/5ML IV SOSY
PREFILLED_SYRINGE | INTRAVENOUS | Status: DC | PRN
  Administered 2020-09-10: 50 mg via INTRAVENOUS

## 2020-09-10 MED ORDER — DOCUSATE SODIUM 100 MG PO CAPS
100.0000 mg | ORAL_CAPSULE | Freq: Two times a day (BID) | ORAL | Status: DC
  Administered 2020-09-10 – 2020-09-11 (×2): 100 mg via ORAL
  Filled 2020-09-10 (×2): qty 1

## 2020-09-10 MED ORDER — SUFENTANIL CITRATE 50 MCG/ML IV SOLN
INTRAVENOUS | Status: DC | PRN
  Administered 2020-09-10: 10 ug via INTRAVENOUS

## 2020-09-10 MED ORDER — POLYETHYLENE GLYCOL 3350 17 G PO PACK: 17.0000 g | PACK | Freq: Every day | ORAL | Status: DC | PRN

## 2020-09-10 MED ORDER — PANTOPRAZOLE SODIUM 40 MG PO TBEC
40.0000 mg | DELAYED_RELEASE_TABLET | Freq: Every day | ORAL | Status: DC
  Administered 2020-09-11: 40 mg via ORAL
  Filled 2020-09-10: qty 1

## 2020-09-10 MED ORDER — ACETAMINOPHEN 650 MG RE SUPP: 325.0000 mg | RECTAL | Status: DC | PRN

## 2020-09-10 MED ORDER — ROCURONIUM BROMIDE 10 MG/ML (PF) SYRINGE
PREFILLED_SYRINGE | INTRAVENOUS | Status: AC
  Filled 2020-09-10: qty 10

## 2020-09-10 MED ORDER — HEPARIN SODIUM (PORCINE) 1000 UNIT/ML IJ SOLN
INTRAMUSCULAR | Status: DC | PRN
  Administered 2020-09-10: 10000 [IU] via INTRAVENOUS

## 2020-09-10 MED ORDER — EPHEDRINE 5 MG/ML INJ
INTRAVENOUS | Status: AC
  Filled 2020-09-10: qty 10

## 2020-09-10 MED ORDER — ONDANSETRON HCL 4 MG/2ML IJ SOLN
INTRAMUSCULAR | Status: AC
  Filled 2020-09-10: qty 2

## 2020-09-10 MED ORDER — SODIUM CHLORIDE 0.9 % IV SOLN: INTRAVENOUS | Status: DC | PRN

## 2020-09-10 MED ORDER — SODIUM CHLORIDE 0.9 % IV SOLN
500.0000 mL | Freq: Once | INTRAVENOUS | Status: AC | PRN
  Administered 2020-09-10: 500 mL via INTRAVENOUS

## 2020-09-10 MED ORDER — ALBUMIN HUMAN 5 % IV SOLN
12.5000 g | Freq: Once | INTRAVENOUS | Status: AC
  Administered 2020-09-10: 12.5 g via INTRAVENOUS

## 2020-09-10 MED ORDER — CHLORHEXIDINE GLUCONATE 0.12 % MT SOLN
OROMUCOSAL | Status: AC
  Administered 2020-09-10: 15 mL via OROMUCOSAL
  Filled 2020-09-10: qty 15

## 2020-09-10 MED ORDER — LIDOCAINE 2% (20 MG/ML) 5 ML SYRINGE
INTRAMUSCULAR | Status: AC
  Filled 2020-09-10: qty 5

## 2020-09-10 MED ORDER — GUAIFENESIN-DM 100-10 MG/5ML PO SYRP: 15.0000 mL | ORAL_SOLUTION | ORAL | Status: DC | PRN

## 2020-09-10 MED ORDER — LACTATED RINGERS IV SOLN: INTRAVENOUS | Status: DC | PRN

## 2020-09-10 MED ORDER — HYDRALAZINE HCL 20 MG/ML IJ SOLN: 5.0000 mg | INTRAMUSCULAR | Status: DC | PRN

## 2020-09-10 MED ORDER — METOPROLOL TARTRATE 5 MG/5ML IV SOLN: 2.0000 mg | INTRAVENOUS | Status: DC | PRN

## 2020-09-10 MED ORDER — LIDOCAINE HCL (PF) 1 % IJ SOLN
INTRAMUSCULAR | Status: AC
  Filled 2020-09-10: qty 30

## 2020-09-10 MED ORDER — OXYCODONE HCL 5 MG/5ML PO SOLN: 5.0000 mg | Freq: Once | ORAL | Status: DC | PRN

## 2020-09-10 MED ORDER — LABETALOL HCL 5 MG/ML IV SOLN
10.0000 mg | INTRAVENOUS | Status: DC | PRN
  Filled 2020-09-10: qty 4

## 2020-09-10 MED ORDER — HEPARIN SODIUM (PORCINE) 1000 UNIT/ML IJ SOLN
INTRAMUSCULAR | Status: AC
  Filled 2020-09-10: qty 1

## 2020-09-10 MED ORDER — SODIUM CHLORIDE (PF) 0.9 % IJ SOLN
INTRAMUSCULAR | Status: AC
  Filled 2020-09-10: qty 10

## 2020-09-10 MED ORDER — LISINOPRIL 10 MG PO TABS
10.0000 mg | ORAL_TABLET | Freq: Every day | ORAL | Status: DC
  Administered 2020-09-11: 10 mg via ORAL
  Filled 2020-09-10: qty 1

## 2020-09-10 MED ORDER — PHENYLEPHRINE HCL-NACL 10-0.9 MG/250ML-% IV SOLN
INTRAVENOUS | Status: DC | PRN
  Administered 2020-09-10: 50 ug/min via INTRAVENOUS

## 2020-09-10 MED ORDER — ALUM & MAG HYDROXIDE-SIMETH 200-200-20 MG/5ML PO SUSP: 15.0000 mL | ORAL | Status: DC | PRN

## 2020-09-10 MED ORDER — LACTATED RINGERS IV SOLN: INTRAVENOUS | Status: DC

## 2020-09-10 MED ORDER — CEFAZOLIN SODIUM-DEXTROSE 2-4 GM/100ML-% IV SOLN
2.0000 g | INTRAVENOUS | Status: DC
  Filled 2020-09-10: qty 100

## 2020-09-10 MED ORDER — SODIUM CHLORIDE 0.9 % IV SOLN: INTRAVENOUS | Status: DC

## 2020-09-10 MED ORDER — 0.9 % SODIUM CHLORIDE (POUR BTL) OPTIME
TOPICAL | Status: DC | PRN
  Administered 2020-09-10: 2000 mL

## 2020-09-10 MED ORDER — FENTANYL CITRATE (PF) 100 MCG/2ML IJ SOLN: 25.0000 ug | INTRAMUSCULAR | Status: DC | PRN

## 2020-09-10 MED ORDER — DILTIAZEM HCL ER COATED BEADS 180 MG PO CP24
180.0000 mg | ORAL_CAPSULE | Freq: Every day | ORAL | Status: DC
  Administered 2020-09-11: 180 mg via ORAL
  Filled 2020-09-10: qty 1

## 2020-09-10 MED ORDER — POTASSIUM CHLORIDE CRYS ER 20 MEQ PO TBCR: 20.0000 meq | EXTENDED_RELEASE_TABLET | Freq: Every day | ORAL | Status: DC | PRN

## 2020-09-10 MED ORDER — OXYCODONE HCL 5 MG PO TABS: 5.0000 mg | ORAL_TABLET | Freq: Once | ORAL | Status: DC | PRN

## 2020-09-10 MED ORDER — OXYCODONE-ACETAMINOPHEN 5-325 MG PO TABS: 1.0000 | ORAL_TABLET | ORAL | Status: DC | PRN

## 2020-09-10 MED ORDER — GLYCOPYRROLATE 0.2 MG/ML IJ SOLN
INTRAMUSCULAR | Status: DC | PRN
  Administered 2020-09-10: .2 mg via INTRAVENOUS

## 2020-09-10 MED ORDER — SUGAMMADEX SODIUM 200 MG/2ML IV SOLN
INTRAVENOUS | Status: DC | PRN
  Administered 2020-09-10: 200 mg via INTRAVENOUS

## 2020-09-10 MED ORDER — ACETAMINOPHEN 325 MG PO TABS: 325.0000 mg | ORAL_TABLET | ORAL | Status: DC | PRN

## 2020-09-10 MED ORDER — ONDANSETRON HCL 4 MG/2ML IJ SOLN: 4.0000 mg | Freq: Once | INTRAMUSCULAR | Status: DC | PRN

## 2020-09-10 MED ORDER — HYDROCHLOROTHIAZIDE 12.5 MG PO CAPS
12.5000 mg | ORAL_CAPSULE | Freq: Every day | ORAL | Status: DC
  Administered 2020-09-11: 12.5 mg via ORAL
  Filled 2020-09-10: qty 1

## 2020-09-10 MED ORDER — EPHEDRINE SULFATE-NACL 50-0.9 MG/10ML-% IV SOSY
PREFILLED_SYRINGE | INTRAVENOUS | Status: DC | PRN
  Administered 2020-09-10: 15 mg via INTRAVENOUS

## 2020-09-10 MED ORDER — DEXAMETHASONE SODIUM PHOSPHATE 10 MG/ML IJ SOLN
INTRAMUSCULAR | Status: AC
  Filled 2020-09-10: qty 1

## 2020-09-10 MED ORDER — CEFAZOLIN SODIUM-DEXTROSE 2-4 GM/100ML-% IV SOLN
2.0000 g | Freq: Three times a day (TID) | INTRAVENOUS | Status: AC
  Administered 2020-09-10 (×2): 2 g via INTRAVENOUS
  Filled 2020-09-10 (×2): qty 100

## 2020-09-10 MED ORDER — ALBUMIN HUMAN 5 % IV SOLN
INTRAVENOUS | Status: AC
  Filled 2020-09-10: qty 250

## 2020-09-10 MED ORDER — PROPOFOL 10 MG/ML IV BOLUS
INTRAVENOUS | Status: AC
  Filled 2020-09-10: qty 20

## 2020-09-10 MED ORDER — SUFENTANIL CITRATE 50 MCG/ML IV SOLN
INTRAVENOUS | Status: AC
  Filled 2020-09-10: qty 1

## 2020-09-10 MED ORDER — ONDANSETRON HCL 4 MG/2ML IJ SOLN: 4.0000 mg | Freq: Four times a day (QID) | INTRAMUSCULAR | Status: DC | PRN

## 2020-09-10 MED ORDER — ROCURONIUM 10MG/ML (10ML) SYRINGE FOR MEDFUSION PUMP - OPTIME
INTRAVENOUS | Status: DC | PRN
  Administered 2020-09-10: 60 mg via INTRAVENOUS

## 2020-09-10 MED ORDER — DEXAMETHASONE SODIUM PHOSPHATE 10 MG/ML IJ SOLN
INTRAMUSCULAR | Status: DC | PRN
  Administered 2020-09-10: 10 mg via INTRAVENOUS

## 2020-09-10 MED ORDER — ONDANSETRON HCL 4 MG/2ML IJ SOLN
INTRAMUSCULAR | Status: DC | PRN
  Administered 2020-09-10: 4 mg via INTRAVENOUS

## 2020-09-10 MED ORDER — PROPOFOL 10 MG/ML IV BOLUS
INTRAVENOUS | Status: DC | PRN
  Administered 2020-09-10: 130 mg via INTRAVENOUS

## 2020-09-10 SURGICAL SUPPLY — 42 items
CANISTER SUCT 3000ML PPV (MISCELLANEOUS) ×2 IMPLANT
CANNULA VESSEL 3MM 2 BLNT TIP (CANNULA) ×4 IMPLANT
CATH ROBINSON RED A/P 18FR (CATHETERS) ×2 IMPLANT
CLIP LIGATING EXTRA MED SLVR (CLIP) ×2 IMPLANT
CLIP LIGATING EXTRA SM BLUE (MISCELLANEOUS) ×2 IMPLANT
COVER WAND RF STERILE (DRAPES) ×2 IMPLANT
DECANTER SPIKE VIAL GLASS SM (MISCELLANEOUS) IMPLANT
DERMABOND ADVANCED (GAUZE/BANDAGES/DRESSINGS) ×1
DERMABOND ADVANCED .7 DNX12 (GAUZE/BANDAGES/DRESSINGS) ×1 IMPLANT
DRAIN CHANNEL 10F 3/8 F FF (DRAIN) IMPLANT
DRAIN HEMOVAC 1/8 X 5 (WOUND CARE) IMPLANT
ELECT REM PT RETURN 9FT ADLT (ELECTROSURGICAL) ×2
ELECTRODE REM PT RTRN 9FT ADLT (ELECTROSURGICAL) ×1 IMPLANT
EVACUATOR SILICONE 100CC (DRAIN) IMPLANT
GLOVE SS BIOGEL STRL SZ 7.5 (GLOVE) ×1 IMPLANT
GLOVE SUPERSENSE BIOGEL SZ 7.5 (GLOVE) ×1
GLOVE SURG ENC MOIS LTX SZ6 (GLOVE) ×2 IMPLANT
GLOVE SURG UNDER POLY LF SZ6.5 (GLOVE) ×10 IMPLANT
GLOVE SURG UNDER POLY LF SZ7 (GLOVE) ×2 IMPLANT
GOWN STRL REUS W/ TWL LRG LVL3 (GOWN DISPOSABLE) ×5 IMPLANT
GOWN STRL REUS W/TWL LRG LVL3 (GOWN DISPOSABLE) ×5
KIT BASIN OR (CUSTOM PROCEDURE TRAY) ×2 IMPLANT
KIT SHUNT ARGYLE CAROTID ART 6 (VASCULAR PRODUCTS) ×2 IMPLANT
KIT TURNOVER KIT B (KITS) ×2 IMPLANT
NEEDLE 22X1 1/2 (OR ONLY) (NEEDLE) IMPLANT
NS IRRIG 1000ML POUR BTL (IV SOLUTION) ×4 IMPLANT
PACK CAROTID (CUSTOM PROCEDURE TRAY) ×2 IMPLANT
PAD ARMBOARD 7.5X6 YLW CONV (MISCELLANEOUS) ×4 IMPLANT
PATCH HEMASHIELD 8X75 (Vascular Products) ×2 IMPLANT
POSITIONER HEAD DONUT 9IN (MISCELLANEOUS) ×2 IMPLANT
SHUNT CAROTID BYPASS 10 (VASCULAR PRODUCTS) IMPLANT
SHUNT CAROTID BYPASS 12FRX15.5 (VASCULAR PRODUCTS) IMPLANT
SUT ETHILON 3 0 PS 1 (SUTURE) IMPLANT
SUT PROLENE 6 0 CC (SUTURE) ×4 IMPLANT
SUT SILK 3 0 (SUTURE)
SUT SILK 3-0 18XBRD TIE 12 (SUTURE) IMPLANT
SUT VIC AB 3-0 SH 27 (SUTURE) ×2
SUT VIC AB 3-0 SH 27X BRD (SUTURE) ×2 IMPLANT
SUT VICRYL 4-0 PS2 18IN ABS (SUTURE) ×2 IMPLANT
SYR CONTROL 10ML LL (SYRINGE) IMPLANT
TOWEL GREEN STERILE (TOWEL DISPOSABLE) ×2 IMPLANT
WATER STERILE IRR 1000ML POUR (IV SOLUTION) ×2 IMPLANT

## 2020-09-10 NOTE — Progress Notes (Signed)
Pt received from PACU. Neuro intact. BP 93/49 (63). 500cc NS bolus started per PRN orders. Pt oriented to room and unit. Call light in reach. Will continue to monitor.  Clyde Canterbury, RN

## 2020-09-10 NOTE — Interval H&P Note (Signed)
History and Physical Interval Note:  09/10/2020 7:22 AM  [name]  has presented today for surgery, with the diagnosis of Left carotid artery stenosis.  The various methods of treatment have been discussed with the patient and family. After consideration of risks, benefits and other options for treatment, the patient has consented to  Procedure(s): REDO LEFT CAROTID ARTERY ENDARTERECTOMY (Left) as a surgical intervention.  The patient's history has been reviewed, patient examined, no change in status, stable for surgery.  I have reviewed the patient's chart and labs.  Questions were answered to the patient's satisfaction.     Curt Jews

## 2020-09-10 NOTE — Anesthesia Procedure Notes (Signed)
Arterial Line Insertion Start/End4/27/2022 7:05 AM, 09/10/2020 7:08 AM Performed by: Betha Loa, CRNA, CRNA  Patient location: Pre-op. Preanesthetic checklist: patient identified, IV checked, risks and benefits discussed, monitors and equipment checked and pre-op evaluation Lidocaine 1% used for infiltration Left, radial was placed Catheter size: 20 G Hand hygiene performed  and maximum sterile barriers used   Attempts: 1 Procedure performed without using ultrasound guided technique. Following insertion, dressing applied and Biopatch.

## 2020-09-10 NOTE — Op Note (Signed)
   OPERATIVE REPORT  DATE OF SURGERY: 09/10/2020  PATIENT: [name], 80 y.o. male MRN: [id number]  DOB: 04/08/1941  PRE-OPERATIVE DIAGNOSIS: Recurrent left carotid Stenosis, asymptomatic  POST-OPERATIVE DIAGNOSIS:  Same  PROCEDURE: Redo left carotid Endarterectomy with Dacron Patch Angioplasty  SURGEON:  Curt Jews, M.D.  PHYSICIAN ASSISTANT: Rhyne  The assistant was needed for exposure and to expedite the case  ANESTHESIA:   general  EBL: Less than 200 ml  Total I/O In: 700 [I.V.:700] Out: 20 [Blood:20]  BLOOD ADMINISTERED: none  DRAINS: none   SPECIMEN: none  COUNTS CORRECT:  YES  PLAN OF CARE: Admit to inpatient   PATIENT DISPOSITION:  PACU - hemodynamically stable and neurologically intact.  PROCEDURE DETAILS: The patient was taken to the operating room placed in supine position.  General anesthesia was administered.  The neck was prepped and draped in the usual sterile fashion.  An incision was made through the old scar anterior to the sternocleidomastoid and carried down through the platysma with electrocautery.  The sternocleidomastoid was reflected posteriorly and the carotid sheath was opened.  The facial vein was ligated with 2-0 silk ties and divided.  The common carotid artery was encircled with an umbilical tape and Rummel tourniquet.  The vagus nerve was identified and preserved.  Dissection was continued onto the carotid bifurcation.  The superior thyroid artery was encircled with a 2-0 silk Potts tie.  The external carotid was encircled with a blue vessel loop and the internal carotid was encircled with an umbilical tape and Rummel tourniquet.  The hypoglossal nerve was identified and preserved.  The patient was given systemic heparin and after adequate circulation time, the internal, external and common carotid arteries were occluded with vascular clamps.  The common carotid artery was opened with an 11 blade and extended  longitudinally with Potts  scissors.  This was continued through the prior vein patch angioplasty.  A 10 shunt was passed up the internal carotid and allowed to backbleed.  It was then passed down the common carotid where it was secured with Rummel tourniquet.  The endarterectomy was begun on the common carotid artery and the plaque was divided proximally with Potts scissors.  The endarterectomy was continued onto the bifurcation.  The external carotid was endarterectomized with an eversion technique and the internal carotid was endarterectomized in an open fashion.  Remaining atheromatous debris was removed from the endarterectomy plane.  The old vein patch was removed.  A Finesse Hemashield Dacron patch was brought onto the field and was sewn as a patch angioplasty with a running 6-0 Prolene suture.  Prior to completion of the closure the shunt was removed and the usual flushing maneuvers were undertaken.  The anastomosis was completed and flow was restored first to the external and then the internal carotid artery.  Excellent flow characteristics were noted with hand-held Doppler in the internal and external carotid arteries.  The patient was given 50 mg of protamine to reverse the heparin.  The wounds were irrigated with saline.  Hemostasis was obtained with electrocautery.  The wounds were closed with 3-0 Vicryl to reapproximate the sternocleidomastoid over the carotid sheath.  The platysma was lysed with a running 3-0 Vicryl suture.  The skin was closed with a 4-0 subcuticular Vicryl stitch.  Dermabond was applied.  The patient was awakened neurologically intact in the operating room and transferred to the recovery room in stable condition   Curt Jews, M.D. 09/10/2020 10:06 AM

## 2020-09-10 NOTE — Progress Notes (Signed)
PHARMACIST LIPID MONITORING   [name] is a 80 y.o. male admitted on 09/10/2020 with recurrent left carotid stenosis s/p endarterectomy.  Pharmacy has been consulted to optimize lipid-lowering therapy with the indication of secondary prevention for clinical ASCVD.  Recent Labs:  Lipid Panel (last 6 months):   No results found for: CHOL, TRIG, HDL, CHOLHDL, VLDL, LDLCALC, LDLDIRECT  Hepatic function panel (last 6 months):   Lab Results  Component Value Date   AST 21 09/09/2020   ALT 16 09/09/2020   ALKPHOS 121 09/09/2020   BILITOT 1.0 09/09/2020    SCr (since admission):   Serum creatinine: 2.17 mg/dL (H) 09/09/20 0942 Estimated creatinine clearance: 32.3 mL/min (A)  Current therapy and lipid therapy tolerance Current lipid-lowering therapy: rosuvastatin 20mg  since at least 2021  Previous lipid-lowering therapies (if applicable):n/a Documented or reported allergies or intolerances to lipid-lowering therapies (if applicable): n/a  Assessment:   Patient is already on a high intensity statin. Will follow up LDL tomorrow and add ezetimibe if > 70. Last LDL 05/05/2018 was 58. Confirmed with patient that he is taking his rosuvastatin and never had adverse effects. Reinforced the benefits and importance of compliance.   Plan:    1.Statin intensity (high intensity recommended for all patients regardless of the LDL):  No statin changes. The patient is already on a high intensity statin.  2.Add ezetimibe (if any one of the following): pending LDL tomorrow   3.Refer to lipid clinic:   No  4.Follow-up with:  Primary care provider - Lavone Orn, MD  5.Follow-up labs after discharge:  No changes in lipid therapy, repeat a lipid panel in one year.     Benetta Spar, PharmD, BCPS, BCCP Clinical Pharmacist  Please check AMION for all [place] phone numbers After 10:00 PM, call [place] [phone]

## 2020-09-10 NOTE — Progress Notes (Signed)
  Day of Surgery Note    Subjective:  Resting comfortably in recovery; wakes easily and follows commands   Vitals:   09/10/20 0952 09/10/20 1000  BP: (!) 98/51   Pulse: 76 63  Resp: 19 17  Temp: 97.7 F (36.5 C)   SpO2: 93% 94%    Incisions:   Clean and dry Extremities:  Moving all extremities equally Lungs:  Non labored Neuro:  In tact; moving all extremities equally; tongue is midline   Assessment/Plan:  This is a 80 y.o. male who is s/p  Redo left carotid endarterectomy with dacron patch angioplasty  -pt doing well in recovery and neuro in tact -will transfer to 4E later today -anticipate dc tomorrow if evening is uneventful   Leontine Locket, PA-C 09/10/2020 10:11 AM [phone]

## 2020-09-10 NOTE — Anesthesia Postprocedure Evaluation (Signed)
Anesthesia Post Note  Patient: [name]  Procedure(s) Performed: REDO LEFT CAROTID ARTERY ENDARTERECTOMY (Left Neck)     Patient location during evaluation: PACU Anesthesia Type: General Level of consciousness: awake and alert Pain management: pain level controlled Vital Signs Assessment: post-procedure vital signs reviewed and stable Respiratory status: spontaneous breathing, nonlabored ventilation, respiratory function stable and patient connected to nasal cannula oxygen Cardiovascular status: blood pressure returned to baseline and stable Postop Assessment: no apparent nausea or vomiting Anesthetic complications: no   No complications documented.  Last Vitals:  Vitals:   09/10/20 1300 09/10/20 1400  BP: (!) 116/47 (!) 101/51  Pulse:    Resp: 16 18  Temp:    SpO2: 96% 90%    Last Pain:  Vitals:   09/10/20 1222  TempSrc:   PainSc: 0-No pain                 Siddarth Hsiung COKER

## 2020-09-10 NOTE — Progress Notes (Signed)
Mobility Specialist - Progress Note   09/10/20 1536  Mobility  Activity Ambulated in hall  Level of Assistance Standby assist, set-up cues, supervision of patient - no hands on  Assistive Device None  Distance Ambulated (ft) 180 ft  Mobility Response Tolerated well  Mobility performed by Mobility specialist  $Mobility charge 1 Mobility   Pre-mobility: 54 HR, 106/51 BP, 95% SpO2 Post-mobility: 49 HR, 102/45 BP, 97% SpO2  Pt ambulated to bathroom the to nurse station and back. He c/o R knee soreness, otherwise asx. Pt on 2L O2 throughout. Pt back in bed after walk, wife in room.   Pricilla Handler Mobility Specialist Mobility Specialist Phone: [phone]

## 2020-09-10 NOTE — Plan of Care (Signed)
  Problem: Education: Goal: Knowledge of General Education information will improve Description: Including pain rating scale, medication(s)/side effects and non-pharmacologic comfort measures Outcome: Progressing   Problem: Health Behavior/Discharge Planning: Goal: Ability to manage health-related needs will improve Outcome: Progressing   Problem: Clinical Measurements: Goal: Ability to maintain clinical measurements within normal limits will improve Outcome: Progressing Goal: Will remain free from infection Outcome: Progressing Goal: Diagnostic test results will improve Outcome: Progressing Goal: Respiratory complications will improve Outcome: Progressing Goal: Cardiovascular complication will be avoided Outcome: Progressing   Problem: Activity: Goal: Risk for activity intolerance will decrease Outcome: Progressing   Problem: Nutrition: Goal: Adequate nutrition will be maintained Outcome: Progressing   Problem: Coping: Goal: Level of anxiety will decrease Outcome: Progressing   Problem: Elimination: Goal: Will not experience complications related to bowel motility Outcome: Progressing Goal: Will not experience complications related to urinary retention Outcome: Progressing   Problem: Pain Managment: Goal: General experience of comfort will improve Outcome: Progressing   Problem: Safety: Goal: Ability to remain free from injury will improve Outcome: Progressing   Problem: Skin Integrity: Goal: Risk for impaired skin integrity will decrease Outcome: Progressing   Problem: Education: Goal: Knowledge of discharge needs will improve Outcome: Progressing   Problem: Clinical Measurements: Goal: Postoperative complications will be avoided or minimized Outcome: Progressing   Problem: Respiratory: Goal: Ability to achieve and maintain a regular respiratory rate will improve Outcome: Progressing   Problem: Skin Integrity: Goal: Demonstration of wound healing  without infection will improve Outcome: Progressing   

## 2020-09-10 NOTE — Transfer of Care (Signed)
Immediate Anesthesia Transfer of Care Note  Patient: [name]  Procedure(s) Performed: REDO LEFT CAROTID ARTERY ENDARTERECTOMY (Left Neck)  Patient Location: PACU  Anesthesia Type:General  Level of Consciousness: oriented, drowsy, patient cooperative and responds to stimulation  Airway & Oxygen Therapy: Patient Spontanous Breathing and Patient connected to nasal cannula oxygen  Post-op Assessment: Report given to RN, Post -op Vital signs reviewed and stable, Patient moving all extremities X 4 and Patient able to stick tongue midline  Post vital signs: Reviewed and stable  Last Vitals:  Vitals Value Taken Time  BP 98/51 09/10/20 0952  Temp    Pulse 70 09/10/20 0955  Resp 17 09/10/20 0955  SpO2 94 % 09/10/20 0955  Vitals shown include unvalidated device data.  Last Pain:  Vitals:   09/10/20 0637  TempSrc:   PainSc: 0-No pain         Complications: No complications documented.

## 2020-09-10 NOTE — Anesthesia Procedure Notes (Signed)
Procedure Name: Intubation Date/Time: 09/10/2020 7:45 AM Performed by: Claris Che, CRNA Pre-anesthesia Checklist: Patient identified, Emergency Drugs available, Suction available, Patient being monitored and Timeout performed Patient Re-evaluated:Patient Re-evaluated prior to induction Oxygen Delivery Method: Circle system utilized Preoxygenation: Pre-oxygenation with 100% oxygen Induction Type: Cricoid Pressure applied and IV induction Ventilation: Mask ventilation without difficulty Laryngoscope Size: Mac and 4 Grade View: Grade II Tube type: Oral Tube size: 8.0 mm Number of attempts: 1 Airway Equipment and Method: Stylet Placement Confirmation: ETT inserted through vocal cords under direct vision,  positive ETCO2 and breath sounds checked- equal and bilateral Secured at: 24 cm Tube secured with: Tape Dental Injury: Teeth and Oropharynx as per pre-operative assessment

## 2020-09-11 ENCOUNTER — Encounter (HOSPITAL_COMMUNITY): Payer: Self-pay | Admitting: Vascular Surgery

## 2020-09-11 ENCOUNTER — Telehealth: Payer: Self-pay

## 2020-09-11 LAB — BASIC METABOLIC PANEL
Anion gap: 9 (ref 5–15)
BUN: 28 mg/dL — ABNORMAL HIGH (ref 8–23)
CO2: 20 mmol/L — ABNORMAL LOW (ref 22–32)
Calcium: 8.3 mg/dL — ABNORMAL LOW (ref 8.9–10.3)
Chloride: 110 mmol/L (ref 98–111)
Creatinine, Ser: 2.29 mg/dL — ABNORMAL HIGH (ref 0.61–1.24)
GFR, Estimated: 28 mL/min — ABNORMAL LOW (ref >60)
Glucose, Bld: 184 mg/dL — ABNORMAL HIGH (ref 70–99)
Potassium: 4.6 mmol/L (ref 3.5–5.1)
Sodium: 139 mmol/L (ref 135–145)

## 2020-09-11 LAB — CBC
HCT: 35.1 % — ABNORMAL LOW (ref 39.0–52.0)
Hemoglobin: 10.4 g/dL — ABNORMAL LOW (ref 13.0–17.0)
MCH: 25.4 pg — ABNORMAL LOW (ref 26.0–34.0)
MCHC: 29.6 g/dL — ABNORMAL LOW (ref 30.0–36.0)
MCV: 85.6 fL (ref 80.0–100.0)
Platelets: 148 10*3/uL — ABNORMAL LOW (ref 150–400)
RBC: 4.1 MIL/uL — ABNORMAL LOW (ref 4.22–5.81)
RDW: 15.7 % — ABNORMAL HIGH (ref 11.5–15.5)
WBC: 16 10*3/uL — ABNORMAL HIGH (ref 4.0–10.5)
nRBC: 0 % (ref 0.0–0.2)

## 2020-09-11 LAB — LIPID PANEL
Cholesterol: 101 mg/dL (ref 0–200)
HDL: 27 mg/dL — ABNORMAL LOW (ref >40)
LDL Cholesterol: 51 mg/dL (ref 0–99)
Total CHOL/HDL Ratio: 3.7 RATIO
Triglycerides: 116 mg/dL (ref <150)
VLDL: 23 mg/dL (ref 0–40)

## 2020-09-11 MED ORDER — OXYCODONE-ACETAMINOPHEN 5-325 MG PO TABS: 1.0000 | ORAL_TABLET | Freq: Four times a day (QID) | ORAL | 0 refills | Status: DC | PRN

## 2020-09-11 NOTE — Telephone Encounter (Signed)
Patient's wife called to ask when patient could restart his Eliquis. Advised her he could start today. Verbalized understanding.

## 2020-09-11 NOTE — Discharge Summary (Addendum)
Discharge Summary     [name] 08/16/1940 80 y.o. male  [id number]  Admission Date: 09/10/2020  Discharge Date: 09/11/2020  Physician: Dr. Donnetta Hutching  Admission Diagnosis: Left carotid artery stenosis [I65.22]   HPI:   This is a 80 y.o. male here today for discussion of recent CT angiogram of his neck.  He is here today with his wife.  He is known to me from prior left carotid endarterectomy in 2003.  He has had known long-term occlusion of right internal carotid artery.  He has been followed with ultrasound.  He did have a left brain stroke in 2019.  At that time CT angiogram revealed widely patent endarterectomy although he did have a 50% weblike stenosis in the proximal common carotid artery.  He has been treated with Eliquis and has had no recurrent stroke symptoms.  He recently was seen in our office with follow-up duplex suggesting high-grade stenosis of the area of concern regarding his common carotid artery and he underwent CT scan for further evaluation of this.  He does have a history of benign brain tumors which are being watched after resection in the past.  He also has history of Guillain-Barr syndrome on 2 occasions  Hospital Course:  The patient was admitted to the hospital and taken to the operating room on 09/10/2020 and underwent redo left carotid endarterectomy.    The pt tolerated the procedure well and was transported to the PACU in good condition.   By POD 1, the pt neuro status was in tact.  He did not have any trouble swallowing.  He did have some bradycardia in the upper 40's overnight but rebounding back to the 60's by morning.  He did have a slight elevation in his creatinine from admission.  He was instructed to hold his ACEI and see PCP Monday to recheck renal function and evaluate when to restart ACEI.    CBC    Component Value Date/Time   WBC 16.0 (H) 09/11/2020 0140   RBC 4.10 (L) 09/11/2020 0140   HGB 10.4 (L) 09/11/2020 0140   HCT 35.1 (L)  09/11/2020 0140   PLT 148 (L) 09/11/2020 0140   MCV 85.6 09/11/2020 0140   MCH 25.4 (L) 09/11/2020 0140   MCHC 29.6 (L) 09/11/2020 0140   RDW 15.7 (H) 09/11/2020 0140   LYMPHSABS 1.6 11/22/2019 1112   MONOABS 1.1 (H) 11/22/2019 1112   EOSABS 0.2 11/22/2019 1112   BASOSABS 0.1 11/22/2019 1112   BMET    Component Value Date/Time   NA 139 09/11/2020 0140   K 4.6 09/11/2020 0140   CL 110 09/11/2020 0140   CO2 20 (L) 09/11/2020 0140   GLUCOSE 184 (H) 09/11/2020 0140   BUN 28 (H) 09/11/2020 0140   CREATININE 2.29 (H) 09/11/2020 0140   CALCIUM 8.3 (L) 09/11/2020 0140   GFRNONAA 28 (L) 09/11/2020 0140   GFRAA 38 (L) 12/04/2019 0401     Discharge Instructions    Discharge patient   Complete by: As directed    Discharge disposition: 01-Home or Self Care   Discharge patient date: 09/11/2020      Discharge Diagnosis:  Left carotid artery stenosis [I65.22]  Secondary Diagnosis: Patient Active Problem List   Diagnosis Date Noted  . Left carotid artery stenosis 09/10/2020  . Arthritis of right knee 12/03/2019  . Osteoarthritis of right knee 11/30/2019  . CVA (cerebral vascular accident) ([place]) 05/05/2018  . Acute CVA (cerebrovascular accident) ([place]) 05/04/2018  . Encounter for therapeutic drug monitoring  01/11/2017  . Meningioma, cerebral ([place]) 12/18/2016  . GBS (Guillain Barre syndrome) ([place]) 04/14/2016  . Neuropathic pain   . Leukocytosis   . Chronic anticoagulation   . Benign essential HTN   . Stage 3 chronic kidney disease ([place])   . PAF (paroxysmal atrial fibrillation) ([place])   . History of meningioma   . Urinary retention   . History of Guillain-Barre syndrome   . Weakness of both lower extremities   . Leg weakness 04/04/2016  . Guillain Barr syndrome ([place]) 04/04/2016  . Cellulitis of left knee 03/12/2016  . Atrial fibrillation, new onset ([place]) 03/07/2016  . Urinary tract infection without hematuria 03/07/2016  . Renal insufficiency 03/07/2016  . Hypokalemia  03/07/2016  . History of CVA (cerebrovascular accident) 03/07/2016  . HTN (hypertension) 03/07/2016  . Atrial fibrillation ([place]) 03/07/2016  . Acute urinary retention   . Carotid occlusion, right 07/11/2012  . Carotid stenosis 07/11/2012   Past Medical History:  Diagnosis Date  . Anemia 10/30/19,11/06/19   iron infusions at [facility]  . Atrial fibrillation ([place]) 02/2016   NEW ONSET  . Brain tumor (benign) ([place]) 1998   2 small tumors- since 2015 (Dr. [name] following)  . Carotid artery occlusion   . Cerebrovascular disease September 09, 2001   TIA, Left brain  . CKD (chronic kidney disease), stage III ([place])   . Dementia ([place])   . Diverticulosis   . Dysrhythmia    Afib  . ED (erectile dysfunction)   . GERD (gastroesophageal reflux disease)   . Guillain-Barre syndrome ([place]) [id number]  . Hx of elevated lipids   . Hyperlipidemia   . Hypertension   . Memory loss   . Stroke [facility]) [phone]    Allergies as of 09/11/2020      Reactions   Immune Globulins Other (See Comments)   Tetanus Immunes Globulins    (  Pt. Cannot take the Flu shot ) Guillian Barre    Influenza Vaccines Other (See Comments)   Guillian Barre Guillian Barre   Rho (d) Immune Globulin Other (See Comments)   Tetanus Immunes Globulins(Pt. Cannot take the Flu shot ) [name]       Medication List    TAKE these medications   acetaminophen 500 MG tablet Commonly known as: TYLENOL Take 1,000 mg by mouth every 8 (eight) hours as needed for moderate pain.   aspirin 81 MG chewable tablet Chew 1 tablet (81 mg total) by mouth daily. Notes to patient: Take tomorrow   diltiazem 180 MG 24 hr capsule Commonly known as: CARDIZEM CD Take 180 mg by mouth daily. Notes to patient: Take tomorrow   docusate sodium 100 MG capsule Commonly known as: COLACE Take 100 mg by mouth 2 (two) times daily. Notes to patient: Take this evening   Eliquis 5 MG Tabs tablet Generic drug: apixaban TAKE ONE TABLET BY MOUTH TWICE  DAILY What changed: how much to take   lisinopril-hydrochlorothiazide 10-12.5 MG tablet Commonly known as: ZESTORETIC Take 0.5 tablets by mouth daily.   oxyCODONE-acetaminophen 5-325 MG tablet Commonly known as: Percocet Take 1 tablet by mouth every 6 (six) hours as needed for severe pain. Notes to patient: Take as needed   pantoprazole 40 MG tablet Commonly known as: PROTONIX Take 40 mg by mouth daily. Notes to patient: Take tomorrow   rosuvastatin 20 MG tablet Commonly known as: CRESTOR Take 20 mg by mouth every evening. Notes to patient: Take this evening   vitamin B-12 1000 MCG tablet Commonly known as:  CYANOCOBALAMIN Take 1,000 mcg by mouth 2 (two) times a week. Wednesday and Saturday   Vitamin D 50 MCG (2000 UT) Caps Take 2,000 Units by mouth daily.        [facility] Discharge Instructions Carotid Endarterectomy (CEA)  Please refer to the following instructions for your post-procedure care. Your surgeon or physician assistant will discuss any changes with you.  Activity  You are encouraged to walk as much as you can. You can slowly return to normal activities but must avoid strenuous activity and heavy lifting until your doctor tell you it's OK. Avoid activities such as vacuuming or swinging a golf club. You can drive after one week if you are comfortable and you are no longer taking prescription pain medications. It is normal to feel tired for serval weeks after your surgery. It is also normal to have difficulty with sleep habits, eating, and bowel movements after surgery. These will go away with time.  Bathing/Showering  You may shower after you come home. Do not soak in a bathtub, hot tub, or swim until the incision heals completely.  Incision Care  Shower every day. Clean your incision with mild soap and water. Pat the area dry with a clean towel. You do not need a bandage unless otherwise instructed. Do not apply any  ointments or creams to your incision. You may have skin glue on your incision. Do not peel it off. It will come off on its own in about one week. Your incision may feel thickened and raised for several weeks after your surgery. This is normal and the skin will soften over time. For Men Only: It's OK to shave around the incision but do not shave the incision itself for 2 weeks. It is common to have numbness under your chin that could last for several months.  Diet  Resume your normal diet. There are no special food restrictions following this procedure. A low fat/low cholesterol diet is recommended for all patients with vascular disease. In order to heal from your surgery, it is CRITICAL to get adequate nutrition. Your body requires vitamins, minerals, and protein. Vegetables are the best source of vitamins and minerals. Vegetables also provide the perfect balance of protein. Processed food has little nutritional value, so try to avoid this.  Medications  Resume taking all of your medications unless your doctor or physician assistant tells you not to.  If your incision is causing pain, you may take over-the- counter pain relievers such as acetaminophen (Tylenol). If you were prescribed a stronger pain medication, please be aware these medications can cause nausea and constipation.  Prevent nausea by taking the medication with a snack or meal. Avoid constipation by drinking plenty of fluids and eating foods with a high amount of fiber, such as fruits, vegetables, and grains.  Do not take Tylenol if you are taking prescription pain medications.  HOLD TAKING YOUR ZESTORETIC UNTIL YOU HAVE YOUR KIDNEY FUNCTION CHECKED NEXT WEEK AND SEE YOUR PCP FOR RECOMMENDATIONS FOR RESTARTING IT.  Follow Up  Our office will schedule a follow up appointment 2-3 weeks following discharge.  Please call us immediately for any of the following conditions  . Increased pain, redness, drainage (pus) from your incision  site. . Fever of 101 degrees or higher. . If you should develop stroke (slurred speech, difficulty swallowing, weakness on one side of your body, loss of vision) you should call 911 and go to the nearest emergency room. .  Reduce your  risk of vascular disease:  . Stop smoking. If you would like help call [facility] at 1-800-QUIT-NOW 6304549149) or [place] at 4704920758. . Manage your cholesterol . Maintain a desired weight . Control your diabetes . Keep your blood pressure down .  If you have any questions, please call the office at [phone].  Prescriptions given: 1.   Roxicet #8 No Refill  Disposition: home  Patient's condition: is Good  Follow up: 1. Dr. Donnetta Hutching in 3-4 weeks. 2. PCP one week to recheck renal function and evaluate when to restart ACEI  Leontine Locket, PA-C [facility] [phone]   --- For [facility] Registry use ---   Modified Rankin score at D/C (0-6): 0  IV medication needed for:  1. Hypertension: No 2. Hypotension: No  Post-op Complications: No  1. Post-op CVA or TIA: No  If yes: Event classification (right eye, left eye, right cortical, left cortical, verterobasilar, other): n/a  If yes: Timing of event (intra-op, <6 hrs post-op, >=6 hrs post-op, unknown): n/a  2. CN injury: No  If yes: CN n/a injuried   3. Myocardial infarction: No  If yes: Dx by (EKG or clinical, Troponin): n/a  4.  CHF: No  5.  Dysrhythmia (new): No  6. Wound infection: No  7. Reperfusion symptoms: No  8. Return to OR: No  If yes: return to OR for (bleeding, neurologic, other CEA incision, other): n/a  Discharge medications: Statin use:  Yes ASA use:  Yes   Beta blocker use:  No ACE-Inhibitor use:  Yes  ARB use:  No CCB use: No P2Y12 Antagonist use: No, [ ]  Plavix, [ ]  Plasugrel, [ ]  Ticlopinine, [ ]  Ticagrelor, [ ]  Other, [ ]  No for medical reason, [ ]  Non-compliant, [ ]  Not-indicated Anti-coagulant use:  No, [ ]  Warfarin, [  ] Rivaroxaban, [ ]  Dabigatran,

## 2020-09-11 NOTE — Progress Notes (Addendum)
  Progress Note    09/11/2020 7:19 AM 1 Day Post-Op  Subjective:  Denies any trouble swallowing.  Walked yesterday and has voided.   Afebrile HR 40's-60's  161'W-960'A systolic 54% RA  Vitals:   09/10/20 2300 09/11/20 0333  BP: (!) 112/48 (!) 126/55  Pulse: (!) 52 (!) 48  Resp: 18 18  Temp: (!) 97.5 F (36.4 C) 97.9 F (36.6 C)  SpO2: 94%      Physical Exam: Neuro:  In tact; moving all extremities equally; tongue is midline Lungs:  Non labored Incision:  Clean and dry  CBC    Component Value Date/Time   WBC 16.0 (H) 09/11/2020 0140   RBC 4.10 (L) 09/11/2020 0140   HGB 10.4 (L) 09/11/2020 0140   HCT 35.1 (L) 09/11/2020 0140   PLT 148 (L) 09/11/2020 0140   MCV 85.6 09/11/2020 0140   MCH 25.4 (L) 09/11/2020 0140   MCHC 29.6 (L) 09/11/2020 0140   RDW 15.7 (H) 09/11/2020 0140   LYMPHSABS 1.6 11/22/2019 1112   MONOABS 1.1 (H) 11/22/2019 1112   EOSABS 0.2 11/22/2019 1112   BASOSABS 0.1 11/22/2019 1112    BMET    Component Value Date/Time   NA 139 09/11/2020 0140   K 4.6 09/11/2020 0140   CL 110 09/11/2020 0140   CO2 20 (L) 09/11/2020 0140   GLUCOSE 184 (H) 09/11/2020 0140   BUN 28 (H) 09/11/2020 0140   CREATININE 2.29 (H) 09/11/2020 0140   CALCIUM 8.3 (L) 09/11/2020 0140   GFRNONAA 28 (L) 09/11/2020 0140   GFRAA 38 (L) 12/04/2019 0401     Intake/Output Summary (Last 24 hours) at 09/11/2020 0719 Last data filed at 09/11/2020 0538 Gross per 24 hour  Intake 1630.32 ml  Output 295 ml  Net 1335.32 ml     Assessment/Plan:  This is a 80 y.o. male who is s/p redo left CEA 1 Day Post-Op  -pt is doing well this am.  Pt's creatinine is slightly elevated from admission.  Discussed with pt holding his ACEI and seeing his PCP on Monday to check his renal function and when to restart ACEI.  He is in agreement.  -bradycardia overnight in the mid to upper 40's but rebounding this morning to the 60's.   -pt neuro exam is in tact -pt has ambulated -pt has  voided -f/u with Dr. Donnetta Hutching in 3-4 weeks in [facility], [facility] [phone]  I have seen and evaluated the patient. I agree with the PA note as documented above.  Postop day 1 status post a redo left carotid endarterectomy.  Looks good this morning and is neuro intact.  Neck incision is clean dry intact with no hematoma.  Discussed plan for discharge today and will follow up with Dr. Donnetta Hutching in 2 to 3 weeks for incision check in [place].  Aspirin statin at discharge.  Marty Heck, MD [facility] [place] Office: [phone]

## 2020-09-11 NOTE — Discharge Instructions (Signed)
[facility]  Discharge Instructions   Carotid Surgery  Please refer to the following instructions for your post-procedure care. Your surgeon or physician assistant will discuss any changes with you.  Activity  You are encouraged to walk as much as you can. You can slowly return to normal activities but must avoid strenuous activity and heavy lifting until your doctor tell you it's okay. Avoid activities such as vacuuming or swinging a golf club. You can drive after one week if you are comfortable and you are no longer taking prescription pain medications. It is normal to feel tired for serval weeks after your surgery. It is also normal to have difficulty with sleep habits, eating, and bowel movements after surgery. These will go away with time.  Bathing/Showering  Shower daily after you go home. Do not soak in a bathtub, hot tub, or swim until the incision heals completely.  Incision Care  Shower every day. Clean your incision with mild soap and water. Pat the area dry with a clean towel. You do not need a bandage unless otherwise instructed. Do not apply any ointments or creams to your incision. You may have skin glue on your incision. Do not peel it off. It will come off on its own in about one week. Your incision may feel thickened and raised for several weeks after your surgery. This is normal and the skin will soften over time.   For Men Only: It's okay to shave around the incision but do not shave the incision itself for 2 weeks. It is common to have numbness under your chin that could last for several months.  Diet  Resume your normal diet. There are no special food restrictions following this procedure. A low fat/low cholesterol diet is recommended for all patients with vascular disease. In order to heal from your surgery, it is CRITICAL to get adequate nutrition. Your body requires vitamins, minerals, and protein. Vegetables are the best source of  vitamins and minerals. Vegetables also provide the perfect balance of protein. Processed food has little nutritional value, so try to avoid this.  Medications  Resume taking all of your medications unless your doctor or physician assistant tells you not to. If your incision is causing pain, you may take over-the- counter pain relievers such as acetaminophen (Tylenol). If you were prescribed a stronger pain medication, please be aware these medications can cause nausea and constipation. Prevent nausea by taking the medication with a snack or meal. Avoid constipation by drinking plenty of fluids and eating foods with a high amount of fiber, such as fruits, vegetables, and grains.  Do not take Tylenol if you are taking prescription pain medications.  HOLD TAKING YOUR ZESTORETIC UNTIL YOU HAVE YOUR KIDNEY FUNCTION CHECKED NEXT WEEK AND SEE YOUR PCP FOR RECOMMENDATIONS FOR RESTARTING IT.  Follow Up  Our office will schedule a follow up appointment 2-3 weeks following discharge.  Please call us immediately for any of the following conditions  . Increased pain, redness, drainage (pus) from your incision site. . Fever of 101 degrees or higher. . If you should develop stroke (slurred speech, difficulty swallowing, weakness on one side of your body, loss of vision) you should call 911 and go to the nearest emergency room. .  Reduce your risk of vascular disease:  . Stop smoking. If you would like help call [facility] at 1-800-QUIT-NOW 251-542-8227) or [place] at [phone]. . Manage your cholesterol . Maintain a desired weight . Control your  diabetes . Keep your blood pressure down .  If you have any questions, please call the office at [phone].

## 2020-09-11 NOTE — Progress Notes (Signed)
Order received to discharge patient.  Telemetry monitor removed and CCMD notified.  PIV access removed.  Discharge instructions, follow up, medications and instructions for their use discussed with patient. 

## 2020-09-19 DIAGNOSIS — I129 Hypertensive chronic kidney disease with stage 1 through stage 4 chronic kidney disease, or unspecified chronic kidney disease: Secondary | ICD-10-CM | POA: Diagnosis not present

## 2020-09-19 DIAGNOSIS — N1832 Chronic kidney disease, stage 3b: Secondary | ICD-10-CM | POA: Diagnosis not present

## 2020-09-19 DIAGNOSIS — K219 Gastro-esophageal reflux disease without esophagitis: Secondary | ICD-10-CM | POA: Diagnosis not present

## 2020-09-23 DIAGNOSIS — N1832 Chronic kidney disease, stage 3b: Secondary | ICD-10-CM | POA: Diagnosis not present

## 2020-09-23 DIAGNOSIS — K219 Gastro-esophageal reflux disease without esophagitis: Secondary | ICD-10-CM | POA: Diagnosis not present

## 2020-09-23 DIAGNOSIS — I129 Hypertensive chronic kidney disease with stage 1 through stage 4 chronic kidney disease, or unspecified chronic kidney disease: Secondary | ICD-10-CM | POA: Diagnosis not present

## 2020-09-23 DIAGNOSIS — I10 Essential (primary) hypertension: Secondary | ICD-10-CM | POA: Diagnosis not present

## 2020-09-23 DIAGNOSIS — D509 Iron deficiency anemia, unspecified: Secondary | ICD-10-CM | POA: Diagnosis not present

## 2020-09-23 DIAGNOSIS — R609 Edema, unspecified: Secondary | ICD-10-CM | POA: Diagnosis not present

## 2020-09-23 DIAGNOSIS — N400 Enlarged prostate without lower urinary tract symptoms: Secondary | ICD-10-CM | POA: Diagnosis not present

## 2020-09-23 DIAGNOSIS — G8929 Other chronic pain: Secondary | ICD-10-CM | POA: Diagnosis not present

## 2020-09-23 DIAGNOSIS — E782 Mixed hyperlipidemia: Secondary | ICD-10-CM | POA: Diagnosis not present

## 2020-09-23 DIAGNOSIS — I480 Paroxysmal atrial fibrillation: Secondary | ICD-10-CM | POA: Diagnosis not present

## 2020-09-29 ENCOUNTER — Ambulatory Visit: Payer: PPO | Admitting: Podiatry

## 2020-09-29 ENCOUNTER — Other Ambulatory Visit: Payer: Self-pay

## 2020-09-29 ENCOUNTER — Encounter: Payer: Self-pay | Admitting: Podiatry

## 2020-09-29 DIAGNOSIS — B351 Tinea unguium: Secondary | ICD-10-CM | POA: Diagnosis not present

## 2020-09-29 DIAGNOSIS — M79675 Pain in left toe(s): Secondary | ICD-10-CM

## 2020-09-29 DIAGNOSIS — M79674 Pain in right toe(s): Secondary | ICD-10-CM | POA: Diagnosis not present

## 2020-10-04 NOTE — Progress Notes (Addendum)
  Subjective:  Patient ID: [name], male    DOB: 1940-08-10,  MRN: [id number]  [name] presents to clinic today for painful thick toenails that are difficult to trim. Pain interferes with ambulation. Aggravating factors include wearing enclosed shoe gear. Pain is relieved with periodic professional debridement.   He voices no new pedal problems on today's visit.  Allergies  Allergen Reactions  . Immune Globulins Other (See Comments)    Tetanus Immunes Globulins    (  Pt. Cannot take the Flu shot ) Guillian Barre   . Influenza Vaccines Other (See Comments)    Guillian Barre [name]  . Rho (D) Immune Globulin Other (See Comments)    Tetanus Immunes Globulins(Pt. Cannot take the Flu shot ) Guillian Barre     Review of Systems: Negative except as noted in the HPI. Objective:   Constitutional [name] is a pleasant 80 y.o. Caucasian male, in NAD. AAO x 3.   Vascular Capillary fill time to digits <3 seconds b/l lower extremities. Faintly palpable pedal pulses b/l. Pedal hair present. Lower extremity skin temperature gradient within normal limits. No cyanosis or clubbing noted.  Neurologic Normal speech. Oriented to person, place, and time. Protective sensation intact 5/5 intact bilaterally with 10g monofilament b/l.  Dermatologic Pedal skin with normal turgor, texture and tone bilaterally. No open wounds bilaterally. No interdigital macerations bilaterally. Toenails 1-5 b/l elongated, discolored, dystrophic, thickened, crumbly with subungual debris and tenderness to dorsal palpation.  Orthopedic: Normal muscle strength 5/5 to all lower extremity muscle groups bilaterally. No pain crepitus or joint limitation noted with ROM b/l. Hammertoe(s) noted to the 2-5 bilaterally.   Radiographs: None Assessment:   1. Pain due to onychomycosis of toenails of both feet    Plan:  Patient was evaluated and treated and all questions answered.  Onychomycosis  with pain -Nails palliatively debridement as below -Educated on self-care  Procedure: Nail Debridement Rationale: Pain Type of Debridement: manual, sharp debridement. Instrumentation: Nail nipper, rotary burr. Number of Nails: 10 -Examined patient. -Patient to continue soft, supportive shoe gear daily. -Toenails 1-5 b/l were debrided in length and girth with sterile nail nippers and dremel without iatrogenic bleeding.  -Iatrogenic laceration sustained during L 4th toe. Treated with Lumicain Hemostatic Solution and alcohol. No further treatment required by patient. -Patient to report any pedal injuries to medical professional immediately. -Patient/POA to call should there be question/concern in the interim.  Return in about 3 months (around 12/30/2020).  Marzetta Board, DPM

## 2020-10-20 ENCOUNTER — Other Ambulatory Visit: Payer: Self-pay

## 2020-10-20 ENCOUNTER — Encounter: Payer: Self-pay | Admitting: Vascular Surgery

## 2020-10-20 ENCOUNTER — Ambulatory Visit (INDEPENDENT_AMBULATORY_CARE_PROVIDER_SITE_OTHER): Payer: PPO | Admitting: Vascular Surgery

## 2020-10-20 VITALS — BP 147/70 | HR 63 | Temp 98.4°F | Resp 16 | Ht 68.0 in | Wt 234.0 lb

## 2020-10-20 DIAGNOSIS — Z9889 Other specified postprocedural states: Secondary | ICD-10-CM

## 2020-10-20 NOTE — Progress Notes (Signed)
   [facility] [place]  Patient name: [name] MRN: [id number] DOB: 02-19-1941 Sex: male  REASON FOR VISIT: Follow-up recent redo left carotid endarterectomy on 09/10/2020  HPI: [name] is a 80 y.o. male here today for follow-up.  Had a remote history of left carotid endarterectomy by myself in 2003.  He has known right internal carotid artery occlusion.  He had developed a high-grade stenosis in the proximal portion of his endarterectomy in the common carotid artery.  He underwent endarterectomy for treatment of this.  He has done well following surgery.  He was discharged home on postoperative day 1.  He reports some periincisional numbness but no neurologic deficits.  Current Outpatient Medications  Medication Sig Dispense Refill  . acetaminophen (TYLENOL) 500 MG tablet Take 1,000 mg by mouth every 8 (eight) hours as needed for moderate pain.    Marland Kitchen aspirin 81 MG chewable tablet Chew 1 tablet (81 mg total) by mouth daily. 30 tablet 0  . Cholecalciferol (VITAMIN D) 50 MCG (2000 UT) CAPS Take 2,000 Units by mouth daily.     Marland Kitchen DILT-XR 180 MG 24 hr capsule Take 180 mg by mouth daily.    Marland Kitchen diltiazem (CARDIZEM CD) 180 MG 24 hr capsule Take 180 mg by mouth daily.    Marland Kitchen docusate sodium (COLACE) 100 MG capsule 1 capsule    . ELIQUIS 5 MG TABS tablet TAKE ONE TABLET BY MOUTH TWICE DAILY (Patient taking differently: Take 5 mg by mouth 2 (two) times daily.) 60 tablet 5  . lisinopril-hydrochlorothiazide (PRINZIDE,ZESTORETIC) 10-12.5 MG tablet Take 0.5 tablets by mouth daily.     Marland Kitchen oxyCODONE-acetaminophen (PERCOCET/ROXICET) 5-325 MG tablet 1 tablet as needed for severe pain    . pantoprazole (PROTONIX) 40 MG tablet Take 40 mg by mouth daily.    . rosuvastatin (CRESTOR) 20 MG tablet Take 20 mg by mouth every evening.     . vitamin B-12 (CYANOCOBALAMIN) 1000 MCG tablet Take 1,000 mcg by mouth 2 (two) times a week. Wednesday and  Saturday     No current facility-administered medications for this visit.     PHYSICAL EXAM: Vitals:   10/20/20 1120  BP: (!) 147/70  Pulse: 63  Resp: 16  Temp: 98.4 F (36.9 C)  TempSrc: Other (Comment)  SpO2: 99%  Weight: 234 lb (106.1 kg)  Height: 5\' 8"  (1.727 m)    GENERAL: The patient is a well-nourished male, in no acute distress. The vital signs are documented above. Neck incision is well-healed.  No bruits.  MEDICAL ISSUES: Stable overall.  We will continue full activities without limitation.  Plan to see him again in 9 months with repeat carotid duplex.  He will notify us immediately should he develop any wound issues or neurologic deficits   Rosetta Posner, MD FACS [facility] [place] Office Tel [phone]  Note: Portions of this report may have been transcribed using voice recognition software.  Every effort has been made to ensure accuracy; however, inadvertent computerized transcription errors may still be present.

## 2020-10-21 ENCOUNTER — Other Ambulatory Visit: Payer: Self-pay

## 2020-10-21 DIAGNOSIS — I6522 Occlusion and stenosis of left carotid artery: Secondary | ICD-10-CM

## 2020-10-22 DIAGNOSIS — E782 Mixed hyperlipidemia: Secondary | ICD-10-CM | POA: Diagnosis not present

## 2020-10-22 DIAGNOSIS — D509 Iron deficiency anemia, unspecified: Secondary | ICD-10-CM | POA: Diagnosis not present

## 2020-10-22 DIAGNOSIS — N1832 Chronic kidney disease, stage 3b: Secondary | ICD-10-CM | POA: Diagnosis not present

## 2020-10-22 DIAGNOSIS — K219 Gastro-esophageal reflux disease without esophagitis: Secondary | ICD-10-CM | POA: Diagnosis not present

## 2020-10-22 DIAGNOSIS — I129 Hypertensive chronic kidney disease with stage 1 through stage 4 chronic kidney disease, or unspecified chronic kidney disease: Secondary | ICD-10-CM | POA: Diagnosis not present

## 2020-10-22 DIAGNOSIS — M1711 Unilateral primary osteoarthritis, right knee: Secondary | ICD-10-CM | POA: Diagnosis not present

## 2020-10-22 DIAGNOSIS — G8929 Other chronic pain: Secondary | ICD-10-CM | POA: Diagnosis not present

## 2020-10-22 DIAGNOSIS — N400 Enlarged prostate without lower urinary tract symptoms: Secondary | ICD-10-CM | POA: Diagnosis not present

## 2020-10-22 DIAGNOSIS — I10 Essential (primary) hypertension: Secondary | ICD-10-CM | POA: Diagnosis not present

## 2020-10-22 DIAGNOSIS — I480 Paroxysmal atrial fibrillation: Secondary | ICD-10-CM | POA: Diagnosis not present

## 2020-11-16 ENCOUNTER — Other Ambulatory Visit: Payer: Self-pay | Admitting: Cardiology

## 2020-11-18 NOTE — Telephone Encounter (Signed)
Prescription refill request for Eliquis received. Indication: Afib Last office visit: Charlcie Cradle, 01/24/2020 Scr: 2.29, 09/11/2020 Age: 80 yo  Weight: 106.1 kg   Pt qualifies for a dose decrease of Eliquis per dosing criteria.

## 2020-11-19 NOTE — Telephone Encounter (Signed)
Called and spoke to pt's wife and made her aware that pt's Eliquis dose is decreasing to Eliquis 2.5mg  BID. Prescription refill sent.

## 2020-11-19 NOTE — Telephone Encounter (Signed)
Decrease dose to 2.5mg  BID now that pt meets 2 criteria for dose reduction

## 2020-11-20 ENCOUNTER — Other Ambulatory Visit: Payer: Self-pay | Admitting: Cardiology

## 2020-11-24 DIAGNOSIS — D509 Iron deficiency anemia, unspecified: Secondary | ICD-10-CM | POA: Diagnosis not present

## 2020-11-24 DIAGNOSIS — M1711 Unilateral primary osteoarthritis, right knee: Secondary | ICD-10-CM | POA: Diagnosis not present

## 2020-11-24 DIAGNOSIS — N1832 Chronic kidney disease, stage 3b: Secondary | ICD-10-CM | POA: Diagnosis not present

## 2020-11-24 DIAGNOSIS — I10 Essential (primary) hypertension: Secondary | ICD-10-CM | POA: Diagnosis not present

## 2020-11-24 DIAGNOSIS — I6350 Cerebral infarction due to unspecified occlusion or stenosis of unspecified cerebral artery: Secondary | ICD-10-CM | POA: Diagnosis not present

## 2020-11-24 DIAGNOSIS — I639 Cerebral infarction, unspecified: Secondary | ICD-10-CM | POA: Diagnosis not present

## 2020-11-24 DIAGNOSIS — E782 Mixed hyperlipidemia: Secondary | ICD-10-CM | POA: Diagnosis not present

## 2020-11-24 DIAGNOSIS — G8929 Other chronic pain: Secondary | ICD-10-CM | POA: Diagnosis not present

## 2020-11-24 DIAGNOSIS — I480 Paroxysmal atrial fibrillation: Secondary | ICD-10-CM | POA: Diagnosis not present

## 2020-11-24 DIAGNOSIS — N400 Enlarged prostate without lower urinary tract symptoms: Secondary | ICD-10-CM | POA: Diagnosis not present

## 2020-11-24 DIAGNOSIS — K219 Gastro-esophageal reflux disease without esophagitis: Secondary | ICD-10-CM | POA: Diagnosis not present

## 2020-11-24 DIAGNOSIS — I129 Hypertensive chronic kidney disease with stage 1 through stage 4 chronic kidney disease, or unspecified chronic kidney disease: Secondary | ICD-10-CM | POA: Diagnosis not present

## 2020-12-04 ENCOUNTER — Telehealth: Payer: Self-pay | Admitting: *Deleted

## 2020-12-04 NOTE — Telephone Encounter (Signed)
   [facility] Pre-operative Risk Assessment    Patient Name: Ulysse Siemen  DOB: 09-04-1940 MRN: [id number]  HEARTCARE STAFF:  - IMPORTANT!!!!!! Under Visit Info/Reason for Call, type in Other and utilize the format Clearance MM/DD/YY or Clearance TBD. Do not use dashes or single digits. - Please review there is not already an duplicate clearance open for this procedure. - If request is for dental extraction, please clarify the # of teeth to be extracted. - If the patient is currently at the dentist's office, call Pre-Op Callback Staff (MA/nurse) to input urgent request.  - If the patient is not currently in the dentist office, please route to the Pre-Op pool.  Request for surgical clearance:  What type of surgery is being performed? 1 TOOTH TO BE EXTRACTED   When is this surgery scheduled? TBD  What type of clearance is required (medical clearance vs. Pharmacy clearance to hold med vs. Both)? BOTH  Are there any medications that need to be held prior to surgery and how long?  [place] name and name of physician performing surgery? Donaldson ORAL, Charlton ; DR. Romie Minus, DDS  What is the office phone number? [phone]   7.   What is the office fax number? [place] [facility]  8.   Anesthesia type (None, local, MAC, general) ? NOT LISTED   Julaine Hua 12/04/2020, 10:36 AM  _________________________________________________________________   (provider comments below)

## 2020-12-08 NOTE — Telephone Encounter (Signed)
   Primary Cardiologist: None  Chart reviewed as part of pre-operative protocol coverage. Simple dental extractions are considered low risk procedures per guidelines and generally do not require any specific cardiac clearance. It is also generally accepted that for simple extractions and dental cleanings, there is no need to interrupt blood thinner therapy.   SBE prophylaxis is not required for the patient.  I will route this recommendation to the requesting party via Epic fax function and remove from pre-op pool.  Please call with questions.  Deberah Pelton, NP 12/08/2020, 10:54 AM

## 2020-12-22 DIAGNOSIS — M1711 Unilateral primary osteoarthritis, right knee: Secondary | ICD-10-CM | POA: Diagnosis not present

## 2020-12-22 DIAGNOSIS — K219 Gastro-esophageal reflux disease without esophagitis: Secondary | ICD-10-CM | POA: Diagnosis not present

## 2020-12-22 DIAGNOSIS — E782 Mixed hyperlipidemia: Secondary | ICD-10-CM | POA: Diagnosis not present

## 2020-12-22 DIAGNOSIS — I639 Cerebral infarction, unspecified: Secondary | ICD-10-CM | POA: Diagnosis not present

## 2020-12-22 DIAGNOSIS — N1832 Chronic kidney disease, stage 3b: Secondary | ICD-10-CM | POA: Diagnosis not present

## 2020-12-22 DIAGNOSIS — D509 Iron deficiency anemia, unspecified: Secondary | ICD-10-CM | POA: Diagnosis not present

## 2020-12-22 DIAGNOSIS — I129 Hypertensive chronic kidney disease with stage 1 through stage 4 chronic kidney disease, or unspecified chronic kidney disease: Secondary | ICD-10-CM | POA: Diagnosis not present

## 2020-12-22 DIAGNOSIS — N400 Enlarged prostate without lower urinary tract symptoms: Secondary | ICD-10-CM | POA: Diagnosis not present

## 2020-12-22 DIAGNOSIS — I10 Essential (primary) hypertension: Secondary | ICD-10-CM | POA: Diagnosis not present

## 2020-12-22 DIAGNOSIS — I480 Paroxysmal atrial fibrillation: Secondary | ICD-10-CM | POA: Diagnosis not present

## 2020-12-22 DIAGNOSIS — I6350 Cerebral infarction due to unspecified occlusion or stenosis of unspecified cerebral artery: Secondary | ICD-10-CM | POA: Diagnosis not present

## 2020-12-30 ENCOUNTER — Ambulatory Visit: Payer: PPO | Admitting: Podiatry

## 2020-12-30 ENCOUNTER — Other Ambulatory Visit: Payer: Self-pay

## 2020-12-30 ENCOUNTER — Encounter: Payer: Self-pay | Admitting: Podiatry

## 2020-12-30 DIAGNOSIS — M79674 Pain in right toe(s): Secondary | ICD-10-CM

## 2020-12-30 DIAGNOSIS — M79675 Pain in left toe(s): Secondary | ICD-10-CM

## 2020-12-30 DIAGNOSIS — B351 Tinea unguium: Secondary | ICD-10-CM | POA: Diagnosis not present

## 2021-01-02 ENCOUNTER — Ambulatory Visit
Admission: RE | Admit: 2021-01-02 | Discharge: 2021-01-02 | Disposition: A | Discharge status: Home / Self Care | Payer: PPO | Source: Ambulatory Visit | Attending: Internal Medicine | Admitting: Internal Medicine

## 2021-01-02 ENCOUNTER — Other Ambulatory Visit: Payer: Self-pay

## 2021-01-02 DIAGNOSIS — G9389 Other specified disorders of brain: Secondary | ICD-10-CM | POA: Diagnosis not present

## 2021-01-02 DIAGNOSIS — J3489 Other specified disorders of nose and nasal sinuses: Secondary | ICD-10-CM | POA: Diagnosis not present

## 2021-01-02 DIAGNOSIS — D329 Benign neoplasm of meninges, unspecified: Secondary | ICD-10-CM | POA: Diagnosis not present

## 2021-01-02 DIAGNOSIS — D320 Benign neoplasm of cerebral meninges: Secondary | ICD-10-CM

## 2021-01-02 DIAGNOSIS — G936 Cerebral edema: Secondary | ICD-10-CM | POA: Diagnosis not present

## 2021-01-02 MED ORDER — GADOBENATE DIMEGLUMINE 529 MG/ML IV SOLN
20.0000 mL | Freq: Once | INTRAVENOUS | Status: AC | PRN
  Administered 2021-01-02: 20 mL via INTRAVENOUS

## 2021-01-03 NOTE — Progress Notes (Signed)
Subjective: [name] is a pleasant 80 y.o. male patient seen today painful thick toenails that are difficult to trim. Pain interferes with ambulation. Aggravating factors include wearing enclosed shoe gear. Pain is relieved with periodic professional debridement.  PCP is Lavone Orn, MD. Last visit was: 09/23/2020.  Allergies  Allergen Reactions   Immune Globulins Other (See Comments)    Tetanus Immunes Globulins    (  Pt. Cannot take the Flu shot ) Guillian Barre    Influenza Vaccines Other (See Comments)    Guillian Barre Guillian Barre   Rho (D) Immune Globulin Other (See Comments)    Tetanus Immunes Globulins    (  Pt. Cannot take the Flu shot ) Guillian Barre     Objective: Physical Exam  General: David Rodriquez is a pleasant 80 y.o. Caucasian male, in NAD. AAO x 3.   Vascular:  Capillary fill time to digits <3 seconds b/l lower extremities. Faintly palpable DP pulse(s) b/l lower extremities. Faintly palpable PT pulse(s) b/l lower extremities. Pedal hair sparse. Lower extremity skin temperature gradient within normal limits. No edema noted b/l lower extremities.  Dermatological:  Skin warm and supple b/l lower extremities. No open wounds b/l lower extremities. No interdigital macerations b/l lower extremities. Toenails 1-5 b/l elongated, discolored, dystrophic, thickened, crumbly with subungual debris and tenderness to dorsal palpation.  Musculoskeletal:  Normal muscle strength 5/5 to all lower extremity muscle groups bilaterally. No pain crepitus or joint limitation noted with ROM b/l lower extremities. Hammertoe(s) noted to the 2-5 bilaterally.  Neurological:  Protective sensation intact 5/5 intact bilaterally with 10g monofilament b/l.  Assessment and Plan:  1. Pain due to onychomycosis of toenails of both feet      -Examined patient. -No new findings. No new orders. -Patient to continue soft, supportive shoe gear daily. -Toenails 1-5 b/l were  debrided in length and girth with sterile nail nippers and dremel without iatrogenic bleeding.  -Patient to report any pedal injuries to medical professional immediately. -Patient/POA to call should there be question/concern in the interim.  Return in about 3 months (around 04/01/2021).  Marzetta Board, DPM

## 2021-01-05 ENCOUNTER — Other Ambulatory Visit: Payer: Self-pay

## 2021-01-05 ENCOUNTER — Inpatient Hospital Stay: Payer: PPO | Attending: Internal Medicine | Admitting: Internal Medicine

## 2021-01-05 ENCOUNTER — Inpatient Hospital Stay: Payer: PPO

## 2021-01-05 VITALS — BP 156/55 | HR 62 | Temp 97.6°F | Resp 18 | Ht 68.0 in | Wt 230.4 lb

## 2021-01-05 DIAGNOSIS — D320 Benign neoplasm of cerebral meninges: Secondary | ICD-10-CM | POA: Insufficient documentation

## 2021-01-05 DIAGNOSIS — Z87891 Personal history of nicotine dependence: Secondary | ICD-10-CM | POA: Insufficient documentation

## 2021-01-05 DIAGNOSIS — Z800 Family history of malignant neoplasm of digestive organs: Secondary | ICD-10-CM | POA: Insufficient documentation

## 2021-01-05 NOTE — Progress Notes (Signed)
[facility] [place], [place] [address] [phone]   Interval Evaluation  Date of Service: 01/05/21 Patient Name: [name] Patient MRN: [id number] Patient DOB: [birth year] Provider: Ventura Sellers, MD  Identifying Statement:  [name] is a 80 y.o. male with  multifocal  meningioma    Oncologic History: March 1998: Left frontal meningioma resection [name]) 12/27/17: Completes fractionated SRS to R parasaggital meningioma  Interval History:  [name] presents today for follow up after recent MRI brain.  No new or progressive neurologic deficits today.  No seizures or headaches.  Recently had carotid endarterectomy, tolerated surgery well without apparent complication.  No apparent decline in cognition or memory.  Medications: Current Outpatient Medications on File Prior to Visit  Medication Sig Dispense Refill   acetaminophen (TYLENOL) 500 MG tablet Take 1,000 mg by mouth every 8 (eight) hours as needed for moderate pain.     apixaban (ELIQUIS) 2.5 MG TABS tablet Take 1 tablet (2.5 mg total) by mouth 2 (two) times daily. 60 tablet 5   aspirin 81 MG chewable tablet Chew 1 tablet (81 mg total) by mouth daily. 30 tablet 0   Cholecalciferol (VITAMIN D) 50 MCG (2000 UT) CAPS Take 2,000 Units by mouth daily.      DILT-XR 180 MG 24 hr capsule Take 180 mg by mouth daily.     docusate sodium (COLACE) 100 MG capsule 1 capsule     lisinopril-hydrochlorothiazide (PRINZIDE,ZESTORETIC) 10-12.5 MG tablet Take 0.5 tablets by mouth daily.      pantoprazole (PROTONIX) 40 MG tablet Take 40 mg by mouth daily.     rosuvastatin (CRESTOR) 20 MG tablet Take 20 mg by mouth every evening.      vitamin B-12 (CYANOCOBALAMIN) 1000 MCG tablet Take 1,000 mcg by mouth 2 (two) times a week. Wednesday and Saturday     HYDROcodone-acetaminophen (NORCO/VICODIN) 5-325 MG tablet Take 1 tablet by mouth every 4 (four) hours as needed.  (Patient not taking: Reported on 01/05/2021)     oxyCODONE-acetaminophen (PERCOCET/ROXICET) 5-325 MG tablet 1 tablet as needed for severe pain (Patient not taking: Reported on 01/05/2021)     penicillin v potassium (VEETID) 500 MG tablet Take 500 mg by mouth 4 (four) times daily. (Patient not taking: Reported on 01/05/2021)     No current facility-administered medications on file prior to visit.    Allergies:  Allergies  Allergen Reactions   Immune Globulins Other (See Comments)    Tetanus Immunes Globulins    (  Pt. Cannot take the Flu shot ) Guillian Barre    Influenza Vaccines Other (See Comments)    Guillian Barre Guillian Barre   Rho (D) Immune Globulin Other (See Comments)    Tetanus Immunes Globulins    (  Pt. Cannot take the Flu shot ) [name]    Past Medical History:  Past Medical History:  Diagnosis Date   Anemia 10/30/19,11/06/19   iron infusions at [facility]   Atrial fibrillation ([place]) 02/2016   NEW ONSET   Brain tumor (benign) ([place]) 1998   2 small tumors- since 2015 (Dr. Saintclair Johnson following)   Carotid artery occlusion    Cerebrovascular disease September 09, 2001   TIA, Left brain   CKD (chronic kidney disease), stage III ([place])    Dementia ([facility])    Diverticulosis    Dysrhythmia    Afib   ED (erectile dysfunction)    GERD (gastroesophageal reflux disease)  Guillain-Barre syndrome ([place]) [id number]   Hx of elevated lipids    Hyperlipidemia    Hypertension    Memory loss    Stroke [facility]) [phone]   Past Surgical History:  Past Surgical History:  Procedure Laterality Date   BRAIN SURGERY  1998   Benign brain tumor removed, Left hemisphere- ? Meningioma   CAROTID ENDARTERECTOMY Left September 13, 2001   LEFT cea   CHOLECYSTECTOMY  Nov. 2001   COLON SURGERY  Feb. 2003   Colonic polyps removed endoscopically   COLONOSCOPY WITH PROPOFOL N/A 11/11/2015   Procedure: COLONOSCOPY WITH PROPOFOL;  Surgeon: Garlan Fair, MD;  Location: [facility];  Service: Endoscopy;   Laterality: N/A;   ENDARTERECTOMY Left 09/10/2020   Procedure: REDO LEFT CAROTID ARTERY ENDARTERECTOMY;  Surgeon: Rosetta Posner, MD;  Location: [facility];  Service: Vascular;  Laterality: Left;   EYE SURGERY     cataracts   INGUINAL HERNIA REPAIR  1970's   TONSILLECTOMY     TOTAL KNEE ARTHROPLASTY Right 12/03/2019   Procedure: RIGHT TOTAL KNEE ARTHROPLASTY;  Surgeon: Frederik Pear, MD;  Location: [facility];  Service: Orthopedics;  Laterality: Right;   Social History:  Social History   Socioeconomic History   Marital status: Married    Spouse name: Not on file   Number of children: Not on file   Years of education: Not on file   Highest education level: Not on file  Occupational History   Not on file  Tobacco Use   Smoking status: Former    Types: Cigarettes    Quit date: 05/17/1996    Years since quitting: 24.6   Smokeless tobacco: Never  Vaping Use   Vaping Use: Never used  Substance and Sexual Activity   Alcohol use: Never   Drug use: No   Sexual activity: Not on file  Other Topics Concern   Not on file  Social History Narrative   Not on file   Social Determinants of Health   Financial Resource Strain: Not on file  Food Insecurity: Not on file  Transportation Needs: Not on file  Physical Activity: Not on file  Stress: Not on file  Social Connections: Not on file  Intimate Partner Violence: Not on file   Family History:  Family History  Problem Relation Age of Onset   Colon cancer Mother        Metastatic    Stroke Father    Diabetes Father    CVA Father     Review of Systems: Constitutional: Denies fevers, chills or abnormal weight loss Eyes: Denies blurriness of vision Ears, nose, mouth, throat, and face: Denies mucositis or sore throat Respiratory: Denies cough, dyspnea or wheezes Cardiovascular: Denies palpitation, chest discomfort or lower extremity swelling Gastrointestinal:  Denies nausea, constipation, diarrhea GU: Denies dysuria or incontinence Skin:  Denies abnormal skin rashes Neurological: Per HPI Musculoskeletal: Denies joint pain, back or neck discomfort. No decrease in ROM Behavioral/Psych: Denies anxiety, disturbance in thought content, and mood instability  Physical Exam: Vitals:   01/05/21 1005  BP: (!) 156/55  Pulse: 62  Resp: 18  Temp: 97.6 F (36.4 C)  SpO2: 97%   KPS: 90. General: Alert, cooperative, pleasant, in no acute distress Head: Normal EENT: No conjunctival injection or scleral icterus. Oral mucosa moist Lungs: Resp effort normal Cardiac: Regular rate and rhythm Abdomen: Soft, non-distended abdomen Skin: No rashes cyanosis or petechiae. Extremities: No clubbing or edema  Neurologic Exam: Mental Status: Awake, alert, attentive to examiner. Oriented to  self and environment. Language is fluent with intact comprehension.  Cranial Nerves: Visual acuity is grossly normal. Visual fields are full. Extra-ocular movements intact. No ptosis. Face is symmetric, tongue midline. Motor: Tone and bulk are normal. Power is full in both arms and legs.  Sensory: Intact to light touch and temperature Gait: Normal  Labs: I have reviewed the data as listed    Component Value Date/Time   NA 139 09/11/2020 0140   K 4.6 09/11/2020 0140   CL 110 09/11/2020 0140   CO2 20 (L) 09/11/2020 0140   GLUCOSE 184 (H) 09/11/2020 0140   BUN 28 (H) 09/11/2020 0140   CREATININE 2.29 (H) 09/11/2020 0140   CALCIUM 8.3 (L) 09/11/2020 0140   PROT 6.0 (L) 09/09/2020 0942   ALBUMIN 3.5 09/09/2020 0942   AST 21 09/09/2020 0942   ALT 16 09/09/2020 0942   ALKPHOS 121 09/09/2020 0942   BILITOT 1.0 09/09/2020 0942   GFRNONAA 28 (L) 09/11/2020 0140   GFRAA 38 (L) 12/04/2019 0401   Lab Results  Component Value Date   WBC 16.0 (H) 09/11/2020   NEUTROABS 8.3 (H) 11/22/2019   HGB 10.4 (L) 09/11/2020   HCT 35.1 (L) 09/11/2020   MCV 85.6 09/11/2020   PLT 148 (L) 09/11/2020    Imaging: [facility] [place] Clinician Interpretation: I have personally  reviewed the CNS images as listed.  My interpretation, in the context of the patient's clinical presentation, is stable disease, left frontal meningioma slightly larger.  Treated R parasaggital mass is stable.  MR BRAIN W WO CONTRAST  Result Date: 01/03/2021 CLINICAL DATA:  Follow-up meningioma EXAM: MRI HEAD WITHOUT AND WITH CONTRAST TECHNIQUE: Multiplanar, multiecho pulse sequences of the brain and surrounding structures were obtained without and with intravenous contrast. CONTRAST:  35mL MULTIHANCE GADOBENATE DIMEGLUMINE 529 MG/ML IV SOLN COMPARISON:  04/04/2020 FINDINGS: Brain: 3 intracranial meningiomas: Right parasagittal vertex measuring 33 x 18 mm on 16:135, non progressed when remeasured in a similar fashion. Craniocaudal span is 23 mm as measured on 17:14. There is invasion of the adjacent superior sagittal sinus which is chronic. Local brain mass effect and moderate brain edema. Left anterior clinoid meningioma projecting superiorly and impacting the ventral brain, 2.4 cm in diameter on 17:23, non progressed. Mild adjacent brain edema is stable. Left middle cranial fossa laterally with a 9 mm diameter which is unchanged as measured on 17:25. No new lesion is seen. Encephalomalacia that is extensive in the left cerebral hemisphere and deep to a craniotomy. No acute infarct, acute hemorrhage, or hydrocephalus. Vascular: Chronic right ICA occlusion. Chronic segmental occlusion of the superior sagittal sinus related to tumor infiltration. Skull and upper cervical spine: Prior left-sided craniotomy Sinuses/Orbits: Negative IMPRESSION: 1. Stable compared to 04/04/2020. 2. Right parafalcine meningioma at the vertex with adjacent brain edema and superior sagittal sinus occlusion. 3. Left anterior clinoid meningioma with cerebral mass effect and mild cerebral edema. 4.  Small left middle cranial fossa meningioma without mass effect. Electronically Signed   By: Monte Fantasia M.D.   On: 01/03/2021 08:53     PENDING OFFICIAL READ 10/12/19   Assessment/Plan 1. Meningioma, cerebral [facility])  Mr. Tith is clinically stable today.  MRI continues to demonstrate stable findings, including as of yet untreated left frontal meningioma.  No other concerning neurologic findings today.  We appreciate the opportunity to participate in the care of [name].   We ask that [name] return to clinic in 12 months following next brain MRI, or sooner as needed.  All questions were answered. The patient knows to call the clinic with any problems, questions or concerns. No barriers to learning were detected.  I have spent a total of 30 minutes of face-to-face and non-face-to-face time, excluding clinical staff time, preparing to see patient, ordering tests and/or medications, counseling the patient, and independently interpreting results and communicating results to the patient/family/caregiver    Ventura Sellers, MD Medical Director of Neuro-Oncology [facility] at [place] 01/05/21 10:24 AM

## 2021-01-07 ENCOUNTER — Other Ambulatory Visit: Payer: Self-pay | Admitting: Radiation Therapy

## 2021-01-07 DIAGNOSIS — N1832 Chronic kidney disease, stage 3b: Secondary | ICD-10-CM | POA: Diagnosis not present

## 2021-01-07 DIAGNOSIS — N189 Chronic kidney disease, unspecified: Secondary | ICD-10-CM | POA: Diagnosis not present

## 2021-01-13 DIAGNOSIS — I129 Hypertensive chronic kidney disease with stage 1 through stage 4 chronic kidney disease, or unspecified chronic kidney disease: Secondary | ICD-10-CM | POA: Diagnosis not present

## 2021-01-13 DIAGNOSIS — D631 Anemia in chronic kidney disease: Secondary | ICD-10-CM | POA: Diagnosis not present

## 2021-01-13 DIAGNOSIS — N281 Cyst of kidney, acquired: Secondary | ICD-10-CM | POA: Diagnosis not present

## 2021-01-13 DIAGNOSIS — N1832 Chronic kidney disease, stage 3b: Secondary | ICD-10-CM | POA: Diagnosis not present

## 2021-01-13 DIAGNOSIS — N2581 Secondary hyperparathyroidism of renal origin: Secondary | ICD-10-CM | POA: Diagnosis not present

## 2021-01-13 DIAGNOSIS — I739 Peripheral vascular disease, unspecified: Secondary | ICD-10-CM | POA: Diagnosis not present

## 2021-01-13 DIAGNOSIS — I480 Paroxysmal atrial fibrillation: Secondary | ICD-10-CM | POA: Diagnosis not present

## 2021-01-16 ENCOUNTER — Other Ambulatory Visit (HOSPITAL_COMMUNITY): Payer: Self-pay | Admitting: *Deleted

## 2021-01-20 ENCOUNTER — Other Ambulatory Visit: Payer: Self-pay

## 2021-01-20 ENCOUNTER — Encounter (HOSPITAL_COMMUNITY)
Admission: RE | Admit: 2021-01-20 | Discharge: 2021-01-20 | Disposition: A | Discharge status: Home / Self Care | Payer: PPO | Source: Ambulatory Visit | Attending: Nephrology | Admitting: Nephrology

## 2021-01-20 DIAGNOSIS — N189 Chronic kidney disease, unspecified: Secondary | ICD-10-CM | POA: Diagnosis not present

## 2021-01-20 DIAGNOSIS — D631 Anemia in chronic kidney disease: Secondary | ICD-10-CM | POA: Insufficient documentation

## 2021-01-20 MED ORDER — SODIUM CHLORIDE 0.9 % IV SOLN
510.0000 mg | INTRAVENOUS | Status: DC
  Administered 2021-01-20: 510 mg via INTRAVENOUS
  Filled 2021-01-20: qty 17

## 2021-01-27 ENCOUNTER — Encounter (HOSPITAL_COMMUNITY)
Admission: RE | Admit: 2021-01-27 | Discharge: 2021-01-27 | Disposition: A | Discharge status: Home / Self Care | Payer: PPO | Source: Ambulatory Visit | Attending: Nephrology | Admitting: Nephrology

## 2021-01-27 DIAGNOSIS — D509 Iron deficiency anemia, unspecified: Secondary | ICD-10-CM | POA: Diagnosis not present

## 2021-01-27 DIAGNOSIS — N189 Chronic kidney disease, unspecified: Secondary | ICD-10-CM | POA: Diagnosis not present

## 2021-01-27 DIAGNOSIS — I129 Hypertensive chronic kidney disease with stage 1 through stage 4 chronic kidney disease, or unspecified chronic kidney disease: Secondary | ICD-10-CM | POA: Diagnosis not present

## 2021-01-27 DIAGNOSIS — N400 Enlarged prostate without lower urinary tract symptoms: Secondary | ICD-10-CM | POA: Diagnosis not present

## 2021-01-27 DIAGNOSIS — G8929 Other chronic pain: Secondary | ICD-10-CM | POA: Diagnosis not present

## 2021-01-27 DIAGNOSIS — N1832 Chronic kidney disease, stage 3b: Secondary | ICD-10-CM | POA: Diagnosis not present

## 2021-01-27 DIAGNOSIS — E782 Mixed hyperlipidemia: Secondary | ICD-10-CM | POA: Diagnosis not present

## 2021-01-27 DIAGNOSIS — I480 Paroxysmal atrial fibrillation: Secondary | ICD-10-CM | POA: Diagnosis not present

## 2021-01-27 DIAGNOSIS — I10 Essential (primary) hypertension: Secondary | ICD-10-CM | POA: Diagnosis not present

## 2021-01-27 DIAGNOSIS — K219 Gastro-esophageal reflux disease without esophagitis: Secondary | ICD-10-CM | POA: Diagnosis not present

## 2021-01-27 MED ORDER — SODIUM CHLORIDE 0.9 % IV SOLN
510.0000 mg | INTRAVENOUS | Status: AC
  Administered 2021-01-27: 510 mg via INTRAVENOUS
  Filled 2021-01-27: qty 510

## 2021-02-23 DIAGNOSIS — M1711 Unilateral primary osteoarthritis, right knee: Secondary | ICD-10-CM | POA: Diagnosis not present

## 2021-02-23 DIAGNOSIS — N400 Enlarged prostate without lower urinary tract symptoms: Secondary | ICD-10-CM | POA: Diagnosis not present

## 2021-02-23 DIAGNOSIS — D509 Iron deficiency anemia, unspecified: Secondary | ICD-10-CM | POA: Diagnosis not present

## 2021-02-23 DIAGNOSIS — I10 Essential (primary) hypertension: Secondary | ICD-10-CM | POA: Diagnosis not present

## 2021-02-23 DIAGNOSIS — K219 Gastro-esophageal reflux disease without esophagitis: Secondary | ICD-10-CM | POA: Diagnosis not present

## 2021-02-23 DIAGNOSIS — N1832 Chronic kidney disease, stage 3b: Secondary | ICD-10-CM | POA: Diagnosis not present

## 2021-02-23 DIAGNOSIS — G8929 Other chronic pain: Secondary | ICD-10-CM | POA: Diagnosis not present

## 2021-02-23 DIAGNOSIS — E782 Mixed hyperlipidemia: Secondary | ICD-10-CM | POA: Diagnosis not present

## 2021-02-23 DIAGNOSIS — I129 Hypertensive chronic kidney disease with stage 1 through stage 4 chronic kidney disease, or unspecified chronic kidney disease: Secondary | ICD-10-CM | POA: Diagnosis not present

## 2021-02-23 DIAGNOSIS — I480 Paroxysmal atrial fibrillation: Secondary | ICD-10-CM | POA: Diagnosis not present

## 2021-03-26 DIAGNOSIS — E782 Mixed hyperlipidemia: Secondary | ICD-10-CM | POA: Diagnosis not present

## 2021-03-26 DIAGNOSIS — K219 Gastro-esophageal reflux disease without esophagitis: Secondary | ICD-10-CM | POA: Diagnosis not present

## 2021-03-26 DIAGNOSIS — R7301 Impaired fasting glucose: Secondary | ICD-10-CM | POA: Diagnosis not present

## 2021-03-26 DIAGNOSIS — D6869 Other thrombophilia: Secondary | ICD-10-CM | POA: Diagnosis not present

## 2021-03-26 DIAGNOSIS — M1711 Unilateral primary osteoarthritis, right knee: Secondary | ICD-10-CM | POA: Diagnosis not present

## 2021-03-26 DIAGNOSIS — Z0000 Encounter for general adult medical examination without abnormal findings: Secondary | ICD-10-CM | POA: Diagnosis not present

## 2021-03-26 DIAGNOSIS — I739 Peripheral vascular disease, unspecified: Secondary | ICD-10-CM | POA: Diagnosis not present

## 2021-03-26 DIAGNOSIS — N1832 Chronic kidney disease, stage 3b: Secondary | ICD-10-CM | POA: Diagnosis not present

## 2021-03-26 DIAGNOSIS — I480 Paroxysmal atrial fibrillation: Secondary | ICD-10-CM | POA: Diagnosis not present

## 2021-03-26 DIAGNOSIS — Z86010 Personal history of colonic polyps: Secondary | ICD-10-CM | POA: Diagnosis not present

## 2021-03-26 DIAGNOSIS — N400 Enlarged prostate without lower urinary tract symptoms: Secondary | ICD-10-CM | POA: Diagnosis not present

## 2021-03-26 DIAGNOSIS — I129 Hypertensive chronic kidney disease with stage 1 through stage 4 chronic kidney disease, or unspecified chronic kidney disease: Secondary | ICD-10-CM | POA: Diagnosis not present

## 2021-03-26 DIAGNOSIS — I10 Essential (primary) hypertension: Secondary | ICD-10-CM | POA: Diagnosis not present

## 2021-03-26 DIAGNOSIS — D509 Iron deficiency anemia, unspecified: Secondary | ICD-10-CM | POA: Diagnosis not present

## 2021-03-26 DIAGNOSIS — G8929 Other chronic pain: Secondary | ICD-10-CM | POA: Diagnosis not present

## 2021-04-07 ENCOUNTER — Ambulatory Visit: Payer: PPO | Admitting: Podiatry

## 2021-04-16 DIAGNOSIS — L723 Sebaceous cyst: Secondary | ICD-10-CM | POA: Diagnosis not present

## 2021-04-17 ENCOUNTER — Other Ambulatory Visit: Payer: Self-pay

## 2021-04-17 ENCOUNTER — Encounter: Payer: Self-pay | Admitting: Podiatry

## 2021-04-17 ENCOUNTER — Ambulatory Visit: Payer: PPO | Admitting: Podiatry

## 2021-04-17 DIAGNOSIS — B351 Tinea unguium: Secondary | ICD-10-CM | POA: Diagnosis not present

## 2021-04-17 DIAGNOSIS — M778 Other enthesopathies, not elsewhere classified: Secondary | ICD-10-CM | POA: Diagnosis not present

## 2021-04-17 DIAGNOSIS — M79674 Pain in right toe(s): Secondary | ICD-10-CM | POA: Diagnosis not present

## 2021-04-17 DIAGNOSIS — M19079 Primary osteoarthritis, unspecified ankle and foot: Secondary | ICD-10-CM

## 2021-04-17 DIAGNOSIS — M79675 Pain in left toe(s): Secondary | ICD-10-CM | POA: Diagnosis not present

## 2021-04-23 DIAGNOSIS — I480 Paroxysmal atrial fibrillation: Secondary | ICD-10-CM | POA: Diagnosis not present

## 2021-04-23 DIAGNOSIS — I129 Hypertensive chronic kidney disease with stage 1 through stage 4 chronic kidney disease, or unspecified chronic kidney disease: Secondary | ICD-10-CM | POA: Diagnosis not present

## 2021-04-23 DIAGNOSIS — I10 Essential (primary) hypertension: Secondary | ICD-10-CM | POA: Diagnosis not present

## 2021-04-23 DIAGNOSIS — N400 Enlarged prostate without lower urinary tract symptoms: Secondary | ICD-10-CM | POA: Diagnosis not present

## 2021-04-23 DIAGNOSIS — G8929 Other chronic pain: Secondary | ICD-10-CM | POA: Diagnosis not present

## 2021-04-23 DIAGNOSIS — E782 Mixed hyperlipidemia: Secondary | ICD-10-CM | POA: Diagnosis not present

## 2021-04-23 DIAGNOSIS — K219 Gastro-esophageal reflux disease without esophagitis: Secondary | ICD-10-CM | POA: Diagnosis not present

## 2021-04-23 DIAGNOSIS — N1832 Chronic kidney disease, stage 3b: Secondary | ICD-10-CM | POA: Diagnosis not present

## 2021-04-23 NOTE — Progress Notes (Signed)
Subjective: [name] is a pleasant 80 y.o. male patient seen today painful thick toenails that are difficult to trim. Pain interferes with ambulation. Aggravating factors include wearing enclosed shoe gear. Pain is relieved with periodic professional debridement.  Patient also has a secondary complaint of left dorsal midfoot pain.  Patient states is been going on for quite some time is progressive gotten worse.  He states it hurts with ambulation.  He is got a little cyst/underlying arthritic spur.  He would like to discuss treatment options for this.  He does not want surgery.  PCP is Lavone Orn, MD. Last visit was: 09/23/2020.  Allergies  Allergen Reactions   Immune Globulins Other (See Comments)    Tetanus Immunes Globulins    (  Pt. Cannot take the Flu shot ) Guillian Barre    Influenza Vaccines Other (See Comments)    Guillian Barre Guillian Barre   Rho (D) Immune Globulin Other (See Comments)    Tetanus Immunes Globulins    (  Pt. Cannot take the Flu shot ) Guillian Barre     Objective: Physical Exam  General: [name] is a pleasant 80 y.o. Caucasian male, in NAD. AAO x 3.   Vascular:  Capillary fill time to digits <3 seconds b/l lower extremities. Faintly palpable DP pulse(s) b/l lower extremities. Faintly palpable PT pulse(s) b/l lower extremities. Pedal hair sparse. Lower extremity skin temperature gradient within normal limits. No edema noted b/l lower extremities.  Dermatological:  Skin warm and supple b/l lower extremities. No open wounds b/l lower extremities. No interdigital macerations b/l lower extremities. Toenails 1-5 b/l elongated, discolored, dystrophic, thickened, crumbly with subungual debris and tenderness to dorsal palpation.  Musculoskeletal:  Normal muscle strength 5/5 to all lower extremity muscle groups bilaterally. No pain crepitus or joint limitation noted with ROM b/l lower extremities. Hammertoe(s) noted to the 2-5 bilaterally.   Left dorsal midfoot exostosis with underlying cyst/spur.  Pain on palpation.  No pain at the Lisfranc interval.  No pain with resisted dorsiflexion or ex plantarflexion of the digits.  Neurological:  Protective sensation intact 5/5 intact bilaterally with 10g monofilament b/l.  Assessment and Plan:  1. Arthritis of midfoot   2. Capsulitis of left foot   3. Pain due to onychomycosis of toenails of both feet       -Examined patient. -No new findings. No new orders. -Patient to continue soft, supportive shoe gear daily. -Toenails 1-5 b/l were debrided in length and girth with sterile nail nippers and dremel without iatrogenic bleeding.  -Patient to report any pedal injuries to medical professional immediately. -Patient/POA to call should there be question/concern in the interim.  Left dorsal midfoot capsulitis with underlying arthritis -I explained the patient the etiology of capsulitis and various treatment options were extensively discussed. -Given the amount of pain that he is having I believe patient will benefit from steroid injection help decrease acute inflammatory component associated pain.  Patient agrees with the plan. -I discussed shoe gear modification and offloading and with the patient in extensive detail. -If no improvement will benefit from surgical excision of the spur   Return in about 3 months (around 07/16/2021) for [place] .  Felipa Furnace, DPM

## 2021-05-15 ENCOUNTER — Ambulatory Visit: Payer: PPO | Admitting: Podiatry

## 2021-05-15 ENCOUNTER — Other Ambulatory Visit: Payer: Self-pay

## 2021-05-15 DIAGNOSIS — M19079 Primary osteoarthritis, unspecified ankle and foot: Secondary | ICD-10-CM | POA: Diagnosis not present

## 2021-05-19 NOTE — Progress Notes (Signed)
Subjective: [name] is a pleasant 81 y.o. male patient seen today painful thick toenails that are difficult to trim. Pain interferes with ambulation. Aggravating factors include wearing enclosed shoe gear. Pain is relieved with periodic professional debridement.  Patient also has a secondary complaint of left dorsal midfoot pain.  Patient states is doing a lot better.  The pain has gone down considerably.  He has made some shoe gear modifications. PCP is Ross Orn, MD. Last visit was: 09/23/2020.  Allergies  Allergen Reactions   Immune Globulins Other (See Comments)    Tetanus Immunes Globulins    (  Pt. Cannot take the Flu shot ) Guillian Barre    Influenza Vaccines Other (See Comments)    Guillian Barre Guillian Barre   Rho (D) Immune Globulin Other (See Comments)    Tetanus Immunes Globulins    (  Pt. Cannot take the Flu shot ) Guillian Barre     Objective: Physical Exam  General: [name] is a pleasant 81 y.o. Caucasian male, in NAD. AAO x 3.   Vascular:  Capillary fill time to digits <3 seconds b/l lower extremities. Faintly palpable DP pulse(s) b/l lower extremities. Faintly palpable PT pulse(s) b/l lower extremities. Pedal hair sparse. Lower extremity skin temperature gradient within normal limits. No edema noted b/l lower extremities.  Dermatological:  Skin warm and supple b/l lower extremities. No open wounds b/l lower extremities. No interdigital macerations b/l lower extremities. Toenails 1-5 b/l elongated, discolored, dystrophic, thickened, crumbly with subungual debris and tenderness to dorsal palpation.  Musculoskeletal:  Normal muscle strength 5/5 to all lower extremity muscle groups bilaterally. No pain crepitus or joint limitation noted with ROM b/l lower extremities. Hammertoe(s) noted to the 2-5 bilaterally.  Left dorsal midfoot exostosis with underlying cyst/spur.  Pain on palpation.  No pain at the Lisfranc interval.  No pain with resisted  dorsiflexion or ex plantarflexion of the digits.  Neurological:  Protective sensation intact 5/5 intact bilaterally with 10g monofilament b/l.  Assessment and Plan:  No diagnosis found.     -Examined patient. -No new findings. No new orders. -Patient to continue soft, supportive shoe gear daily. -Toenails 1-5 b/l were debrided in length and girth with sterile nail nippers and dremel without iatrogenic bleeding.  -Patient to report any pedal injuries to medical professional immediately. -Patient/POA to call should there be question/concern in the interim.  Left dorsal midfoot capsulitis with underlying arthritis -Clinically healed with a steroid injection.  I discussed shoe gear modification and offloading.  He states understanding.  At this time I will hold off on surgical intervention as his pain has improved with steroid injection.  No follow-ups on file.  Felipa Furnace, DPM

## 2021-05-22 DIAGNOSIS — G8929 Other chronic pain: Secondary | ICD-10-CM | POA: Diagnosis not present

## 2021-05-22 DIAGNOSIS — N1832 Chronic kidney disease, stage 3b: Secondary | ICD-10-CM | POA: Diagnosis not present

## 2021-05-22 DIAGNOSIS — I10 Essential (primary) hypertension: Secondary | ICD-10-CM | POA: Diagnosis not present

## 2021-05-22 DIAGNOSIS — I129 Hypertensive chronic kidney disease with stage 1 through stage 4 chronic kidney disease, or unspecified chronic kidney disease: Secondary | ICD-10-CM | POA: Diagnosis not present

## 2021-05-22 DIAGNOSIS — E782 Mixed hyperlipidemia: Secondary | ICD-10-CM | POA: Diagnosis not present

## 2021-05-22 DIAGNOSIS — I480 Paroxysmal atrial fibrillation: Secondary | ICD-10-CM | POA: Diagnosis not present

## 2021-05-22 DIAGNOSIS — N400 Enlarged prostate without lower urinary tract symptoms: Secondary | ICD-10-CM | POA: Diagnosis not present

## 2021-05-22 DIAGNOSIS — K219 Gastro-esophageal reflux disease without esophagitis: Secondary | ICD-10-CM | POA: Diagnosis not present

## 2021-06-09 DIAGNOSIS — Z7901 Long term (current) use of anticoagulants: Secondary | ICD-10-CM | POA: Diagnosis not present

## 2021-06-09 DIAGNOSIS — D320 Benign neoplasm of cerebral meninges: Secondary | ICD-10-CM | POA: Diagnosis not present

## 2021-06-09 DIAGNOSIS — I129 Hypertensive chronic kidney disease with stage 1 through stage 4 chronic kidney disease, or unspecified chronic kidney disease: Secondary | ICD-10-CM | POA: Diagnosis not present

## 2021-06-09 DIAGNOSIS — F0390 Unspecified dementia without behavioral disturbance: Secondary | ICD-10-CM | POA: Diagnosis not present

## 2021-06-09 DIAGNOSIS — D6869 Other thrombophilia: Secondary | ICD-10-CM | POA: Diagnosis not present

## 2021-06-09 DIAGNOSIS — N1832 Chronic kidney disease, stage 3b: Secondary | ICD-10-CM | POA: Diagnosis not present

## 2021-06-09 DIAGNOSIS — K219 Gastro-esophageal reflux disease without esophagitis: Secondary | ICD-10-CM | POA: Diagnosis not present

## 2021-06-09 DIAGNOSIS — I480 Paroxysmal atrial fibrillation: Secondary | ICD-10-CM | POA: Diagnosis not present

## 2021-06-18 ENCOUNTER — Other Ambulatory Visit: Payer: Self-pay | Admitting: Cardiology

## 2021-06-18 NOTE — Telephone Encounter (Addendum)
Eliquis 2.5mg  refill request received. Patient is 81 years old, weight-104.5kg, Crea-2.29 on 09/11/2020, Diagnosis-Afib, and last seen by Tommye Standard on 01/24/2020. Dose is appropriate based on dosing criteria.   Pt needs an appt with Cardiologist; last seen in 2021.Will send a message to schedulers.   Schedulers reached out to pt & made the pt an appt on 3/3. Will send in a refill to requested pharmacy.

## 2021-06-22 DIAGNOSIS — I10 Essential (primary) hypertension: Secondary | ICD-10-CM | POA: Diagnosis not present

## 2021-06-22 DIAGNOSIS — E782 Mixed hyperlipidemia: Secondary | ICD-10-CM | POA: Diagnosis not present

## 2021-06-22 DIAGNOSIS — I480 Paroxysmal atrial fibrillation: Secondary | ICD-10-CM | POA: Diagnosis not present

## 2021-06-23 DIAGNOSIS — D1801 Hemangioma of skin and subcutaneous tissue: Secondary | ICD-10-CM | POA: Diagnosis not present

## 2021-06-23 DIAGNOSIS — Z85828 Personal history of other malignant neoplasm of skin: Secondary | ICD-10-CM | POA: Diagnosis not present

## 2021-06-23 DIAGNOSIS — D692 Other nonthrombocytopenic purpura: Secondary | ICD-10-CM | POA: Diagnosis not present

## 2021-06-23 DIAGNOSIS — L821 Other seborrheic keratosis: Secondary | ICD-10-CM | POA: Diagnosis not present

## 2021-06-23 DIAGNOSIS — L820 Inflamed seborrheic keratosis: Secondary | ICD-10-CM | POA: Diagnosis not present

## 2021-06-23 DIAGNOSIS — L918 Other hypertrophic disorders of the skin: Secondary | ICD-10-CM | POA: Diagnosis not present

## 2021-06-23 DIAGNOSIS — D2271 Melanocytic nevi of right lower limb, including hip: Secondary | ICD-10-CM | POA: Diagnosis not present

## 2021-06-23 DIAGNOSIS — L570 Actinic keratosis: Secondary | ICD-10-CM | POA: Diagnosis not present

## 2021-06-23 DIAGNOSIS — D485 Neoplasm of uncertain behavior of skin: Secondary | ICD-10-CM | POA: Diagnosis not present

## 2021-07-06 DIAGNOSIS — N189 Chronic kidney disease, unspecified: Secondary | ICD-10-CM | POA: Diagnosis not present

## 2021-07-06 DIAGNOSIS — N1832 Chronic kidney disease, stage 3b: Secondary | ICD-10-CM | POA: Diagnosis not present

## 2021-07-15 DIAGNOSIS — E870 Hyperosmolality and hypernatremia: Secondary | ICD-10-CM | POA: Diagnosis not present

## 2021-07-15 DIAGNOSIS — I480 Paroxysmal atrial fibrillation: Secondary | ICD-10-CM | POA: Diagnosis not present

## 2021-07-15 DIAGNOSIS — D631 Anemia in chronic kidney disease: Secondary | ICD-10-CM | POA: Diagnosis not present

## 2021-07-15 DIAGNOSIS — N400 Enlarged prostate without lower urinary tract symptoms: Secondary | ICD-10-CM | POA: Diagnosis not present

## 2021-07-15 DIAGNOSIS — N1832 Chronic kidney disease, stage 3b: Secondary | ICD-10-CM | POA: Diagnosis not present

## 2021-07-15 DIAGNOSIS — I129 Hypertensive chronic kidney disease with stage 1 through stage 4 chronic kidney disease, or unspecified chronic kidney disease: Secondary | ICD-10-CM | POA: Diagnosis not present

## 2021-07-15 DIAGNOSIS — N2581 Secondary hyperparathyroidism of renal origin: Secondary | ICD-10-CM | POA: Diagnosis not present

## 2021-07-15 DIAGNOSIS — N281 Cyst of kidney, acquired: Secondary | ICD-10-CM | POA: Diagnosis not present

## 2021-07-16 NOTE — Progress Notes (Signed)
? ? ?Office Visit  ?  ?Patient Name: [name] ?Date of Encounter: 07/16/2021 ? ?Primary Care Provider:  Lavone Orn, MD ?Primary Cardiologist:  None ? ?Chief Complaint  ?  ?6 month follow up  ? ?History of Present Illness  ?  ?[name] is a 81 y.o. male seen today for Will Meredith Leeds, MD . He has a PMH of AF 2016, HTN, HLD, TIA,CKD III, GERD, CVA, left carotid endarterectomy 4/22. CHA2DS2-VASc of 6 on Eliquis and aspirin.  Carotid duplex completed 3/22 for surveillance.  Last echo performed 12/19 with EF 50 to 55%, LVH hypertrophy, grade 1 diastolic dysfunction.  He has been rate controlled on Cardizem with no increase in dose since last office visit ? ?He is here alone today for his office visit because his wife is out of town. The patient reports doing well with current complaints. On 09/10/20 he had a redo of his left carotid endarterectomy that went well with any complications. He denies chest pain, palpitations, dyspnea, PND, orthopnea, nausea, vomiting, dizziness, syncope, edema, weight gain, or early satiety. He has been compliant with all of his medications and has no bleeding issues with his Eliquis.Regarding his AF he has not felt his heart out of rhythm since his last office visit.  ? ? ? ? ?Past Medical History  ?  ?Past Medical History:  ?Diagnosis Date  ? Anemia 10/30/19,11/06/19  ? iron infusions at [facility]  ? Atrial fibrillation ([place]) 02/2016  ? NEW ONSET  ? Brain tumor (benign) ([place]) 1998  ? 2 small tumors- since 2015 (Dr. [name] following)  ? Carotid artery occlusion   ? Cerebrovascular disease September 09, 2001  ? TIA, Left brain  ? CKD (chronic kidney disease), stage III ([place])   ? Dementia ([place])   ? Diverticulosis   ? Dysrhythmia   ? Afib  ? ED (erectile dysfunction)   ? GERD (gastroesophageal reflux disease)   ? Guillain-Barre syndrome ([place]) [id number]  ? Hx of elevated lipids   ? Hyperlipidemia   ? Hypertension   ? Memory loss   ? Stroke [facility]) [phone]  ? ?Past Surgical History:   ?Procedure Laterality Date  ? BRAIN SURGERY  1998  ? Benign brain tumor removed, Left hemisphere- ? Meningioma  ? CAROTID ENDARTERECTOMY Left September 13, 2001  ? LEFT cea  ? CHOLECYSTECTOMY  Nov. 2001  ? COLON SURGERY  Feb. 2003  ? Colonic polyps removed endoscopically  ? COLONOSCOPY WITH PROPOFOL N/A 11/11/2015  ? Procedure: COLONOSCOPY WITH PROPOFOL;  Surgeon: Garlan Fair, MD;  Location: [facility];  Service: Endoscopy;  Laterality: N/A;  ? ENDARTERECTOMY Left 09/10/2020  ? Procedure: REDO LEFT CAROTID ARTERY ENDARTERECTOMY;  Surgeon: Rosetta Posner, MD;  Location: [facility] OR;  Service: Vascular;  Laterality: Left;  ? EYE SURGERY    ? cataracts  ? INGUINAL HERNIA REPAIR  1970's  ? TONSILLECTOMY    ? TOTAL KNEE ARTHROPLASTY Right 12/03/2019  ? Procedure: RIGHT TOTAL KNEE ARTHROPLASTY;  Surgeon: Frederik Pear, MD;  Location: [facility] ORS;  Service: Orthopedics;  Laterality: Right;  ? ? ?Allergies ? ?Allergies  ?Allergen Reactions  ? Immune Globulins Other (See Comments)  ?  Tetanus Immunes Globulins    (  Pt. Cannot take the Flu shot ) ?[name]   ? Influenza Vaccines Other (See Comments)  ?  [name] ?[name]  ? Rho (D) Immune Globulin Other (See Comments)  ?  Tetanus Immunes Globulins    (  Pt. Cannot  take the Flu shot ) ?[name]   ? ? ?Home Medications  ?  ?Current Outpatient Medications  ?Medication Sig Dispense Refill  ? acetaminophen (TYLENOL) 500 MG tablet Take 1,000 mg by mouth every 8 (eight) hours as needed for moderate pain.    ? apixaban (ELIQUIS) 2.5 MG TABS tablet Take 1 tablet (2.5 mg total) by mouth 2 (two) times daily. Please keep upcoming Appt with Cardiologist. Thanks. 60 tablet 0  ? aspirin 81 MG chewable tablet Chew 1 tablet (81 mg total) by mouth daily. 30 tablet 0  ? Cholecalciferol (VITAMIN D) 50 MCG (2000 UT) CAPS Take 2,000 Units by mouth daily.     ? DILT-XR 180 MG 24 hr capsule Take 180 mg by mouth daily.    ? docusate sodium (COLACE) 100 MG capsule 1 capsule    ?  HYDROcodone-acetaminophen (NORCO/VICODIN) 5-325 MG tablet Take 1 tablet by mouth every 4 (four) hours as needed. (Patient not taking: Reported on 01/05/2021)    ? lisinopril-hydrochlorothiazide (PRINZIDE,ZESTORETIC) 10-12.5 MG tablet Take 0.5 tablets by mouth daily.     ? oxyCODONE-acetaminophen (PERCOCET/ROXICET) 5-325 MG tablet 1 tablet as needed for severe pain (Patient not taking: Reported on 01/05/2021)    ? pantoprazole (PROTONIX) 40 MG tablet Take 40 mg by mouth daily.    ? penicillin v potassium (VEETID) 500 MG tablet Take 500 mg by mouth 4 (four) times daily. (Patient not taking: Reported on 01/05/2021)    ? rosuvastatin (CRESTOR) 20 MG tablet Take 20 mg by mouth every evening.     ? vitamin B-12 (CYANOCOBALAMIN) 1000 MCG tablet Take 1,000 mcg by mouth 2 (two) times a week. Wednesday and Saturday    ? ?No current facility-administered medications for this visit.  ?  ? ?Review of Systems  ?All other systems reviewed and are otherwise negative except as noted above. ? ? ?Physical Exam  ?  ?YF:VCBSW were no vitals filed for this visit.  ? ?GEN: Well nourished, well developed, in no acute distress. ?Neck: Supple, no JVD, carotid bruits, or masses. ?Cardiac: RRR, no murmurs, rubs, or gallops. No clubbing, cyanosis, edema.  Radials/PT 2+ and equal bilaterally.  ?Respiratory:  Respirations regular and unlabored, clear to auscultation bilaterally. ?MS: no deformity or atrophy. ?Skin: warm and dry, no rash. ?Neuro:  Strength and sensation are intact. ?Psych: Normal affect. ? ?Accessory Clinical Findings  ?  ?ECG personally reviewed by me today - Sinus rythm with PAC's and a rate of 67- no acute changes. ? ?Lab Results  ?Component Value Date  ? WBC 16.0 (H) 09/11/2020  ? HGB 10.4 (L) 09/11/2020  ? HCT 35.1 (L) 09/11/2020  ? MCV 85.6 09/11/2020  ? PLT 148 (L) 09/11/2020  ? ?Lab Results  ?Component Value Date  ? CREATININE 2.29 (H) 09/11/2020  ? BUN 28 (H) 09/11/2020  ? NA 139 09/11/2020  ? K 4.6 09/11/2020  ? CL 110  09/11/2020  ? CO2 20 (L) 09/11/2020  ? ?Lab Results  ?Component Value Date  ? ALT 16 09/09/2020  ? AST 21 09/09/2020  ? ALKPHOS 121 09/09/2020  ? BILITOT 1.0 09/09/2020  ? ?Lab Results  ?Component Value Date  ? CHOL 101 09/11/2020  ? HDL 27 (L) 09/11/2020  ? [place] 51 09/11/2020  ? TRIG 116 09/11/2020  ? CHOLHDL 3.7 09/11/2020  ?  ?Lab Results  ?Component Value Date  ? HGBA1C 6.1 (H) 05/05/2018  ? ? ?Assessment & Plan  ?  ?1. Paroxysmal A-fib: ?-Continue Eliquis and ASA 81 mg ?-  CHA2DS2-VASc is 6 ?-We will obtain is current lab work from PCP ? ?2. Hypertension: ?-B/P well-controlled today at 138/60 ?-Continue Zestoretic 10-12.5 mg ?-Continue Diltiazem XR 180 mg ? ?3. Hyperlipidemia: ?-Last LDL: 51 ?-Followed by PCP ? ?Disposition: Follow-up with Dr.Camnitz  in 6 month/year ? ? ?Medication Adjustments/Labs and Tests Ordered: ?Current medicines are reviewed at length with the patient today.  Concerns regarding medicines are outlined above.  ?Tests Ordered: ?No orders of the defined types were placed in this encounter. ? ?Medication Changes: ?No orders of the defined types were placed in this encounter. ? ? ? ?Mable Fill, Marissa Nestle, NP ?07/16/2021, 2:07 PM ?    ?

## 2021-07-17 ENCOUNTER — Other Ambulatory Visit: Payer: Self-pay

## 2021-07-17 ENCOUNTER — Ambulatory Visit: Payer: PPO | Admitting: Nurse Practitioner

## 2021-07-17 ENCOUNTER — Encounter: Payer: Self-pay | Admitting: Nurse Practitioner

## 2021-07-17 VITALS — BP 138/60 | HR 67 | Ht 69.0 in | Wt 239.8 lb

## 2021-07-17 DIAGNOSIS — I10 Essential (primary) hypertension: Secondary | ICD-10-CM

## 2021-07-17 DIAGNOSIS — I480 Paroxysmal atrial fibrillation: Secondary | ICD-10-CM | POA: Diagnosis not present

## 2021-07-17 NOTE — Patient Instructions (Signed)
Medication Instructions:  ?Your physician recommends that you continue on your current medications as directed. Please refer to the Current Medication list given to you today. ? ?*If you need a refill on your cardiac medications before your next appointment, please call your pharmacy* ? ? ?Lab Work: ?None ?If you have labs (blood work) drawn today and your tests are completely normal, you will receive your results only by: ?MyChart Message (if you have MyChart) OR ?A paper copy in the mail ?If you have any lab test that is abnormal or we need to change your treatment, we will call you to review the results. ? ? ?Follow-Up: ?At [facility], you and your health needs are our priority.  As part of our continuing mission to provide you with exceptional heart care, we have created designated Provider Care Teams.  These Care Teams include your primary Cardiologist (physician) and Advanced Practice Providers (APPs -  Physician Assistants and Nurse Practitioners) who all work together to provide you with the care you need, when you need it. ? ?We recommend signing up for the patient portal called "MyChart".  Sign up information is provided on this After Visit Summary.  MyChart is used to connect with patients for Virtual Visits (Telemedicine).  Patients are able to view lab/test results, encounter notes, upcoming appointments, etc.  Non-urgent messages can be sent to your provider as well.   ?To learn more about what you can do with MyChart, go to [email or web address].   ? ?Your next appointment:   ?6 month(s) ? ?The format for your next appointment:   ?In Person ? ?Provider:   ?Allegra Lai, MD  ?  ?

## 2021-07-20 ENCOUNTER — Other Ambulatory Visit: Payer: Self-pay | Admitting: Cardiology

## 2021-07-20 DIAGNOSIS — I4891 Unspecified atrial fibrillation: Secondary | ICD-10-CM

## 2021-07-20 NOTE — Telephone Encounter (Signed)
Eliquis 2.'5mg'$  refill request received. Patient is 81 years old, weight-108.8kg, Crea-1.75 on 07/06/2021 via [facility] from [employer], [place], and last seen by Ambrose Pancoast, NP on 07/17/2021. Dose is appropriate based on dosing criteria. Will send in refill to requested pharmacy.   ?

## 2021-07-23 DIAGNOSIS — U071 COVID-19: Secondary | ICD-10-CM | POA: Diagnosis not present

## 2021-07-24 ENCOUNTER — Ambulatory Visit: Payer: PPO | Admitting: Podiatry

## 2021-07-31 ENCOUNTER — Inpatient Hospital Stay (HOSPITAL_COMMUNITY)
Admission: EM | Admit: 2021-07-31 | Discharge: 2021-08-15 | DRG: 871 | Disposition: E | Payer: PPO | Attending: Pulmonary Disease | Admitting: Pulmonary Disease

## 2021-07-31 ENCOUNTER — Other Ambulatory Visit: Payer: Self-pay

## 2021-07-31 ENCOUNTER — Encounter (HOSPITAL_COMMUNITY): Payer: Self-pay | Admitting: *Deleted

## 2021-07-31 ENCOUNTER — Emergency Department (HOSPITAL_COMMUNITY): Payer: PPO

## 2021-07-31 DIAGNOSIS — D65 Disseminated intravascular coagulation [defibrination syndrome]: Secondary | ICD-10-CM | POA: Diagnosis present

## 2021-07-31 DIAGNOSIS — I517 Cardiomegaly: Secondary | ICD-10-CM | POA: Diagnosis not present

## 2021-07-31 DIAGNOSIS — J159 Unspecified bacterial pneumonia: Secondary | ICD-10-CM | POA: Diagnosis not present

## 2021-07-31 DIAGNOSIS — J9690 Respiratory failure, unspecified, unspecified whether with hypoxia or hypercapnia: Secondary | ICD-10-CM | POA: Diagnosis not present

## 2021-07-31 DIAGNOSIS — Z823 Family history of stroke: Secondary | ICD-10-CM

## 2021-07-31 DIAGNOSIS — I82443 Acute embolism and thrombosis of tibial vein, bilateral: Secondary | ICD-10-CM | POA: Diagnosis present

## 2021-07-31 DIAGNOSIS — F0390 Unspecified dementia without behavioral disturbance: Secondary | ICD-10-CM | POA: Diagnosis not present

## 2021-07-31 DIAGNOSIS — R7989 Other specified abnormal findings of blood chemistry: Secondary | ICD-10-CM | POA: Diagnosis present

## 2021-07-31 DIAGNOSIS — N390 Urinary tract infection, site not specified: Secondary | ICD-10-CM | POA: Diagnosis not present

## 2021-07-31 DIAGNOSIS — J9601 Acute respiratory failure with hypoxia: Secondary | ICD-10-CM

## 2021-07-31 DIAGNOSIS — A419 Sepsis, unspecified organism: Secondary | ICD-10-CM | POA: Diagnosis not present

## 2021-07-31 DIAGNOSIS — E860 Dehydration: Secondary | ICD-10-CM | POA: Diagnosis not present

## 2021-07-31 DIAGNOSIS — J1282 Pneumonia due to coronavirus disease 2019: Secondary | ICD-10-CM | POA: Diagnosis present

## 2021-07-31 DIAGNOSIS — I743 Embolism and thrombosis of arteries of the lower extremities: Secondary | ICD-10-CM | POA: Diagnosis present

## 2021-07-31 DIAGNOSIS — Z887 Allergy status to serum and vaccine status: Secondary | ICD-10-CM

## 2021-07-31 DIAGNOSIS — R7982 Elevated C-reactive protein (CRP): Secondary | ICD-10-CM | POA: Diagnosis present

## 2021-07-31 DIAGNOSIS — M7989 Other specified soft tissue disorders: Secondary | ICD-10-CM | POA: Diagnosis not present

## 2021-07-31 DIAGNOSIS — R6521 Severe sepsis with septic shock: Secondary | ICD-10-CM | POA: Diagnosis not present

## 2021-07-31 DIAGNOSIS — E785 Hyperlipidemia, unspecified: Secondary | ICD-10-CM | POA: Diagnosis present

## 2021-07-31 DIAGNOSIS — Z4682 Encounter for fitting and adjustment of non-vascular catheter: Secondary | ICD-10-CM | POA: Diagnosis not present

## 2021-07-31 DIAGNOSIS — Z86010 Personal history of colonic polyps: Secondary | ICD-10-CM

## 2021-07-31 DIAGNOSIS — U071 COVID-19: Secondary | ICD-10-CM | POA: Diagnosis present

## 2021-07-31 DIAGNOSIS — Z888 Allergy status to other drugs, medicaments and biological substances status: Secondary | ICD-10-CM

## 2021-07-31 DIAGNOSIS — Z800 Family history of malignant neoplasm of digestive organs: Secondary | ICD-10-CM

## 2021-07-31 DIAGNOSIS — Z7901 Long term (current) use of anticoagulants: Secondary | ICD-10-CM

## 2021-07-31 DIAGNOSIS — E872 Acidosis, unspecified: Secondary | ICD-10-CM | POA: Diagnosis present

## 2021-07-31 DIAGNOSIS — R000 Tachycardia, unspecified: Secondary | ICD-10-CM | POA: Diagnosis not present

## 2021-07-31 DIAGNOSIS — Z66 Do not resuscitate: Secondary | ICD-10-CM | POA: Diagnosis not present

## 2021-07-31 DIAGNOSIS — I4821 Permanent atrial fibrillation: Secondary | ICD-10-CM | POA: Diagnosis present

## 2021-07-31 DIAGNOSIS — N179 Acute kidney failure, unspecified: Secondary | ICD-10-CM | POA: Diagnosis present

## 2021-07-31 DIAGNOSIS — I639 Cerebral infarction, unspecified: Secondary | ICD-10-CM | POA: Diagnosis present

## 2021-07-31 DIAGNOSIS — A4181 Sepsis due to Enterococcus: Secondary | ICD-10-CM | POA: Diagnosis not present

## 2021-07-31 DIAGNOSIS — I82453 Acute embolism and thrombosis of peroneal vein, bilateral: Secondary | ICD-10-CM | POA: Diagnosis present

## 2021-07-31 DIAGNOSIS — N184 Chronic kidney disease, stage 4 (severe): Secondary | ICD-10-CM | POA: Diagnosis not present

## 2021-07-31 DIAGNOSIS — Z9049 Acquired absence of other specified parts of digestive tract: Secondary | ICD-10-CM

## 2021-07-31 DIAGNOSIS — Z86011 Personal history of benign neoplasm of the brain: Secondary | ICD-10-CM

## 2021-07-31 DIAGNOSIS — K219 Gastro-esophageal reflux disease without esophagitis: Secondary | ICD-10-CM | POA: Diagnosis present

## 2021-07-31 DIAGNOSIS — I959 Hypotension, unspecified: Secondary | ICD-10-CM | POA: Diagnosis not present

## 2021-07-31 DIAGNOSIS — R197 Diarrhea, unspecified: Secondary | ICD-10-CM | POA: Diagnosis present

## 2021-07-31 DIAGNOSIS — Z87891 Personal history of nicotine dependence: Secondary | ICD-10-CM

## 2021-07-31 DIAGNOSIS — Z28310 Unvaccinated for covid-19: Secondary | ICD-10-CM

## 2021-07-31 DIAGNOSIS — I129 Hypertensive chronic kidney disease with stage 1 through stage 4 chronic kidney disease, or unspecified chronic kidney disease: Secondary | ICD-10-CM | POA: Diagnosis not present

## 2021-07-31 DIAGNOSIS — I82413 Acute embolism and thrombosis of femoral vein, bilateral: Secondary | ICD-10-CM | POA: Diagnosis not present

## 2021-07-31 DIAGNOSIS — I4891 Unspecified atrial fibrillation: Secondary | ICD-10-CM | POA: Diagnosis not present

## 2021-07-31 DIAGNOSIS — Z79899 Other long term (current) drug therapy: Secondary | ICD-10-CM

## 2021-07-31 DIAGNOSIS — I491 Atrial premature depolarization: Secondary | ICD-10-CM | POA: Diagnosis not present

## 2021-07-31 DIAGNOSIS — Z515 Encounter for palliative care: Secondary | ICD-10-CM

## 2021-07-31 DIAGNOSIS — J80 Acute respiratory distress syndrome: Secondary | ICD-10-CM | POA: Diagnosis not present

## 2021-07-31 DIAGNOSIS — I10 Essential (primary) hypertension: Secondary | ICD-10-CM | POA: Diagnosis present

## 2021-07-31 DIAGNOSIS — I82433 Acute embolism and thrombosis of popliteal vein, bilateral: Secondary | ICD-10-CM | POA: Diagnosis present

## 2021-07-31 DIAGNOSIS — J1289 Other viral pneumonia: Secondary | ICD-10-CM | POA: Diagnosis not present

## 2021-07-31 DIAGNOSIS — G9341 Metabolic encephalopathy: Secondary | ICD-10-CM | POA: Diagnosis not present

## 2021-07-31 DIAGNOSIS — R0902 Hypoxemia: Secondary | ICD-10-CM | POA: Diagnosis not present

## 2021-07-31 DIAGNOSIS — I998 Other disorder of circulatory system: Secondary | ICD-10-CM | POA: Diagnosis not present

## 2021-07-31 DIAGNOSIS — R0689 Other abnormalities of breathing: Secondary | ICD-10-CM | POA: Diagnosis not present

## 2021-07-31 DIAGNOSIS — Z833 Family history of diabetes mellitus: Secondary | ICD-10-CM

## 2021-07-31 DIAGNOSIS — R609 Edema, unspecified: Secondary | ICD-10-CM | POA: Diagnosis not present

## 2021-07-31 DIAGNOSIS — Z96651 Presence of right artificial knee joint: Secondary | ICD-10-CM | POA: Diagnosis present

## 2021-07-31 DIAGNOSIS — Z452 Encounter for adjustment and management of vascular access device: Secondary | ICD-10-CM | POA: Diagnosis not present

## 2021-07-31 DIAGNOSIS — R059 Cough, unspecified: Secondary | ICD-10-CM | POA: Diagnosis not present

## 2021-07-31 DIAGNOSIS — Z7982 Long term (current) use of aspirin: Secondary | ICD-10-CM

## 2021-07-31 DIAGNOSIS — R918 Other nonspecific abnormal finding of lung field: Secondary | ICD-10-CM | POA: Diagnosis not present

## 2021-07-31 DIAGNOSIS — R6520 Severe sepsis without septic shock: Secondary | ICD-10-CM | POA: Diagnosis not present

## 2021-07-31 DIAGNOSIS — Z8673 Personal history of transient ischemic attack (TIA), and cerebral infarction without residual deficits: Secondary | ICD-10-CM

## 2021-07-31 DIAGNOSIS — I70223 Atherosclerosis of native arteries of extremities with rest pain, bilateral legs: Secondary | ICD-10-CM | POA: Diagnosis present

## 2021-07-31 DIAGNOSIS — N170 Acute kidney failure with tubular necrosis: Secondary | ICD-10-CM | POA: Diagnosis not present

## 2021-07-31 LAB — BRAIN NATRIURETIC PEPTIDE: B Natriuretic Peptide: 168 pg/mL — ABNORMAL HIGH (ref 0.0–100.0)

## 2021-07-31 LAB — CBC WITH DIFFERENTIAL/PLATELET
Abs Immature Granulocytes: 0.29 10*3/uL — ABNORMAL HIGH (ref 0.00–0.07)
Basophils Absolute: 0.1 10*3/uL (ref 0.0–0.1)
Basophils Relative: 0 %
Eosinophils Absolute: 0 10*3/uL (ref 0.0–0.5)
Eosinophils Relative: 0 %
HCT: 49 % (ref 39.0–52.0)
Hemoglobin: 15.5 g/dL (ref 13.0–17.0)
Immature Granulocytes: 1 %
Lymphocytes Relative: 3 %
Lymphs Abs: 0.7 10*3/uL (ref 0.7–4.0)
MCH: 28.9 pg (ref 26.0–34.0)
MCHC: 31.6 g/dL (ref 30.0–36.0)
MCV: 91.2 fL (ref 80.0–100.0)
Monocytes Absolute: 0.7 10*3/uL (ref 0.1–1.0)
Monocytes Relative: 3 %
Neutro Abs: 24.2 10*3/uL — ABNORMAL HIGH (ref 1.7–7.7)
Neutrophils Relative %: 93 %
Platelets: 235 10*3/uL (ref 150–400)
RBC: 5.37 MIL/uL (ref 4.22–5.81)
RDW: 15.4 % (ref 11.5–15.5)
WBC: 26 10*3/uL — ABNORMAL HIGH (ref 4.0–10.5)
nRBC: 0 % (ref 0.0–0.2)

## 2021-07-31 LAB — URINALYSIS, ROUTINE W REFLEX MICROSCOPIC
Bacteria, UA: NONE SEEN
Bilirubin Urine: NEGATIVE
Glucose, UA: NEGATIVE mg/dL
Hgb urine dipstick: NEGATIVE
Ketones, ur: NEGATIVE mg/dL
Leukocytes,Ua: NEGATIVE
Nitrite: NEGATIVE
Protein, ur: 30 mg/dL — AB
Specific Gravity, Urine: 1.024 (ref 1.005–1.030)
pH: 5 (ref 5.0–8.0)

## 2021-07-31 LAB — COMPREHENSIVE METABOLIC PANEL
ALT: 37 U/L (ref 0–44)
AST: 56 U/L — ABNORMAL HIGH (ref 15–41)
Albumin: 2.8 g/dL — ABNORMAL LOW (ref 3.5–5.0)
Alkaline Phosphatase: 103 U/L (ref 38–126)
Anion gap: 17 — ABNORMAL HIGH (ref 5–15)
BUN: 70 mg/dL — ABNORMAL HIGH (ref 8–23)
CO2: 20 mmol/L — ABNORMAL LOW (ref 22–32)
Calcium: 8 mg/dL — ABNORMAL LOW (ref 8.9–10.3)
Chloride: 102 mmol/L (ref 98–111)
Creatinine, Ser: 3.93 mg/dL — ABNORMAL HIGH (ref 0.61–1.24)
GFR, Estimated: 15 mL/min — ABNORMAL LOW (ref >60)
Glucose, Bld: 151 mg/dL — ABNORMAL HIGH (ref 70–99)
Potassium: 4.2 mmol/L (ref 3.5–5.1)
Sodium: 139 mmol/L (ref 135–145)
Total Bilirubin: 1.1 mg/dL (ref 0.3–1.2)
Total Protein: 7.1 g/dL (ref 6.5–8.1)

## 2021-07-31 LAB — D-DIMER, QUANTITATIVE: D-Dimer, Quant: 1.27 ug/mL-FEU — ABNORMAL HIGH (ref 0.00–0.50)

## 2021-07-31 LAB — C-REACTIVE PROTEIN: CRP: 42.2 mg/dL — ABNORMAL HIGH (ref <1.0)

## 2021-07-31 LAB — RESP PANEL BY RT-PCR (FLU A&B, COVID) ARPGX2
Influenza A by PCR: NEGATIVE
Influenza B by PCR: NEGATIVE
SARS Coronavirus 2 by RT PCR: POSITIVE — AB

## 2021-07-31 LAB — PROCALCITONIN: Procalcitonin: 1.74 ng/mL

## 2021-07-31 LAB — FERRITIN: Ferritin: 213 ng/mL (ref 24–336)

## 2021-07-31 LAB — LACTATE DEHYDROGENASE: LDH: 465 U/L — ABNORMAL HIGH (ref 98–192)

## 2021-07-31 LAB — FIBRINOGEN: Fibrinogen: >800 mg/dL — ABNORMAL HIGH (ref 210–475)

## 2021-07-31 LAB — MRSA NEXT GEN BY PCR, NASAL: MRSA by PCR Next Gen: NOT DETECTED

## 2021-07-31 LAB — TRIGLYCERIDES: Triglycerides: 127 mg/dL (ref <150)

## 2021-07-31 LAB — LACTIC ACID, PLASMA
Lactic Acid, Venous: 4.1 mmol/L (ref 0.5–1.9)
Lactic Acid, Venous: 3.9 mmol/L (ref 0.5–1.9)

## 2021-07-31 MED ORDER — SODIUM CHLORIDE 0.9 % IV SOLN
500.0000 mg | INTRAVENOUS | Status: DC
  Administered 2021-07-31 – 2021-08-02 (×3): 500 mg via INTRAVENOUS
  Filled 2021-07-31 (×3): qty 5

## 2021-07-31 MED ORDER — SODIUM CHLORIDE 0.9 % IV BOLUS: 500.0000 mL | Freq: Once | INTRAVENOUS | Status: DC

## 2021-07-31 MED ORDER — GUAIFENESIN-DM 100-10 MG/5ML PO SYRP: 10.0000 mL | ORAL_SOLUTION | ORAL | Status: DC | PRN

## 2021-07-31 MED ORDER — ZINC SULFATE 220 (50 ZN) MG PO CAPS
220.0000 mg | ORAL_CAPSULE | Freq: Every day | ORAL | Status: DC
  Administered 2021-07-31: 220 mg via ORAL
  Filled 2021-07-31 (×2): qty 1

## 2021-07-31 MED ORDER — METHYLPREDNISOLONE SODIUM SUCC 125 MG IJ SOLR
1.0000 mg/kg | Freq: Two times a day (BID) | INTRAMUSCULAR | Status: DC
  Administered 2021-07-31 – 2021-08-02 (×4): 108.75 mg via INTRAVENOUS
  Filled 2021-07-31 (×4): qty 2

## 2021-07-31 MED ORDER — ROSUVASTATIN CALCIUM 20 MG PO TABS: 20.0000 mg | ORAL_TABLET | Freq: Every evening | ORAL | Status: DC

## 2021-07-31 MED ORDER — HYDROCOD POLI-CHLORPHE POLI ER 10-8 MG/5ML PO SUER: 5.0000 mL | Freq: Two times a day (BID) | ORAL | Status: DC | PRN

## 2021-07-31 MED ORDER — ONDANSETRON HCL 4 MG/2ML IJ SOLN: 4.0000 mg | Freq: Four times a day (QID) | INTRAMUSCULAR | Status: DC | PRN

## 2021-07-31 MED ORDER — SODIUM CHLORIDE 0.9 % IV SOLN
100.0000 mg | Freq: Every day | INTRAVENOUS | Status: DC
  Administered 2021-08-01: 100 mg via INTRAVENOUS
  Filled 2021-07-31 (×3): qty 20

## 2021-07-31 MED ORDER — SODIUM CHLORIDE 0.9 % IV SOLN
100.0000 mg | INTRAVENOUS | Status: AC
  Administered 2021-07-31 (×2): 100 mg via INTRAVENOUS
  Filled 2021-07-31 (×2): qty 20

## 2021-07-31 MED ORDER — DOCUSATE SODIUM 100 MG PO CAPS
100.0000 mg | ORAL_CAPSULE | Freq: Every day | ORAL | Status: DC
  Filled 2021-07-31: qty 1

## 2021-07-31 MED ORDER — ORAL CARE MOUTH RINSE
15.0000 mL | Freq: Two times a day (BID) | OROMUCOSAL | Status: DC
  Administered 2021-07-31 – 2021-08-01 (×2): 15 mL via OROMUCOSAL

## 2021-07-31 MED ORDER — SODIUM CHLORIDE 0.9 % IV BOLUS
500.0000 mL | Freq: Once | INTRAVENOUS | Status: AC
  Administered 2021-07-31: 500 mL via INTRAVENOUS

## 2021-07-31 MED ORDER — CHLORHEXIDINE GLUCONATE CLOTH 2 % EX PADS
6.0000 | MEDICATED_PAD | Freq: Every day | CUTANEOUS | Status: DC
  Administered 2021-08-01 – 2021-08-02 (×2): 6 via TOPICAL

## 2021-07-31 MED ORDER — LACTATED RINGERS IV BOLUS (SEPSIS)
1000.0000 mL | Freq: Once | INTRAVENOUS | Status: AC
  Administered 2021-07-31: 1000 mL via INTRAVENOUS

## 2021-07-31 MED ORDER — ASCORBIC ACID 500 MG PO TABS
500.0000 mg | ORAL_TABLET | Freq: Every day | ORAL | Status: DC
  Administered 2021-07-31: 500 mg via ORAL
  Filled 2021-07-31 (×2): qty 1

## 2021-07-31 MED ORDER — PREDNISONE 20 MG PO TABS: 50.0000 mg | ORAL_TABLET | Freq: Every day | ORAL | Status: DC

## 2021-07-31 MED ORDER — METOPROLOL TARTRATE 5 MG/5ML IV SOLN
5.0000 mg | Freq: Four times a day (QID) | INTRAVENOUS | Status: DC | PRN
  Administered 2021-08-01 – 2021-08-02 (×2): 5 mg via INTRAVENOUS
  Filled 2021-07-31 (×2): qty 5

## 2021-07-31 MED ORDER — SODIUM CHLORIDE 0.9 % IV SOLN
2.0000 g | INTRAVENOUS | Status: DC
  Administered 2021-07-31 – 2021-08-02 (×3): 2 g via INTRAVENOUS
  Filled 2021-07-31 (×3): qty 20

## 2021-07-31 MED ORDER — LACTATED RINGERS IV SOLN: INTRAVENOUS | Status: AC

## 2021-07-31 MED ORDER — PANTOPRAZOLE SODIUM 40 MG PO TBEC
40.0000 mg | DELAYED_RELEASE_TABLET | Freq: Every day | ORAL | Status: DC
  Filled 2021-07-31: qty 1

## 2021-07-31 MED ORDER — ONDANSETRON HCL 4 MG PO TABS: 4.0000 mg | ORAL_TABLET | Freq: Four times a day (QID) | ORAL | Status: DC | PRN

## 2021-07-31 MED ORDER — APIXABAN 2.5 MG PO TABS
2.5000 mg | ORAL_TABLET | Freq: Two times a day (BID) | ORAL | Status: DC
  Administered 2021-07-31 (×2): 2.5 mg via ORAL
  Filled 2021-07-31 (×4): qty 1

## 2021-07-31 MED ORDER — LACTATED RINGERS IV BOLUS
1000.0000 mL | Freq: Once | INTRAVENOUS | Status: AC
  Administered 2021-07-31: 1000 mL via INTRAVENOUS

## 2021-07-31 MED ORDER — METHYLPREDNISOLONE SODIUM SUCC 125 MG IJ SOLR
125.0000 mg | Freq: Once | INTRAMUSCULAR | Status: AC
  Administered 2021-07-31: 125 mg via INTRAVENOUS
  Filled 2021-07-31: qty 2

## 2021-07-31 MED ORDER — VITAMIN B-12 1000 MCG PO TABS: 1000.0000 ug | ORAL_TABLET | ORAL | Status: DC

## 2021-07-31 MED ORDER — ALBUTEROL SULFATE HFA 108 (90 BASE) MCG/ACT IN AERS
2.0000 | INHALATION_SPRAY | Freq: Four times a day (QID) | RESPIRATORY_TRACT | Status: DC
  Administered 2021-07-31 (×2): 2 via RESPIRATORY_TRACT
  Filled 2021-07-31 (×2): qty 6.7

## 2021-07-31 MED ORDER — VITAMIN D 25 MCG (1000 UNIT) PO TABS
2000.0000 [IU] | ORAL_TABLET | Freq: Every day | ORAL | Status: DC
  Filled 2021-07-31: qty 2

## 2021-07-31 MED ORDER — SODIUM CHLORIDE 0.9 % IV BOLUS
500.0000 mL | Freq: Once | INTRAVENOUS | Status: AC
Start: 2021-07-31 — End: 2021-07-31
  Administered 2021-07-31: 500 mL via INTRAVENOUS

## 2021-07-31 MED ORDER — ASPIRIN 81 MG PO CHEW
81.0000 mg | CHEWABLE_TABLET | Freq: Every day | ORAL | Status: DC
  Filled 2021-07-31: qty 1

## 2021-07-31 MED ORDER — ACETAMINOPHEN 325 MG PO TABS: 650.0000 mg | ORAL_TABLET | Freq: Four times a day (QID) | ORAL | Status: DC | PRN

## 2021-07-31 NOTE — Assessment & Plan Note (Addendum)
Patient has much worsening renal function due to sepsis.  Continue IV fluids for now. ?

## 2021-07-31 NOTE — Plan of Care (Addendum)
Patient admitted to CCU with COVID-19. Patient still requiring NRB. Patient is hypotensive and receiving fluid bolus at time of writing. Remains on airborne/contact isolation.  ? ?Edit at 0040 hrs: BiPAP initiated per order by RT. For SPO2 at 80% at reset. Patient with an episode of desaturation as low as 45% SPO2. Patient now resting comfortably on BiPAP. ? ?Problem: Education: ?Goal: Knowledge of risk factors and measures for prevention of condition will improve ?Outcome: Progressing ?  ?Problem: Coping: ?Goal: Psychosocial and spiritual needs will be supported ?Outcome: Progressing ?  ?Problem: Respiratory: ?Goal: Will maintain a patent airway ?Outcome: Progressing ?Goal: Complications related to the disease process, condition or treatment will be avoided or minimized ?Outcome: Progressing ?  ?Problem: Education: ?Goal: Knowledge of General Education information will improve ?Description: Including pain rating scale, medication(s)/side effects and non-pharmacologic comfort measures ?Outcome: Progressing ?  ?Problem: Health Behavior/Discharge Planning: ?Goal: Ability to manage health-related needs will improve ?Outcome: Progressing ?  ?Problem: Clinical Measurements: ?Goal: Ability to maintain clinical measurements within normal limits will improve ?Outcome: Progressing ?Goal: Will remain free from infection ?Outcome: Progressing ?Goal: Diagnostic test results will improve ?Outcome: Progressing ?Goal: Respiratory complications will improve ?Outcome: Progressing ?Goal: Cardiovascular complication will be avoided ?Outcome: Progressing ?  ?Problem: Activity: ?Goal: Risk for activity intolerance will decrease ?Outcome: Progressing ?  ?Problem: Nutrition: ?Goal: Adequate nutrition will be maintained ?Outcome: Progressing ?  ?Problem: Coping: ?Goal: Level of anxiety will decrease ?Outcome: Progressing ?  ?Problem: Elimination: ?Goal: Will not experience complications related to bowel motility ?Outcome:  Progressing ?Goal: Will not experience complications related to urinary retention ?Outcome: Progressing ?  ?Problem: Pain Managment: ?Goal: General experience of comfort will improve ?Outcome: Progressing ?  ?Problem: Safety: ?Goal: Ability to remain free from injury will improve ?Outcome: Progressing ?  ?Problem: Skin Integrity: ?Goal: Risk for impaired skin integrity will decrease ?Outcome: Progressing ?  ?

## 2021-07-31 NOTE — Assessment & Plan Note (Addendum)
Reviewed the patient chest x-ray image, significant bilateral pulmonary infiltrates, taking over at least 75% of the volume. ?Since admission to the hospital, patient has been requiring BiPAP with 80% oxygen.  He is treated with high-dose steroids, remdesivir.  Patient also has profound elevation of CRP at 42.2, he has inflammation storm. ?Due to severe respiratory failure, patient has a high risk for intubation.  I will give a dose of Actemra in addition to high-dose steroids. ?Patient has very mild BNP elevation with acute renal failure.  Not consistent with volume overload. ?Patient also has secondary bacterial pneumonia.  Will be treated with antibiotics. ?Patient will be followed closely in the ICU. ?

## 2021-07-31 NOTE — ED Notes (Signed)
ED TO INPATIENT HANDOFF REPORT ? ?ED Nurse Name and Phone #:  ? ?S ?Name/Age/Gender ?[name] ?81 y.o. ?male ?Room/Bed: APA02/APA02 ? ?Code Status ?  Code Status: Full Code ? ?Home/SNF/Other ?Home ?Patient oriented to: self, place, time, and situation ?Is this baseline? Yes  ? ?Triage Complete: Triage complete  ?Chief Complaint ?Acute hypoxemic respiratory failure due to COVID-19 ([place]) [U07.1, J96.01] ? ?Triage Note ?Pt brought in by [facility] from home with c/o weakness. Pt's O2 sat was noted to be 60% on RA, therefore pt was placed on NRB by EMS. BP 100/60, HR 130. Pt was diagnosed with Covid 1 week ago.   ? ?Allergies ?Allergies  ?Allergen Reactions  ? Immune Globulins Other (See Comments)  ?  Tetanus Immunes Globulins    (  Pt. Cannot take the Flu shot ) ?[name]   ? Influenza Vaccines Other (See Comments)  ?  [name] ?[name]  ? Rho (D) Immune Globulin Other (See Comments)  ?  Tetanus Immunes Globulins    (  Pt. Cannot take the Flu shot ) ?[name]   ? ? ?Level of Care/Admitting Diagnosis ?ED Disposition   ? ? ED Disposition  ?Admit  ? Condition  ?--  ? Comment  ?Hospital Area: [facility] [[id number]] ? Level of Care: Stepdown [14] ? Covid Evaluation: Symptomatic Person Under Investigation (PUI) or recent exposure (last 10 days) *Testing Required* ? Diagnosis: Acute hypoxemic respiratory failure due to COVID-19 [facility]) [[id number]] ? Admitting Physician: Heath Lark D [[id number]] ? Attending Physician: Heath Lark D [[id number]] ? Estimated length of stay: past midnight tomorrow ? Certification:: I certify this patient will need inpatient services for at least 2 midnights ?  ?  ? ?  ? ? ?B ?Medical/Surgery History ?Past Medical History:  ?Diagnosis Date  ? Anemia 10/30/19,11/06/19  ? iron infusions at [facility]  ? Atrial fibrillation ([place]) 02/2016  ? NEW ONSET  ? Brain tumor (benign) ([place]) 1998  ? 2 small tumors- since 2015 (Dr. [name] following)  ? Carotid artery occlusion   ?  Cerebrovascular disease September 09, 2001  ? TIA, Left brain  ? CKD (chronic kidney disease), stage III ([place])   ? Dementia ([place])   ? Diverticulosis   ? Dysrhythmia   ? Afib  ? ED (erectile dysfunction)   ? GERD (gastroesophageal reflux disease)   ? Guillain-Barre syndrome ([place]) [id number]  ? Hx of elevated lipids   ? Hyperlipidemia   ? Hypertension   ? Memory loss   ? Stroke [facility]) [phone]  ? ?Past Surgical History:  ?Procedure Laterality Date  ? BRAIN SURGERY  1998  ? Benign brain tumor removed, Left hemisphere- ? Meningioma  ? CAROTID ENDARTERECTOMY Left September 13, 2001  ? LEFT cea  ? CHOLECYSTECTOMY  Nov. 2001  ? COLON SURGERY  Feb. 2003  ? Colonic polyps removed endoscopically  ? COLONOSCOPY WITH PROPOFOL N/A 11/11/2015  ? Procedure: COLONOSCOPY WITH PROPOFOL;  Surgeon: Garlan Fair, MD;  [facility];  Service: Endoscopy;  Laterality: N/A;  ? ENDARTERECTOMY Left 09/10/2020  ? Procedure: REDO LEFT CAROTID ARTERY ENDARTERECTOMY;  Surgeon: Rosetta Posner, MD;  Location: [facility] OR;  Service: Vascular;  Laterality: Left;  ? EYE SURGERY    ? cataracts  ? INGUINAL HERNIA REPAIR  1970's  ? TONSILLECTOMY    ? TOTAL KNEE ARTHROPLASTY Right 12/03/2019  ? Procedure: RIGHT TOTAL KNEE ARTHROPLASTY;  Surgeon: Frederik Pear, MD;  [facility];  Service: Orthopedics;  Laterality: Right;  ?  ? ?  A ?IV Location/Drains/Wounds ?Patient Lines/Drains/Airways Status   ? ? Active Line/Drains/Airways   ? ? Name Placement date Placement time Site Days  ? Peripheral IV 07/19/2021 20 G Right Antecubital 08/05/2021  1155  Antecubital  less than 1  ? Peripheral IV 07/25/2021 22 G Right Wrist 08/09/2021  1314  Wrist  less than 1  ? Incision (Closed) 12/03/19 Knee Right 12/03/19  1331  -- 606  ? Incision (Closed) 09/10/20 Neck Left 09/10/20  0942  -- 324  ? ?  ?  ? ?  ? ? ?Intake/Output Last 24 hours ?No intake or output data in the 24 hours ending 07/27/2021 1512 ? ?Labs/Imaging ?Results for orders placed or performed during the hospital encounter  of 07/26/2021 (from the past 48 hour(s))  ?Resp Panel by RT-PCR (Flu A&B, Covid) Nasopharyngeal Swab     Status: Abnormal  ? Collection Time: 08/09/2021 11:55 AM  ? Specimen: Nasopharyngeal Swab; Nasopharyngeal(NP) swabs in vial transport medium  ?Result Value Ref Range  ? SARS Coronavirus 2 by RT PCR POSITIVE (A) NEGATIVE  ?  Comment: (NOTE) ?SARS-CoV-2 target nucleic acids are DETECTED. ? ?The SARS-CoV-2 RNA is generally detectable in upper respiratory ?specimens during the acute phase of infection. Positive results are ?indicative of the presence of the identified virus, but do not rule ?out bacterial infection or co-infection with other pathogens not ?detected by the test. Clinical correlation with patient history and ?other diagnostic information is necessary to determine patient ?infection status. The expected result is Negative. ? ?Fact Sheet for Patients: ?EntrepreneurPulse.com.au ? ?Fact Sheet for Healthcare Providers: ?IncredibleEmployment.be ? ?This test is not yet approved or cleared by the [place] FDA and  ?has been authorized for detection and/or diagnosis of SARS-CoV-2 by ?FDA under an Emergency Use Authorization (EUA).  This EUA will ?remain in effect (meaning this test can be used) for the duration of  ?the COVID-19 declaration under Section 564(b)(1) of the A ct, 21 ?U.S.C. section 360bbb-3(b)(1), unless the authorization is ?terminated or revoked sooner. ? ?  ? Influenza A by PCR NEGATIVE NEGATIVE  ? Influenza B by PCR NEGATIVE NEGATIVE  ?  Comment: (NOTE) ?The Xpert Xpress SARS-CoV-2/FLU/RSV plus assay is intended as an aid ?in the diagnosis of influenza from Nasopharyngeal swab specimens and ?should not be used as a sole basis for treatment. Nasal washings and ?aspirates are unacceptable for Xpert Xpress SARS-CoV-2/FLU/RSV ?testing. ? ?Fact Sheet for Patients: ?EntrepreneurPulse.com.au ? ?Fact Sheet for Healthcare  Providers: ?IncredibleEmployment.be ? ?This test is not yet approved or cleared by the [place] FDA and ?has been authorized for detection and/or diagnosis of SARS-CoV-2 by ?FDA under an Emergency Use Authorization (EUA). This EUA will remain ?in effect (meaning this test can be used) for the duration of the ?COVID-19 declaration under Section 564(b)(1) of the Act, 21 U.S.C. ?section 360bbb-3(b)(1), unless the authorization is terminated or ?revoked. ? ?Performed at [facility], [address] [place], [place] [address] ?  ?Lactic acid, plasma     Status: Abnormal  ? Collection Time: 08/11/2021 11:55 AM  ?Result Value Ref Range  ? Lactic Acid, Venous 4.1 (HH) 0.5 - 1.9 mmol/L  ?  Comment: CRITICAL RESULT CALLED TO, READ BACK BY AND VERIFIED WITH: ?WHITE M @ 1240 ON [id number] BY HENDERSON L ?Performed at [facility], [address] [place], [place] [address] ?  ?Blood Culture (routine x 2)     Status: None (Preliminary result)  ? Collection Time: 07/24/2021 11:55 AM  ? Specimen: Right Antecubital; Blood  ?  Result Value Ref Range  ? Specimen Description    ?  RIGHT ANTECUBITAL BOTTLES DRAWN AEROBIC AND ANAEROBIC  ? Special Requests    ?  Blood Culture results may not be optimal due to an excessive volume of blood received in culture bottles ?Performed at [facility], [address], [place], [place] [address] ?  ? Culture PENDING   ? Report Status PENDING   ?CBC WITH DIFFERENTIAL     Status: Abnormal  ? Collection Time: 08/07/2021 11:55 AM  ?Result Value Ref Range  ? WBC 26.0 (H) 4.0 - 10.5 K/uL  ? RBC 5.37 4.22 - 5.81 MIL/uL  ? Hemoglobin 15.5 13.0 - 17.0 g/dL  ? HCT 49.0 39.0 - 52.0 %  ? MCV 91.2 80.0 - 100.0 fL  ? MCH 28.9 26.0 - 34.0 pg  ? MCHC 31.6 30.0 - 36.0 g/dL  ? RDW 15.4 11.5 - 15.5 %  ? Platelets 235 150 - 400 K/uL  ? nRBC 0.0 0.0 - 0.2 %  ? Neutrophils Relative % 93 %  ? Neutro Abs 24.2 (H) 1.7 - 7.7 K/uL  ? Lymphocytes Relative 3 %  ? Lymphs Abs 0.7 0.7 - 4.0 K/uL  ? Monocytes Relative 3 %   ? Monocytes Absolute 0.7 0.1 - 1.0 K/uL  ? Eosinophils Relative 0 %  ? Eosinophils Absolute 0.0 0.0 - 0.5 K/uL  ? Basophils Relative 0 %  ? Basophils Absolute 0.1 0.0 - 0.1 K/uL  ? WBC Morphology MORPHOLOGY UNREMARKABLE   ? RBC Morphology

## 2021-07-31 NOTE — Assessment & Plan Note (Signed)
Hold home blood pressure agents due to soft blood pressure readings ?Monitor carefully ?

## 2021-07-31 NOTE — Assessment & Plan Note (Signed)
Continue aspirin and statin. 

## 2021-07-31 NOTE — Progress Notes (Signed)
Added HFNC saulter 10 liters , under Non Rebreather mask 15 liters ?

## 2021-07-31 NOTE — Assessment & Plan Note (Addendum)
Plan to hold Cardizem for now given soft blood pressure readings, heart rate  controlled. ?Continue Eliquis for anticoagulation ?

## 2021-07-31 NOTE — ED Triage Notes (Signed)
Pt brought in by [facility] from home with c/o weakness. Pt's O2 sat was noted to be 60% on RA, therefore pt was placed on NRB by EMS. BP 100/60, HR 130. Pt was diagnosed with Covid 1 week ago.  ?

## 2021-07-31 NOTE — Progress Notes (Signed)
[facility] following for sepsis protocol. 

## 2021-07-31 NOTE — ED Provider Notes (Addendum)
?[place] ?Provider Note ? ? ?CSN: [id number] ?Arrival date & time: 07/15/2021  1144 ? ?  ? ?History ? ?No chief complaint on file. ? ? ?Ross Hulan Johnson is a 81 y.o. male.  He has a history of A-fib on anticoagulation, hypertension.  He said he started feeling low energy last week and was tested positive for COVID.  Has not been eating or drinking very much and feeling weaker.  Nonproductive cough.  Denies any fevers chills chest pain shortness of breath abdominal pain vomiting diarrhea.  EMS found with oxygen saturations of 60%.  Placed on nonrebreather.  Patient is not COVID vaccinated ? ?The history is provided by the patient.  ?Weakness ?Severity:  Severe ?Onset quality:  Gradual ?Duration:  1 week ?Timing:  Constant ?Progression:  Worsening ?Context: recent infection   ?Relieved by:  Nothing ?Worsened by:  Activity ?Ineffective treatments:  Rest ?Associated symptoms: cough and lethargy   ?Associated symptoms: no abdominal pain, no chest pain, no diarrhea, no dysuria, no fever, no headaches, no nausea, no shortness of breath and no vomiting   ?Risk factors: neurologic disease   ? ?  ? ?Home Medications ?Prior to Admission medications   ?Medication Sig Start Date End Date Taking? Authorizing Provider  ?acetaminophen (TYLENOL) 500 MG tablet Take 1,000 mg by mouth every 8 (eight) hours as needed for moderate pain.    [provider]  ?apixaban (ELIQUIS) 2.5 MG TABS tablet TAKE ONE TABLET BY MOUTH TWICE DAILY 07/20/21   Camnitz, Ocie Doyne, MD  ?aspirin 81 MG chewable tablet Chew 1 tablet (81 mg total) by mouth daily. 05/06/18   Thurnell Lose, MD  ?Cholecalciferol (VITAMIN D) 50 MCG (2000 UT) CAPS Take 2,000 Units by mouth daily.     [provider]  ?DILT-XR 180 MG 24 hr capsule Take 180 mg by mouth daily. 09/26/20   [provider]  ?docusate sodium (COLACE) 100 MG capsule Take 100 mg by mouth daily.    [provider]  ?lisinopril-hydrochlorothiazide  (PRINZIDE,ZESTORETIC) 10-12.5 MG tablet Take 0.5 tablets by mouth daily.     [provider]  ?pantoprazole (PROTONIX) 40 MG tablet Take 40 mg by mouth daily. 05/09/19   [provider]  ?rosuvastatin (CRESTOR) 20 MG tablet Take 20 mg by mouth every evening.     [provider]  ?vitamin B-12 (CYANOCOBALAMIN) 1000 MCG tablet Take 1,000 mcg by mouth 2 (two) times a week. Wednesday and Saturday    [provider]  ?   ? ?Allergies    ?Immune globulins, Influenza vaccines, and Rho (d) immune globulin   ? ?Review of Systems   ?Review of Systems  ?Constitutional:  Negative for fever.  ?HENT:  Negative for sore throat.   ?Eyes:  Negative for visual disturbance.  ?Respiratory:  Positive for cough. Negative for shortness of breath.   ?Cardiovascular:  Negative for chest pain.  ?Gastrointestinal:  Negative for abdominal pain, diarrhea, nausea and vomiting.  ?Genitourinary:  Negative for dysuria.  ?Musculoskeletal:  Negative for neck pain.  ?Skin:  Negative for rash.  ?Neurological:  Positive for weakness. Negative for headaches.  ? ?Physical Exam ?Updated Vital Signs ?BP (!) 102/56 (BP Location: Left Arm)   Pulse (!) 121   Temp (!) 97.4 ?F (36.3 ?C) (Oral)   Resp (!) 22   Ht '5\' 9"'$  (1.753 m)   Wt 108.8 kg   SpO2 97%   BMI 35.41 kg/m?  ?Physical Exam ?Vitals and nursing note reviewed.  ?  Constitutional:   ?   General: He is not in acute distress. ?   Appearance: Normal appearance. He is well-developed.  ?HENT:  ?   Head: Normocephalic and atraumatic.  ?Eyes:  ?   Conjunctiva/sclera: Conjunctivae normal.  ?Cardiovascular:  ?   Rate and Rhythm: Regular rhythm. Tachycardia present.  ?   Heart sounds: No murmur heard. ?Pulmonary:  ?   Effort: Pulmonary effort is normal. No respiratory distress.  ?   Breath sounds: Normal breath sounds.  ?Abdominal:  ?   Palpations: Abdomen is soft.  ?   Tenderness: There is no abdominal tenderness. There is no guarding or rebound.  ?Musculoskeletal:     ?    General: No swelling.  ?   Cervical back: Neck supple.  ?   Right lower leg: No edema.  ?   Left lower leg: No edema.  ?Skin: ?   General: Skin is warm and dry.  ?   Capillary Refill: Capillary refill takes less than 2 seconds.  ?Neurological:  ?   Mental Status: He is alert. Mental status is at baseline.  ? ? ?ED Results / Procedures / Treatments   ?Labs ?(all labs ordered are listed, but only abnormal results are displayed) ?Labs Reviewed  ?RESP PANEL BY RT-PCR (FLU A&B, COVID) ARPGX2 - Abnormal; Notable for the following components:  ?    Result Value  ? SARS Coronavirus 2 by RT PCR POSITIVE (*)   ? All other components within normal limits  ?LACTIC ACID, PLASMA - Abnormal; Notable for the following components:  ? Lactic Acid, Venous 4.1 (*)   ? All other components within normal limits  ?LACTIC ACID, PLASMA - Abnormal; Notable for the following components:  ? Lactic Acid, Venous 3.9 (*)   ? All other components within normal limits  ?CBC WITH DIFFERENTIAL/PLATELET - Abnormal; Notable for the following components:  ? WBC 26.0 (*)   ? Neutro Abs 24.2 (*)   ? Abs Immature Granulocytes 0.29 (*)   ? All other components within normal limits  ?COMPREHENSIVE METABOLIC PANEL - Abnormal; Notable for the following components:  ? CO2 20 (*)   ? Glucose, Bld 151 (*)   ? BUN 70 (*)   ? Creatinine, Ser 3.93 (*)   ? Calcium 8.0 (*)   ? Albumin 2.8 (*)   ? AST 56 (*)   ? GFR, Estimated 15 (*)   ? Anion gap 17 (*)   ? All other components within normal limits  ?D-DIMER, QUANTITATIVE - Abnormal; Notable for the following components:  ? D-Dimer, Quant 1.27 (*)   ? All other components within normal limits  ?LACTATE DEHYDROGENASE - Abnormal; Notable for the following components:  ? LDH 465 (*)   ? All other components within normal limits  ?FIBRINOGEN - Abnormal; Notable for the following components:  ? Fibrinogen >800 (*)   ? All other components within normal limits  ?BRAIN NATRIURETIC PEPTIDE - Abnormal; Notable for the  following components:  ? B Natriuretic Peptide 168.0 (*)   ? All other components within normal limits  ?CULTURE, BLOOD (ROUTINE X 2)  ?CULTURE, BLOOD (ROUTINE X 2)  ?URINE CULTURE  ?MRSA NEXT GEN BY PCR, NASAL  ?PROCALCITONIN  ?FERRITIN  ?TRIGLYCERIDES  ?C-REACTIVE PROTEIN  ?URINALYSIS, ROUTINE W REFLEX MICROSCOPIC  ?CBC WITH DIFFERENTIAL/PLATELET  ?COMPREHENSIVE METABOLIC PANEL  ?C-REACTIVE PROTEIN  ?D-DIMER, QUANTITATIVE  ?FERRITIN  ?MAGNESIUM  ?LACTIC ACID, PLASMA  ? ? ?EKG ?EKG Interpretation ? ?Date/Time:  Friday July 31 2021  11:59:49 EDT ?Ventricular Rate:  102 ?PR Interval:    ?QRS Duration: 94 ?QT Interval:  345 ?QTC Calculation: 450 ?R Axis:   -14 ?Text Interpretation: Atrial fibrillation with RVR nonspecific T wave lateral new from prior 7/21 Confirmed by Aletta Edouard [phone]) on 08/06/2021 12:06:50 PM ? ?Radiology ?DG Chest Port 1 View ? ?Result Date: 08/09/2021 ?CLINICAL DATA:  Cough, COVID positive EXAM: PORTABLE CHEST 1 VIEW COMPARISON:  11/22/2019 FINDINGS: Transverse diameter of heart is increased. There is interval increase in interstitial markings in the both mid and both lower lung fields, more so on the right side. There is no focal consolidation. There is blunting of left lateral CP angle. There is no pneumothorax. IMPRESSION: Increased interstitial markings are seen in both lungs, more so on the right side suggesting interstitial pneumonia or interstitial edema. There is no focal pulmonary consolidation. Possible small left pleural effusion. Electronically Signed   By: Elmer Picker M.D.   On: 07/21/2021 12:12   ? ?Procedures ?Marland KitchenCritical Care ?Performed by: Hayden Rasmussen, MD ?Authorized by: Hayden Rasmussen, MD  ? ?Critical care provider statement:  ?  Critical care time (minutes):  45 ?  Critical care time was exclusive of:  Separately billable procedures and treating other patients ?  Critical care was necessary to treat or prevent imminent or life-threatening deterioration of the  following conditions:  Respiratory failure and sepsis ?  Critical care was time spent personally by me on the following activities:  Development of treatment plan with patient or surrogate, discussions with consult

## 2021-07-31 NOTE — H&P (Signed)
?History and Physical  ? ? ?Patient: [name] IOM:[id number] DOB: [birth year] ?DOA: 07/17/2021 ?DOS: the patient was seen and examined on 08/05/2021 ?PCP: Lavone Orn, MD  ?Patient coming from: Home ? ?Chief Complaint: No chief complaint on file. ? ?HPI: [name] is a 81 y.o. male with medical history significant of atrial fibrillation on Eliquis, benign brain tumors, carotid artery disease, prior CVA, dyslipidemia, and GERD who presented to the ED with worsening weakness and poor p.o. intake.  He had tested positive for COVID 1 week prior and did not receive vaccination on account of prior history of [name] with prior vaccinations.  He denies any fevers, chills, chest pain, shortness of breath, or abdominal pain, but does have a nonproductive cough as well as some diarrhea.  EMS had noted oxygen saturations of approximately 60% on arrival.  He states that his wife is currently sick as well. ? ?In the ED, he was noted to have significant leukocytosis as well as lactic acid 4.1 and AKI with creatinine 3.93.  He was started on IV fluid as well as Rocephin and azithromycin.  He has been given a dose of steroids as well and is currently on nonrebreather mask.  Blood pressure readings are soft. ? ?Review of Systems: As mentioned in the history of present illness. All other systems reviewed and are negative. ?Past Medical History:  ?Diagnosis Date  ? Anemia 10/30/19,11/06/19  ? iron infusions at [facility]  ? Atrial fibrillation ([place]) 02/2016  ? NEW ONSET  ? Brain tumor (benign) ([place]) 1998  ? 2 small tumors- since 2015 (Dr. [name] following)  ? Carotid artery occlusion   ? Cerebrovascular disease September 09, 2001  ? TIA, Left brain  ? CKD (chronic kidney disease), stage III ([place])   ? Dementia ([place])   ? Diverticulosis   ? Dysrhythmia   ? Afib  ? ED (erectile dysfunction)   ? GERD (gastroesophageal reflux disease)   ? Guillain-Barre syndrome ([place]) [id number]  ? Hx of elevated lipids   ? Hyperlipidemia   ?  Hypertension   ? Memory loss   ? Stroke [facility]) [phone]  ? ?Past Surgical History:  ?Procedure Laterality Date  ? BRAIN SURGERY  1998  ? Benign brain tumor removed, Left hemisphere- ? Meningioma  ? CAROTID ENDARTERECTOMY Left September 13, 2001  ? LEFT cea  ? CHOLECYSTECTOMY  Nov. 2001  ? COLON SURGERY  Feb. 2003  ? Colonic polyps removed endoscopically  ? COLONOSCOPY WITH PROPOFOL N/A 11/11/2015  ? Procedure: COLONOSCOPY WITH PROPOFOL;  Surgeon: Garlan Fair, MD;  Location: [facility] ENDOSCOPY;  Service: Endoscopy;  Laterality: N/A;  ? ENDARTERECTOMY Left 09/10/2020  ? Procedure: REDO LEFT CAROTID ARTERY ENDARTERECTOMY;  Surgeon: Rosetta Posner, MD;  Location: [facility] OR;  Service: Vascular;  Laterality: Left;  ? EYE SURGERY    ? cataracts  ? INGUINAL HERNIA REPAIR  1970's  ? TONSILLECTOMY    ? TOTAL KNEE ARTHROPLASTY Right 12/03/2019  ? Procedure: RIGHT TOTAL KNEE ARTHROPLASTY;  Surgeon: Frederik Pear, MD;  Location: [facility] ORS;  Service: Orthopedics;  Laterality: Right;  ? ?Social History:  reports that he quit smoking about 25 years ago. His smoking use included cigarettes. He has never used smokeless tobacco. He reports that he does not drink alcohol and does not use drugs. ? ?Allergies  ?Allergen Reactions  ? Immune Globulins Other (See Comments)  ?  Tetanus Immunes Globulins    (  Pt. Cannot take the Flu shot ) ?[name]   ?  Influenza Vaccines Other (See Comments)  ?  [name] ?[name]  ? Rho (D) Immune Globulin Other (See Comments)  ?  Tetanus Immunes Globulins    (  Pt. Cannot take the Flu shot ) ?[name]   ? ? ?Family History  ?Problem Relation Age of Onset  ? Colon cancer Mother   ?     Metastatic   ? Stroke Father   ? Diabetes Father   ? CVA Father   ? ? ?Prior to Admission medications   ?Medication Sig Start Date End Date Taking? Authorizing Provider  ?acetaminophen (TYLENOL) 500 MG tablet Take 1,000 mg by mouth every 8 (eight) hours as needed for moderate pain.   Yes [provider]   ?apixaban (ELIQUIS) 2.5 MG TABS tablet TAKE ONE TABLET BY MOUTH TWICE DAILY ?Patient taking differently: Take 2.5 mg by mouth 2 (two) times daily. 07/20/21  Yes Camnitz, Ocie Doyne, MD  ?aspirin 81 MG chewable tablet Chew 1 tablet (81 mg total) by mouth daily. 05/06/18  Yes Thurnell Lose, MD  ?Cholecalciferol (VITAMIN D) 50 MCG (2000 UT) CAPS Take 2,000 Units by mouth daily.    Yes [provider]  ?DILT-XR 180 MG 24 hr capsule Take 180 mg by mouth daily. 09/26/20  Yes [provider]  ?docusate sodium (COLACE) 100 MG capsule Take 100 mg by mouth daily.   Yes [provider]  ?lisinopril-hydrochlorothiazide (PRINZIDE,ZESTORETIC) 10-12.5 MG tablet Take 0.5 tablets by mouth daily.    Yes [provider]  ?pantoprazole (PROTONIX) 40 MG tablet Take 40 mg by mouth daily. 05/09/19  Yes [provider]  ?rosuvastatin (CRESTOR) 20 MG tablet Take 20 mg by mouth every evening.    Yes [provider]  ?vitamin B-12 (CYANOCOBALAMIN) 1000 MCG tablet Take 1,000 mcg by mouth 2 (two) times a week. Wednesday and Saturday   Yes [provider]  ? ? ?Physical Exam: ?Vitals:  ? 07/18/2021 1230 07/21/2021 1245 07/17/2021 1300 08/07/2021 1330  ?BP: (!) 103/52  (!) 92/53 (!) 111/57  ?Pulse: 100 97 (!) 102 90  ?Resp:  (!) 25 (!) 39 (!) 31  ?Temp:      ?TempSrc:      ?SpO2: 96% 99% 95% 95%  ?Weight:      ?Height:      ? ?General exam: Appears calm and comfortable  ?Respiratory system: Clear to auscultation. Respiratory effort normal. On NRB ?Cardiovascular system: S1 & S2 heard, irregular. ?Gastrointestinal system: Abdomen is soft ?Central nervous system: Alert and awake ?Extremities: Scant bilateral edema ?Skin: No significant lesions noted ?Psychiatry: Flat affect. ? ?Data Reviewed: ? ?There are no new results to review at this time. ? ?Assessment and Plan: ?* Acute hypoxemic respiratory failure due to COVID-19 [facility]) ?Infiltrates noted on chest x-ray ?Continue Solu-Medrol twice  daily ?Continue Rocephin and azithromycin and await procalcitonin ?Start remdesivir ?Not previously vaccinated ? ?AKI (acute kidney injury) ([place]) ?Appears to be prerenal with dehydration and poor p.o. intake ?On baseline CKD stage IIIb with creatinine 2-2.2 ?Plan to hold HCTZ/lisinopril for now ?Monitor carefully as he appears to have CKD ?Continue IV fluid and monitor urine output ?Repeat a.m. labs ? ?CVA (cerebral vascular accident) ([place]) ?Continue aspirin and statin ? ?Benign essential HTN ?Hold home blood pressure agents due to soft blood pressure readings ?Monitor carefully ? ?Atrial fibrillation ([place]) ?Plan to hold Cardizem for now given soft blood pressure readings ?Monitor carefully on telemetry ?Continue Eliquis for anticoagulation ? ? ? Advance Care Planning: Full code ? ?  Consults: None ? ?Family Communication: Discussed with wife on phone 3/17 ? ?Severity of Illness: ?The appropriate patient status for this patient is INPATIENT. Inpatient status is judged to be reasonable and necessary in order to provide the required intensity of service to ensure the patient's safety. The patient's presenting symptoms, physical exam findings, and initial radiographic and laboratory data in the context of their chronic comorbidities is felt to place them at high risk for further clinical deterioration. Furthermore, it is not anticipated that the patient will be medically stable for discharge from the hospital within 2 midnights of admission.  ? ?* I certify that at the point of admission it is my clinical judgment that the patient will require inpatient hospital care spanning beyond 2 midnights from the point of admission due to high intensity of service, high risk for further deterioration and high frequency of surveillance required.* ? ?Author: ?Rodena Goldmann, DO ?07/25/2021 1:55 PM ? ?For on call review [email or web address].  ?

## 2021-08-01 DIAGNOSIS — N184 Chronic kidney disease, stage 4 (severe): Secondary | ICD-10-CM

## 2021-08-01 DIAGNOSIS — U071 COVID-19: Secondary | ICD-10-CM | POA: Diagnosis not present

## 2021-08-01 DIAGNOSIS — J159 Unspecified bacterial pneumonia: Secondary | ICD-10-CM

## 2021-08-01 DIAGNOSIS — N170 Acute kidney failure with tubular necrosis: Secondary | ICD-10-CM | POA: Diagnosis not present

## 2021-08-01 DIAGNOSIS — A419 Sepsis, unspecified organism: Secondary | ICD-10-CM

## 2021-08-01 DIAGNOSIS — J9601 Acute respiratory failure with hypoxia: Secondary | ICD-10-CM | POA: Diagnosis not present

## 2021-08-01 DIAGNOSIS — R6520 Severe sepsis without septic shock: Secondary | ICD-10-CM

## 2021-08-01 LAB — CBC WITH DIFFERENTIAL/PLATELET
Abs Immature Granulocytes: 0.22 10*3/uL — ABNORMAL HIGH (ref 0.00–0.07)
Basophils Absolute: 0.1 10*3/uL (ref 0.0–0.1)
Basophils Relative: 0 %
Eosinophils Absolute: 0 10*3/uL (ref 0.0–0.5)
Eosinophils Relative: 0 %
HCT: 42.4 % (ref 39.0–52.0)
Hemoglobin: 12.9 g/dL — ABNORMAL LOW (ref 13.0–17.0)
Immature Granulocytes: 1 %
Lymphocytes Relative: 1 %
Lymphs Abs: 0.4 10*3/uL — ABNORMAL LOW (ref 0.7–4.0)
MCH: 27.7 pg (ref 26.0–34.0)
MCHC: 30.4 g/dL (ref 30.0–36.0)
MCV: 91.2 fL (ref 80.0–100.0)
Monocytes Absolute: 0.4 10*3/uL (ref 0.1–1.0)
Monocytes Relative: 2 %
Neutro Abs: 23.3 10*3/uL — ABNORMAL HIGH (ref 1.7–7.7)
Neutrophils Relative %: 96 %
Platelets: 179 10*3/uL (ref 150–400)
RBC: 4.65 MIL/uL (ref 4.22–5.81)
RDW: 15.6 % — ABNORMAL HIGH (ref 11.5–15.5)
WBC: 24.3 10*3/uL — ABNORMAL HIGH (ref 4.0–10.5)
nRBC: 0 % (ref 0.0–0.2)

## 2021-08-01 LAB — COMPREHENSIVE METABOLIC PANEL
ALT: 34 U/L (ref 0–44)
AST: 66 U/L — ABNORMAL HIGH (ref 15–41)
Albumin: 2.1 g/dL — ABNORMAL LOW (ref 3.5–5.0)
Alkaline Phosphatase: 90 U/L (ref 38–126)
Anion gap: 12 (ref 5–15)
BUN: 69 mg/dL — ABNORMAL HIGH (ref 8–23)
CO2: 19 mmol/L — ABNORMAL LOW (ref 22–32)
Calcium: 7.5 mg/dL — ABNORMAL LOW (ref 8.9–10.3)
Chloride: 109 mmol/L (ref 98–111)
Creatinine, Ser: 3.34 mg/dL — ABNORMAL HIGH (ref 0.61–1.24)
GFR, Estimated: 18 mL/min — ABNORMAL LOW (ref >60)
Glucose, Bld: 200 mg/dL — ABNORMAL HIGH (ref 70–99)
Potassium: 3.9 mmol/L (ref 3.5–5.1)
Sodium: 140 mmol/L (ref 135–145)
Total Bilirubin: 0.4 mg/dL (ref 0.3–1.2)
Total Protein: 5.5 g/dL — ABNORMAL LOW (ref 6.5–8.1)

## 2021-08-01 LAB — C-REACTIVE PROTEIN: CRP: 39.5 mg/dL — ABNORMAL HIGH (ref <1.0)

## 2021-08-01 LAB — BLOOD GAS, ARTERIAL
Acid-base deficit: 6.6 mmol/L — ABNORMAL HIGH (ref 0.0–2.0)
Bicarbonate: 18.5 mmol/L — ABNORMAL LOW (ref 20.0–28.0)
FIO2: 80 %
O2 Saturation: 95.8 %
Patient temperature: 37
pCO2 arterial: 35 mmHg (ref 32–48)
pH, Arterial: 7.33 — ABNORMAL LOW (ref 7.35–7.45)
pO2, Arterial: 76 mmHg — ABNORMAL LOW (ref 83–108)

## 2021-08-01 LAB — FERRITIN: Ferritin: 187 ng/mL (ref 24–336)

## 2021-08-01 LAB — MAGNESIUM: Magnesium: 2.4 mg/dL (ref 1.7–2.4)

## 2021-08-01 LAB — D-DIMER, QUANTITATIVE: D-Dimer, Quant: 3.4 ug/mL-FEU — ABNORMAL HIGH (ref 0.00–0.50)

## 2021-08-01 LAB — LACTIC ACID, PLASMA: Lactic Acid, Venous: 4.3 mmol/L (ref 0.5–1.9)

## 2021-08-01 MED ORDER — CHLORHEXIDINE GLUCONATE 0.12 % MT SOLN
15.0000 mL | Freq: Two times a day (BID) | OROMUCOSAL | Status: DC
  Administered 2021-08-02: 15 mL via OROMUCOSAL
  Filled 2021-08-01: qty 15

## 2021-08-01 MED ORDER — LACTATED RINGERS IV SOLN: INTRAVENOUS | Status: DC

## 2021-08-01 MED ORDER — ALBUTEROL SULFATE HFA 108 (90 BASE) MCG/ACT IN AERS
2.0000 | INHALATION_SPRAY | Freq: Four times a day (QID) | RESPIRATORY_TRACT | Status: DC | PRN
  Filled 2021-08-01: qty 6.7

## 2021-08-01 MED ORDER — MORPHINE SULFATE (PF) 4 MG/ML IV SOLN
4.0000 mg | Freq: Once | INTRAVENOUS | Status: AC
  Administered 2021-08-01: 4 mg via INTRAVENOUS
  Filled 2021-08-01: qty 1

## 2021-08-01 MED ORDER — TOCILIZUMAB 400 MG/20ML IV SOLN
800.0000 mg | Freq: Once | INTRAVENOUS | Status: AC
  Administered 2021-08-01: 800 mg via INTRAVENOUS
  Filled 2021-08-01 (×3): qty 40

## 2021-08-01 MED ORDER — TOCILIZUMAB 400 MG/20ML IV SOLN: 4.0000 mg/kg | Freq: Once | INTRAVENOUS | Status: DC

## 2021-08-01 MED ORDER — DILTIAZEM HCL 25 MG/5ML IV SOLN
15.0000 mg | Freq: Once | INTRAVENOUS | Status: AC
Start: 2021-08-02 — End: 2021-08-01
  Administered 2021-08-01: 15 mg via INTRAVENOUS
  Filled 2021-08-01: qty 5

## 2021-08-01 MED ORDER — LEVALBUTEROL HCL 0.63 MG/3ML IN NEBU
INHALATION_SOLUTION | RESPIRATORY_TRACT | Status: AC
  Administered 2021-08-01: 0.63 mg
  Filled 2021-08-01: qty 3

## 2021-08-01 MED ORDER — IPRATROPIUM BROMIDE 0.02 % IN SOLN
RESPIRATORY_TRACT | Status: AC
  Administered 2021-08-01: 0.5 mg
  Filled 2021-08-01: qty 2.5

## 2021-08-01 MED ORDER — ALBUTEROL SULFATE (2.5 MG/3ML) 0.083% IN NEBU
INHALATION_SOLUTION | RESPIRATORY_TRACT | Status: AC
Start: 2021-08-01 — End: 2021-08-01
  Administered 2021-08-01: 2.5 mg
  Filled 2021-08-01: qty 3

## 2021-08-01 MED ORDER — IPRATROPIUM-ALBUTEROL 0.5-2.5 (3) MG/3ML IN SOLN
3.0000 mL | RESPIRATORY_TRACT | Status: DC
  Administered 2021-08-01 (×6): 3 mL via RESPIRATORY_TRACT
  Filled 2021-08-01 (×6): qty 3

## 2021-08-01 NOTE — Progress Notes (Signed)
MD made aware of patient's change in condition overnight, d-dimer increased, O2 SAT dropping to 45% & requiring BiPAP @ 80% FiO2 ?

## 2021-08-01 NOTE — Progress Notes (Signed)
Removed patient from Bipap and placed him on hfnc @ 15lpm SPO2 ranging from 88%-91%. Pt states he feels ok at this time. RT will continue to communicate with RN on FIO2 requirements. NRB beside his head and on if needed  ?

## 2021-08-01 NOTE — Hospital Course (Addendum)
[name] is a 81 y.o. male with medical history significant of atrial fibrillation on Eliquis, benign brain tumors, carotid artery disease, prior CVA, dyslipidemia, and GERD who presented to the ED with worsening weakness and poor p.o. intake.  He had tested positive for COVID 1 week prior and did not receive vaccination on account of prior history of [name] with prior vaccinations.  ?Upon arriving the hospital, patient oxygen saturation was 60%.  He was initially placed on 100% nonrebreather, changed to BiPAP. ?Patient had a lactic acidosis of 4.1, creatinine of 3.93.  He was treated with IV fluid bolus.  Also started IV steroids, remdesivir.  Rocephin and Zithromax were all started for secondary bacterial pneumonia. ? ?26-Aug-2021: Patient has had worsening condition since admission with increased bilateral infiltrates noted on 3/18 and need for BiPAP.  He was also noted to have increase in inflammatory markers and was given a dose of Actemra on 3/18.  He has also developed severe sepsis and remains on antibiotics with Rocephin and azithromycin presumed to be related to bacterial pneumonia.  Overnight, he has had worsening respiratory failure and is now requiring intubation.  He is also noted to have atrial fibrillation with RVR requiring Cardizem drip.  Plan to discuss case with PCCM and transfer to [facility] ICU. ?

## 2021-08-01 NOTE — Assessment & Plan Note (Signed)
In addition to COVID-pneumonia, patient also has a bacterial pneumonia with elevated procalcitonin level at 1.74.  Continue antibiotics with Rocephin and Zithromax. ?

## 2021-08-01 NOTE — Progress Notes (Signed)
Patient saturation is higher on ear probe  When probe place on hand saturation is lower. Blood gas obtained to see which reading is correct. Hand seems to be closer to blood gas value. Patient has rhonchi almost coarse crackles this is unusual for COVID.  ?

## 2021-08-01 NOTE — Progress Notes (Signed)
?Progress Note ? ? ?Patient: [name] QJF:[id number] DOB: [birth year] DOA: 08/05/2021     1 ?DOS: the patient was seen and examined on 08/01/2021 ?  ?Brief hospital course: ?[name] is a 81 y.o. male with medical history significant of atrial fibrillation on Eliquis, benign brain tumors, carotid artery disease, prior CVA, dyslipidemia, and GERD who presented to the ED with worsening weakness and poor p.o. intake.  He had tested positive for COVID 1 week prior and did not receive vaccination on account of prior history of [name]? with prior vaccinations.  ?Upon arriving the hospital, patient oxygen saturation was 60%.  He was initially placed on 100% nonrebreather, changed to BiPAP. ?Patient had a lactic acidosis of 4.1, creatinine of 3.93.  He was treated with IV fluid bolus.  Also started IV steroids, remdesivir.  Rocephin and Zithromax were all started for secondary bacterial pneumonia. ? ?Assessment and Plan: ?* Acute hypoxemic respiratory failure due to COVID-19 [facility]) ?Reviewed the patient chest x-ray image, significant bilateral pulmonary infiltrates, taking over at least 75% of the volume. ?Since admission to the hospital, patient has been requiring BiPAP with 80% oxygen.  He is treated with high-dose steroids, remdesivir.  Patient also has profound elevation of CRP at 42.2, he has inflammation storm. ?Due to severe respiratory failure, patient has a high risk for intubation.  I will give a dose of Actemra in addition to high-dose steroids. ?Patient has very mild BNP elevation with acute renal failure.  Not consistent with volume overload. ?Patient also has secondary bacterial pneumonia.  Will be treated with antibiotics. ?Patient will be followed closely in the ICU. ? ?Severe sepsis ([place]) ?Patient met severe sepsis proteinuria at time of admission with leukocytosis, tachypnea, tachycardia.  He also had a lactic acid of 4.1 with acute respiratory failure and acute renal failure. ?This is  secondary to COVID infection and a secondary bacterial pneumonia.   ?He has received IV fluid in the emergency room, I will continue some IV fluids today. ? ?Secondary bacterial pneumonia ?In addition to COVID-pneumonia, patient also has a bacterial pneumonia with elevated procalcitonin level at 1.74.  Continue antibiotics with Rocephin and Zithromax. ? ?Acute renal failure superimposed on stage 4 chronic kidney disease ([place]) ?Patient has much worsening renal function due to sepsis.  Continue IV fluids for now. ? ?CVA (cerebral vascular accident) ([place]) ?Continue aspirin and statin ? ?Benign essential HTN ?Hold home blood pressure agents due to soft blood pressure readings ?Monitor carefully ? ?Permanent atrial fibrillation ([place]) ?Plan to hold Cardizem for now given soft blood pressure readings, heart rate  controlled. ?Continue Eliquis for anticoagulation ? ? ? ? ?  ? ?Subjective:  ?Patient still on BiPAP with 80% oxygen.  He has a cough, but does not feel short of breath. ?Denies any abdominal pain nausea vomiting. ? ? ?Physical Exam: ?Vitals:  ? 08/01/21 0628 08/01/21 0814 08/01/21 0900 08/01/21 1022  ?BP:   (!) 114/48   ?Pulse:  97 (!) 106   ?Resp:  (!) 31 (!) 28   ?Temp: (!) 97.3 ?F (36.3 ?C)   (!) 97.3 ?F (36.3 ?C)  ?TempSrc: Axillary   Axillary  ?SpO2:  91% 92%   ?Weight:      ?Height:      ? ?General exam: Appears calm and comfortable  ?Respiratory system: Clear to auscultation. Respiratory effort normal. ?Cardiovascular system: Irregularly irregular. No JVD, murmurs, rubs, gallops or clicks. No pedal edema. ?Gastrointestinal system: Abdomen is nondistended, soft and nontender. No organomegaly  or masses felt. Normal bowel sounds heard. ?Central nervous system: Alert and oriented. No focal neurological deficits. ?Extremities: Symmetric 5 x 5 power. ?Skin: No rashes, lesions or ulcers ?Psychiatry: Judgement and insight appear normal. Mood & affect appropriate.  ? ?Data Reviewed: ?Reviewed all lab results,  reviewed chest x-ray, independently reviewed chest x-ray image. ? ?Family Communication: Wife updated over the phone. ? ?Disposition: ?Status is: Inpatient ?Remains inpatient appropriate because: Severity of disease, high risk for deterioration ? Planned Discharge Destination: Home with Home Health ? ? ? ?Time spent: 45 minutes ? ?Author: ?Sharen Hones, MD ?08/01/2021 10:53 AM ? ?For on call review [email or web address]  ?

## 2021-08-01 NOTE — Assessment & Plan Note (Signed)
Patient met severe sepsis proteinuria at time of admission with leukocytosis, tachypnea, tachycardia.  He also had a lactic acid of 4.1 with acute respiratory failure and acute renal failure. ?This is secondary to COVID infection and a secondary bacterial pneumonia.   ?He has received IV fluid in the emergency room, I will continue some IV fluids today. ?

## 2021-08-02 ENCOUNTER — Inpatient Hospital Stay (HOSPITAL_COMMUNITY): Payer: PPO

## 2021-08-02 DIAGNOSIS — U071 COVID-19: Secondary | ICD-10-CM

## 2021-08-02 DIAGNOSIS — I998 Other disorder of circulatory system: Secondary | ICD-10-CM

## 2021-08-02 DIAGNOSIS — M7989 Other specified soft tissue disorders: Secondary | ICD-10-CM

## 2021-08-02 DIAGNOSIS — R7989 Other specified abnormal findings of blood chemistry: Secondary | ICD-10-CM

## 2021-08-02 DIAGNOSIS — J80 Acute respiratory distress syndrome: Secondary | ICD-10-CM

## 2021-08-02 DIAGNOSIS — R609 Edema, unspecified: Secondary | ICD-10-CM

## 2021-08-02 DIAGNOSIS — J1289 Other viral pneumonia: Secondary | ICD-10-CM

## 2021-08-02 DIAGNOSIS — I743 Embolism and thrombosis of arteries of the lower extremities: Secondary | ICD-10-CM

## 2021-08-02 DIAGNOSIS — J9601 Acute respiratory failure with hypoxia: Secondary | ICD-10-CM | POA: Diagnosis not present

## 2021-08-02 LAB — CBC
HCT: 38.3 % — ABNORMAL LOW (ref 39.0–52.0)
Hemoglobin: 12.3 g/dL — ABNORMAL LOW (ref 13.0–17.0)
MCH: 29 pg (ref 26.0–34.0)
MCHC: 32.1 g/dL (ref 30.0–36.0)
MCV: 90.3 fL (ref 80.0–100.0)
Platelets: UNDETERMINED 10*3/uL (ref 150–400)
RBC: 4.24 MIL/uL (ref 4.22–5.81)
RDW: 16.2 % — ABNORMAL HIGH (ref 11.5–15.5)
WBC: 29.7 10*3/uL — ABNORMAL HIGH (ref 4.0–10.5)
nRBC: 0.2 % (ref 0.0–0.2)

## 2021-08-02 LAB — BLOOD GAS, ARTERIAL
Acid-base deficit: 5.6 mmol/L — ABNORMAL HIGH (ref 0.0–2.0)
Acid-base deficit: 7.2 mmol/L — ABNORMAL HIGH (ref 0.0–2.0)
Bicarbonate: 19.3 mmol/L — ABNORMAL LOW (ref 20.0–28.0)
Bicarbonate: 19.7 mmol/L — ABNORMAL LOW (ref 20.0–28.0)
Drawn by: 27016
FIO2: 95 %
O2 Saturation: 86.4 %
O2 Saturation: 98.5 %
Patient temperature: 37
Patient temperature: 36.6
pCO2 arterial: 35 mmHg (ref 32–48)
pCO2 arterial: 43 mmHg (ref 32–48)
pH, Arterial: 7.35 (ref 7.35–7.45)
pH, Arterial: 7.27 — ABNORMAL LOW (ref 7.35–7.45)
pO2, Arterial: 53 mmHg — ABNORMAL LOW (ref 83–108)
pO2, Arterial: 133 mmHg — ABNORMAL HIGH (ref 83–108)

## 2021-08-02 LAB — COMPREHENSIVE METABOLIC PANEL
ALT: 39 U/L (ref 0–44)
ALT: 40 U/L (ref 0–44)
AST: 93 U/L — ABNORMAL HIGH (ref 15–41)
AST: 113 U/L — ABNORMAL HIGH (ref 15–41)
Albumin: 2.2 g/dL — ABNORMAL LOW (ref 3.5–5.0)
Albumin: 1.7 g/dL — ABNORMAL LOW (ref 3.5–5.0)
Alkaline Phosphatase: 145 U/L — ABNORMAL HIGH (ref 38–126)
Alkaline Phosphatase: 114 U/L (ref 38–126)
Anion gap: 17 — ABNORMAL HIGH (ref 5–15)
Anion gap: 11 (ref 5–15)
BUN: 75 mg/dL — ABNORMAL HIGH (ref 8–23)
BUN: 78 mg/dL — ABNORMAL HIGH (ref 8–23)
CO2: 18 mmol/L — ABNORMAL LOW (ref 22–32)
CO2: 19 mmol/L — ABNORMAL LOW (ref 22–32)
Calcium: 8.2 mg/dL — ABNORMAL LOW (ref 8.9–10.3)
Calcium: 7.6 mg/dL — ABNORMAL LOW (ref 8.9–10.3)
Chloride: 110 mmol/L (ref 98–111)
Chloride: 111 mmol/L (ref 98–111)
Creatinine, Ser: 2.99 mg/dL — ABNORMAL HIGH (ref 0.61–1.24)
Creatinine, Ser: 3.38 mg/dL — ABNORMAL HIGH (ref 0.61–1.24)
GFR, Estimated: 20 mL/min — ABNORMAL LOW (ref >60)
GFR, Estimated: 18 mL/min — ABNORMAL LOW (ref >60)
Glucose, Bld: 168 mg/dL — ABNORMAL HIGH (ref 70–99)
Glucose, Bld: 234 mg/dL — ABNORMAL HIGH (ref 70–99)
Potassium: 4.1 mmol/L (ref 3.5–5.1)
Potassium: 5 mmol/L (ref 3.5–5.1)
Sodium: 145 mmol/L (ref 135–145)
Sodium: 141 mmol/L (ref 135–145)
Total Bilirubin: 0.3 mg/dL (ref 0.3–1.2)
Total Bilirubin: 0.7 mg/dL (ref 0.3–1.2)
Total Protein: 5.9 g/dL — ABNORMAL LOW (ref 6.5–8.1)
Total Protein: 4.6 g/dL — ABNORMAL LOW (ref 6.5–8.1)

## 2021-08-02 LAB — HEPARIN LEVEL (UNFRACTIONATED): Heparin Unfractionated: >1.1 IU/mL — ABNORMAL HIGH (ref 0.30–0.70)

## 2021-08-02 LAB — POCT I-STAT 7, (LYTES, BLD GAS, ICA,H+H)
Acid-base deficit: 6 mmol/L — ABNORMAL HIGH (ref 0.0–2.0)
Bicarbonate: 21.4 mmol/L (ref 20.0–28.0)
Calcium, Ion: 1.15 mmol/L (ref 1.15–1.40)
HCT: 50 % (ref 39.0–52.0)
Hemoglobin: 17 g/dL (ref 13.0–17.0)
O2 Saturation: 98 %
Patient temperature: 96.4
Potassium: 5.2 mmol/L — ABNORMAL HIGH (ref 3.5–5.1)
Sodium: 141 mmol/L (ref 135–145)
TCO2: 23 mmol/L (ref 22–32)
pCO2 arterial: 47.2 mmHg (ref 32–48)
pH, Arterial: 7.258 — ABNORMAL LOW (ref 7.35–7.45)
pO2, Arterial: 112 mmHg — ABNORMAL HIGH (ref 83–108)

## 2021-08-02 LAB — CBC WITH DIFFERENTIAL/PLATELET
Abs Immature Granulocytes: 0.48 10*3/uL — ABNORMAL HIGH (ref 0.00–0.07)
Basophils Absolute: 0.1 10*3/uL (ref 0.0–0.1)
Basophils Relative: 0 %
Eosinophils Absolute: 0 10*3/uL (ref 0.0–0.5)
Eosinophils Relative: 0 %
HCT: 44.4 % (ref 39.0–52.0)
Hemoglobin: 14.1 g/dL (ref 13.0–17.0)
Immature Granulocytes: 1 %
Lymphocytes Relative: 1 %
Lymphs Abs: 0.4 10*3/uL — ABNORMAL LOW (ref 0.7–4.0)
MCH: 29.2 pg (ref 26.0–34.0)
MCHC: 31.8 g/dL (ref 30.0–36.0)
MCV: 91.9 fL (ref 80.0–100.0)
Monocytes Absolute: 0.5 10*3/uL (ref 0.1–1.0)
Monocytes Relative: 2 %
Neutro Abs: 33.5 10*3/uL — ABNORMAL HIGH (ref 1.7–7.7)
Neutrophils Relative %: 96 %
Platelets: 144 10*3/uL — ABNORMAL LOW (ref 150–400)
RBC: 4.83 MIL/uL (ref 4.22–5.81)
RDW: 15.8 % — ABNORMAL HIGH (ref 11.5–15.5)
WBC: 34.9 10*3/uL — ABNORMAL HIGH (ref 4.0–10.5)
nRBC: 0.1 % (ref 0.0–0.2)

## 2021-08-02 LAB — LACTIC ACID, PLASMA
Lactic Acid, Venous: 4.2 mmol/L (ref 0.5–1.9)
Lactic Acid, Venous: 3 mmol/L (ref 0.5–1.9)

## 2021-08-02 LAB — GLUCOSE, CAPILLARY
Glucose-Capillary: 213 mg/dL — ABNORMAL HIGH (ref 70–99)
Glucose-Capillary: 220 mg/dL — ABNORMAL HIGH (ref 70–99)
Glucose-Capillary: 195 mg/dL — ABNORMAL HIGH (ref 70–99)
Glucose-Capillary: 205 mg/dL — ABNORMAL HIGH (ref 70–99)

## 2021-08-02 LAB — APTT: aPTT: 171 seconds (ref 24–36)

## 2021-08-02 LAB — TROPONIN I (HIGH SENSITIVITY)
Troponin I (High Sensitivity): 440 ng/L (ref <18)
Troponin I (High Sensitivity): 603 ng/L (ref <18)

## 2021-08-02 LAB — FERRITIN: Ferritin: 275 ng/mL (ref 24–336)

## 2021-08-02 LAB — D-DIMER, QUANTITATIVE: D-Dimer, Quant: >20 ug/mL-FEU — ABNORMAL HIGH (ref 0.00–0.50)

## 2021-08-02 LAB — C-REACTIVE PROTEIN: CRP: 31.1 mg/dL — ABNORMAL HIGH (ref <1.0)

## 2021-08-02 LAB — MAGNESIUM: Magnesium: 2.8 mg/dL — ABNORMAL HIGH (ref 1.7–2.4)

## 2021-08-02 MED ORDER — ZINC SULFATE 220 (50 ZN) MG PO CAPS: 220.0000 mg | ORAL_CAPSULE | Freq: Every day | ORAL | Status: DC

## 2021-08-02 MED ORDER — MIDAZOLAM HCL 2 MG/2ML IJ SOLN: 1.0000 mg | INTRAMUSCULAR | Status: DC | PRN

## 2021-08-02 MED ORDER — HALOPERIDOL LACTATE 5 MG/ML IJ SOLN: 0.5000 mg | INTRAMUSCULAR | Status: DC | PRN

## 2021-08-02 MED ORDER — MORPHINE SULFATE (PF) 4 MG/ML IV SOLN
4.0000 mg | Freq: Once | INTRAVENOUS | Status: AC
  Administered 2021-08-02: 4 mg via INTRAVENOUS
  Filled 2021-08-02: qty 1

## 2021-08-02 MED ORDER — LABETALOL HCL 5 MG/ML IV SOLN
10.0000 mg | INTRAVENOUS | Status: DC | PRN
  Administered 2021-08-02: 10 mg via INTRAVENOUS
  Filled 2021-08-02: qty 4

## 2021-08-02 MED ORDER — POLYETHYLENE GLYCOL 3350 17 G PO PACK: 17.0000 g | PACK | Freq: Every day | ORAL | Status: DC

## 2021-08-02 MED ORDER — ETOMIDATE 2 MG/ML IV SOLN
INTRAVENOUS | Status: AC
  Administered 2021-08-02: 30 mg
  Filled 2021-08-02: qty 20

## 2021-08-02 MED ORDER — GLYCOPYRROLATE 0.2 MG/ML IJ SOLN
0.2000 mg | INTRAMUSCULAR | Status: DC | PRN
  Administered 2021-08-02: 0.2 mg via INTRAVENOUS
  Filled 2021-08-02: qty 1

## 2021-08-02 MED ORDER — ONDANSETRON 4 MG PO TBDP: 4.0000 mg | ORAL_TABLET | Freq: Four times a day (QID) | ORAL | Status: DC | PRN

## 2021-08-02 MED ORDER — SODIUM CHLORIDE 0.9 % IV SOLN
INTRAVENOUS | Status: DC | PRN
Start: 2021-08-02 — End: 2021-08-03

## 2021-08-02 MED ORDER — ASCORBIC ACID 500 MG PO TABS: 500.0000 mg | ORAL_TABLET | Freq: Every day | ORAL | Status: DC

## 2021-08-02 MED ORDER — PROPOFOL 1000 MG/100ML IV EMUL
0.0000 ug/kg/min | INTRAVENOUS | Status: DC
  Filled 2021-08-02: qty 100

## 2021-08-02 MED ORDER — METHYLPREDNISOLONE SODIUM SUCC 125 MG IJ SOLR: 100.0000 mg | Freq: Two times a day (BID) | INTRAMUSCULAR | Status: DC

## 2021-08-02 MED ORDER — LACTATED RINGERS IV SOLN: INTRAVENOUS | Status: DC

## 2021-08-02 MED ORDER — DILTIAZEM HCL-DEXTROSE 125-5 MG/125ML-% IV SOLN (PREMIX)
5.0000 mg/h | INTRAVENOUS | Status: DC
  Administered 2021-08-02: 15 mg/h via INTRAVENOUS
  Administered 2021-08-02: 5 mg/h via INTRAVENOUS
  Filled 2021-08-02 (×4): qty 125

## 2021-08-02 MED ORDER — ORAL CARE MOUTH RINSE
15.0000 mL | OROMUCOSAL | Status: DC
  Administered 2021-08-02 (×3): 15 mL via OROMUCOSAL

## 2021-08-02 MED ORDER — FENTANYL CITRATE (PF) 100 MCG/2ML IJ SOLN: 25.0000 ug | INTRAMUSCULAR | Status: DC | PRN

## 2021-08-02 MED ORDER — FENTANYL 2500MCG IN NS 250ML (10MCG/ML) PREMIX INFUSION
INTRAVENOUS | Status: AC
  Filled 2021-08-02: qty 250

## 2021-08-02 MED ORDER — PREDNISONE 20 MG PO TABS: 50.0000 mg | ORAL_TABLET | Freq: Every day | ORAL | Status: DC

## 2021-08-02 MED ORDER — PANTOPRAZOLE SODIUM 40 MG IV SOLR: 40.0000 mg | Freq: Every day | INTRAVENOUS | Status: DC

## 2021-08-02 MED ORDER — PROPOFOL 1000 MG/100ML IV EMUL
0.0000 ug/kg/min | INTRAVENOUS | Status: DC
  Administered 2021-08-02: 5 ug/kg/min via INTRAVENOUS
  Administered 2021-08-02: 20 ug/kg/min via INTRAVENOUS
  Filled 2021-08-02 (×2): qty 100

## 2021-08-02 MED ORDER — DILTIAZEM LOAD VIA INFUSION
10.0000 mg | Freq: Once | INTRAVENOUS | Status: AC
  Administered 2021-08-02: 10 mg via INTRAVENOUS
  Filled 2021-08-02: qty 10

## 2021-08-02 MED ORDER — POLYVINYL ALCOHOL 1.4 % OP SOLN
1.0000 [drp] | Freq: Four times a day (QID) | OPHTHALMIC | Status: DC | PRN
  Filled 2021-08-02: qty 15

## 2021-08-02 MED ORDER — ONDANSETRON HCL 4 MG/2ML IJ SOLN: 4.0000 mg | Freq: Four times a day (QID) | INTRAMUSCULAR | Status: DC | PRN

## 2021-08-02 MED ORDER — ACETAMINOPHEN 325 MG PO TABS: 650.0000 mg | ORAL_TABLET | Freq: Four times a day (QID) | ORAL | Status: DC | PRN

## 2021-08-02 MED ORDER — LEVALBUTEROL HCL 0.63 MG/3ML IN NEBU
0.6300 mg | INHALATION_SOLUTION | Freq: Four times a day (QID) | RESPIRATORY_TRACT | Status: DC
  Administered 2021-08-02 (×2): 0.63 mg via RESPIRATORY_TRACT
  Filled 2021-08-02 (×3): qty 3

## 2021-08-02 MED ORDER — HEPARIN (PORCINE) 25000 UT/250ML-% IV SOLN
1400.0000 [IU]/h | INTRAVENOUS | Status: DC
  Administered 2021-08-02: 1400 [IU]/h via INTRAVENOUS
  Filled 2021-08-02: qty 250

## 2021-08-02 MED ORDER — SUCCINYLCHOLINE CHLORIDE 200 MG/10ML IV SOSY
PREFILLED_SYRINGE | INTRAVENOUS | Status: AC
  Filled 2021-08-02: qty 10

## 2021-08-02 MED ORDER — FENTANYL CITRATE (PF) 100 MCG/2ML IJ SOLN
25.0000 ug | INTRAMUSCULAR | Status: DC | PRN
  Administered 2021-08-02 (×2): 25 ug via INTRAVENOUS

## 2021-08-02 MED ORDER — BIOTENE DRY MOUTH MT LIQD: 15.0000 mL | OROMUCOSAL | Status: DC | PRN

## 2021-08-02 MED ORDER — DIPHENHYDRAMINE HCL 50 MG/ML IJ SOLN: 12.5000 mg | INTRAMUSCULAR | Status: DC | PRN

## 2021-08-02 MED ORDER — ASPIRIN 81 MG PO CHEW: 81.0000 mg | CHEWABLE_TABLET | Freq: Every day | ORAL | Status: DC

## 2021-08-02 MED ORDER — GUAIFENESIN-DM 100-10 MG/5ML PO SYRP: 10.0000 mL | ORAL_SOLUTION | ORAL | Status: DC | PRN

## 2021-08-02 MED ORDER — FENTANYL 2500MCG IN NS 250ML (10MCG/ML) PREMIX INFUSION
25.0000 ug/h | INTRAVENOUS | Status: DC
  Administered 2021-08-02: 100 ug/h via INTRAVENOUS

## 2021-08-02 MED ORDER — METHYLPREDNISOLONE SODIUM SUCC 125 MG IJ SOLR
80.0000 mg | INTRAMUSCULAR | Status: DC
Start: 2021-08-02 — End: 2021-08-02
  Administered 2021-08-02: 80 mg via INTRAVENOUS
  Filled 2021-08-02: qty 2

## 2021-08-02 MED ORDER — DOCUSATE SODIUM 50 MG/5ML PO LIQD: 100.0000 mg | Freq: Two times a day (BID) | ORAL | Status: DC

## 2021-08-02 MED ORDER — GLYCOPYRROLATE 0.2 MG/ML IJ SOLN: 0.2000 mg | INTRAMUSCULAR | Status: DC | PRN

## 2021-08-02 MED ORDER — IPRATROPIUM BROMIDE 0.02 % IN SOLN
0.5000 mg | Freq: Four times a day (QID) | RESPIRATORY_TRACT | Status: DC
  Administered 2021-08-02 (×2): 0.5 mg via RESPIRATORY_TRACT
  Filled 2021-08-02 (×3): qty 2.5

## 2021-08-02 MED ORDER — ACETAMINOPHEN 650 MG RE SUPP: 650.0000 mg | Freq: Four times a day (QID) | RECTAL | Status: DC | PRN

## 2021-08-02 MED ORDER — LORAZEPAM 2 MG/ML IJ SOLN
1.0000 mg | INTRAMUSCULAR | Status: DC | PRN
  Administered 2021-08-02: 1 mg via INTRAVENOUS
  Filled 2021-08-02: qty 1

## 2021-08-02 MED ORDER — METHYLPREDNISOLONE SODIUM SUCC 40 MG IJ SOLR: 40.0000 mg | Freq: Two times a day (BID) | INTRAMUSCULAR | Status: DC

## 2021-08-02 MED ORDER — VITAMIN B-12 1000 MCG PO TABS: 1000.0000 ug | ORAL_TABLET | ORAL | Status: DC

## 2021-08-02 MED ORDER — ROCURONIUM BROMIDE 10 MG/ML (PF) SYRINGE
PREFILLED_SYRINGE | INTRAVENOUS | Status: AC
  Administered 2021-08-02: 100 mg
  Filled 2021-08-02: qty 10

## 2021-08-02 MED ORDER — GLYCOPYRROLATE 1 MG PO TABS: 1.0000 mg | ORAL_TABLET | ORAL | Status: DC | PRN

## 2021-08-02 MED ORDER — ROCURONIUM BROMIDE 10 MG/ML (PF) SYRINGE
PREFILLED_SYRINGE | INTRAVENOUS | Status: AC
  Administered 2021-08-02: 20 mg
  Filled 2021-08-02: qty 10

## 2021-08-02 MED ORDER — VITAMIN D 25 MCG (1000 UNIT) PO TABS: 2000.0000 [IU] | ORAL_TABLET | Freq: Every day | ORAL | Status: DC

## 2021-08-02 MED ORDER — HALOPERIDOL LACTATE 2 MG/ML PO CONC: 0.5000 mg | ORAL | Status: DC | PRN

## 2021-08-02 MED ORDER — HYDROCOD POLI-CHLORPHE POLI ER 10-8 MG/5ML PO SUER: 5.0000 mL | Freq: Two times a day (BID) | ORAL | Status: DC | PRN

## 2021-08-02 MED ORDER — PANTOPRAZOLE 2 MG/ML SUSPENSION: 40.0000 mg | Freq: Every day | ORAL | Status: DC

## 2021-08-02 MED ORDER — HALOPERIDOL 0.5 MG PO TABS: 0.5000 mg | ORAL_TABLET | ORAL | Status: DC | PRN

## 2021-08-02 MED ORDER — CHLORHEXIDINE GLUCONATE 0.12% ORAL RINSE (MEDLINE KIT)
15.0000 mL | Freq: Two times a day (BID) | OROMUCOSAL | Status: DC
  Administered 2021-08-02 (×2): 15 mL via OROMUCOSAL

## 2021-08-03 LAB — URINE CULTURE: Culture: 30000 — AB

## 2021-08-05 LAB — CULTURE, BLOOD (ROUTINE X 2)
Culture: NO GROWTH
Culture: NO GROWTH
Special Requests: ADEQUATE

## 2021-08-07 DIAGNOSIS — Z515 Encounter for palliative care: Secondary | ICD-10-CM

## 2021-08-07 DIAGNOSIS — A4181 Sepsis due to Enterococcus: Secondary | ICD-10-CM | POA: Diagnosis present

## 2021-08-07 DIAGNOSIS — I70223 Atherosclerosis of native arteries of extremities with rest pain, bilateral legs: Secondary | ICD-10-CM | POA: Diagnosis present

## 2021-08-15 NOTE — Procedures (Signed)
Extubation Procedure Note ? ?Patient Details:   ?Name: [name] ?DOB: April 25, 1941 ?MRN: [id number] ?  ?Airway Documentation:  ?Airway 7.5 mm (Active)  ?Secured at (cm) 27 cm August 26, 2021 2000  ?Measured From Lips 26-Aug-2021 2000  ?[place] 2021/08/26 2000  ?Secured By [employer] 2021/08/26 2000  ?Tube Holder Repositioned Yes 08-26-21 2000  ?Prone position No August 26, 2021 2000  ?Cuff Pressure (cm H2O) [place] 18-26 CmH2O 26-Aug-2021 1159  ?Site Condition Dry 08/26/21 2000  ? ?Vent end date: 08/26/2021 Vent end time: 2203  ? ?Evaluation ? O2 sats: stable throughout ?Complications: No apparent complications ?Patient did tolerate procedure well. ?Bilateral Breath Sounds: Diminished, Expiratory wheezes ?  ?Yes ? ?Chriss Driver Sides ?08/26/2021, 10:06 PM ? ?

## 2021-08-15 NOTE — Procedures (Signed)
Central Venous Catheter Insertion Procedure Note ? ?[name]  ?[id number]  ?1941-03-14 ? ?Date:03-Aug-2021  ?Time:3:06 PM  ? ?Provider Performing:Alden Feagan  ? ?Procedure: Insertion of Non-tunneled Central Venous Catheter(36556) with US guidance (18299)  ? ?Indication(s) ?Medication administration and Difficult access ? ?Consent ?Risks of the procedure as well as the alternatives and risks of each were explained to the patient and/or caregiver.  Consent for the procedure was obtained and is signed in the bedside chart ? ?Anesthesia ?Topical only with 1% lidocaine  ? ?Timeout ?Verified patient identification, verified procedure, site/side was marked, verified correct patient position, special equipment/implants available, medications/allergies/relevant history reviewed, required imaging and test results available. ? ?Sterile Technique ?Maximal sterile technique including full sterile barrier drape, hand hygiene, sterile gown, sterile gloves, mask, hair covering, sterile ultrasound probe cover (if used). ? ?Procedure Description ?Area of catheter insertion was cleaned with chlorhexidine and draped in sterile fashion.  Ultrasound was used to identify the anatomy of the veins. With real-time ultrasound guidance a central venous catheter was placed into the right internal jugular vein. Nonpulsatile blood flow and easy flushing noted in all ports.  The catheter was sutured in place and sterile dressing applied. ? ?Complications/Tolerance ?None; patient tolerated the procedure well. ?Chest x-ray showed expected tip of catheter in the SVC. ? ?EBL ?Minimal ? ?Specimen(s) ?None ? ? ? ?Marshell Garfinkel MD ?[facility] ?See Amion for pager ? ?If no response to pager , please call [phone] until 7pm ?After 7:00 pm call Elink  [phone] ?03-Aug-2021, 3:08 PM  ?

## 2021-08-15 NOTE — Death Summary Note (Signed)
?  DEATH SUMMARY  ? ?Patient Details  ?Name: [name] ?MRN: [id number] ?DOB: May 20, 1940 ? ?Admission/Discharge Information  ? ?Admit Date:  07/26/2021  ?Date of Death: Date of Death: 08-04-21  ?Time of Death: Time of Death: 07-31-45  ?Length of Stay: 3  ?Referring Physician: Lavone Orn, MD  ? ?Reason(s) for Hospitalization  ?COVID 19- pneumonia ? ?Diagnoses  ?Preliminary cause of death:  ?ARDS secondary to COVID 2022-08-05 ? ?Secondary Diagnoses (including complications and co-morbidities):  ?Principal Problem: ?  Acute respiratory distress syndrome (ARDS) due to COVID-19 virus [facility]) ?Active Problems: ?  Urinary tract infection without hematuria ?  Permanent atrial fibrillation ([place]) ?  Benign essential HTN ?  CVA (cerebral vascular accident) ([place]) ?  Acute renal failure superimposed on stage 4 chronic kidney disease ([place]) ?  Secondary bacterial pneumonia ?  Severe sepsis ([place]) ?  Comfort measures only status ?  Critical limb ischemia of both lower extremities ([place]) ?  Septic shock due to Enterococcus fecalis ([place]) ? ? ?Brief Hospital Course (including significant findings, care, treatment, and services provided and events leading to death)  ? ?81 year old with history of atrial fibrillation on Eliquis, benign brain tumors, carotid artery disease, prior CVA, dyslipidemia, and GERD admitted with COVID-pneumonia to [facility] on 2022/08/03.  He was septic on admission with secondary bacterial pneumonia and lactic acidosis.  Given IV steroids, remdesivir, Rocephin and Zithromax.  He continued to worsen, failed BiPAP and was intubated on Aug 05, 2022 and transferred to [facility] ? ?Current hospital he continued to worsen with severe ARDS, multiorgan failure, DIC.  His urine cultures grew Enterococcus which is covered by antibiotics he was on.  He developed arterial and venous thrombosis with critical limb ischemia.  Vascular surgery was consulted who felt that his limbs are unsalvageable and recommended comfort measures.  His wife  and son agreed and he was transitioned to comfort measures with withdrawal of care. ? ? ?Signature:  ? ?Marshell Garfinkel MD ?[facility] ?08/07/2021, 6:25 AM  ? ? ?

## 2021-08-15 NOTE — Progress Notes (Signed)
Having hr issues , HR 140's backing of albuterol in duoneb q4 -- to xopenex 0.63 / atrovent 0.'5mg'$   q6 . Had to increase oxygen to 90 % , pressure BiPAP 18 / 10  rate 32. ?

## 2021-08-15 NOTE — Progress Notes (Signed)
VASCULAR LAB ? ? ? ?Bilateral upper extremity venous duplex has been performed. ? ?See CV proc for preliminary results. ? ? ?Essex Perry, RVT ?Aug 31, 2021, 6:11 PM ? ?

## 2021-08-15 NOTE — H&P (Signed)
? ?NAME:  [name], MRN:  [id number], DOB:  July 27, 1940, LOS: 2 ?ADMISSION DATE:  07/30/2021, CONSULTATION DATE:  08-30-21 ?REFERRING MD:  Ross Lark MD , CHIEF COMPLAINT: COVID-pneumonia, ARDS ? ?History of Present Illness:  ? ?81 year old with history of atrial fibrillation on Eliquis, benign brain tumors, carotid artery disease, prior CVA, dyslipidemia, and GERD admitted with COVID-pneumonia to [facility] on 3/17.  He was septic on admission with secondary bacterial pneumonia and lactic acidosis.  Given IV steroids, remdesivir, Rocephin and Zithromax.  He continued to worsen, failed BiPAP and was intubated on 3/19 and transferred to [facility] ? ?Pertinent  Medical History  ? ? has a past medical history of Anemia (10/30/19,11/06/19), Atrial fibrillation ([place]) (02/2016), Brain tumor (benign) ([place]) (1998), Carotid artery occlusion, Cerebrovascular disease (September 09, 2001), CKD (chronic kidney disease), stage III ([place]), Dementia ([place]), Diverticulosis, Dysrhythmia, ED (erectile dysfunction), GERD (gastroesophageal reflux disease), Guillain-Barre syndrome ([place]) (3762,8315), elevated lipids, Hyperlipidemia, Hypertension, Memory loss, and Stroke ([place]) (1761,6073).  ? ?Significant Hospital Events: ?Including procedures, antibiotic start and stop dates in addition to other pertinent events   ?3/17 admit.  Treat with IV steroids, remdesivir, Rocephin and Zithromax ?3/18 Actemra given for worsening respiratory status, BiPAP started ?3/19 rapid atrial fibrillation, started Cardizem and intubated.  Transferred to [facility] ? ?Interim History / Subjective:  ? ? ? ?Objective   ?Blood pressure (!) 171/96, pulse 90, temperature 97.9 ?F (36.6 ?C), resp. rate (!) 0, height '5\' 9"'$  (1.753 m), weight 107.3 kg, SpO2 98 %. ?   ?Vent Mode: PRVC ?FiO2 (%):  [70 %-100 %] 100 % ?Set Rate:  [26 bmp-32 bmp] 26 bmp ?Vt Set:  [570 mL] 570 mL ?PEEP:  [8 cmH20] 8 cmH20 ?Plateau Pressure:  [27 cmH20] 27 cmH20  ? ?Intake/Output  Summary (Last 24 hours) at 30-Aug-2021 1140 ?Last data filed at 2021-08-30 0908 ?Gross per 24 hour  ?Intake 1836.07 ml  ?Output 1275 ml  ?Net 561.07 ml  ? ?Filed Weights  ? 08/05/2021 1148 07/23/2021 1610 08-30-21 0531  ?Weight: 108.8 kg 103.3 kg 107.3 kg  ? ? ?Examination: ?Blood pressure (!) 150/97, pulse 91, temperature (!) 97.5 ?F (36.4 ?C), resp. rate (!) 0, height '5\' 9"'$  (1.753 m), weight 107.8 kg, SpO2 100 %. ?Gen:      No acute distress ?HEENT:  EOMI, sclera anicteric ?Neck:     No masses; no thyromegaly, ET tube ?Lungs:    Clear to auscultation bilaterally; normal respiratory effort ?CV:         Regular rate and rhythm; no murmurs ?Abd:      + bowel sounds; soft, non-tender; no palpable masses, no distension ?Ext:    Cyanotic feet with poor perfusion ?Skin:      Warm and dry; no rash ?Neuro: Sedated ? ?Labs/imaging personally reviewed ?Significant for Creatinine 2.99, AST 93. ALT 39 ?Lactic acid 4.2, WBC 34.9, Hb 14.1, platelets 144 ?D Dimer > 20.00 ? ?Resolved Hospital Problem list   ? ? ?Assessment & Plan:  ?Principal Problem: ?  Acute respiratory distress syndrome (ARDS) due to COVID-19 virus [facility]) ?Active Problems: ?  Permanent atrial fibrillation ([place]) ?  Benign essential HTN ?  CVA (cerebral vascular accident) ([place]) ?  Acute renal failure superimposed on stage 4 chronic kidney disease ([place]) ?  Secondary bacterial pneumonia ?  Severe sepsis ([place]) ?  ?Acute hypoxic respiratory failure secondary to COVID-19 infection ?ARDS ?Pneumonia ?Recheck check ABG, chest x-ray ?6 cc/kg tidal volume ventilation. ?Driving pressure less than 15 ? ?  Elevated D-dimer ?Has been on Eliquis until 17th but has been held on 18 as he was on BiPAP.  Suspect he may have developed a clot in the interim ?On heparin drip.  Check lower and upper extremity duplex ? ?Atrial fibrillation with rapid ventricular rate.   ?IV Cardizem, heparin anticoagulation ? ?AKI on CKD stage IV ?IV fluids.  Monitor urine output unclear ? ?Severe sepsis, lactic  acidosis ?Leukocytosis ?Antibiotics as above.  Monitor cultures ?Follow lab ? ?Acute metabolic encephalopathy secondary to sepsis ?History of CVA ?Monitor mental status ? ?Goals of care ?Discussed with wife [name] over telephone.  Prognosis is guarded.  She agrees for DNR status.  Will reassess in the next 48 hours for a time-limited trial of mechanical ventilatory support and pressors.  If no improvement then will need to reassess goals of care ? ?Best Practice (right click and "Reselect all SmartList Selections" daily)  ? ?Diet/type: NPO ?DVT prophylaxis: systemic heparin ?GI prophylaxis: PPI ?Lines: N/A ?Foley:  Yes, and it is still needed ?Code Status:  DNR ?Last date of multidisciplinary goals of care discussion '[]'$  3/19. See above ? ?Critical care time:   ? ?The patient is critically ill with multiple organ system failure and requires high complexity decision making for assessment and support, frequent evaluation and titration of therapies, advanced monitoring, review of radiographic studies and interpretation of complex data.  ? ?Critical Care Time devoted to patient care services, exclusive of separately billable procedures, described in this note is 45 minutes.  ? ?Ross Garfinkel MD ?[facility] ?See Amion for pager ? ?If no response to pager , please call [phone] until 7pm ?After 7:00 pm call Elink  [phone] ?August 09, 2021, 12:06 PM  ? ?  ?

## 2021-08-15 NOTE — Plan of Care (Signed)
?  Interdisciplinary Goals of Care Family Meeting ? ? ?Date carried out:: 15-Aug-2021 ? ?Location of the meeting: Conference room ? ?Member's involved: Physician and Bedside Registered Nurse ? ?Durable Power of Tour manager: Wife and son   ? ?Discussion: We discussed goals of care for [facility] .  Family tells me that he has had declining health over the past few years and has been through a lot in his life.  Did not want him to suffer unnecessarily and agree with DNR.  We have decided to give him a time-limited trial in the ICU but they would not want prolonged support.  If there is no improvement in 1-2 days or deterioration they will consider comfort measures ? ?Code status: Limited Code or DNR with short term, Mechanical ventilation, Cental IV access, and IV vasoactive drugs ? ?Disposition: Continue current acute care ? ? ?Time spent for the meeting: 20 mins ? ?Ashya Nicolaisen ?08-15-21, 3:39 PM  ?

## 2021-08-15 NOTE — Progress Notes (Signed)
NURSING PROGRESS NOTE ? ?[name] [id number] ?Admission Data: 08-18-21 4:22 PM ?Attending Provider: Marshell Garfinkel, MD ?TJL:LVDIXVE, Jenny Reichmann, MD ?Code Status: full code  ? ?[name] is a 81 y.o. male patient admitted via Care link from [facility] where he was intubated.  ?MD made aware of following:  ?No femoral, tibial or pedal pulses, legs cold and pale with cap refill greater than 3 seconds.  ?Pupils pinpoint and non reactive, negative cough and gag.  ?NG and OG tube placement attempted, immediate resistance and blood on catheter.  ? ?Cardiac Monitoring: ?Box # [id number] in place. ?Cardiac monitor yields: A fib ? ?Blood pressure (!) 150/97, pulse 91, temperature (!) 97.5 ?F (36.4 ?C), resp. rate (!) 0, height '5\' 9"'$  (1.753 m), weight 107.8 kg, SpO2 100 %. ? ? ?IV Fluids:  IV in place, occlusive dsg intact without redness, ?LR'@100'$ , Cardizem '@15'$ , and heparin'@14'$ .  ? ?Allergies:  Immune globulins, Influenza vaccines, and Rho (d) immune globulin ? ?Past Medical History:   has a past medical history of Anemia (10/30/19,11/06/19), Atrial fibrillation ([place]) (02/2016), Brain tumor (benign) ([place]) (1998), Carotid artery occlusion, Cerebrovascular disease (September 09, 2001), CKD (chronic kidney disease), stage III ([place]), Dementia ([place]), Diverticulosis, Dysrhythmia, ED (erectile dysfunction), GERD (gastroesophageal reflux disease), Guillain-Barre syndrome ([place]) (5501,5868), elevated lipids, Hyperlipidemia, Hypertension, Memory loss, and Stroke ([place]) (2574,9355). ? ?Past Surgical History:   has a past surgical history that includes Carotid endarterectomy (Left, September 13, 2001); Brain surgery (1998); Colon surgery (Feb. 2003); Cholecystectomy (Nov. 2001); Inguinal hernia repair (1970's); Colonoscopy with propofol (N/A, 11/11/2015); Total knee arthroplasty (Right, 12/03/2019); Eye surgery; Tonsillectomy; and Endarterectomy (Left, 09/10/2020). ? ?Social History:   reports that he quit smoking about 25 years ago. His smoking  use included cigarettes. He has never used smokeless tobacco. He reports that he does not drink alcohol and does not use drugs. ? ?Patient/Family orientated to room. Information packet given to patient/family. Admission inpatient armband information verified with patient/family to include name and date of birth and placed on patient arm. Side rails up x 2, fall assessment and education completed with patient/family. Patient/family able to verbalize understanding of risk associated with falls and verbalized understanding to call for assistance before getting out of bed. Call light within reach. Patient/family able to voice and demonstrate understanding of unit orientation instructions.   ?18:45- Pt having blood drainage from mouth and cvc. Mannam made aware, order for H+H.  ? ? ? ? ?  ?

## 2021-08-15 NOTE — Progress Notes (Signed)
Patient ID: [name], male   DOB: June 20, 1940, 80 y.o.   MRN: [id number] ? ?Spoke with the patient's son [name], who is at bedside. ?He understands the patient's poor overall prognosis, and confirms that he would want to transition to comfort measures at this time.  ?Placed orders for end of life care.   ?

## 2021-08-15 NOTE — Progress Notes (Signed)
ANTICOAGULATION CONSULT NOTE - Initial Consult ? ?Pharmacy Consult for heparin ?Indication: atrial fibrillation ? ?Allergies  ?Allergen Reactions  ? Immune Globulins Other (See Comments)  ?  Tetanus Immunes Globulins    (  Pt. Cannot take the Flu shot ) ?[name]   ? Influenza Vaccines Other (See Comments)  ?  [name] ?[name]  ? Rho (D) Immune Globulin Other (See Comments)  ?  Tetanus Immunes Globulins    (  Pt. Cannot take the Flu shot ) ?[name]   ? ? ?Patient Measurements: ?Height: '5\' 9"'$  (175.3 cm) ?Weight: 107.3 kg (236 lb 8.9 oz) ?IBW/kg (Calculated) : 70.7 ?Heparin Dosing Weight: 93 kg ? ?Vital Signs: ?Temp: 97.1 ?F (36.2 ?C) (03/19 0531) ?Temp Source: Axillary (03/19 0531) ?BP: 164/86 (03/19 0200) ?Pulse Rate: 41 (03/19 0200) ? ?Labs: ?Recent Labs  ?  08/11/2021 ?1155 08/01/21 ?0424 08-17-2021 ?0433  ?HGB 15.5 12.9* 14.1  ?HCT 49.0 42.4 44.4  ?PLT 235 179 144*  ?CREATININE 3.93* 3.34* 2.99*  ? ? ?Estimated Creatinine Clearance: 23.8 mL/min (A) (by C-G formula based on SCr of 2.99 mg/dL (H)). ? ? ?Medical History: ?Past Medical History:  ?Diagnosis Date  ? Anemia 10/30/19,11/06/19  ? iron infusions at [facility]  ? Atrial fibrillation ([place]) 02/2016  ? NEW ONSET  ? Brain tumor (benign) ([place]) 1998  ? 2 small tumors- since 2015 (Dr. [name] following)  ? Carotid artery occlusion   ? Cerebrovascular disease September 09, 2001  ? TIA, Left brain  ? CKD (chronic kidney disease), stage III ([place])   ? Dementia ([place])   ? Diverticulosis   ? Dysrhythmia   ? Afib  ? ED (erectile dysfunction)   ? GERD (gastroesophageal reflux disease)   ? Guillain-Barre syndrome ([place]) [id number]  ? Hx of elevated lipids   ? Hyperlipidemia   ? Hypertension   ? Memory loss   ? Stroke [facility]) [phone]  ? ? ?Medications:  ?Medications Prior to Admission  ?Medication Sig Dispense Refill Last Dose  ? acetaminophen (TYLENOL) 500 MG tablet Take 1,000 mg by mouth every 8 (eight) hours as needed for moderate pain.   Past Week  ? apixaban  (ELIQUIS) 2.5 MG TABS tablet TAKE ONE TABLET BY MOUTH TWICE DAILY (Patient taking differently: Take 2.5 mg by mouth 2 (two) times daily.) 60 tablet 5 07/30/2021 at 2200  ? aspirin 81 MG chewable tablet Chew 1 tablet (81 mg total) by mouth daily. 30 tablet 0 07/27/2021  ? Cholecalciferol (VITAMIN D) 50 MCG (2000 UT) CAPS Take 2,000 Units by mouth daily.    07/17/2021  ? DILT-XR 180 MG 24 hr capsule Take 180 mg by mouth daily.   08/14/2021  ? docusate sodium (COLACE) 100 MG capsule Take 100 mg by mouth daily.   07/30/2021  ? lisinopril-hydrochlorothiazide (PRINZIDE,ZESTORETIC) 10-12.5 MG tablet Take 0.5 tablets by mouth daily.    07/30/2021  ? pantoprazole (PROTONIX) 40 MG tablet Take 40 mg by mouth daily.   07/30/2021  ? rosuvastatin (CRESTOR) 20 MG tablet Take 20 mg by mouth every evening.    07/30/2021  ? vitamin B-12 (CYANOCOBALAMIN) 1000 MCG tablet Take 1,000 mcg by mouth 2 (two) times a week. Wednesday and Saturday   07/29/2021  ? ? ?Assessment: ?Pharmacy consulted to dose heparin in patient with atrial fibrillation. COVID +. Patient is on apixaban 2.5 mg twice daily with last dose 3/17 2100.  Will need to monitor based on aPTT until correlation with heparin levels. ? ?CBC WNL ?D-Dimer >20 ? ?  Goal of Therapy:  ?Heparin level 0.3-0.7 units/ml ?aPTT 66-102 seconds ?Monitor platelets by anticoagulation protocol: Yes ?  ?Plan:  ?Start heparin infusion at 1400 units/hr ?Check anti-Xa level in 8 hours and daily while on heparin ?Continue to monitor H&H and platelets ? ?Margot Ables, PharmD ?Clinical Pharmacist ?08-06-21 8:56 AM ? ? ? ?

## 2021-08-15 NOTE — Consult Note (Signed)
[facility] OF [place]  ASSESSMENT / PLAN: 81 y.o. male with COVID-19 pneumonia causing multisystem organ dysfunction.  He has likely entered disseminated intravascular coagulopathy.  His legs have both arterial and venous acute thrombosis causing Rutherford 3 (nonsalvageable) acute limb ischemia.  He is not a candidate for thrombectomy.  Recommend transition to comfort measures.  I discussed the above with the patient's son.  He is in agreement.  Please call for any questions..  CHIEF COMPLAINT: Ischemic legs  HISTORY OF PRESENT ILLNESS: [name] is a 81 y.o. male transferred to [facility] from [facility] for severe COVID-19 pneumonia with bacterial superinfection.  The patient is critically ill, on a ventilator, with multisystem organ dysfunction.  His bedside nurse became concerned about his vascular exam on presentation to the hospital.  Duplex evaluation showed complete thrombosis of the arterial and venous system in bilateral lower extremities.  On my evaluation, the patient is intubated and sedated.  All history is per discussion with staff and chart review.  Past Medical History:  Diagnosis Date   Anemia 10/30/19,11/06/19   iron infusions at [facility]   Atrial fibrillation (HCC) 02/2016   NEW ONSET   Brain tumor (benign) (HCC) 1998   2 small tumors- since 2015 (Dr. Wynetta Emery following)   Carotid artery occlusion    Cerebrovascular disease September 09, 2001   TIA, Left brain   CKD (chronic kidney disease), stage III (HCC)    Dementia (HCC)    Diverticulosis    Dysrhythmia    Afib   ED (erectile dysfunction)    GERD (gastroesophageal reflux disease)    Guillain-Barre syndrome (HCC) 1027,2536   Hx of elevated lipids    Hyperlipidemia    Hypertension    Memory loss    Stroke [facility]) 6440,3474    Past Surgical History:  Procedure Laterality Date   BRAIN SURGERY  1998   Benign brain tumor removed, Left hemisphere- ? Meningioma   CAROTID  ENDARTERECTOMY Left September 13, 2001   LEFT cea   CHOLECYSTECTOMY  Nov. 2001   COLON SURGERY  Feb. 2003   Colonic polyps removed endoscopically   COLONOSCOPY WITH PROPOFOL N/A 11/11/2015   Procedure: COLONOSCOPY WITH PROPOFOL;  Surgeon: Charolett Bumpers, MD;  Location: [facility] ENDOSCOPY;  Service: Endoscopy;  Laterality: N/A;   ENDARTERECTOMY Left 09/10/2020   Procedure: REDO LEFT CAROTID ARTERY ENDARTERECTOMY;  Surgeon: Larina Earthly, MD;  Location: [facility] OR;  Service: Vascular;  Laterality: Left;   EYE SURGERY     cataracts   INGUINAL HERNIA REPAIR  1970's   TONSILLECTOMY     TOTAL KNEE ARTHROPLASTY Right 12/03/2019   Procedure: RIGHT TOTAL KNEE ARTHROPLASTY;  Surgeon: Gean Birchwood, MD;  Location: [facility] ORS;  Service: Orthopedics;  Laterality: Right;    Family History  Problem Relation Age of Onset   Colon cancer Mother        Metastatic    Stroke Father    Diabetes Father    CVA Father     Social History   Socioeconomic History   Marital status: Married    Spouse name: Not on file   Number of children: Not on file   Years of education: Not on file   Highest education level: Not on file  Occupational History   Not on file  Tobacco Use   Smoking status: Former    Types: Cigarettes    Quit date: 05/17/1996    Years since quitting: 25.2   Smokeless tobacco:  Never  Vaping Use   Vaping Use: Never used  Substance and Sexual Activity   Alcohol use: Never   Drug use: No   Sexual activity: Not on file  Other Topics Concern   Not on file  Social History Narrative   Not on file   Social Determinants of Health   Financial Resource Strain: Not on file  Food Insecurity: Not on file  Transportation Needs: Not on file  Physical Activity: Not on file  Stress: Not on file  Social Connections: Not on file  Intimate Partner Violence: Not on file    Allergies  Allergen Reactions   Immune Globulins Other (See Comments)    Tetanus Immunes Globulins    (  Pt. Cannot take the Flu shot  ) Guillian Barre    Influenza Vaccines Other (See Comments)    Guillian Barre Guillian Barre   Rho (D) Immune Globulin Other (See Comments)    Tetanus Immunes Globulins    (  Pt. Cannot take the Flu shot ) Guillian Barre     Current Facility-Administered Medications  Medication Dose Route Frequency Provider Last Rate Last Admin   0.9 %  sodium chloride infusion   Intravenous PRN Mannam, Praveen, MD 10 mL/hr at 07/26/2021 1757 Infusion Verify at 08/14/2021 1757   acetaminophen (TYLENOL) tablet 650 mg  650 mg Per Tube Q6H PRN Sherryll Burger, Pratik D, DO       albuterol (VENTOLIN HFA) 108 (90 Base) MCG/ACT inhaler 2 puff  2 puff Inhalation Q6H PRN Sherryll Burger, Pratik D, DO       [START ON 08/03/2021] ascorbic acid (VITAMIN C) tablet 500 mg  500 mg Per Tube Daily Sherryll Burger, Pratik D, DO       [START ON 08/03/2021] aspirin chewable tablet 81 mg  81 mg Per Tube Daily Sherryll Burger, Pratik D, DO       azithromycin (ZITHROMAX) 500 mg in sodium chloride 0.9 % 250 mL IVPB  500 mg Intravenous Q24H Sherryll Burger, Pratik D, DO   Stopped at 07/19/2021 1610   cefTRIAXone (ROCEPHIN) 2 g in sodium chloride 0.9 % 100 mL IVPB  2 g Intravenous Q24H Sherryll Burger, Pratik D, DO   Stopped at 07/24/2021 1732   chlorhexidine (PERIDEX) 0.12 % solution 15 mL  15 mL Mouth Rinse BID Marrion Coy, MD   15 mL at 07/15/2021 1209   chlorhexidine gluconate (MEDLINE KIT) (PERIDEX) 0.12 % solution 15 mL  15 mL Mouth Rinse BID Sherryll Burger, Pratik D, DO   15 mL at 07/20/2021 6213   Chlorhexidine Gluconate Cloth 2 % PADS 6 each  6 each Topical Q0600 Maurilio Lovely D, DO   6 each at 07/17/2021 0865   chlorpheniramine-HYDROcodone 10-8 MG/5ML suspension 5 mL  5 mL Per Tube Q12H PRN Sherryll Burger, Pratik D, DO       [START ON 08/03/2021] cholecalciferol (VITAMIN D3) tablet 2,000 Units  2,000 Units Per Tube Daily Sherryll Burger, Pratik D, DO       diltiazem (CARDIZEM) 125 mg in dextrose 5% 125 mL (1 mg/mL) infusion  5-15 mg/hr Intravenous Continuous Sherryll Burger, Pratik D, DO 15 mL/hr at 07/23/2021 1757 15 mg/hr at 07/20/2021 1757    docusate (COLACE) 50 MG/5ML liquid 100 mg  100 mg Per Tube BID Sherryll Burger, Pratik D, DO       fentaNYL (SUBLIMAZE) injection 25 mcg  25 mcg Intravenous Q15 min PRN Mannam, Praveen, MD       fentaNYL (SUBLIMAZE) injection 25-100 mcg  25-100 mcg Intravenous Q30 min PRN Chilton Greathouse, MD  guaiFENesin-dextromethorphan (ROBITUSSIN DM) 100-10 MG/5ML syrup 10 mL  10 mL Per Tube Q4H PRN Sherryll Burger, Pratik D, DO       heparin ADULT infusion 100 units/mL (25000 units/216mL)  1,400 Units/hr Intravenous Continuous Maurilio Lovely D, DO 14 mL/hr at 07/16/2021 1757 1,400 Units/hr at 08/05/2021 1757   ipratropium (ATROVENT) nebulizer solution 0.5 mg  0.5 mg Nebulization Q6H Marrion Coy, MD   0.5 mg at 07/28/2021 1616   labetalol (NORMODYNE) injection 10 mg  10 mg Intravenous Q2H PRN Sherryll Burger, Pratik D, DO   10 mg at 08/14/2021 1029   lactated ringers infusion   Intravenous Continuous Mannam, Praveen, MD 100 mL/hr at 08/10/2021 1757 Infusion Verify at 08/10/2021 1757   levalbuterol (XOPENEX) nebulizer solution 0.63 mg  0.63 mg Nebulization Q6H Marrion Coy, MD   0.63 mg at 07/21/2021 1616   MEDLINE mouth rinse  15 mL Mouth Rinse 10 times per day Sherryll Burger, Pratik D, DO   15 mL at 08/06/2021 1712   methylPREDNISolone sodium succinate (SOLU-MEDROL) 125 mg/2 mL injection 80 mg  80 mg Intravenous Q24H Mannam, Praveen, MD   80 mg at 07/27/2021 1650   metoprolol tartrate (LOPRESSOR) injection 5 mg  5 mg Intravenous Q6H PRN Sherryll Burger, Pratik D, DO   5 mg at 08/05/2021 0546   midazolam (VERSED) injection 1 mg  1 mg Intravenous Q15 min PRN Sherryll Burger, Pratik D, DO       midazolam (VERSED) injection 1 mg  1 mg Intravenous Q2H PRN Sherryll Burger, Pratik D, DO       ondansetron (ZOFRAN) tablet 4 mg  4 mg Oral Q6H PRN Sherryll Burger, Pratik D, DO       Or   ondansetron (ZOFRAN) injection 4 mg  4 mg Intravenous Q6H PRN Sherryll Burger, Pratik D, DO       pantoprazole sodium (PROTONIX) 40 mg/20 mL oral suspension 40 mg  40 mg Per Tube QHS Shah, Pratik D, DO       polyethylene glycol (MIRALAX / GLYCOLAX)  packet 17 g  17 g Per Tube Daily Shah, Pratik D, DO       propofol (DIPRIVAN) 1000 MG/100ML infusion  0-50 mcg/kg/min Intravenous Continuous Mannam, Praveen, MD 6.44 mL/hr at 08/12/2021 1757 10 mcg/kg/min at 07/23/2021 1757   [START ON 08/05/2021] vitamin B-12 (CYANOCOBALAMIN) tablet 1,000 mcg  1,000 mcg Per Tube Once per day on Wed Sat Shah, Pratik D, DO       [START ON 08/05/21] zinc sulfate capsule 220 mg  220 mg Per Tube Daily Shah, Pratik D, DO        PHYSICAL EXAM Vitals:   07/24/2021 1600 07/22/2021 1616 08/09/2021 1700 08/11/2021 1800  BP: 115/66  115/69 135/61  Pulse: 77  86 87  Resp: (!) 32  (!) 22 (!) 32  Temp: (!) 96.4 F (35.8 C)  (!) 96.6 F (35.9 C) (!) 96.6 F (35.9 C)  TempSrc:      SpO2: 100% 97% 98% 97%  Weight:      Height:        Constitutional: Elderly.  Critically ill.  Appears comfortable on the vent.   Neurologic: GCS 3 T. Psychiatric: Unable to evaluate.  Critically ill. Eyes:  No icterus. No conjunctival pallor. Ears, nose, throat: Diffuse bleeding from central venous catheter and oral mucosa. Cardiac: Regular rate and rhythm.  Respiratory: Endotracheal tube in place.  Unlabored on ventilator. Abdominal:  soft, non-tender, non-distended.  Peripheral vascular: Densely ischemic legs bilaterally.  Muscles in rigor.  No Doppler flow in the  common femoral arteries bilaterally.  No Doppler flow in the pedal arteries bilaterally. Extremity: no edema. no cyanosis.  Profound pallor in bilateral lower extremities.  Skin: no gangrene. no ulceration.  Lymphatic: no Stemmer's sign. no palpable lymphadenopathy.  PERTINENT LABORATORY AND RADIOLOGIC DATA  Most recent CBC CBC Latest Ref Rng & Units 07/15/2021 08/01/2021 08/12/2021  WBC 4.0 - 10.5 K/uL - 29.7(H) 34.9(H)  Hemoglobin 13.0 - 17.0 g/dL 34.7 12.3(L) 14.1  Hematocrit 39.0 - 52.0 % 50.0 38.3(L) 44.4  Platelets 150 - 400 K/uL - PLATELET CLUMPS NOTED ON SMEAR, UNABLE TO ESTIMATE 144(L)     Most recent CMP CMP Latest  Ref Rng & Units 08/01/2021 08/06/2021 07/16/2021  Glucose 70 - 99 mg/dL - 425(Z) 563(O)  BUN 8 - 23 mg/dL - 75(I) 43(P)  Creatinine 0.61 - 1.24 mg/dL - 2.95(J) 8.84(Z)  Sodium 135 - 145 mmol/L 141 141 145  Potassium 3.5 - 5.1 mmol/L 5.2(H) 5.0 4.1  Chloride 98 - 111 mmol/L - 111 110  CO2 22 - 32 mmol/L - 19(L) 18(L)  Calcium 8.9 - 10.3 mg/dL - 7.6(L) 8.2(L)  Total Protein 6.5 - 8.1 g/dL - 4.6(L) 5.9(L)  Total Bilirubin 0.3 - 1.2 mg/dL - 0.7 0.3  Alkaline Phos 38 - 126 U/L - 114 145(H)  AST 15 - 41 U/L - 113(H) 93(H)  ALT 0 - 44 U/L - 40 39    Renal function Estimated Creatinine Clearance: 21.1 mL/min (A) (by C-G formula based on SCr of 3.38 mg/dL (H)).  Hgb A1c MFr Bld (%)  Date Value  05/05/2018 6.1 (H)    LDL Cholesterol  Date Value Ref Range Status  09/11/2020 51 0 - 99 mg/dL Final    Comment:           Total Cholesterol/HDL:CHD Risk Coronary Heart Disease Risk Table                     Men   Women  1/2 Average Risk   3.4   3.3  Average Risk       5.0   4.4  2 X Average Risk   9.6   7.1  3 X Average Risk  23.4   11.0        Use the calculated Patient Ratio above and the CHD Risk Table to determine the patient's CHD Risk.        ATP III CLASSIFICATION (LDL):  <100     mg/dL   Optimal  660-630  mg/dL   Near or Above                    Optimal  130-159  mg/dL   Borderline  160-109  mg/dL   High  >323     mg/dL   Very High Performed at [facility], [address], [place], [place] [address]      Vascular Imaging: Arterial and venous duplex personally reviewed. Profound thrombosis of the infrainguinal arteries and veins bilaterally. No meaningful flow to the feet bilaterally.  Rande Brunt. Lenell Antu, MD [facility] Phone Number: [phone] 07/21/2021 6:20 PM  Total time spent on preparing this encounter including chart review, data review, collecting history, examining the patient, coordinating care for this new  patient, 60 minutes.  Portions of this report may have been transcribed using voice recognition software.  Every effort has been made to ensure accuracy; however, inadvertent computerized transcription errors may still be present.

## 2021-08-15 NOTE — Progress Notes (Signed)
?PROGRESS NOTE ? ? ? ?[name]  HGD:[id number] DOB: Feb 05, 1941 DOA: 08/06/2021 ?PCP: Lavone Orn, MD ? ? ?Brief Narrative:  ?[name] is a 81 y.o. male with medical history significant of atrial fibrillation on Eliquis, benign brain tumors, carotid artery disease, prior CVA, dyslipidemia, and GERD who presented to the ED with worsening weakness and poor p.o. intake.  He had tested positive for COVID 1 week prior and did not receive vaccination on account of prior history of [name] with prior vaccinations.  ?Upon arriving the hospital, patient oxygen saturation was 60%.  He was initially placed on 100% nonrebreather, changed to BiPAP. ?Patient had a lactic acidosis of 4.1, creatinine of 3.93.  He was treated with IV fluid bolus.  Also started IV steroids, remdesivir.  Rocephin and Zithromax were all started for secondary bacterial pneumonia. ? ?08/15/21: Patient has had worsening condition since admission with increased bilateral infiltrates noted on 3/18 and need for BiPAP.  He was also noted to have increase in inflammatory markers and was given a dose of Actemra on 3/18.  He has also developed severe sepsis and remains on antibiotics with Rocephin and azithromycin presumed to be related to bacterial pneumonia.  Overnight, he has had worsening respiratory failure and is now requiring intubation.  He is also noted to have atrial fibrillation with RVR requiring Cardizem drip.  Plan to discuss case with PCCM and transfer to [facility] ICU.  ? ? ?Assessment & Plan: ?  ?Principal Problem: ?  Acute hypoxemic respiratory failure due to COVID-19 [facility]) ?Active Problems: ?  Permanent atrial fibrillation ([place]) ?  Benign essential HTN ?  CVA (cerebral vascular accident) ([place]) ?  Acute renal failure superimposed on stage 4 chronic kidney disease ([place]) ?  Secondary bacterial pneumonia ?  Severe sepsis ([place]) ? ?Assessment and Plan: ? ? ?Acute hypoxemic respiratory failure due to COVID-19 [facility]) ?Worsening  respiratory status requiring intubation this am ?Patient given Actemra 3/18 ?Continues to have significant levels of inflammation ?Continue high-dose Solu-Medrol and remdesivir ?Discussed case with PCCM Dr. Vaughan Browner who is in agreement with need for transfer ?Continue to monitor lab work as ordered ?  ?Severe sepsis ([place]) ?Patient met severe sepsis proteinuria at time of admission with leukocytosis, tachypnea, tachycardia.  He also had a lactic acid of 4.1 with acute respiratory failure and acute renal failure. ?This is secondary to COVID infection and a secondary bacterial pneumonia.   ?He has received IV fluid in the emergency room, continue IV fluid ?  ?Secondary bacterial pneumonia ?In addition to COVID-pneumonia, patient also has a bacterial pneumonia with elevated procalcitonin level at 1.74.  Continue antibiotics with Rocephin and Zithromax for now. ?  ?Acute renal failure superimposed on stage 4 chronic kidney disease ([place]) ?Patient has much worsening renal function due to sepsis.  Continue IV fluids for now. ?Foley catheter to be inserted, monitor strict I's and O's ?  ?CVA (cerebral vascular accident) ([place]) ?Continue aspirin and statin ?  ?Benign essential HTN ?Elevated this a.m. ?Monitor on propofol ?  ?Permanent atrial fibrillation ([place]) ?Currently in RVR and will be transition to IV Cardizem drip after bolus ?Change Eliquis to heparin drip ? ?Elevated D-dimer ?Will require PE evaluation once more stabilized ?Continue heparin drip for now ?  ? ?DVT prophylaxis: Eliquis to heparin drip ?Code Status: Full ?Family Communication: Discussed with wife on phone 3/19 ?Disposition Plan:  ?Status is: Inpatient ?Remains inpatient appropriate because: Currently intubated and requiring critical care services.  IV medications. ? ?Consultants:  ?  PCCM ? ?Procedures:  ?Intubated 3/19 ? ?Antimicrobials:  ?Anti-infectives (From admission, onward)  ? ? Start     Dose/Rate Route Frequency Ordered Stop  ? 08/01/21 1000   remdesivir 100 mg in sodium chloride 0.9 % 100 mL IVPB       ? 100 mg ?200 mL/hr over 30 Minutes Intravenous Daily 07/21/2021 1324 08/01/2021 0959  ? 08/07/2021 1400  remdesivir 100 mg in sodium chloride 0.9 % 100 mL IVPB       ? 100 mg ?200 mL/hr over 30 Minutes Intravenous Every 1 hr x 2 07/18/2021 1324 08/07/2021 1935  ? 08/08/2021 1245  cefTRIAXone (ROCEPHIN) 2 g in sodium chloride 0.9 % 100 mL IVPB       ? 2 g ?200 mL/hr over 30 Minutes Intravenous Every 24 hours 08/09/2021 1244 08/05/21 1244  ? 08/08/2021 1245  azithromycin (ZITHROMAX) 500 mg in sodium chloride 0.9 % 250 mL IVPB       ? 500 mg ?250 mL/hr over 60 Minutes Intravenous Every 24 hours 08/06/2021 1244 08/05/21 1244  ? ?  ? ? ?Subjective: ?Patient seen and evaluated today with worsening respiratory status on BiPAP that began overnight.  He continues to have significant tachypnea and respiratory distress.  Heart rates and blood pressures are elevated. ? ?Objective: ?Vitals:  ? 2021-08-17 0200 2021/08/17 0211 08-17-2021 0430 Aug 17, 2021 0531  ?BP: (!) 164/86     ?Pulse: (!) 41     ?Resp: (!) 24     ?Temp:    (!) 97.1 ?F (36.2 ?C)  ?TempSrc:    Axillary  ?SpO2: 98% 96%    ?Weight:    107.3 kg  ?Height:   _0  (1.753 m)   ? ? ?Intake/Output Summary (Last 24 hours) at 08/17/21 0835 ?Last data filed at 2021/08/17 1740 ?Gross per 24 hour  ?Intake 1676.07 ml  ?Output 1075 ml  ?Net 601.07 ml  ? ?Filed Weights  ? 07/19/2021 1148 07/16/2021 1610 August 17, 2021 0531  ?Weight: 108.8 kg 103.3 kg 107.3 kg  ? ? ?Examination: ? ?General exam: Appears to be in severe distress on BiPAP ?Respiratory system: Currently on BiPAP with diminished to auscultation bilaterally ?Cardiovascular system: S1 & S2 heard, irregular and tachycardic ?Gastrointestinal system: Abdomen is soft ?Central nervous system: Alert and awake ?Extremities: No edema ?Skin: No significant lesions noted ?Psychiatry: Flat affect. ?Condom catheter with clear, yellow urine output ? ? ? ?Data Reviewed: I have personally reviewed following  labs and imaging studies ? ?CBC: ?Recent Labs  ?Lab 08/14/2021 ?1155 08/01/21 ?0424 08/17/2021 ?0433  ?WBC 26.0* 24.3* 34.9*  ?NEUTROABS 24.2* 23.3* 33.5*  ?HGB 15.5 12.9* 14.1  ?HCT 49.0 42.4 44.4  ?MCV 91.2 91.2 91.9  ?PLT 235 179 144*  ? ?Basic Metabolic Panel: ?Recent Labs  ?Lab 08/12/2021 ?1155 08/01/21 ?0424 08/17/21 ?0433  ?NA 139 140 145  ?K 4.2 3.9 4.1  ?CL 102 109 110  ?CO2 20* 19* 18*  ?GLUCOSE 151* 200* 168*  ?BUN 70* 69* 75*  ?CREATININE 3.93* 3.34* 2.99*  ?CALCIUM 8.0* 7.5* 8.2*  ?MG  --  2.4 2.8*  ? ?GFR: ?Estimated Creatinine Clearance: 23.8 mL/min (A) (by C-G formula based on SCr of 2.99 mg/dL (H)). ?Liver Function Tests: ?Recent Labs  ?Lab 08/04/2021 ?1155 08/01/21 ?0424 2021-08-17 ?0433  ?AST 56* 66* 93*  ?ALT 37 34 39  ?ALKPHOS 103 90 145*  ?BILITOT 1.1 0.4 0.3  ?PROT 7.1 5.5* 5.9*  ?ALBUMIN 2.8* 2.1* 2.2*  ? ?No results for input(s): LIPASE, AMYLASE in the last  168 hours. ?No results for input(s): AMMONIA in the last 168 hours. ?Coagulation Profile: ?No results for input(s): INR, PROTIME in the last 168 hours. ?Cardiac Enzymes: ?No results for input(s): CKTOTAL, CKMB, CKMBINDEX, TROPONINI in the last 168 hours. ?BNP (last 3 results) ?No results for input(s): PROBNP in the last 8760 hours. ?HbA1C: ?No results for input(s): HGBA1C in the last 72 hours. ?CBG: ?No results for input(s): GLUCAP in the last 168 hours. ?Lipid Profile: ?Recent Labs  ?  08/09/2021 ?1155  ?TRIG 127  ? ?Thyroid Function Tests: ?No results for input(s): TSH, T4TOTAL, FREET4, T3FREE, THYROIDAB in the last 72 hours. ?Anemia Panel: ?Recent Labs  ?  07/20/2021 ?1155 08/01/21 ?0425  ?FERRITIN 213 187  ? ?Sepsis Labs: ?Recent Labs  ?Lab 07/17/2021 ?1155 08/06/2021 ?1316 08/01/21 ?0425 Aug 28, 2021 ?0434  ?PROCALCITON 1.74  --   --   --   ?LATICACIDVEN 4.1* 3.9* 4.3* 4.2*  ? ? ?Recent Results (from the past 240 hour(s))  ?Resp Panel by RT-PCR (Flu A&B, Covid) Nasopharyngeal Swab     Status: Abnormal  ? Collection Time: 08/01/2021 11:55 AM  ? Specimen:  Nasopharyngeal Swab; Nasopharyngeal(NP) swabs in vial transport medium  ?Result Value Ref Range Status  ? SARS Coronavirus 2 by RT PCR POSITIVE (A) NEGATIVE Final  ?  Comment: (NOTE) ?SARS-CoV-2 target nucleic

## 2021-08-15 NOTE — Progress Notes (Signed)
VASCULAR LAB ? ? ? ?Bilateral lower extremity venous duplex has been performed. ? ?See CV proc for preliminary results. ? ? ?Kester Stimpson, RVT ?Aug 13, 2021, 6:09 PM ? ?

## 2021-08-15 NOTE — ED Provider Notes (Addendum)
Procedure Name: Intubation ?Date/Time: 08/22/21 8:00 AM ?Performed by: Godfrey Pick, MD ?Pre-anesthesia Checklist: Patient being monitored, Patient identified, Timeout performed and Suction available ?Preoxygenation: Pre-oxygenation with 100% oxygen ?Induction Type: Rapid sequence ?Ventilation: Mask ventilation without difficulty ?Laryngoscope Size: Glidescope and 4 ?Grade View: Grade I ?Tube size: 7.5 mm ?Number of attempts: 1 ?Airway Equipment and Method: Patient positioned with wedge pillow, Rigid stylet and Video-laryngoscopy ?Secured at: 27 cm ?Tube secured with: ETT holder ?Dental Injury: Teeth and Oropharynx as per pre-operative assessment  ? ? ? ?I was called from the ED by ICU for need of intubation.  This patient has been admitted for hypoxic respiratory failure.  He is COVID-positive.  Currently he is on BiPAP.  ICU attending, Dr. Manuella Ghazi, requested intubation.  On arrival at bedside, patient remains on BiPAP.  He is awake and alert.  CODE STATUS was confirmed.  Vital signs notable for tachycardia and hypertension.  Etomidate and rocuronium were given for RSI.  On visualization of glottic area, patient had dry mucosal membranes with a dark tarry substance present.  Patient was intubated as per procedure note above. ? ?  ? ?  ?Godfrey Pick, MD ?August 22, [phone] ? ?

## 2021-08-15 NOTE — Progress Notes (Signed)
2207: Hospitalist notified of persistent tachycardia despite metoprolol IV push, tachypnea in the 30-40s with increased work of breathing, complaint of numbness below R knee.  Patient able to move his leg and hold it off the bed without drift. Requested morphine for pain and work of breathing.  ? ? ? ? 2300: Requested cardizem for persistent tachycardia. Notified of RR in 30-40s with increasing oxygen needs.  FiO2 has been increased from 70% to 90% and PEEP increased as well. Requesting duoneb to be switched to xopenex to avoid albuterol with tachycardia. ? ? ? ?0100: CXR completed.  ? ? ?0500: Notified of ABG results. Requested upgrading patient to ICU level of care.  ? ? ?0600: Patient requesting something for pain at this time.  ? ?0630: Notified of lactic acid 4.2.  ? ? ? ?

## 2021-08-15 NOTE — Progress Notes (Signed)
1015:  Patient's blood pressure continues to rise, MD notified, order received for PRN labetalol.  ? ?1040: Upon assessment pt continues to be unresponsive to stimuli post intubation. Pupils reactive. MD notified. No new orders at this time. [facility] at bedside preparing pt for transport.  ? ?1115: Report called to RN at [facility]. ?

## 2021-08-15 NOTE — Progress Notes (Signed)
PCCM Note ? ?Lower extremity ultrasound preliminary report shows bilateral DVTs and no arterial blood flow to the lower extremities.  He does not have dopplerable femorals or lower extremity pulse. ? ?Discussed with Dr. Stanford Breed, Vascular surgery.  Treatment options are poor with up to 100% mortality.   ? ?I called wife to discuss but could not get through to her.   ?Continue supportive care and heparin anticoagulation for now. ?We will need to address goals of care when able ? ?Marshell Garfinkel MD ?[facility] ?2021/08/04, 6:27 PM  ? ? ?

## 2021-08-15 NOTE — Progress Notes (Signed)
VASCULAR LAB ? ? ? ?Bilateral lower extremity arterial duplex has been performed. ? ?See CV proc for preliminary results. ? ? ?Drake Wuertz, RVT ?2021/08/15, 6:10 PM ? ?

## 2021-08-15 DEATH — deceased

## 2021-08-17 ENCOUNTER — Ambulatory Visit: Payer: PPO | Admitting: Podiatry

## 2022-01-05 ENCOUNTER — Ambulatory Visit: Payer: PPO | Admitting: Internal Medicine

## 2022-01-26 ENCOUNTER — Ambulatory Visit: Payer: PPO | Admitting: Cardiology
